# Patient Record
Sex: Female | Born: 1940 | Race: White | Hispanic: No | Marital: Married | Smoking: Never smoker
Health system: Southern US, Community
[De-identification: ages and names within clinical notes are randomized; demographics above are authoritative.]

## PROBLEM LIST (undated history)

## (undated) DIAGNOSIS — C801 Malignant (primary) neoplasm, unspecified: Secondary | ICD-10-CM

## (undated) DIAGNOSIS — H409 Unspecified glaucoma: Secondary | ICD-10-CM

## (undated) DIAGNOSIS — I639 Cerebral infarction, unspecified: Secondary | ICD-10-CM

## (undated) DIAGNOSIS — I10 Essential (primary) hypertension: Secondary | ICD-10-CM

## (undated) DIAGNOSIS — M8580 Other specified disorders of bone density and structure, unspecified site: Secondary | ICD-10-CM

## (undated) HISTORY — PX: TONSILLECTOMY: SUR1361

## (undated) HISTORY — DX: Other specified disorders of bone density and structure, unspecified site: M85.80

## (undated) HISTORY — PX: JOINT REPLACEMENT: SHX530

## (undated) HISTORY — PX: BREAST BIOPSY: SHX20

## (undated) HISTORY — DX: Unspecified glaucoma: H40.9

---

## 2004-01-24 ENCOUNTER — Ambulatory Visit: Payer: Self-pay | Admitting: Surgery

## 2006-08-05 ENCOUNTER — Other Ambulatory Visit: Payer: Self-pay

## 2006-08-06 ENCOUNTER — Inpatient Hospital Stay: Payer: Self-pay | Admitting: Internal Medicine

## 2007-07-14 ENCOUNTER — Ambulatory Visit: Payer: Self-pay | Admitting: Internal Medicine

## 2008-01-31 ENCOUNTER — Ambulatory Visit: Payer: Self-pay | Admitting: Gastroenterology

## 2009-01-11 ENCOUNTER — Ambulatory Visit: Payer: Self-pay | Admitting: Internal Medicine

## 2010-01-23 ENCOUNTER — Ambulatory Visit: Payer: Self-pay | Admitting: Internal Medicine

## 2011-02-27 ENCOUNTER — Ambulatory Visit: Payer: Self-pay | Admitting: Internal Medicine

## 2012-03-01 ENCOUNTER — Ambulatory Visit: Payer: Self-pay | Admitting: Internal Medicine

## 2013-03-02 ENCOUNTER — Ambulatory Visit: Payer: Self-pay | Admitting: Internal Medicine

## 2013-04-04 ENCOUNTER — Ambulatory Visit: Payer: Self-pay | Admitting: Gastroenterology

## 2014-03-03 ENCOUNTER — Ambulatory Visit: Payer: Self-pay | Admitting: Internal Medicine

## 2015-01-26 ENCOUNTER — Other Ambulatory Visit: Payer: Self-pay | Admitting: Internal Medicine

## 2015-01-26 DIAGNOSIS — Z1231 Encounter for screening mammogram for malignant neoplasm of breast: Secondary | ICD-10-CM

## 2015-03-05 ENCOUNTER — Ambulatory Visit
Admission: RE | Admit: 2015-03-05 | Discharge: 2015-03-05 | Disposition: A | Discharge status: Home / Self Care | Payer: Medicare Other | Source: Ambulatory Visit | Attending: Internal Medicine | Admitting: Internal Medicine

## 2015-03-05 ENCOUNTER — Other Ambulatory Visit: Payer: Self-pay | Admitting: Internal Medicine

## 2015-03-05 DIAGNOSIS — Z1231 Encounter for screening mammogram for malignant neoplasm of breast: Secondary | ICD-10-CM | POA: Diagnosis present

## 2015-07-26 DIAGNOSIS — Z8673 Personal history of transient ischemic attack (TIA), and cerebral infarction without residual deficits: Secondary | ICD-10-CM | POA: Insufficient documentation

## 2016-01-01 ENCOUNTER — Emergency Department: Payer: Medicare Other

## 2016-01-01 ENCOUNTER — Inpatient Hospital Stay
Admission: EM | Admit: 2016-01-01 | Discharge: 2016-01-05 | DRG: 470 | Disposition: A | Discharge status: Skilled Nursing Facility | Payer: Medicare Other | Attending: Internal Medicine | Admitting: Internal Medicine

## 2016-01-01 DIAGNOSIS — E876 Hypokalemia: Secondary | ICD-10-CM | POA: Diagnosis present

## 2016-01-01 DIAGNOSIS — S72002A Fracture of unspecified part of neck of left femur, initial encounter for closed fracture: Secondary | ICD-10-CM | POA: Diagnosis present

## 2016-01-01 DIAGNOSIS — Z96649 Presence of unspecified artificial hip joint: Secondary | ICD-10-CM

## 2016-01-01 DIAGNOSIS — W19XXXA Unspecified fall, initial encounter: Secondary | ICD-10-CM

## 2016-01-01 DIAGNOSIS — Z7982 Long term (current) use of aspirin: Secondary | ICD-10-CM

## 2016-01-01 DIAGNOSIS — Y9301 Activity, walking, marching and hiking: Secondary | ICD-10-CM | POA: Diagnosis present

## 2016-01-01 DIAGNOSIS — R262 Difficulty in walking, not elsewhere classified: Secondary | ICD-10-CM

## 2016-01-01 DIAGNOSIS — Y9289 Other specified places as the place of occurrence of the external cause: Secondary | ICD-10-CM | POA: Diagnosis not present

## 2016-01-01 DIAGNOSIS — M6281 Muscle weakness (generalized): Secondary | ICD-10-CM

## 2016-01-01 DIAGNOSIS — E785 Hyperlipidemia, unspecified: Secondary | ICD-10-CM | POA: Diagnosis present

## 2016-01-01 DIAGNOSIS — Z8673 Personal history of transient ischemic attack (TIA), and cerebral infarction without residual deficits: Secondary | ICD-10-CM | POA: Diagnosis not present

## 2016-01-01 DIAGNOSIS — W1830XA Fall on same level, unspecified, initial encounter: Secondary | ICD-10-CM | POA: Diagnosis present

## 2016-01-01 DIAGNOSIS — Z79899 Other long term (current) drug therapy: Secondary | ICD-10-CM | POA: Diagnosis not present

## 2016-01-01 DIAGNOSIS — M25552 Pain in left hip: Secondary | ICD-10-CM

## 2016-01-01 DIAGNOSIS — I10 Essential (primary) hypertension: Secondary | ICD-10-CM | POA: Diagnosis present

## 2016-01-01 HISTORY — DX: Cerebral infarction, unspecified: I63.9

## 2016-01-01 LAB — BASIC METABOLIC PANEL
Anion gap: 12 (ref 5–15)
BUN: 16 mg/dL (ref 6–20)
CO2: 26 mmol/L (ref 22–32)
Calcium: 9.4 mg/dL (ref 8.9–10.3)
Chloride: 100 mmol/L — ABNORMAL LOW (ref 101–111)
Creatinine, Ser: 0.67 mg/dL (ref 0.44–1.00)
Glucose, Bld: 127 mg/dL — ABNORMAL HIGH (ref 65–99)
POTASSIUM: 3 mmol/L — AB (ref 3.5–5.1)
SODIUM: 138 mmol/L (ref 135–145)

## 2016-01-01 LAB — CBC
HCT: 42.2 % (ref 35.0–47.0)
Hemoglobin: 14.4 g/dL (ref 12.0–16.0)
MCH: 32.2 pg (ref 26.0–34.0)
MCHC: 34.2 g/dL (ref 32.0–36.0)
MCV: 94.1 fL (ref 80.0–100.0)
PLATELETS: 207 10*3/uL (ref 150–440)
RBC: 4.49 MIL/uL (ref 3.80–5.20)
RDW: 12.8 % (ref 11.5–14.5)
WBC: 16.7 10*3/uL — AB (ref 3.6–11.0)

## 2016-01-01 LAB — CK: CK TOTAL: 84 U/L (ref 38–234)

## 2016-01-01 MED ORDER — ONDANSETRON HCL 4 MG/2ML IJ SOLN: 4.0000 mg | Freq: Four times a day (QID) | INTRAMUSCULAR | Status: DC | PRN

## 2016-01-01 MED ORDER — MORPHINE SULFATE (PF) 2 MG/ML IV SOLN: 2.0000 mg | INTRAVENOUS | Status: DC | PRN

## 2016-01-01 MED ORDER — AMLODIPINE BESYLATE 10 MG PO TABS
10.0000 mg | ORAL_TABLET | Freq: Every day | ORAL | Status: DC
  Administered 2016-01-02 – 2016-01-05 (×3): 10 mg via ORAL
  Filled 2016-01-01 (×4): qty 1

## 2016-01-01 MED ORDER — VITAMIN E 180 MG (400 UNIT) PO CAPS
400.0000 [IU] | ORAL_CAPSULE | Freq: Every day | ORAL | Status: DC
  Administered 2016-01-03 – 2016-01-05 (×3): 400 [IU] via ORAL
  Filled 2016-01-01 (×4): qty 1

## 2016-01-01 MED ORDER — ACETAMINOPHEN 650 MG RE SUPP: 650.0000 mg | Freq: Four times a day (QID) | RECTAL | Status: DC | PRN

## 2016-01-01 MED ORDER — PRAVASTATIN SODIUM 20 MG PO TABS
20.0000 mg | ORAL_TABLET | Freq: Every day | ORAL | Status: DC
  Administered 2016-01-02 – 2016-01-05 (×4): 20 mg via ORAL
  Filled 2016-01-01 (×4): qty 1

## 2016-01-01 MED ORDER — ZOLPIDEM TARTRATE 5 MG PO TABS: 5.0000 mg | ORAL_TABLET | Freq: Every evening | ORAL | Status: DC | PRN

## 2016-01-01 MED ORDER — HYDROCHLOROTHIAZIDE 25 MG PO TABS
25.0000 mg | ORAL_TABLET | Freq: Every day | ORAL | Status: DC
  Administered 2016-01-02 – 2016-01-05 (×4): 25 mg via ORAL
  Filled 2016-01-01 (×4): qty 1

## 2016-01-01 MED ORDER — SODIUM CHLORIDE 0.9 % IV SOLN
Freq: Once | INTRAVENOUS | Status: AC
  Administered 2016-01-01: 22:00:00 via INTRAVENOUS

## 2016-01-01 MED ORDER — TIMOLOL HEMIHYDRATE 0.25 % OP SOLN
1.0000 [drp] | Freq: Every day | OPHTHALMIC | Status: DC
  Administered 2016-01-02: 1 [drp] via OPHTHALMIC
  Filled 2016-01-01 (×2): qty 5

## 2016-01-01 MED ORDER — OXYCODONE HCL 5 MG PO TABS: 5.0000 mg | ORAL_TABLET | ORAL | Status: DC | PRN

## 2016-01-01 MED ORDER — FENTANYL CITRATE (PF) 100 MCG/2ML IJ SOLN
50.0000 ug | Freq: Once | INTRAMUSCULAR | Status: AC
  Administered 2016-01-01: 50 ug via INTRAVENOUS
  Filled 2016-01-01: qty 2

## 2016-01-01 MED ORDER — MORPHINE SULFATE (PF) 2 MG/ML IV SOLN
2.0000 mg | INTRAVENOUS | Status: DC | PRN
  Administered 2016-01-01 – 2016-01-02 (×4): 2 mg via INTRAVENOUS
  Filled 2016-01-01 (×4): qty 1

## 2016-01-01 MED ORDER — POTASSIUM CHLORIDE IN NACL 40-0.9 MEQ/L-% IV SOLN
INTRAVENOUS | Status: DC
  Administered 2016-01-01 – 2016-01-02 (×2): 75 mL/h via INTRAVENOUS
  Filled 2016-01-01 (×4): qty 1000

## 2016-01-01 MED ORDER — COQ10 100 MG PO CAPS: 100.0000 mg | ORAL_CAPSULE | Freq: Every day | ORAL | Status: DC

## 2016-01-01 MED ORDER — SENNOSIDES-DOCUSATE SODIUM 8.6-50 MG PO TABS: 1.0000 | ORAL_TABLET | Freq: Every evening | ORAL | Status: DC | PRN

## 2016-01-01 MED ORDER — ADULT MULTIVITAMIN W/MINERALS CH
1.0000 | ORAL_TABLET | Freq: Every day | ORAL | Status: DC
  Administered 2016-01-03 – 2016-01-05 (×3): 1 via ORAL
  Filled 2016-01-01 (×4): qty 1

## 2016-01-01 MED ORDER — MORPHINE SULFATE (PF) 2 MG/ML IV SOLN
INTRAVENOUS | Status: AC
  Administered 2016-01-02: 2 mg via INTRAVENOUS
  Filled 2016-01-01: qty 1

## 2016-01-01 MED ORDER — POTASSIUM CHLORIDE CRYS ER 20 MEQ PO TBCR
40.0000 meq | EXTENDED_RELEASE_TABLET | Freq: Once | ORAL | Status: AC
  Administered 2016-01-01: 40 meq via ORAL
  Filled 2016-01-01: qty 2

## 2016-01-01 MED ORDER — BISACODYL 5 MG PO TBEC: 5.0000 mg | DELAYED_RELEASE_TABLET | Freq: Every day | ORAL | Status: DC | PRN

## 2016-01-01 MED ORDER — LATANOPROST 0.005 % OP SOLN
1.0000 [drp] | Freq: Every day | OPHTHALMIC | Status: DC
  Administered 2016-01-01 – 2016-01-04 (×3): 1 [drp] via OPHTHALMIC
  Filled 2016-01-01 (×2): qty 2.5

## 2016-01-01 MED ORDER — HYDROCODONE-ACETAMINOPHEN 5-325 MG PO TABS: 1.0000 | ORAL_TABLET | ORAL | Status: DC | PRN

## 2016-01-01 MED ORDER — ACETAMINOPHEN 325 MG PO TABS: 650.0000 mg | ORAL_TABLET | Freq: Four times a day (QID) | ORAL | Status: DC | PRN

## 2016-01-01 MED ORDER — HEPARIN SODIUM (PORCINE) 5000 UNIT/ML IJ SOLN: 5000.0000 [IU] | Freq: Three times a day (TID) | INTRAMUSCULAR | Status: DC

## 2016-01-01 MED ORDER — ONDANSETRON HCL 4 MG PO TABS: 4.0000 mg | ORAL_TABLET | Freq: Four times a day (QID) | ORAL | Status: DC | PRN

## 2016-01-01 MED ORDER — CEFAZOLIN SODIUM-DEXTROSE 2-4 GM/100ML-% IV SOLN
2.0000 g | INTRAVENOUS | Status: AC
  Administered 2016-01-02: 2 g via INTRAVENOUS
  Filled 2016-01-01: qty 100

## 2016-01-01 MED ORDER — MAGNESIUM CITRATE PO SOLN
1.0000 | Freq: Once | ORAL | Status: DC | PRN
  Filled 2016-01-01: qty 296

## 2016-01-01 NOTE — Consult Note (Signed)
ORTHOPAEDIC CONSULTATION  PATIENT NAME: Melissa [name] DOB: 05-19-1940  MRN: [id number]  REQUESTING PHYSICIAN: No att. providers found  Chief Complaint: Left hip pain  HPI: Melissa [name] is a 75 y.o. female who complains of  left hip pain. The patient is moving flowers into her garage when she slipped and fell, landing on her left hip and side. She was unable stand or bear weight due to the left hip pain. She denied any loss of consciousness. She denied any other injuries. She was apparently on the floor of the broad she for several hours before being found by her husband. Prior to the fall she was an independent ambulator.  Past Medical History:  Diagnosis Date  . Stroke [facility])    Past Surgical History:  Procedure Laterality Date  . BREAST BIOPSY Right    core- stereo- neg   Social History   Social History  . Marital status: Married    Spouse name: N/A  . Number of children: N/A  . Years of education: N/A   Social History Main Topics  . Smoking status: Never Smoker  . Smokeless tobacco: Never Used  . Alcohol use No  . Drug use: Unknown  . Sexual activity: Not Asked   Other Topics Concern  . None   Social History Narrative  . None   Family History  Problem Relation Age of Onset  . Breast cancer Maternal Aunt    No Known Allergies Prior to Admission medications   Medication Sig Start Date End Date Taking? Authorizing Provider  amLODipine (NORVASC) 10 MG tablet Take 10 mg by mouth daily.   Yes Historical Provider, MD  aspirin EC 325 MG tablet Take 325 mg by mouth daily.   Yes Historical Provider, MD  Coenzyme Q10 (COQ10) 100 MG CAPS Take 100 mg by mouth daily.   Yes Historical Provider, MD  hydrochlorothiazide (HYDRODIURIL) 25 MG tablet Take 25 mg by mouth daily.   Yes Historical Provider, MD  latanoprost (XALATAN) 0.005 % ophthalmic solution Place 1 drop into both eyes at bedtime.   Yes Historical Provider, MD  Multiple Vitamin (MULTIVITAMIN) tablet Take 1  tablet by mouth daily.   Yes Historical Provider, MD  pravastatin (PRAVACHOL) 20 MG tablet Take 20 mg by mouth daily.   Yes Historical Provider, MD  timolol (BETIMOL) 0.25 % ophthalmic solution Place 1 drop into both eyes daily.   Yes Historical Provider, MD  triamcinolone cream (KENALOG) 0.5 % Apply 1 application topically 2 (two) times daily.   Yes Historical Provider, MD  vitamin E 400 UNIT capsule Take 400 Units by mouth daily.   Yes Historical Provider, MD   Ct Head Wo Contrast  Result Date: 01/01/2016 CLINICAL DATA:  Status post fall. EXAM: CT HEAD WITHOUT CONTRAST CT CERVICAL SPINE WITHOUT CONTRAST TECHNIQUE: Multidetector CT imaging of the head and cervical spine was performed following the standard protocol without intravenous contrast. Multiplanar CT image reconstructions of the cervical spine were also generated. COMPARISON:  Brain MRI 08/06/2006 FINDINGS: CT HEAD FINDINGS Brain: No mass lesion, intraparenchymal hemorrhage or extra-axial collection. No evidence of acute cortical infarct. 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 Vascular: No hyperdense vessel or unexpected calcification. Skull: Normal visualized skull base, calvarium and extracranial soft tissues. Sinuses/Orbits: No sinus fluid levels or advanced mucosal thickening. No mastoid effusion. Normal orbits. CT CERVICAL SPINE FINDINGS Alignment: No static subluxation. Facets are aligned. Occipital condyles are normally positioned. Skull base and vertebrae: No acute fracture. Soft tissues and spinal canal: No prevertebral fluid  or swelling. No visible canal hematoma. Disc levels: No advanced spinal canal stenosis. There is multilevel severe facet hypertrophy without obvious severe foraminal stenosis. Upper chest: No pneumothorax, pulmonary nodule or pleural effusion. Other: Normal visualized paraspinal cervical soft tissues. IMPRESSION: 1. No acute intracranial abnormality. Old left thalamic lacunar infarct. 2. No acute fracture or static  subluxation of the cervical spine. Electronically Signed   By: Ulyses Jarred M.D.   On: 01/01/2016 20:59   Ct Cervical Spine Wo Contrast  Result Date: 01/01/2016 CLINICAL DATA:  Status post fall. EXAM: CT HEAD WITHOUT CONTRAST CT CERVICAL SPINE WITHOUT CONTRAST TECHNIQUE: Multidetector CT imaging of the head and cervical spine was performed following the standard protocol without intravenous contrast. Multiplanar CT image reconstructions of the cervical spine were also generated. COMPARISON:  Brain MRI 08/06/2006 FINDINGS: CT HEAD FINDINGS Brain: No mass lesion, intraparenchymal hemorrhage or extra-axial collection. No evidence of acute cortical infarct. 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 Vascular: No hyperdense vessel or unexpected calcification. Skull: Normal visualized skull base, calvarium and extracranial soft tissues. Sinuses/Orbits: No sinus fluid levels or advanced mucosal thickening. No mastoid effusion. Normal orbits. CT CERVICAL SPINE FINDINGS Alignment: No static subluxation. Facets are aligned. Occipital condyles are normally positioned. Skull base and vertebrae: No acute fracture. Soft tissues and spinal canal: No prevertebral fluid or swelling. No visible canal hematoma. Disc levels: No advanced spinal canal stenosis. There is multilevel severe facet hypertrophy without obvious severe foraminal stenosis. Upper chest: No pneumothorax, pulmonary nodule or pleural effusion. Other: Normal visualized paraspinal cervical soft tissues. IMPRESSION: 1. No acute intracranial abnormality. Old left thalamic lacunar infarct. 2. No acute fracture or static subluxation of the cervical spine. Electronically Signed   By: Ulyses Jarred M.D.   On: 01/01/2016 20:59   Dg Chest Portable 1 View  Result Date: 01/01/2016 CLINICAL DATA:  [place] with left proximal femur pain. Left hip fracture. EXAM: PORTABLE CHEST 1 VIEW COMPARISON:  08/05/2006 FINDINGS: Both lungs are clear. Heart and mediastinum are within normal  limits. Trachea is midline. Negative for a pneumothorax. No acute bone abnormality in the chest. IMPRESSION: No acute chest abnormality. Electronically Signed   By: Markus Daft M.D.   On: 01/01/2016 20:53   Dg Hip Unilat W Or Wo Pelvis 2-3 Views Left  Result Date: 01/01/2016 CLINICAL DATA:  Status post fall EXAM: DG HIP (WITH OR WITHOUT PELVIS) 2-3V LEFT COMPARISON:  None. FINDINGS: There is a fracture of the left femoral neck with superior displacement of the femur relative to the femoral head. The femoral head remains within the acetabular cup. The remainder of the visualized pelvis is unremarkable. IMPRESSION: Acute fracture of the left femoral neck with approximately 2 cm of overriding. Electronically Signed   By: Ulyses Jarred M.D.   On: 01/01/2016 21:05    Positive ROS: All other systems have been reviewed and were otherwise negative with the exception of those mentioned in the HPI and as above.  Physical Exam: General: Alert and alert in no acute distress. HEENT: Atraumatic and normocephalic. Sclera are clear. Extraocular motion is intact. Oropharynx is clear with moist mucosa. Neck: Supple, nontender, good range of motion. No JVD or carotid bruits. Lungs: Clear to auscultation bilaterally. Cardiovascular: Regular rate and rhythm with normal S1 and S2. No murmurs. No gallops or rubs. Pedal pulses are palpable bilaterally. Homans test is negative bilaterally. No significant pretibial or ankle edema. Abdomen: Soft, nontender, and nondistended. Bowel sounds are present. Skin: No lesions in the area of chief complaint Neurologic:  Awake, alert, and oriented. Sensory function is grossly intact. Motor strength is felt to be 5 over 5 bilaterally. No clonus or tremor. Good motor coordination. Lymphatic: No axillary or cervical lymphadenopathy  MUSCULOSKELETAL: Semination the upper extremities demonstrates no focal tenderness to palpation, good range of motion shoulders, elbows, and wrist. Good  upper body strength is noted. Pertinent examination of the lower extremities so for shortening and rotation of the left lower extremity. Hip pain is reproduced by any attempt at range of motion of the left hip. No point tenderness or effusion of the left knee.  Assessment: Displaced left femoral neck fracture Hypokalemia  Plan: Recommendations made for left hip hemiarthroplasty. The findings were discussed in detail with the patient. The usual perioperative course was discussed. The risks and benefits of surgical intervention were reviewed. The patient expressed understanding of the risks and benefits and agreed with plans for surgical intervention.   The surgical site was signed as per the "right site surgery" protocol.   Optimization for surgery as per Medicine.  James P. Holley Bouche M.D.

## 2016-01-01 NOTE — ED Notes (Signed)
Pt arrived via ems with c/o fall - pt was in garage bringing flowers in - pt states she lost her balance and fell and has been laying on the garage floor since 4pm - c/o left leg pain - left leg is noted to be slightly shortened and rotated outwards

## 2016-01-01 NOTE — Progress Notes (Signed)
PHARMACIST - PHYSICIAN ORDER COMMUNICATION  CONCERNING: P&T Medication Policy on Herbal Medications  DESCRIPTION:  This patient's order for:  Co-Q-10  has been noted.  This product(s) is classified as an "herbal" or natural product. Due to a lack of definitive safety studies or FDA approval, nonstandard manufacturing practices, plus the potential risk of unknown drug-drug interactions while on inpatient medications, the Pharmacy and Therapeutics Committee does not permit the use of "herbal" or natural products of this type within [facility].   ACTION TAKEN: The pharmacy department is unable to verify this order at this time. Please reevaluate patient's clinical condition at discharge and address if the herbal or natural product(s) should be resumed at that time.   

## 2016-01-01 NOTE — ED Notes (Signed)
Transporting patient to 144-1A

## 2016-01-01 NOTE — ED Triage Notes (Signed)
Pt arrived via ems with c/o fall - pt was in garage bringing flowers in - pt states she lost her balance and fell and has been laying on the garage floor since 4pm - c/o left leg pain - left leg is noted to be slightly shortened and rotated outwards

## 2016-01-01 NOTE — ED Notes (Signed)
37F foley inserted without difficulty - foley adhesive device in place to hold foley in place

## 2016-01-01 NOTE — ED Provider Notes (Signed)
[facility] Emergency Department Provider Note  ____________________________________________  Time seen: Approximately 7:59 PM  I have reviewed the triage vital signs and the nursing notes.   HISTORY  Chief Complaint Fall   HPI Melissa [name] is a 75 y.o. female with a history of stroke on aspirin only, hypertension, hyperlipidemia who presents for evaluation of fall. Patient was bringing flowers from her driveway into her garage and she lost her balance and fell. She fell to her left side. Patient developed severe pain in her left proximal femur and was unable to stand up. Patient reports that she was on the ground for 3 hours and to her husband found her. She denies head trauma but is unsure if she passed out after the fall. Patient is complaining of severe left-sided hip pain worse with movement. She denies headache, neck pain, back pain, chest pain, shortness of breath, abdominal pain.  Past Medical History:  Diagnosis Date  . Stroke [facility])     Patient Active Problem List   Diagnosis Date Noted  . Closed left hip fracture ([place]) 01/01/2016    Past Surgical History:  Procedure Laterality Date  . BREAST BIOPSY Right    core- stereo- neg    Prior to Admission medications   Medication Sig Start Date End Date Taking? Authorizing Provider  amLODipine (NORVASC) 10 MG tablet Take 10 mg by mouth daily.   Yes Historical Provider, MD  aspirin EC 325 MG tablet Take 325 mg by mouth daily.   Yes Historical Provider, MD  Coenzyme Q10 (COQ10) 100 MG CAPS Take 100 mg by mouth daily.   Yes Historical Provider, MD  hydrochlorothiazide (HYDRODIURIL) 25 MG tablet Take 25 mg by mouth daily.   Yes Historical Provider, MD  latanoprost (XALATAN) 0.005 % ophthalmic solution Place 1 drop into both eyes at bedtime.   Yes Historical Provider, MD  Multiple Vitamin (MULTIVITAMIN) tablet Take 1 tablet by mouth daily.   Yes Historical Provider, MD  pravastatin (PRAVACHOL) 20  MG tablet Take 20 mg by mouth daily.   Yes Historical Provider, MD  timolol (BETIMOL) 0.25 % ophthalmic solution Place 1 drop into both eyes daily.   Yes Historical Provider, MD  triamcinolone cream (KENALOG) 0.5 % Apply 1 application topically 2 (two) times daily.   Yes Historical Provider, MD  vitamin E 400 UNIT capsule Take 400 Units by mouth daily.   Yes Historical Provider, MD    Allergies Review of patient's allergies indicates no known allergies.  Family History  Problem Relation Age of Onset  . Breast cancer Maternal Aunt     Social History Social History  Substance Use Topics  . Smoking status: Never Smoker  . Smokeless tobacco: Never Used  . Alcohol use No    Review of Systems Constitutional: Negative for fever. Eyes: Negative for visual changes. ENT: Negative for facial injury or neck injury Cardiovascular: Negative for chest injury. Respiratory: Negative for shortness of breath. Negative for chest wall injury. Gastrointestinal: Negative for abdominal pain or injury. Genitourinary: Negative for dysuria. Musculoskeletal: Negative for back injury, + Left leg pain Skin: Negative for laceration/abrasions. Neurological: Negative for head injury.   ____________________________________________   PHYSICAL EXAM:  VITAL SIGNS: ED Triage Vitals [01/01/16 1945]  Enc Vitals Group     BP (!) 153/75     Pulse Rate 74     Resp 16     Temp 98 F (36.7 C)     Temp src      SpO2  100 %     Weight 156 lb (70.8 kg)     Height 5\' 2"  (1.575 m)     Head Circumference      Peak Flow      Pain Score 9     Pain Loc      Pain Edu?      Excl. in [place]?    Full spinal precautions maintained throughout the trauma exam. Constitutional: Alert and oriented. No acute distress. Does not appear intoxicated. HEENT Head: Normocephalic and atraumatic. Face: No facial bony tenderness. Stable midface Ears: No hemotympanum bilaterally. No Battle sign Eyes: No eye injury. PERRL. No  raccoon eyes Nose: Nontender. No epistaxis. No rhinorrhea Mouth/Throat: Mucous membranes are moist. No oropharyngeal blood. No dental injury. Airway patent without stridor. Normal voice. Neck: C-collar in place. No midline c-spine tenderness.  Cardiovascular: Normal rate, regular rhythm. Normal and symmetric distal pulses are present in all extremities. Pulmonary/Chest: Chest wall is stable and nontender to palpation/compression. Normal respiratory effort. Breath sounds are normal. No crepitus.  Abdominal: Soft, nontender, non distended. Musculoskeletal: Left leg is shortened and externally rotated. No thoracic or lumbar midline spinal tenderness. Pelvis is stable. Skin: Skin is warm, dry and intact. No abrasions or contutions. Psychiatric: Speech and behavior are appropriate. Neurological: Normal speech and language. Moves all extremities to command. No gross focal neurologic deficits are appreciated.  Glascow Coma Score: 4 - Opens eyes on own 6 - Follows simple motor commands 5 - Alert and oriented GCS: 15  ____________________________________________   LABS (all labs ordered are listed, but only abnormal results are displayed)  Labs Reviewed  BASIC METABOLIC PANEL - Abnormal; Notable for the following:       Result Value   Potassium 3.0 (*)    Chloride 100 (*)    Glucose, Bld 127 (*)    All other components within normal limits  SURGICAL PCR SCREEN  CK  BASIC METABOLIC PANEL  CBC  CBC  TYPE AND SCREEN   ____________________________________________  EKG  none ____________________________________________  RADIOLOGY  CT head c-spine: 1. No acute intracranial abnormality. Old left thalamic lacunar Infarct. 2. No acute fracture or static subluxation of the cervical spine.  XR L hip: Acute fracture of the left femoral neck with approximately 2 cm of Overriding.  CXR: No acute chest  abnormality ____________________________________________   PROCEDURES  Procedure(s) performed: None Procedures Critical Care performed:  None ____________________________________________   INITIAL IMPRESSION / ASSESSMENT AND PLAN / ED COURSE  75 y.o. female with a history of stroke on aspirin only, hypertension, hyperlipidemia who presents for evaluation of Left hip/leg pain after mechanical fall. Questionable LOC. Patient with a shortened and externally rotated leg concerning for possible fracture. We'll also check a BMP and total CK is patient was on the floor for 3 hours. We'll give her set no for the pain. Will pursue head CT and CT C-spine.  Clinical Course  Comment By Time  Patient found to have hip fracture. Patient will be admitted to the hospitalist. Discussed case with Dr. Marry Guan.  Rudene Re, MD 10/17 2150    Pertinent labs & imaging results that were available during my care of the patient were reviewed by me and considered in my medical decision making (see chart for details).    ____________________________________________   FINAL CLINICAL IMPRESSION(S) / ED DIAGNOSES  Final diagnoses:  Closed fracture of left hip, initial encounter ([place])  Fall, initial encounter      NEW MEDICATIONS STARTED DURING THIS VISIT:  Current Discharge Medication List       Note:  This document was prepared using Dragon voice recognition software and may include unintentional dictation errors.    Rudene Re, MD 01/01/16 2322

## 2016-01-02 ENCOUNTER — Inpatient Hospital Stay: Payer: Medicare Other | Admitting: Anesthesiology

## 2016-01-02 ENCOUNTER — Encounter
Admission: EM | Disposition: A | Discharge status: Skilled Nursing Facility | Payer: Self-pay | Source: Home / Self Care | Attending: Internal Medicine

## 2016-01-02 ENCOUNTER — Encounter: Payer: Self-pay | Admitting: Anesthesiology

## 2016-01-02 HISTORY — PX: HIP ARTHROPLASTY: SHX981

## 2016-01-02 LAB — LIPID PANEL
CHOLESTEROL: 178 mg/dL (ref 0–200)
HDL: 48 mg/dL (ref >40)
LDL CALC: 97 mg/dL (ref 0–99)
TRIGLYCERIDES: 166 mg/dL — AB (ref <150)
Total CHOL/HDL Ratio: 3.7 RATIO
VLDL: 33 mg/dL (ref 0–40)

## 2016-01-02 LAB — BASIC METABOLIC PANEL
Anion gap: 8 (ref 5–15)
BUN: 14 mg/dL (ref 6–20)
CALCIUM: 9.1 mg/dL (ref 8.9–10.3)
CO2: 28 mmol/L (ref 22–32)
CREATININE: 0.53 mg/dL (ref 0.44–1.00)
Chloride: 104 mmol/L (ref 101–111)
Glucose, Bld: 126 mg/dL — ABNORMAL HIGH (ref 65–99)
Potassium: 3.6 mmol/L (ref 3.5–5.1)
SODIUM: 140 mmol/L (ref 135–145)

## 2016-01-02 LAB — CBC
HCT: 41.7 % (ref 35.0–47.0)
Hemoglobin: 14.4 g/dL (ref 12.0–16.0)
MCH: 32.7 pg (ref 26.0–34.0)
MCHC: 34.4 g/dL (ref 32.0–36.0)
MCV: 94.8 fL (ref 80.0–100.0)
PLATELETS: 204 10*3/uL (ref 150–440)
RBC: 4.4 MIL/uL (ref 3.80–5.20)
RDW: 12.7 % (ref 11.5–14.5)
WBC: 12.7 10*3/uL — AB (ref 3.6–11.0)

## 2016-01-02 LAB — SURGICAL PCR SCREEN
MRSA, PCR: NEGATIVE
Staphylococcus aureus: NEGATIVE

## 2016-01-02 SURGERY — HEMIARTHROPLASTY, HIP, DIRECT ANTERIOR APPROACH, FOR FRACTURE
Anesthesia: Spinal | Site: Hip | Laterality: Left | Wound class: Clean

## 2016-01-02 MED ORDER — ACETAMINOPHEN 10 MG/ML IV SOLN
INTRAVENOUS | Status: AC
  Filled 2016-01-02: qty 100

## 2016-01-02 MED ORDER — MIDAZOLAM HCL 5 MG/5ML IJ SOLN
INTRAMUSCULAR | Status: DC | PRN
  Administered 2016-01-02: 2 mg via INTRAVENOUS

## 2016-01-02 MED ORDER — PHENYLEPHRINE HCL 10 MG/ML IJ SOLN
INTRAMUSCULAR | Status: AC
  Filled 2016-01-02: qty 1

## 2016-01-02 MED ORDER — TETRACAINE HCL 1 % IJ SOLN
INTRAMUSCULAR | Status: DC | PRN
  Administered 2016-01-02: 10 mg via INTRASPINAL

## 2016-01-02 MED ORDER — NEOMYCIN-POLYMYXIN B GU 40-200000 IR SOLN
Status: AC
  Filled 2016-01-02: qty 20

## 2016-01-02 MED ORDER — SODIUM CHLORIDE 0.9 % IV SOLN
INTRAVENOUS | Status: DC | PRN
  Administered 2016-01-02: 0.333 ug/min via INTRAVENOUS

## 2016-01-02 MED ORDER — EPHEDRINE SULFATE 50 MG/ML IJ SOLN
INTRAMUSCULAR | Status: DC | PRN
  Administered 2016-01-02: 10 mg via INTRAVENOUS

## 2016-01-02 MED ORDER — NEOMYCIN-POLYMYXIN B GU 40-200000 IR SOLN
Status: DC | PRN
  Administered 2016-01-02: 16 mL

## 2016-01-02 MED ORDER — PROPOFOL 500 MG/50ML IV EMUL
INTRAVENOUS | Status: DC | PRN
  Administered 2016-01-02: 25 ug/kg/min via INTRAVENOUS

## 2016-01-02 MED ORDER — KETAMINE HCL 50 MG/ML IJ SOLN
INTRAMUSCULAR | Status: DC | PRN
  Administered 2016-01-02 (×2): 25 mg via INTRAMUSCULAR

## 2016-01-02 MED ORDER — PROPOFOL 10 MG/ML IV BOLUS
INTRAVENOUS | Status: DC | PRN
  Administered 2016-01-02 – 2016-01-03 (×2): 30 mg via INTRAVENOUS

## 2016-01-02 MED ORDER — BUPIVACAINE HCL (PF) 0.5 % IJ SOLN
INTRAMUSCULAR | Status: DC | PRN
  Administered 2016-01-02: 2 mL

## 2016-01-02 MED ORDER — TETRACAINE HCL 1 % IJ SOLN
INTRAMUSCULAR | Status: AC
  Filled 2016-01-02: qty 2

## 2016-01-02 MED ORDER — LACTATED RINGERS IV SOLN
INTRAVENOUS | Status: DC | PRN
  Administered 2016-01-02: 21:00:00 via INTRAVENOUS

## 2016-01-02 MED ORDER — POTASSIUM CHLORIDE CRYS ER 20 MEQ PO TBCR
40.0000 meq | EXTENDED_RELEASE_TABLET | Freq: Two times a day (BID) | ORAL | Status: AC
  Administered 2016-01-02 (×2): 40 meq via ORAL
  Filled 2016-01-02 (×2): qty 2

## 2016-01-02 SURGICAL SUPPLY — 57 items
BAG DECANTER FOR FLEXI CONT (MISCELLANEOUS) ×3 IMPLANT
BLADE SAW 1 (BLADE) ×3 IMPLANT
CANISTER SUCT 1200ML W/VALVE (MISCELLANEOUS) ×3 IMPLANT
CANISTER SUCT 3000ML (MISCELLANEOUS) ×6 IMPLANT
CAPT HIP HEMI 2 ×3 IMPLANT
CATH FOL LEG HOLDER (MISCELLANEOUS) IMPLANT
CEMENT HV SMART SET (Cement) ×6 IMPLANT
DRAPE INCISE IOBAN 66X60 STRL (DRAPES) ×3 IMPLANT
DRAPE SHEET LG 3/4 BI-LAMINATE (DRAPES) ×3 IMPLANT
DRAPE TABLE BACK 80X90 (DRAPES) ×3 IMPLANT
DRSG DERMACEA 8X12 NADH (GAUZE/BANDAGES/DRESSINGS) ×3 IMPLANT
DRSG OPSITE POSTOP 4X12 (GAUZE/BANDAGES/DRESSINGS) ×3 IMPLANT
DRSG OPSITE POSTOP 4X14 (GAUZE/BANDAGES/DRESSINGS) IMPLANT
DRSG TEGADERM 4X4.75 (GAUZE/BANDAGES/DRESSINGS) ×3 IMPLANT
DURAPREP 26ML APPLICATOR (WOUND CARE) ×6 IMPLANT
ELECT BLADE 6.5 EXT (BLADE) ×3 IMPLANT
ELECT CAUTERY BLADE 6.4 (BLADE) ×3 IMPLANT
ELECT REM PT RETURN 9FT ADLT (ELECTROSURGICAL) ×3
ELECTRODE REM PT RTRN 9FT ADLT (ELECTROSURGICAL) ×1 IMPLANT
GAUZE PACK 2X3YD (MISCELLANEOUS) ×3 IMPLANT
GLOVE BIO SURGEON STRL SZ8 (GLOVE) ×3 IMPLANT
GLOVE BIOGEL M STRL SZ7.5 (GLOVE) ×3 IMPLANT
GLOVE BIOGEL PI IND STRL 9 (GLOVE) IMPLANT
GLOVE BIOGEL PI INDICATOR 9 (GLOVE)
GLOVE INDICATOR 8.0 STRL GRN (GLOVE) ×6 IMPLANT
GOWN STRL REUS W/ TWL LRG LVL3 (GOWN DISPOSABLE) ×1 IMPLANT
GOWN STRL REUS W/TWL 2XL LVL3 (GOWN DISPOSABLE) ×3 IMPLANT
GOWN STRL REUS W/TWL LRG LVL3 (GOWN DISPOSABLE) ×2
HANDPIECE INTERPULSE COAX TIP (DISPOSABLE) ×2
HEMOVAC 400CC 10FR (MISCELLANEOUS) ×3 IMPLANT
HOOD PEEL AWAY FLYTE STAYCOOL (MISCELLANEOUS) ×3 IMPLANT
IV NS 100ML SINGLE PACK (IV SOLUTION) ×3 IMPLANT
KIT RM TURNOVER STRD PROC AR (KITS) ×3 IMPLANT
NDL SAFETY 18GX1.5 (NEEDLE) ×3 IMPLANT
NEEDLE FILTER BLUNT 18X 1/2SAF (NEEDLE) ×2
NEEDLE FILTER BLUNT 18X1 1/2 (NEEDLE) ×1 IMPLANT
NS IRRIG 1000ML POUR BTL (IV SOLUTION) ×3 IMPLANT
PACK HIP PROSTHESIS (MISCELLANEOUS) ×3 IMPLANT
PRESSURIZER CEMENT PROX FEM SM (MISCELLANEOUS) ×3 IMPLANT
PRESSURIZER FEM CANAL M (MISCELLANEOUS) ×3 IMPLANT
SET HNDPC FAN SPRY TIP SCT (DISPOSABLE) ×1 IMPLANT
SOL .9 NS 3000ML IRR  AL (IV SOLUTION) ×2
SOL .9 NS 3000ML IRR UROMATIC (IV SOLUTION) ×1 IMPLANT
SOL PREP PVP 2OZ (MISCELLANEOUS) ×3
SOLUTION PREP PVP 2OZ (MISCELLANEOUS) ×1 IMPLANT
SPONGE DRAIN TRACH 4X4 STRL 2S (GAUZE/BANDAGES/DRESSINGS) ×3 IMPLANT
STAPLER SKIN PROX 35W (STAPLE) ×3 IMPLANT
SUT ETHIBOND #5 BRAIDED 30INL (SUTURE) ×3 IMPLANT
SUT VIC AB 0 CT1 36 (SUTURE) ×3 IMPLANT
SUT VIC AB 1 CT1 36 (SUTURE) ×6 IMPLANT
SUT VIC AB 2-0 CT1 27 (SUTURE) ×2
SUT VIC AB 2-0 CT1 TAPERPNT 27 (SUTURE) ×1 IMPLANT
SYR 20CC LL (SYRINGE) ×3 IMPLANT
SYR TB 1ML LUER SLIP (SYRINGE) ×3 IMPLANT
TAPE TRANSPORE STRL 2 31045 (GAUZE/BANDAGES/DRESSINGS) ×3 IMPLANT
TIP COAXIAL FEMORAL CANAL (MISCELLANEOUS) ×3 IMPLANT
TOWER CARTRIDGE SMART MIX (DISPOSABLE) ×3 IMPLANT

## 2016-01-02 NOTE — Progress Notes (Signed)
CSW received consult for possible SNF placement. CSW is awaiting PT evaluation to be completed to determine the appropriate level of care needed for patient. CSW will continue to follow and assist.  Ernest Pine, MSW, LCSW, [place] Social Worker [phone]

## 2016-01-02 NOTE — Anesthesia Preprocedure Evaluation (Signed)
Anesthesia Evaluation  Patient identified by MRN, date of birth, ID band Patient awake    Reviewed: Allergy & Precautions, H&P , NPO status , Patient's Chart, lab work & pertinent test results, reviewed documented beta blocker date and time   History of Anesthesia Complications Negative for: history of anesthetic complications  Airway Mallampati: III  TM Distance: >3 FB Neck ROM: full    Dental no notable dental hx. (+) Teeth Intact   Pulmonary neg pulmonary ROS,           Cardiovascular Exercise Tolerance: Good hypertension, (-) angina(-) CAD, (-) Past MI, (-) Cardiac Stents and (-) CABG (-) dysrhythmias (-) Valvular Problems/Murmurs     Neuro/Psych neg Seizures CVA negative psych ROS   GI/Hepatic negative GI ROS, Neg liver ROS,   Endo/Other  negative endocrine ROS  Renal/GU negative Renal ROS  negative genitourinary   Musculoskeletal   Abdominal   Peds  Hematology negative hematology ROS (+)   Anesthesia Other Findings Past Medical History: No date: Stroke ([place])   Reproductive/Obstetrics negative OB ROS                             Anesthesia Physical Anesthesia Plan  ASA: II  Anesthesia Plan: Spinal   Post-op Pain Management:    Induction:   Airway Management Planned:   Additional Equipment:   Intra-op Plan:   Post-operative Plan:   Informed Consent: I have reviewed the patients History and Physical, chart, labs and discussed the procedure including the risks, benefits and alternatives for the proposed anesthesia with the patient or authorized representative who has indicated his/her understanding and acceptance.   Dental Advisory Given  Plan Discussed with: Anesthesiologist, CRNA and Surgeon  Anesthesia Plan Comments:         Anesthesia Quick Evaluation

## 2016-01-02 NOTE — Care Management Note (Signed)
Case Management Note  Patient Details  Name: ARNITRA SOKOLOSKI MRN: [id number] Date of Birth: Nov 09, 1940  Subjective/Objective:                  Met with patient prior to surgery today with Dr. Marry Guan. She would like to return home with her husband at discharge. She is from home where she was independent caring for her 73 y/o mother and her 4 y/o husband. She is not concerned about returning home. She states her daughter is very supportive too. Her PCP is Dr. Ramonita Lab. She will need a rolling walker. She uses [facility] on [address] for medications. Her husband has used [facility] in the past for home health.  Action/Plan:   List of home health agencies left with patient. RNCM will continue to follow.   Expected Discharge Date:                  Expected Discharge Plan:     In-House Referral:     Discharge planning Services  CM Consult  Post Acute Care Choice:  Durable Medical Equipment, Home Health Choice offered to:  Patient  DME Arranged:    DME Agency:     HH Arranged:    [place] Agency:     Status of Service:  In process, will continue to follow  If discussed at Long Length of Stay Meetings, dates discussed:    Additional Comments:  Marshell Garfinkel, RN 01/02/2016, 9:58 AM

## 2016-01-02 NOTE — Anesthesia Procedure Notes (Signed)
Spinal  Patient location during procedure: OR Start time: 01/02/2016 9:25 PM End time: 01/02/2016 9:42 PM Staffing Anesthesiologist: Martha Clan Performed: anesthesiologist  Preanesthetic Checklist Completed: patient identified, site marked, surgical consent, pre-op evaluation, timeout performed, IV checked, risks and benefits discussed and monitors and equipment checked Spinal Block Patient position: sitting Prep: ChloraPrep Patient monitoring: heart rate, continuous pulse ox and blood pressure Approach: midline Location: L3-4 Injection technique: single-shot Needle Needle type: [place] and Introducer  Needle gauge: 24 G Needle length: 9 cm Additional Notes Negative paresthesia. Negative blood return. Positive free-flowing CSF. Expiration date of kit checked and confirmed. Patient tolerated procedure well, without complications.

## 2016-01-02 NOTE — H&P (Addendum)
[place] @ [facility] Admission History and Physical Melissa [name], D.O.  ---------------------------------------------------------------------------------------------------------------------   PATIENT NAME: Melissa [name] MR#: [id number] DATE OF BIRTH: 11/05/1940 DATE OF ADMISSION: 01/01/2016 PRIMARY CARE PHYSICIAN: Melissa Hector, MD  REQUESTING/REFERRING PHYSICIAN: ED Dr. Alfred [name]  CHIEF COMPLAINT: Chief Complaint  Patient presents with  . Fall    HISTORY OF PRESENT ILLNESS: Melissa [name] is a 75 y.o. female with a known history of Hypertension, hyperlipidemia, CVA presents to the emergency department for evaluation of fall.  Patient was in a usual state of health until this afternoon when she reports a mechanical fall while walking down her driveway. She landed on her left hip reports inability to stand up secondary to pain on the left side. Her husband found her down on the ground after about 2-3 hours. She denies loss of consciousness no preceding symptoms such as dizziness, lightheadedness. Her only complaint at this time is left-sided hip pain.  Otherwise there has been no change in status. Patient has been taking medication as prescribed and there has been no recent change in medication or diet.  There has been no recent illness, travel or sick contacts.    Patient denies fevers/chills, weakness, dizziness, chest pain, shortness of breath, N/V/C/D, abdominal pain, dysuria/frequency, changes in mental status.    PAST MEDICAL HISTORY: Past Medical History:  Diagnosis Date  . Stroke ([place])   Hypertension, hyperlipidemia    PAST SURGICAL HISTORY: Past Surgical History:  Procedure Laterality Date  . BREAST BIOPSY Right    core- stereo- neg      SOCIAL HISTORY: Social History  Substance Use Topics  . Smoking status: Never Smoker  . Smokeless tobacco: Never Used  . Alcohol use No      FAMILY HISTORY: Family History  Problem Relation Age  of Onset  . Breast cancer Maternal Aunt      MEDICATIONS AT HOME: Prior to Admission medications   Medication Sig Start Date End Date Taking? Authorizing Provider  amLODipine (NORVASC) 10 MG tablet Take 10 mg by mouth daily.   Yes Historical Provider, MD  aspirin EC 325 MG tablet Take 325 mg by mouth daily.   Yes Historical Provider, MD  Coenzyme Q10 (COQ10) 100 MG CAPS Take 100 mg by mouth daily.   Yes Historical Provider, MD  hydrochlorothiazide (HYDRODIURIL) 25 MG tablet Take 25 mg by mouth daily.   Yes Historical Provider, MD  latanoprost (XALATAN) 0.005 % ophthalmic solution Place 1 drop into both eyes at bedtime.   Yes Historical Provider, MD  Multiple Vitamin (MULTIVITAMIN) tablet Take 1 tablet by mouth daily.   Yes Historical Provider, MD  pravastatin (PRAVACHOL) 20 MG tablet Take 20 mg by mouth daily.   Yes Historical Provider, MD  timolol (BETIMOL) 0.25 % ophthalmic solution Place 1 drop into both eyes daily.   Yes Historical Provider, MD  triamcinolone cream (KENALOG) 0.5 % Apply 1 application topically 2 (two) times daily.   Yes Historical Provider, MD  vitamin E 400 UNIT capsule Take 400 Units by mouth daily.   Yes Historical Provider, MD      DRUG ALLERGIES: No Known Allergies   REVIEW OF SYSTEMS: CONSTITUTIONAL: No fatigue, weakness, fever, chills, weight gain/loss, headache EYES: No blurry or double vision. ENT: No tinnitus, postnasal drip, redness or soreness of the oropharynx. RESPIRATORY: No dyspnea, cough, wheeze, hemoptysis. CARDIOVASCULAR: No chest pain, orthopnea, palpitations, syncope. GASTROINTESTINAL: No nausea, vomiting, constipation, diarrhea, abdominal pain. No hematemesis, melena or hematochezia. GENITOURINARY: No dysuria, frequency,  hematuria. ENDOCRINE: No polyuria or nocturia. No heat or cold intolerance. HEMATOLOGY: No anemia, bruising, bleeding. INTEGUMENTARY: No rashes, ulcers, lesions. MUSCULOSKELETAL: Positive left hip pain, negative  arthritis, swelling, gout. NEUROLOGIC: No numbness, tingling, weakness or ataxia. No seizure-type activity. PSYCHIATRIC: No anxiety, depression, insomnia.  PHYSICAL EXAMINATION: VITAL SIGNS: Blood pressure (!) 150/65, pulse 81, temperature 97.8 F (36.6 C), temperature source Oral, resp. rate 18, height 5\' 2"  (1.575 m), weight 70.8 kg (156 lb), SpO2 98 %.  GENERAL: 75 y.o.-year-old female patient, well-developed, well-nourished lying in the bed in no acute distress.  Pleasant and cooperative.   HEENT: Head atraumatic, normocephalic. Pupils equal, round, reactive to light and accommodation. No scleral icterus. Extraocular muscles intact. Oropharynx is clear. Mucus membranes moist. NECK: Supple, full range of motion. No JVD, no bruit heard. No cervical lymphadenopathy. CHEST: Normal breath sounds bilaterally. No wheezing, rales, rhonchi or crackles. No use of accessory muscles of respiration.  No reproducible chest wall tenderness.  CARDIOVASCULAR: S1, S2 normal. No murmurs, rubs, or gallops appreciated. Cap refill <2 seconds. ABDOMEN: Soft, nontender, nondistended. No rebound, guarding, rigidity. Normoactive bowel sounds present in all four quadrants. No organomegaly or mass. EXTREMITIES: Left leg is shortened and externally rotated with tenderness over the left hip. No pedal edema, cyanosis, or clubbing. NEUROLOGIC: Cranial nerves II through XII are grossly intact with no focal sensorimotor deficit. Muscle strength 5/5 in all extremities except left lower extremity which is limited secondary to pain.. Sensation intact. Gait not checked. PSYCHIATRIC: The patient is alert and oriented x 3. Normal affect, mood, thought content. SKIN: Warm, dry, and intact without obvious rash, lesion, or ulcer.  LABORATORY PANEL:  CBC  Recent Labs Lab 01/01/16 2323  WBC 16.7*  HGB 14.4  HCT 42.2  PLT 207    ----------------------------------------------------------------------------------------------------------------- Chemistries  Recent Labs Lab 01/01/16 1930  NA 138  K 3.0*  CL 100*  CO2 26  GLUCOSE 127*  BUN 16  CREATININE 0.67  CALCIUM 9.4   ------------------------------------------------------------------------------------------------------------------ Cardiac Enzymes No results for input(s): TROPONINI in the last 168 hours. ------------------------------------------------------------------------------------------------------------------  RADIOLOGY: Ct Head Wo Contrast  Result Date: 01/01/2016 CLINICAL DATA:  Status post fall. EXAM: CT HEAD WITHOUT CONTRAST CT CERVICAL SPINE WITHOUT CONTRAST TECHNIQUE: Multidetector CT imaging of the head and cervical spine was performed following the standard protocol without intravenous contrast. Multiplanar CT image reconstructions of the cervical spine were also generated. COMPARISON:  Brain MRI 08/06/2006 FINDINGS: CT HEAD FINDINGS Brain: No mass lesion, intraparenchymal hemorrhage or extra-axial collection. No evidence of acute cortical infarct. 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 Vascular: No hyperdense vessel or unexpected calcification. Skull: Normal visualized skull base, calvarium and extracranial soft tissues. Sinuses/Orbits: No sinus fluid levels or advanced mucosal thickening. No mastoid effusion. Normal orbits. CT CERVICAL SPINE FINDINGS Alignment: No static subluxation. Facets are aligned. Occipital condyles are normally positioned. Skull base and vertebrae: No acute fracture. Soft tissues and spinal canal: No prevertebral fluid or swelling. No visible canal hematoma. Disc levels: No advanced spinal canal stenosis. There is multilevel severe facet hypertrophy without obvious severe foraminal stenosis. Upper chest: No pneumothorax, pulmonary nodule or pleural effusion. Other: Normal visualized paraspinal cervical soft tissues.  IMPRESSION: 1. No acute intracranial abnormality. Old left thalamic lacunar infarct. 2. No acute fracture or static subluxation of the cervical spine. Electronically Signed   By: Ulyses Jarred M.D.   On: 01/01/2016 20:59   Ct Cervical Spine Wo Contrast  Result Date: 01/01/2016 CLINICAL DATA:  Status post fall. EXAM: CT HEAD WITHOUT CONTRAST  CT CERVICAL SPINE WITHOUT CONTRAST TECHNIQUE: Multidetector CT imaging of the head and cervical spine was performed following the standard protocol without intravenous contrast. Multiplanar CT image reconstructions of the cervical spine were also generated. COMPARISON:  Brain MRI 08/06/2006 FINDINGS: CT HEAD FINDINGS Brain: No mass lesion, intraparenchymal hemorrhage or extra-axial collection. No evidence of acute cortical infarct. 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 Vascular: No hyperdense vessel or unexpected calcification. Skull: Normal visualized skull base, calvarium and extracranial soft tissues. Sinuses/Orbits: No sinus fluid levels or advanced mucosal thickening. No mastoid effusion. Normal orbits. CT CERVICAL SPINE FINDINGS Alignment: No static subluxation. Facets are aligned. Occipital condyles are normally positioned. Skull base and vertebrae: No acute fracture. Soft tissues and spinal canal: No prevertebral fluid or swelling. No visible canal hematoma. Disc levels: No advanced spinal canal stenosis. There is multilevel severe facet hypertrophy without obvious severe foraminal stenosis. Upper chest: No pneumothorax, pulmonary nodule or pleural effusion. Other: Normal visualized paraspinal cervical soft tissues. IMPRESSION: 1. No acute intracranial abnormality. Old left thalamic lacunar infarct. 2. No acute fracture or static subluxation of the cervical spine. Electronically Signed   By: Ulyses Jarred M.D.   On: 01/01/2016 20:59   Dg Chest Portable 1 View  Result Date: 01/01/2016 CLINICAL DATA:  [place] with left proximal femur pain. Left hip fracture. EXAM:  PORTABLE CHEST 1 VIEW COMPARISON:  08/05/2006 FINDINGS: Both lungs are clear. Heart and mediastinum are within normal limits. Trachea is midline. Negative for a pneumothorax. No acute bone abnormality in the chest. IMPRESSION: No acute chest abnormality. Electronically Signed   By: Markus Daft M.D.   On: 01/01/2016 20:53   Dg Hip Unilat W Or Wo Pelvis 2-3 Views Left  Result Date: 01/01/2016 CLINICAL DATA:  Status post fall EXAM: DG HIP (WITH OR WITHOUT PELVIS) 2-3V LEFT COMPARISON:  None. FINDINGS: There is a fracture of the left femoral neck with superior displacement of the femur relative to the femoral head. The femoral head remains within the acetabular cup. The remainder of the visualized pelvis is unremarkable. IMPRESSION: Acute fracture of the left femoral neck with approximately 2 cm of overriding. Electronically Signed   By: Ulyses Jarred M.D.   On: 01/01/2016 21:05    EKG: Pending at time of admission   IMPRESSION AND PLAN:  This is a 75 y.o. female with a history of hypertension, hyperlipidemia, CVA now being admitted with: 1. Left-sided femoral neck fracture-admit to inpatient for pain control, orthopedics consultation, nothing by mouth, bedrest. Patient has not had cardiac workup will therefore require preoperative evaluation. I have requested a cardiology consultation, EKG and echocardiogram, lipid panel. 2. History of hypertension-continue Norvasc, hydrochlorothiazide. 3. History of hyperlipidemia-continue pravastatin.  4. History of CVA-hold aspirin for OR 5. Hypokalemia, mild-we'll replace by mouth Continue eyedrops   Diet/Nutrition: nothing by mouth Fluids: IV normal saline DVT Px: SCDs and early ambulation Code Status: Full  All the records are reviewed and case discussed with ED provider. Management plans discussed with the patient and/or family who express understanding and agree with plan of care.   TOTAL TIME TAKING CARE OF THIS PATIENT: 60 minutes.   Wasil Wolke D.O. on 01/02/2016 at 2:01 AM Between 7am to 6pm - Pager - [phone] After 6pm go to [email or web address] - [employer] [facility] Office [phone] CC: Primary care physician; Melissa Hector, MD     Note: This dictation was prepared with Dragon dictation along with smaller phrase technology. Any transcriptional errors that result from  this process are unintentional.

## 2016-01-02 NOTE — Progress Notes (Signed)
Nutrition Brief Note  Patient identified via Hip Fracture Protocol.  Wt Readings from Last 15 Encounters:  01/01/16 156 lb (70.8 kg)    Body mass index is 28.53 kg/m. Patient meets criteria for overweight based on current BMI.   Current diet order is NPO, patient is consuming approximately No meals at this time. Labs and medications reviewed.   Endorses good appetite PTA Denies weight loss Denies nausea/vomiting Denies chewing/swallowing/choking concerns Monitor PO following surgery.  No nutrition interventions warranted at this time. If nutrition issues arise, please consult RD.   Satira Anis. Tahirih Lair, MS, RD LDN Inpatient Clinical Dietitian Pager [phone]

## 2016-01-02 NOTE — Progress Notes (Signed)
[place] NAME: Melissa [name]    MR#:  [id number]  DATE OF BIRTH:  March 15, 1941  SUBJECTIVE:  Came in after she had mechanical fall at home while trying to bring her flower pots in the garage. Found to have right hip fracture  REVIEW OF SYSTEMS:   Review of Systems  Constitutional: Negative for chills, fever and weight loss.  HENT: Negative for ear discharge, ear pain and nosebleeds.   Eyes: Negative for blurred vision, pain and discharge.  Respiratory: Negative for sputum production, shortness of breath, wheezing and stridor.   Cardiovascular: Negative for chest pain, palpitations, orthopnea and PND.  Gastrointestinal: Negative for abdominal pain, diarrhea, nausea and vomiting.  Genitourinary: Negative for frequency and urgency.  Musculoskeletal: Positive for joint pain. Negative for back pain.  Neurological: Negative for sensory change, speech change, focal weakness and weakness.  Psychiatric/Behavioral: Negative for depression and hallucinations. The patient is not nervous/anxious.    Tolerating Diet:npo Tolerating PT: pending  DRUG ALLERGIES:  No Known Allergies  VITALS:  Blood pressure 137/62, pulse 82, temperature 98.6 F (37 C), temperature source Oral, resp. rate 18, height 5\' 2"  (1.575 m), weight 70.8 kg (156 lb), SpO2 95 %.  PHYSICAL EXAMINATION:   Physical Exam  GENERAL:  75 y.o.-year-old patient lying in the bed with no acute distress.  EYES: Pupils equal, round, reactive to light and accommodation. No scleral icterus. Extraocular muscles intact.  HEENT: Head atraumatic, normocephalic. Oropharynx and nasopharynx clear.  NECK:  Supple, no jugular venous distention. No thyroid enlargement, no tenderness.  LUNGS: Normal breath sounds bilaterally, no wheezing, rales, rhonchi. No use of accessory muscles of respiration.  CARDIOVASCULAR: S1, S2 normal. No murmurs, rubs, or gallops.  ABDOMEN: Soft,  nontender, nondistended. Bowel sounds present. No organomegaly or mass.  EXTREMITIES: No cyanosis, clubbing or edema b/l.   Right LE in buck's traction. Good pedal pulses NEUROLOGIC: Cranial nerves II through XII are intact. No focal Motor or sensory deficits b/l.   PSYCHIATRIC:  patient is alert and oriented x 3.  SKIN: No obvious rash, lesion, or ulcer.   LABORATORY PANEL:  CBC  Recent Labs Lab 01/02/16 0357  WBC 12.7*  HGB 14.4  HCT 41.7  PLT 204    Chemistries   Recent Labs Lab 01/02/16 0357  NA 140  K 3.6  CL 104  CO2 28  GLUCOSE 126*  BUN 14  CREATININE 0.53  CALCIUM 9.1   Cardiac Enzymes No results for input(s): TROPONINI in the last 168 hours. RADIOLOGY:  Ct Head Wo Contrast  Result Date: 01/01/2016 CLINICAL DATA:  Status post fall. EXAM: CT HEAD WITHOUT CONTRAST CT CERVICAL SPINE WITHOUT CONTRAST TECHNIQUE: Multidetector CT imaging of the head and cervical spine was performed following the standard protocol without intravenous contrast. Multiplanar CT image reconstructions of the cervical spine were also generated. COMPARISON:  Brain MRI 08/06/2006 FINDINGS: CT HEAD FINDINGS Brain: No mass lesion, intraparenchymal hemorrhage or extra-axial collection. No evidence of acute cortical infarct. 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 Vascular: No hyperdense vessel or unexpected calcification. Skull: Normal visualized skull base, calvarium and extracranial soft tissues. Sinuses/Orbits: No sinus fluid levels or advanced mucosal thickening. No mastoid effusion. Normal orbits. CT CERVICAL SPINE FINDINGS Alignment: No static subluxation. Facets are aligned. Occipital condyles are normally positioned. Skull base and vertebrae: No acute fracture. Soft tissues and spinal canal: No prevertebral fluid or swelling. No visible canal hematoma. Disc levels: No advanced spinal canal stenosis. There is  multilevel severe facet hypertrophy without obvious severe foraminal stenosis. Upper chest: No  pneumothorax, pulmonary nodule or pleural effusion. Other: Normal visualized paraspinal cervical soft tissues. IMPRESSION: 1. No acute intracranial abnormality. Old left thalamic lacunar infarct. 2. No acute fracture or static subluxation of the cervical spine. Electronically Signed   By: Ulyses Jarred M.D.   On: 01/01/2016 20:59   Ct Cervical Spine Wo Contrast  Result Date: 01/01/2016 CLINICAL DATA:  Status post fall. EXAM: CT HEAD WITHOUT CONTRAST CT CERVICAL SPINE WITHOUT CONTRAST TECHNIQUE: Multidetector CT imaging of the head and cervical spine was performed following the standard protocol without intravenous contrast. Multiplanar CT image reconstructions of the cervical spine were also generated. COMPARISON:  Brain MRI 08/06/2006 FINDINGS: CT HEAD FINDINGS Brain: No mass lesion, intraparenchymal hemorrhage or extra-axial collection. No evidence of acute cortical infarct. 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 Vascular: No hyperdense vessel or unexpected calcification. Skull: Normal visualized skull base, calvarium and extracranial soft tissues. Sinuses/Orbits: No sinus fluid levels or advanced mucosal thickening. No mastoid effusion. Normal orbits. CT CERVICAL SPINE FINDINGS Alignment: No static subluxation. Facets are aligned. Occipital condyles are normally positioned. Skull base and vertebrae: No acute fracture. Soft tissues and spinal canal: No prevertebral fluid or swelling. No visible canal hematoma. Disc levels: No advanced spinal canal stenosis. There is multilevel severe facet hypertrophy without obvious severe foraminal stenosis. Upper chest: No pneumothorax, pulmonary nodule or pleural effusion. Other: Normal visualized paraspinal cervical soft tissues. IMPRESSION: 1. No acute intracranial abnormality. Old left thalamic lacunar infarct. 2. No acute fracture or static subluxation of the cervical spine. Electronically Signed   By: Ulyses Jarred M.D.   On: 01/01/2016 20:59   Dg Chest Portable 1  View  Result Date: 01/01/2016 CLINICAL DATA:  [place] with left proximal femur pain. Left hip fracture. EXAM: PORTABLE CHEST 1 VIEW COMPARISON:  08/05/2006 FINDINGS: Both lungs are clear. Heart and mediastinum are within normal limits. Trachea is midline. Negative for a pneumothorax. No acute bone abnormality in the chest. IMPRESSION: No acute chest abnormality. Electronically Signed   By: Markus Daft M.D.   On: 01/01/2016 20:53   Dg Hip Unilat W Or Wo Pelvis 2-3 Views Left  Result Date: 01/01/2016 CLINICAL DATA:  Status post fall EXAM: DG HIP (WITH OR WITHOUT PELVIS) 2-3V LEFT COMPARISON:  None. FINDINGS: There is a fracture of the left femoral neck with superior displacement of the femur relative to the femoral head. The femoral head remains within the acetabular cup. The remainder of the visualized pelvis is unremarkable. IMPRESSION: Acute fracture of the left femoral neck with approximately 2 cm of overriding. Electronically Signed   By: Ulyses Jarred M.D.   On: 01/01/2016 21:05   ASSESSMENT AND PLAN:  75 y.o. female with a history of hypertension, hyperlipidemia, CVA now being admitted with:  1. Left-sided femoral neck fracture -s/p mechanical fall at home -pt has no cardiac history. No cp or sob. Very active and functional at home -EKG NSR -pt is at a low risk for surgery. Dr Marry Guan informed to proceed for surgery. -pt voiced understanding of risks and benefits for surgery  2. History of hypertension-continue Norvasc, hydrochlorothiazide.  3. History of hyperlipidemia-continue pravastatin.   4. History of CVA-hold aspirin for OR  5. Hypokalemia, mild -repleted  Case discussed with Care Management/Social Worker. Management plans discussed with the patient, family and they are in agreement.  CODE STATUS: full  DVT Prophylaxis: SCD/TEDs  TOTAL TIME TAKING CARE OF THIS PATIENT: 30 minutes.  >50%  time spent on counselling and coordination of care pt and Dr Marry Guan  POSSIBLE D/C IN  2-3 DAYS, DEPENDING ON CLINICAL CONDITION.  Note: This dictation was prepared with Dragon dictation along with smaller phrase technology. Any transcriptional errors that result from this process are unintentional.  Tayvia Faughnan M.D on 01/02/2016 at 8:17 AM  Between 7am to 6pm - Pager - [phone]  After 6pm go to [email or web address] - password [email or web address] [facility]  [place] [facility]  Office  [phone]  CC: Primary care physician; Adin Hector, MD

## 2016-01-03 ENCOUNTER — Inpatient Hospital Stay: Payer: Medicare Other

## 2016-01-03 LAB — CBC
HEMATOCRIT: 36.8 % (ref 35.0–47.0)
HEMOGLOBIN: 12.9 g/dL (ref 12.0–16.0)
MCH: 33.2 pg (ref 26.0–34.0)
MCHC: 35 g/dL (ref 32.0–36.0)
MCV: 94.9 fL (ref 80.0–100.0)
Platelets: 168 10*3/uL (ref 150–440)
RBC: 3.88 MIL/uL (ref 3.80–5.20)
RDW: 12.7 % (ref 11.5–14.5)
WBC: 14.7 10*3/uL — AB (ref 3.6–11.0)

## 2016-01-03 LAB — BASIC METABOLIC PANEL
ANION GAP: 7 (ref 5–15)
BUN: 12 mg/dL (ref 6–20)
CALCIUM: 8.4 mg/dL — AB (ref 8.9–10.3)
CHLORIDE: 107 mmol/L (ref 101–111)
CO2: 24 mmol/L (ref 22–32)
Creatinine, Ser: 0.6 mg/dL (ref 0.44–1.00)
GFR calc non Af Amer: >60 mL/min (ref >60)
Glucose, Bld: 136 mg/dL — ABNORMAL HIGH (ref 65–99)
POTASSIUM: 3.5 mmol/L (ref 3.5–5.1)
Sodium: 138 mmol/L (ref 135–145)

## 2016-01-03 MED ORDER — ONDANSETRON HCL 4 MG/2ML IJ SOLN: 4.0000 mg | Freq: Four times a day (QID) | INTRAMUSCULAR | Status: DC | PRN

## 2016-01-03 MED ORDER — SODIUM CHLORIDE 0.9 % IV SOLN
INTRAVENOUS | Status: DC
  Administered 2016-01-03: 03:00:00 via INTRAVENOUS

## 2016-01-03 MED ORDER — MENTHOL 3 MG MT LOZG
1.0000 | LOZENGE | OROMUCOSAL | Status: DC | PRN
Start: 2016-01-03 — End: 2016-01-05
  Filled 2016-01-03: qty 9

## 2016-01-03 MED ORDER — METOCLOPRAMIDE HCL 5 MG/ML IJ SOLN: 5.0000 mg | Freq: Three times a day (TID) | INTRAMUSCULAR | Status: DC | PRN

## 2016-01-03 MED ORDER — PANTOPRAZOLE SODIUM 40 MG PO TBEC
40.0000 mg | DELAYED_RELEASE_TABLET | Freq: Two times a day (BID) | ORAL | Status: DC
  Administered 2016-01-03 – 2016-01-05 (×5): 40 mg via ORAL
  Filled 2016-01-03 (×5): qty 1

## 2016-01-03 MED ORDER — ONDANSETRON HCL 4 MG PO TABS: 4.0000 mg | ORAL_TABLET | Freq: Four times a day (QID) | ORAL | Status: DC | PRN

## 2016-01-03 MED ORDER — ACETAMINOPHEN 10 MG/ML IV SOLN
INTRAVENOUS | Status: DC | PRN
  Administered 2016-01-03: 1000 mg via INTRAVENOUS

## 2016-01-03 MED ORDER — ENOXAPARIN SODIUM 40 MG/0.4ML ~~LOC~~ SOLN
40.0000 mg | SUBCUTANEOUS | Status: DC
  Administered 2016-01-04 – 2016-01-05 (×2): 40 mg via SUBCUTANEOUS
  Filled 2016-01-03 (×2): qty 0.4

## 2016-01-03 MED ORDER — SENNOSIDES-DOCUSATE SODIUM 8.6-50 MG PO TABS
1.0000 | ORAL_TABLET | Freq: Two times a day (BID) | ORAL | Status: DC
  Administered 2016-01-03 – 2016-01-05 (×5): 1 via ORAL
  Filled 2016-01-03 (×5): qty 1

## 2016-01-03 MED ORDER — ONDANSETRON HCL 4 MG/2ML IJ SOLN: 4.0000 mg | Freq: Once | INTRAMUSCULAR | Status: DC | PRN

## 2016-01-03 MED ORDER — BISACODYL 10 MG RE SUPP: 10.0000 mg | Freq: Every day | RECTAL | Status: DC | PRN

## 2016-01-03 MED ORDER — FLEET ENEMA 7-19 GM/118ML RE ENEM: 1.0000 | ENEMA | Freq: Once | RECTAL | Status: DC | PRN

## 2016-01-03 MED ORDER — PHENOL 1.4 % MT LIQD
1.0000 | OROMUCOSAL | Status: DC | PRN
Start: 2016-01-03 — End: 2016-01-05
  Filled 2016-01-03: qty 177

## 2016-01-03 MED ORDER — FENTANYL CITRATE (PF) 100 MCG/2ML IJ SOLN: 25.0000 ug | INTRAMUSCULAR | Status: DC | PRN

## 2016-01-03 MED ORDER — MAGNESIUM HYDROXIDE 400 MG/5ML PO SUSP: 30.0000 mL | Freq: Every day | ORAL | Status: DC | PRN

## 2016-01-03 MED ORDER — OXYCODONE HCL 5 MG PO TABS
5.0000 mg | ORAL_TABLET | ORAL | Status: DC | PRN
  Administered 2016-01-03 (×2): 5 mg via ORAL
  Administered 2016-01-03: 10 mg via ORAL
  Administered 2016-01-04 (×2): 5 mg via ORAL
  Filled 2016-01-03 (×4): qty 1
  Filled 2016-01-03: qty 2

## 2016-01-03 MED ORDER — MORPHINE SULFATE (PF) 2 MG/ML IV SOLN: 2.0000 mg | INTRAVENOUS | Status: DC | PRN

## 2016-01-03 MED ORDER — FERROUS SULFATE 325 (65 FE) MG PO TABS
325.0000 mg | ORAL_TABLET | Freq: Two times a day (BID) | ORAL | Status: DC
  Administered 2016-01-03 – 2016-01-05 (×5): 325 mg via ORAL
  Filled 2016-01-03 (×5): qty 1

## 2016-01-03 MED ORDER — METOCLOPRAMIDE HCL 10 MG PO TABS
10.0000 mg | ORAL_TABLET | Freq: Three times a day (TID) | ORAL | Status: AC
  Administered 2016-01-03 – 2016-01-04 (×8): 10 mg via ORAL
  Filled 2016-01-03 (×7): qty 1

## 2016-01-03 MED ORDER — ACETAMINOPHEN 10 MG/ML IV SOLN
1000.0000 mg | Freq: Four times a day (QID) | INTRAVENOUS | Status: AC
  Administered 2016-01-03 (×4): 1000 mg via INTRAVENOUS
  Filled 2016-01-03 (×4): qty 100

## 2016-01-03 MED ORDER — METOCLOPRAMIDE HCL 10 MG PO TABS
5.0000 mg | ORAL_TABLET | Freq: Three times a day (TID) | ORAL | Status: DC | PRN
  Filled 2016-01-03: qty 1

## 2016-01-03 MED ORDER — TRAMADOL HCL 50 MG PO TABS
50.0000 mg | ORAL_TABLET | ORAL | Status: DC | PRN
  Administered 2016-01-04: 50 mg via ORAL
  Administered 2016-01-04: 100 mg via ORAL
  Administered 2016-01-05: 50 mg via ORAL
  Filled 2016-01-03 (×2): qty 1
  Filled 2016-01-03: qty 2

## 2016-01-03 MED ORDER — CEFAZOLIN SODIUM-DEXTROSE 2-4 GM/100ML-% IV SOLN
2.0000 g | Freq: Four times a day (QID) | INTRAVENOUS | Status: AC
  Administered 2016-01-03 (×4): 2 g via INTRAVENOUS
  Filled 2016-01-03 (×4): qty 100

## 2016-01-03 NOTE — Evaluation (Addendum)
Physical Therapy Evaluation Patient Details Name: Melissa [name] MRN: [id number] DOB: [birth year] Today's Date: 01/03/2016   History of Present Illness  presented to ER status post fall in home environment (down for 2-3 hours) with L hip fracture; admitted status post L hip hemiarthroplasty, 01/02/16, WBAT.  Clinical Impression  Upon evaluation, patient alert and oriented; follows all commands and demonstrates good effort with all activities.  L LE ROM grossly WFL, strength at least 3-/5 (limited by pain); full sensation returned/intact.  Able to complete bed mobility with min/mod assist; sit/stand, basic transfers and gait (4') with RW, cga/min assist.  Slow and guarded, but no overt buckling or LOB.  Mild nausea reported once positioned in chair; BP 132/52, HR 75.  Resolved with accommodation to position. Would benefit from skilled PT to address above deficits and promote optimal return to PLOF; Recommend transition to [place] upon discharge from acute hospitalization.  Per patient, husband and daughter available to assist as needed.     Follow Up Recommendations Home health PT    Equipment Recommendations  Rolling walker with 5" wheels    Recommendations for Other Services       Precautions / Restrictions Precautions Precautions: Fall;Posterior Hip Restrictions Weight Bearing Restrictions: Yes LLE Weight Bearing: Weight bearing as tolerated      Mobility  Bed Mobility Overal bed mobility: Needs Assistance Bed Mobility: Supine to Sit     Supine to sit: Min assist;Mod assist     General bed mobility comments: assist for LE management and truncal elevation  Transfers Overall transfer level: Needs assistance Equipment used: Rolling walker (2 wheeled) Transfers: Sit to/from Stand Sit to Stand: Min guard;Min assist            Ambulation/Gait Ambulation/Gait assistance: Min guard;Min assist Ambulation Distance (Feet): 5 Feet Assistive device: Rolling walker (2  wheeled)       General Gait Details: step to gait pattern, slow and guarded; fair weight acceptance and stance time L LE without buckling or LOB  Stairs            Wheelchair Mobility    Modified Rankin (Stroke Patients Only)       Balance Overall balance assessment: Needs assistance Sitting-balance support: No upper extremity supported;Feet supported Sitting balance-Leahy Scale: Good     Standing balance support: Bilateral upper extremity supported Standing balance-Leahy Scale: Fair                               Pertinent Vitals/Pain Pain Assessment: 0-10 Pain Score: 4  Pain Location: L hip Pain Descriptors / Indicators: Aching Pain Intervention(s): Limited activity within patient's tolerance;Monitored during session;Repositioned    Home Living Family/patient expects to be discharged to:: Private residence Living Arrangements: Spouse/significant other Available Help at Discharge: Family;Available 24 hours/day Type of Home: House Home Access: Ramped entrance     Home Layout: Two level;Able to live on main level with bedroom/bathroom        Prior Function Level of Independence: Independent         Comments: Indep with ADLs, household and community mobility; denies additional recent fall history.  Caregiver for 53 year old mother and 37 year old husband (runs errands, etc; no physical assist required)     Hand Dominance        Extremity/Trunk Assessment   Upper Extremity Assessment: Overall WFL for tasks assessed           Lower Extremity Assessment: Generalized  weakness (L LE grossly 3-/5, slow and guarded due to pain; sensation fully returned/intact)         Communication   Communication: No difficulties  Cognition Arousal/Alertness: Awake/alert Behavior During Therapy: WFL for tasks assessed/performed Overall Cognitive Status: Within Functional Limits for tasks assessed                      General Comments       Exercises Other Exercises Other Exercises: Supine LE therex, 1x10, AROM for muscular strength/endurance with functional activities: ankle pumps, quad sets, SAQs, heel slides, hip abduct/adduct.   Assessment/Plan    PT Assessment Patient needs continued PT services  PT Problem List Decreased strength;Decreased range of motion;Decreased activity tolerance;Decreased balance;Decreased mobility;Decreased knowledge of use of DME;Decreased safety awareness;Decreased knowledge of precautions          PT Treatment Interventions DME instruction;Gait training;Stair training;Functional mobility training;Therapeutic activities;Therapeutic exercise;Patient/family education;Balance training    PT Goals (Current goals can be found in the Care Plan section)  Acute Rehab PT Goals Patient Stated Goal: to return home PT Goal Formulation: With patient Time For Goal Achievement: 01/17/16 Potential to Achieve Goals: Good    Frequency BID   Barriers to discharge        Co-evaluation               End of Session Equipment Utilized During Treatment: Gait belt Activity Tolerance: Patient tolerated treatment well Patient left: in chair;with call bell/phone within reach;with chair alarm set           Time: 0951-1020 PT Time Calculation (min) (ACUTE ONLY): 29 min   Charges:   PT Evaluation $PT Eval Low Complexity: 1 Procedure PT Treatments $Therapeutic Exercise: 8-22 mins   PT G Codes:        Rhett Mutschler H. Owens Shark, PT, DPT, NCS 01/03/16, 11:09 AM [phone]  Addendum: discharge recs initially entered (STR) in error; updated to reflect current recommendations.  Ludwin Flahive H. Owens Shark, PT, DPT, NCS 01/03/16, 1:37 PM [phone]

## 2016-01-03 NOTE — Care Management (Addendum)
Met again with patient and she plans to go home. Lovenox 44m #14 per Dr. HMarry Guancalled in to  [facility] [phone] She has picked [facility] for home health services. I have notified [facility]. Rolling walker requested from [facility]. RNCM will continue to follow.

## 2016-01-03 NOTE — Transfer of Care (Signed)
Immediate Anesthesia Transfer of Care Note  Patient: Melissa [name]  Procedure(s) Performed: Procedure(s): ARTHROPLASTY BIPOLAR HIP (HEMIARTHROPLASTY) (Left)  Patient Location: PACU  Anesthesia Type:Spinal  Level of Consciousness: patient cooperative and lethargic  Airway & Oxygen Therapy: Patient Spontanous Breathing and Patient connected to nasal cannula oxygen  Post-op Assessment: Report given to RN and Post -op Vital signs reviewed and stable  Post vital signs: Reviewed and stable  Last Vitals:  Vitals:   01/02/16 0818 01/03/16 0059  BP: (!) 141/57 108/60  Pulse: 81 67  Resp: 16 15  Temp: 36.8 C     Last Pain:  Vitals:   01/02/16 1800  TempSrc:   PainSc: 0-No pain      Patients Stated Pain Goal: 2 ([id number])  Complications: No apparent anesthesia complications

## 2016-01-03 NOTE — Progress Notes (Signed)
Melissa [name] at [place] NAME: Melissa [name]    MR#:  [id number]  DATE OF BIRTH:  1940/12/16  SUBJECTIVE:s/p repair of left hip fracture  Yesterday.overall doing ok.  Came in after she had mechanical fall at home while trying to bring her flower pots in the garage. Found to have left  hip fracture  REVIEW OF SYSTEMS:   Review of Systems  Constitutional: Negative for chills, fever and weight loss.  HENT: Negative for ear discharge, ear pain and nosebleeds.   Eyes: Negative for blurred vision, pain and discharge.  Respiratory: Negative for sputum production, shortness of breath, wheezing and stridor.   Cardiovascular: Negative for chest pain, palpitations, orthopnea and PND.  Gastrointestinal: Negative for abdominal pain, diarrhea, nausea and vomiting.  Genitourinary: Negative for frequency and urgency.  Musculoskeletal: Positive for joint pain. Negative for back pain.  Neurological: Negative for sensory change, speech change, focal weakness and weakness.  Psychiatric/Behavioral: Negative for depression and hallucinations. The patient is not nervous/anxious.    Tolerating Diet:npo Tolerating PT: pending  DRUG ALLERGIES:  No Known Allergies  VITALS:  Blood pressure (!) 119/55, pulse 76, temperature 97.7 F (36.5 C), temperature source Oral, resp. rate 16, height 5\' 2"  (1.575 m), weight 70.8 kg (156 lb), SpO2 93 %.  PHYSICAL EXAMINATION:   Physical Exam  GENERAL:  75 y.o.-year-old patient lying in the bed with no acute distress.  EYES: Pupils equal, round, reactive to light and accommodation. No scleral icterus. Extraocular muscles intact.  HEENT: Head atraumatic, normocephalic. Oropharynx and nasopharynx clear.  NECK:  Supple, no jugular venous distention. No thyroid enlargement, no tenderness.  LUNGS: Normal breath sounds bilaterally, no wheezing, rales, rhonchi. No use of accessory muscles of respiration.  CARDIOVASCULAR: S1,  S2 normal. No murmurs, rubs, or gallops.  ABDOMEN: Soft, nontender, nondistended. Bowel sounds present. No organomegaly or mass.  EXTREMITIES: No cyanosis, clubbing or edema b/l.   Left hip JP drain present  NEUROLOGIC: Cranial nerves II through XII are intact. No focal Motor or sensory deficits b/l.   PSYCHIATRIC:  patient is alert and oriented x 3.  SKIN: No obvious rash, lesion, or ulcer.   LABORATORY PANEL:  CBC  Recent Labs Lab 01/03/16 0442  WBC 14.7*  HGB 12.9  HCT 36.8  PLT 168    Chemistries   Recent Labs Lab 01/03/16 0442  NA 138  K 3.5  CL 107  CO2 24  GLUCOSE 136*  BUN 12  CREATININE 0.60  CALCIUM 8.4*   Cardiac Enzymes No results for input(s): TROPONINI in the last 168 hours. RADIOLOGY:  Ct Head Wo Contrast  Result Date: 01/01/2016 CLINICAL DATA:  Status post fall. EXAM: CT HEAD WITHOUT CONTRAST CT CERVICAL SPINE WITHOUT CONTRAST TECHNIQUE: Multidetector CT imaging of the head and cervical spine was performed following the standard protocol without intravenous contrast. Multiplanar CT image reconstructions of the cervical spine were also generated. COMPARISON:  Brain MRI 08/06/2006 FINDINGS: CT HEAD FINDINGS Brain: No mass lesion, intraparenchymal hemorrhage or extra-axial collection. No evidence of acute cortical infarct. 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 Vascular: No hyperdense vessel or unexpected calcification. Skull: Normal visualized skull base, calvarium and extracranial soft tissues. Sinuses/Orbits: No sinus fluid levels or advanced mucosal thickening. No mastoid effusion. Normal orbits. CT CERVICAL SPINE FINDINGS Alignment: No static subluxation. Facets are aligned. Occipital condyles are normally positioned. Skull base and vertebrae: No acute fracture. Soft tissues and spinal canal: No prevertebral fluid or swelling. No visible canal hematoma.  Disc levels: No advanced spinal canal stenosis. There is multilevel severe facet hypertrophy without obvious  severe foraminal stenosis. Upper chest: No pneumothorax, pulmonary nodule or pleural effusion. Other: Normal visualized paraspinal cervical soft tissues. IMPRESSION: 1. No acute intracranial abnormality. Old left thalamic lacunar infarct. 2. No acute fracture or static subluxation of the cervical spine. Electronically Signed   By: Ulyses Jarred M.D.   On: 01/01/2016 20:59   Ct Cervical Spine Wo Contrast  Result Date: 01/01/2016 CLINICAL DATA:  Status post fall. EXAM: CT HEAD WITHOUT CONTRAST CT CERVICAL SPINE WITHOUT CONTRAST TECHNIQUE: Multidetector CT imaging of the head and cervical spine was performed following the standard protocol without intravenous contrast. Multiplanar CT image reconstructions of the cervical spine were also generated. COMPARISON:  Brain MRI 08/06/2006 FINDINGS: CT HEAD FINDINGS Brain: No mass lesion, intraparenchymal hemorrhage or extra-axial collection. No evidence of acute cortical infarct. 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 Vascular: No hyperdense vessel or unexpected calcification. Skull: Normal visualized skull base, calvarium and extracranial soft tissues. Sinuses/Orbits: No sinus fluid levels or advanced mucosal thickening. No mastoid effusion. Normal orbits. CT CERVICAL SPINE FINDINGS Alignment: No static subluxation. Facets are aligned. Occipital condyles are normally positioned. Skull base and vertebrae: No acute fracture. Soft tissues and spinal canal: No prevertebral fluid or swelling. No visible canal hematoma. Disc levels: No advanced spinal canal stenosis. There is multilevel severe facet hypertrophy without obvious severe foraminal stenosis. Upper chest: No pneumothorax, pulmonary nodule or pleural effusion. Other: Normal visualized paraspinal cervical soft tissues. IMPRESSION: 1. No acute intracranial abnormality. Old left thalamic lacunar infarct. 2. No acute fracture or static subluxation of the cervical spine. Electronically Signed   By: Ulyses Jarred M.D.   On:  01/01/2016 20:59   Dg Chest Portable 1 View  Result Date: 01/01/2016 CLINICAL DATA:  [place] with left proximal femur pain. Left hip fracture. EXAM: PORTABLE CHEST 1 VIEW COMPARISON:  08/05/2006 FINDINGS: Both lungs are clear. Heart and mediastinum are within normal limits. Trachea is midline. Negative for a pneumothorax. No acute bone abnormality in the chest. IMPRESSION: No acute chest abnormality. Electronically Signed   By: Markus Daft M.D.   On: 01/01/2016 20:53   Dg Hip Port Unilat With Pelvis 1v Left  Result Date: 01/03/2016 CLINICAL DATA:  75 year old female with left hip hemiarthroplasty. EXAM: DG HIP (WITH OR WITHOUT PELVIS) 1V PORT LEFT COMPARISON:  Radiograph dated 01/01/2016 FINDINGS: There has been interval placement of a left femoral prostheses. The orthopedic hardware appears intact. There is no evidence of loosening. No acute fracture or dislocation identified. The head of the orthopedic hardware is in anatomic alignment with the acetabulum. The bones are osteopenic. The soft tissues appear unremarkable. A drainage catheter noted over the left hip. Cutaneous surgical clips noted. IMPRESSION: Postsurgical changes of left hip hemiarthroplasty. No acute fracture or dislocation. Electronically Signed   By: Anner Crete M.D.   On: 01/03/2016 02:18   Dg Hip Unilat W Or Wo Pelvis 2-3 Views Left  Result Date: 01/01/2016 CLINICAL DATA:  Status post fall EXAM: DG HIP (WITH OR WITHOUT PELVIS) 2-3V LEFT COMPARISON:  None. FINDINGS: There is a fracture of the left femoral neck with superior displacement of the femur relative to the femoral head. The femoral head remains within the acetabular cup. The remainder of the visualized pelvis is unremarkable. IMPRESSION: Acute fracture of the left femoral neck with approximately 2 cm of overriding. Electronically Signed   By: Ulyses Jarred M.D.   On: 01/01/2016 21:05   ASSESSMENT  AND PLAN:  75 y.o. female with a history of hypertension, hyperlipidemia,  CVA now being admitted with:  1.Left Femoral neck fracture status post repair with hemiarthroplasty last night.-Postop day 1. Continue physical therapy, DVT prophylaxis, possible rehabilitation placement-\ 2. History of hypertension bp soft today. Can hold the Norvasc, HCTZ. Discussed with the nurse. 3. History of hyperlipidemia-continue pravastatin.   4. History of CVA-restart aspirin If ok with ortho 5. Hypokalemia, mild -repleted  Case discussed with Care Management/Social Worker. Management plans discussed with the patient, family and they are in agreement.  CODE STATUS: full  DVT Prophylaxis: lovenox  TOTAL TIME TAKING CARE OF THIS PATIENT: 30 minutes.  >50% time spent on counselling and coordination of care pt and Dr Marry Guan  POSSIBLE D/C IN 2-3 DAYS, DEPENDING ON CLINICAL CONDITION.  Note: This dictation was prepared with Dragon dictation along with smaller phrase technology. Any transcriptional errors that result from this process are unintentional.  Dameshia Seybold M.D on 01/03/2016 at 9:43 AM  Between 7am to 6pm - Pager - [phone]  After 6pm go to [email or web address] - password [email or web address] [facility]  [facility]  Office  [phone]  CC: Primary care physician; Adin Hector, MD

## 2016-01-03 NOTE — Progress Notes (Signed)
Patient BP 119/55. MD notified. Ordered to hold Amlodipine.   Deri Fuelling, RN

## 2016-01-03 NOTE — Progress Notes (Signed)
LCSW met with patient at the bedside along with a friend. Permission given to speak in front of friend.  Discussed disposition with patient and recommendations from treatment team and SNF rehab.  Discussed in detail about SNF and benefit and that insurance would be covered.  Patient very polite and reports she wants to go home. She reports her husband is at home and can help her as needed. Reports she lives on the first floor of home.  Has transport if needed and agreeable to [facility] or outpatient physical therapy.  CM was made aware of patient's needs and wishes.    Will sign off for now. If needs arise, please re-consult.  Lane Hacker, MSW Clinical Social Work: [employer]

## 2016-01-03 NOTE — Op Note (Signed)
OPERATIVE NOTE  DATE OF SURGERY:  01/02/2016  PATIENT NAME:  Melissa [name]   DOB: 1940-12-17  MRN: [id number]  PRE-OPERATIVE DIAGNOSIS: Left femoral neck fracture  POST-OPERATIVE DIAGNOSIS:  Same  PROCEDURE:  Left hip hemiarthroplasty  SURGEON:  Marciano Sequin. M.D.  ANESTHESIA: spinal  ESTIMATED BLOOD LOSS: 500 mL  FLUIDS REPLACED: 1000 mL of crystalloid  DRAINS: 2 medium drains to a Hemovac reservoir  IMPLANTS UTILIZED: DePuy size 2 Summit femoral stem (cemented), 11 mm Cementralizer, 43 mm OD Cathcart hip ball, -3 mm tapered spacer, and a size 2 femoral cement restrictor  INDICATIONS FOR SURGERY: Melissa [name] is a 75 y.o. year old female who fell and sustained a displaced left femoral neck fracture on 01/01/2016. After discussion of the risks and benefits of surgical intervention, the patient expressed understanding of the risks benefits and agree with plans for hip hemiarthroplasty.   The risks, benefits, and alternatives were discussed at length including but not limited to the risks of infection, bleeding, nerve injury, stiffness, blood clots, the need for revision surgery, limb length inequality, dislocation, cardiopulmonary complications, among others, and they were willing to proceed.  PROCEDURE IN DETAIL: The patient was brought into the operating room and, after adequate spinal anesthesia was achieved, patient was placed in a right lateral decubitus position. Axillary roll was placed and all bony prominences were well-padded. The patient's left hip was cleaned and prepped with alcohol and DuraPrep and draped in the usual sterile fashion. A "timeout" was performed as per usual protocol. A lateral curvilinear incision was made gently curving towards the posterior superior iliac spine. The IT band was incised in line with the skin incision and the fibers of the gluteus maximus were split in line. The piriformis tendon was identified, skeletonized, and incised at its  insertion to the proximal femur and reflected posteriorly. A T type posterior capsulotomy was performed. The femoral head was then removed using a corkscrew device. The femoral head was measured using calipers and ring gauges and determined to be 43 mm in diameter.The femoral neck cut was performed using an oscillating saw. The acetabulum was inspected for any bony fragments. The articular surface was in good condition.  Attention was then directed to the proximal femur. A pilot hole for preparation of the proximal femoral canal was created using a high-speed bur. The femoral canal finder was inserted followed by insertion of the conical reamer. Serial broaches were inserted up to a size 2 broach. Calcar region was planed and a trial reduction was performed using a 43 mm OD Cathcart ball with a -3 mm neck length. Good equalization of limb lengths was appreciated and excellent stability was noted both anteriorly and posteriorly. Trial components were removed. The femoral canal was sized and was felt that a size 2 cement restrictor was appropriate. The cement restrictor was inserted to the appropriate depth in the femoral canal was irrigated with copious amounts of fluid using the pulse lavage and suctioned dry. The femoral canal was then packed with vaginal packing soaked in dilute Neo-Synephrine. Polymethylmethacrylate cement was prepared in the usual fashion using a vacuum mixer. Vaginal packing was removed and the canal again irrigated and suctioned dry. The polymethylmethacrylate cement was inserted in retrograde fashion and pressurized. The size 2 Summit femoral component with an 11 mm Cementralizer was positioned and impacted into place. Excess cement was removed using [employer] fast streamer. After adequate curing of the cement, the Morse taper was cleaned and dried. A 43 mm outer  diameter Cathcart hip ball with a -3 mm tapered spacer was placed on the trunnion and impacted into place. The acetabulum was again  irrigated and suctioned dry, making sure to inspect for any residal bony debris. The femoral head was then reduced and placed through a range of motion. Excellent stability was noted both anteriorly and posteriorly. Good equalization of limb lengths was appreciated.   The wound was irrigated with copious amounts of normal saline with antibiotic solution and suctioned dry. Good hemostasis was appreciated. The posterior capsulotomy was repaired using #5 Ethibond. Piriformis tendon was reapproximated to the undersurface of the gluteus medius tendon using #5 Ethibond. Two medium drains were placed in the wound bed and brought out through separate stab incisions to be attached to a Hemovac reservoir. The IT band was reapproximated using interrupted sutures of #1 Vicryl. Subcutaneous tissue was proximal phalanx using first #0 Vicryl followed by #2-0 Vicryl. The skin was closed with skin staples.  The patient tolerated the procedure well and was transported to the recovery room in stable condition.   Marciano Sequin., M.D.

## 2016-01-03 NOTE — Evaluation (Signed)
Occupational Therapy Evaluation Patient Details Name: Melissa [name] MRN: [id number] DOB: [birth year] Today's Date: 01/03/2016    History of Present Illness Pt. was admitted for a Left Hip Hemiarthroplasty S/P Left Hip Fracture.   Clinical Impression   Pt. Was admitted to [facility] for a Left Hip Hemiarthroplasty s/p Left Hip Fracture. Pt. Presents with pain, weakness, posterior hip precautions, and decreased functional mobility which hinder her ability to complete ADL and IADLs. Pt. Could benefit from skilled OT services for ADL training, A/E training, UE there. Ex, and pt. Education in home modification, and DME. Follow up OT services are warranted.    Follow Up Recommendations  Home health OT    Equipment Recommendations       Recommendations for Other Services       Precautions / Restrictions Precautions Precautions: Fall;Posterior Hip Restrictions Weight Bearing Restrictions: Yes LLE Weight Bearing: Weight bearing as tolerated              ADL Overall ADL's : Needs assistance/impaired Eating/Feeding: Set up   Grooming: Set up               Lower Body Dressing: Maximal assistance                 General ADL Comments: Pt. education was provided about Posterior Hip precautions, and A/E use for LE ADLs.     Vision     Perception     Praxis      Pertinent Vitals/Pain Pain Assessment: 0-10 Pain Score: 4  Pain Location: Left Hip Pain Descriptors / Indicators: Aching Pain Intervention(s): Limited activity within patient's tolerance;Monitored during session;Repositioned     Hand Dominance Right   Extremity/Trunk Assessment Upper Extremity Assessment Upper Extremity Assessment: Overall WFL for tasks assessed   Lower Extremity Assessment Lower Extremity Assessment: Generalized weakness (L LE grossly 3-/5, slow and guarded due to pain; sensation fully returned/intact)       Communication Communication Communication: No difficulties    Cognition Arousal/Alertness: Awake/alert Behavior During Therapy: WFL for tasks assessed/performed Overall Cognitive Status: Within Functional Limits for tasks assessed                     General Comments       Exercises   Shoulder Instructions      Home Living Family/patient expects to be discharged to:: Private residence Living Arrangements: Spouse/significant other Available Help at Discharge: Family;Available 24 hours/day Type of Home: House Home Access: Ramped entrance     Home Layout: Two level;Able to live on main level with bedroom/bathroom     Bathroom Shower/Tub: Walk-in shower                    Prior Functioning/Environment Level of Independence: Independent        Comments: Indep with ADLs, household and community mobility; denies additional recent fall history.  Caregiver for 78 year old mother and 33 year old husband (runs errands, etc; no physical assist required)        OT Problem List: Decreased strength;Decreased knowledge of use of DME or AE;Decreased range of motion;Decreased activity tolerance;Impaired balance (sitting and/or standing)   OT Treatment/Interventions: Self-care/ADL training;Therapeutic exercise;Patient/family education;DME and/or AE instruction;Therapeutic activities;Energy conservation    OT Goals(Current goals can be found in the care plan section) Acute Rehab OT Goals Patient Stated Goal: To return to PLOF OT Goal Formulation: With patient Potential to Achieve Goals: Good  OT Frequency: Min 1X/week   Barriers to D/C:  Co-evaluation              End of Session    Activity Tolerance: Patient tolerated treatment well Patient left: with call bell/phone within reach;in chair;with chair alarm set   Time: [id number] OT Time Calculation (min): 25 min Charges:  OT Evaluation $OT Eval Moderate Complexity: 1 Procedure OT Treatments $Self Care/Home Management : 8-22 mins G-Codes:    Harrel Carina, MS, OTR/L 01/03/2016, 2:04 PM

## 2016-01-03 NOTE — Progress Notes (Signed)
Physical Therapy Treatment Patient Details Name: Melissa [name] MRN: [id number] DOB: 01/17/1941 Today's Date: 01/03/2016    History of Present Illness presented to ER status post fall in home environment (down for 2-3 hours) with L hip fracture; admitted status post L hip hemiarthroplasty, 01/02/16, WBAT.    PT Comments    Limited progression this PM due to pain/fatigue; unable to tolerate activity beyond bed/chair.  Very slow and guarded with all mobility. Question ability to progress towards mobility required to facilitate safe discharge home at this time; discharge recommendations changed to reflect consideration for STR at discharge.  CSW informed/aware.   Follow Up Recommendations  SNF     Equipment Recommendations  Rolling walker with 5" wheels    Recommendations for Other Services       Precautions / Restrictions Precautions Precautions: Fall;Posterior Hip Restrictions Weight Bearing Restrictions: Yes LLE Weight Bearing: Weight bearing as tolerated    Mobility  Bed Mobility Overal bed mobility: Needs Assistance Bed Mobility: Sit to Supine       Sit to supine: Mod assist;Max assist   General bed mobility comments: total assist for LE elevation over edge of bed  Transfers Overall transfer level: Needs assistance Equipment used: Rolling walker (2 wheeled) Transfers: Sit to/from Stand Sit to Stand: Min assist         General transfer comment: very slow and guarded; heavy use of UEs with min cuing for placement  Ambulation/Gait Ambulation/Gait assistance: Min assist Ambulation Distance (Feet): 8 Feet Assistive device: Rolling walker (2 wheeled)       General Gait Details: very slow and guarded, limited step height/length and foot clearance bilat. Unable to tolerate additional distance due to pain/fatigue   Stairs            Wheelchair Mobility    Modified Rankin (Stroke Patients Only)       Balance Overall balance assessment: Needs  assistance Sitting-balance support: No upper extremity supported;Feet supported Sitting balance-Leahy Scale: Good     Standing balance support: Bilateral upper extremity supported Standing balance-Leahy Scale: Fair                      Cognition Arousal/Alertness: Lethargic Behavior During Therapy: WFL for tasks assessed/performed Overall Cognitive Status: Within Functional Limits for tasks assessed                      Exercises Other Exercises Other Exercises: Unable to tolerate additional therex/theract this PM due to fatigue    General Comments        Pertinent Vitals/Pain Pain Assessment: 0-10 Pain Score: 4  Pain Location: L hip Pain Descriptors / Indicators: Aching Pain Intervention(s): Limited activity within patient's tolerance;Monitored during session;Repositioned    Home Living Family/patient expects to be discharged to:: Private residence Living Arrangements: Spouse/significant other Available Help at Discharge: Family;Available 24 hours/day Type of Home: House Home Access: Ramped entrance   Home Layout: Two level;Able to live on main level with bedroom/bathroom        Prior Function Level of Independence: Independent      Comments: Indep with ADLs, household and community mobility; denies additional recent fall history.  Caregiver for 68 year old mother and 76 year old husband (runs errands, etc; no physical assist required)   PT Goals (current goals can now be found in the care plan section) Acute Rehab PT Goals Patient Stated Goal: To return to PLOF PT Goal Formulation: With patient Time For Goal Achievement: 01/17/16  Potential to Achieve Goals: Good Progress towards PT goals: Progressing toward goals    Frequency    BID      PT Plan Discharge plan needs to be updated    Co-evaluation             End of Session Equipment Utilized During Treatment: Gait belt Activity Tolerance: Patient limited by fatigue Patient  left: in bed;with call bell/phone within reach;with bed alarm set;with family/visitor present     Time: 1431-1451 PT Time Calculation (min) (ACUTE ONLY): 20 min  Charges:  $Therapeutic Activity: 8-22 mins                    G Codes:       Zlaty Alexa H. Owens Shark, PT, DPT, NCS 01/03/16, 4:27 PM [phone]

## 2016-01-03 NOTE — Brief Op Note (Signed)
01/03/2016  1:00 AM  PATIENT:  Melissa [name]  75 y.o. female  PRE-OPERATIVE DIAGNOSIS:  left femoral neck fracture  POST-OPERATIVE DIAGNOSIS:  left femoral neck fracture  PROCEDURE: Left hip hemiarthroplasty    SURGEON:  Surgeon(s) and Role:    * Dereck Leep, MD - Primary  ASSISTANTS: none   ANESTHESIA:   spinal  EBL:  Total I/O In: 1000 [I.V.:1000] Out: 900 [Urine:400; Blood:500]  BLOOD ADMINISTERED:none  DRAINS: 2 medium Hemovac   LOCAL MEDICATIONS USED:  NONE  SPECIMEN:  Source of Specimen:  Left femoral head  DISPOSITION OF SPECIMEN:  PATHOLOGY  COUNTS:  YES  TOURNIQUET:  * No tourniquets in log *  DICTATION: .Dragon Dictation  PLAN OF CARE: Admit to inpatient   PATIENT DISPOSITION:  PACU - hemodynamically stable.   Delay start of Pharmacological VTE agent (>24hrs) due to surgical blood loss or risk of bleeding: yes

## 2016-01-03 NOTE — Anesthesia Postprocedure Evaluation (Signed)
Anesthesia Post Note  Patient: Melissa [name]  Procedure(s) Performed: Procedure(s) (LRB): ARTHROPLASTY BIPOLAR HIP (HEMIARTHROPLASTY) (Left)  Patient location during evaluation: Nursing Unit Anesthesia Type: Spinal Level of consciousness: awake Pain management: pain level controlled Vital Signs Assessment: post-procedure vital signs reviewed and stable Respiratory status: spontaneous breathing Cardiovascular status: blood pressure returned to baseline Postop Assessment: no headache Anesthetic complications: no    Last Vitals:  Vitals:   01/03/16 0522 01/03/16 0624  BP: (!) 121/52 (!) 118/52  Pulse: 74 75  Resp: 18 18  Temp: 36.4 C 36.5 C    Last Pain:  Vitals:   01/03/16 0624  TempSrc: Oral  PainSc:                  Buckner Malta

## 2016-01-03 NOTE — Progress Notes (Signed)
  Subjective: 1 Day Post-Op Procedure(s) (LRB): ARTHROPLASTY BIPOLAR HIP (HEMIARTHROPLASTY) (Left) Patient reports pain as moderate.   Patient seen in rounds with Dr. Marry Guan. Patient is well, and has had no acute complaints or problems Plan is to go Rehab after hospital stay. Negative for chest pain and shortness of breath Fever: no Gastrointestinal: Negative for nausea and vomiting  Objective: Vital signs in last 24 hours: Temp:  [97.6 F (36.4 C)-98.6 F (37 C)] 97.7 F (36.5 C) (10/19 0624) Pulse Rate:  [66-81] 75 (10/19 0624) Resp:  [13-18] 18 (10/19 0624) BP: (108-141)/(52-66) 118/52 (10/19 0624) SpO2:  [93 %-100 %] 93 % (10/19 0624)  Intake/Output from previous day:  Intake/Output Summary (Last 24 hours) at 01/03/16 0639 Last data filed at 01/03/16 0527  Gross per 24 hour  Intake          2329.58 ml  Output             3520 ml  Net         -1190.42 ml    Intake/Output this shift: Total I/O In: 1303.3 [I.V.:1303.3] Out: 970 [Urine:470; Blood:500]  Labs:  Recent Labs  01/01/16 2323 01/02/16 0357 01/03/16 0442  HGB 14.4 14.4 12.9    Recent Labs  01/02/16 0357 01/03/16 0442  WBC 12.7* 14.7*  RBC 4.40 3.88  HCT 41.7 36.8  PLT 204 168    Recent Labs  01/02/16 0357 01/03/16 0442  NA 140 138  K 3.6 3.5  CL 104 107  CO2 28 24  BUN 14 12  CREATININE 0.53 0.60  GLUCOSE 126* 136*  CALCIUM 9.1 8.4*   No results for input(s): LABPT, INR in the last 72 hours.   EXAM General - Patient is Alert and Oriented Extremity - Neurovascular intact Sensation intact distally Dorsiflexion/Plantar flexion intact No cellulitis present Compartment soft Dressing/Incision - clean, dry, no drainage Motor Function - intact, moving foot and toes well on exam.   Past Medical History:  Diagnosis Date  . Stroke [facility])     Assessment/Plan: 1 Day Post-Op Procedure(s) (LRB): ARTHROPLASTY BIPOLAR HIP (HEMIARTHROPLASTY) (Left) Active Problems:   Closed left hip  fracture (HCC)  Estimated body mass index is 28.53 kg/m as calculated from the following:   Height as of this encounter: 5\' 2"  (1.575 m).   Weight as of this encounter: 70.8 kg (156 lb). Advance diet Up with therapy D/C IV fluids Discharge to SNF the patient is safe and cleared by medicine.  DVT Prophylaxis - Lovenox, Foot Pumps and TED hose Weight-Bearing as tolerated to left leg  Reche Dixon, PA-C Orthopaedic Surgery 01/03/2016, 6:39 AM \

## 2016-01-04 ENCOUNTER — Encounter: Payer: Self-pay | Admitting: Orthopedic Surgery

## 2016-01-04 LAB — BASIC METABOLIC PANEL
ANION GAP: 8 (ref 5–15)
BUN: 8 mg/dL (ref 6–20)
CALCIUM: 8.5 mg/dL — AB (ref 8.9–10.3)
CO2: 27 mmol/L (ref 22–32)
Chloride: 102 mmol/L (ref 101–111)
Creatinine, Ser: 0.59 mg/dL (ref 0.44–1.00)
GFR calc Af Amer: >60 mL/min (ref >60)
GLUCOSE: 132 mg/dL — AB (ref 65–99)
POTASSIUM: 3.2 mmol/L — AB (ref 3.5–5.1)
SODIUM: 137 mmol/L (ref 135–145)

## 2016-01-04 LAB — CBC
HCT: 36.8 % (ref 35.0–47.0)
Hemoglobin: 12.7 g/dL (ref 12.0–16.0)
MCH: 32.7 pg (ref 26.0–34.0)
MCHC: 34.5 g/dL (ref 32.0–36.0)
MCV: 94.8 fL (ref 80.0–100.0)
PLATELETS: 173 10*3/uL (ref 150–440)
RBC: 3.88 MIL/uL (ref 3.80–5.20)
RDW: 12.8 % (ref 11.5–14.5)
WBC: 13.2 10*3/uL — AB (ref 3.6–11.0)

## 2016-01-04 LAB — SURGICAL PATHOLOGY

## 2016-01-04 MED ORDER — TRAMADOL HCL 50 MG PO TABS: 50.0000 mg | ORAL_TABLET | ORAL | 1 refills | Status: DC | PRN

## 2016-01-04 MED ORDER — OXYCODONE HCL 5 MG PO TABS: 5.0000 mg | ORAL_TABLET | ORAL | 0 refills | Status: DC | PRN

## 2016-01-04 MED ORDER — ENOXAPARIN SODIUM 40 MG/0.4ML ~~LOC~~ SOLN: 40.0000 mg | SUBCUTANEOUS | 0 refills | Status: DC

## 2016-01-04 MED ORDER — ASPIRIN EC 325 MG PO TBEC
325.0000 mg | DELAYED_RELEASE_TABLET | Freq: Every day | ORAL | Status: DC
  Administered 2016-01-04 – 2016-01-05 (×2): 325 mg via ORAL
  Filled 2016-01-04 (×2): qty 1

## 2016-01-04 MED ORDER — POTASSIUM CHLORIDE CRYS ER 20 MEQ PO TBCR
40.0000 meq | EXTENDED_RELEASE_TABLET | Freq: Once | ORAL | Status: AC
  Administered 2016-01-04: 40 meq via ORAL
  Filled 2016-01-04: qty 2

## 2016-01-04 NOTE — Progress Notes (Signed)
Physical Therapy Treatment Patient Details Name: Melissa [name] MRN: [id number] DOB: [birth year] Today's Date: 01/04/2016    History of Present Illness presented to ER status post fall in home environment (down for 2-3 hours) with L hip fracture; admitted status post L hip hemiarthroplasty, 01/02/16, WBAT.    PT Comments    Progressive increase in gait distance; constant cuing for increased cadence, R LE step length to optimize gait mechanics and L LE stance time/weight acceptance.  Heavy WBing bilat UEs; however, appears to be developing increased comfort/confidence with mobility performance.   Follow Up Recommendations  SNF     Equipment Recommendations  Rolling walker with 5" wheels    Recommendations for Other Services       Precautions / Restrictions Precautions Precautions: Fall;Posterior Hip Restrictions Weight Bearing Restrictions: Yes LLE Weight Bearing: Weight bearing as tolerated    Mobility  Bed Mobility   Bed Mobility: Sit to Supine       Sit to supine: Min assist;Mod assist   General bed mobility comments: for management of L LE over edge of bed; assist for adherence to THPs  Transfers Overall transfer level: Needs assistance Equipment used: Rolling walker (2 wheeled) Transfers: Sit to/from Stand Sit to Stand: Min assist         General transfer comment: very slow and guarded; heavy use of UEs with min cuing for placement  Ambulation/Gait Ambulation/Gait assistance: Min assist Ambulation Distance (Feet): 40 Feet Assistive device: Rolling walker (2 wheeled)       General Gait Details: intermittent step to vs. partially reciprocal gait pattern.  Min cuing for increased cadence and increased R LE step length.  Remains slow and guarded, but improving with each session.   Stairs            Wheelchair Mobility    Modified Rankin (Stroke Patients Only)       Balance Overall balance assessment: Needs assistance Sitting-balance  support: No upper extremity supported;Feet supported Sitting balance-Leahy Scale: Good     Standing balance support: Bilateral upper extremity supported Standing balance-Leahy Scale: Fair                      Cognition Arousal/Alertness: Awake/alert Behavior During Therapy: WFL for tasks assessed/performed Overall Cognitive Status: Within Functional Limits for tasks assessed                      Exercises Other Exercises Other Exercises: Toilet transfer, ambulatory with RW, cga/close sup (min verbal cuing for L LE position with turn negotiation); sit/stand from [facility] with RW, cga/close sup.    General Comments        Pertinent Vitals/Pain Pain Assessment: 0-10 Pain Score: 3  Pain Location: L hip Pain Descriptors / Indicators: Aching Pain Intervention(s): Limited activity within patient's tolerance;Monitored during session;Repositioned    Home Living                      Prior Function            PT Goals (current goals can now be found in the care plan section) Acute Rehab PT Goals Patient Stated Goal: To return to PLOF PT Goal Formulation: With patient Time For Goal Achievement: 01/17/16 Potential to Achieve Goals: Good Progress towards PT goals: Progressing toward goals    Frequency    BID      PT Plan Current plan remains appropriate    Co-evaluation  End of Session Equipment Utilized During Treatment: Gait belt Activity Tolerance: Patient tolerated treatment well Patient left: in bed;with bed alarm set;with call bell/phone within reach     Time: 1340-1403 PT Time Calculation (min) (ACUTE ONLY): 23 min  Charges:  $Gait Training: 8-22 mins                    G Codes:       Svara Twyman H. Owens Shark, PT, DPT, NCS 01/04/16, 2:44 PM [phone]

## 2016-01-04 NOTE — Progress Notes (Signed)
Foley removed per protocol; encouraged increase in po fluids. Voiced understanding; IV converted to SL. Barbaraann Faster, RN 10/20/20176:41 AM

## 2016-01-04 NOTE — Progress Notes (Signed)
Physical Therapy Treatment Patient Details Name: Melissa [name] MRN: [id number] DOB: [birth year] Today's Date: 01/04/2016    History of Present Illness presented to ER status post fall in home environment (down for 2-3 hours) with L hip fracture; admitted status post L hip hemiarthroplasty, 01/02/16, WBAT.    PT Comments    Patient with good efforts throughout session, but continues to mobilize at very slow, guarded pace, requiring min assist from therapist at all times.   Unable to tolerate distances outside of room environment; unable to safely mobilize home environment at this time.  Continue to recommend transition to STR for more frequent, intensive therapy post acute stay.  Follow Up Recommendations  SNF     Equipment Recommendations  Rolling walker with 5" wheels    Recommendations for Other Services       Precautions / Restrictions Precautions Precautions: Fall;Posterior Hip Restrictions Weight Bearing Restrictions: Yes LLE Weight Bearing: Weight bearing as tolerated    Mobility  Bed Mobility               General bed mobility comments: seated in recliner beginning/end of session  Transfers Overall transfer level: Needs assistance Equipment used: Rolling walker (2 wheeled) Transfers: Sit to/from Stand Sit to Stand: Min guard;Min assist         General transfer comment: very slow and guarded; heavy use of UEs with min cuing for placement  Ambulation/Gait Ambulation/Gait assistance: Min assist Ambulation Distance (Feet): 25 Feet Assistive device: Rolling walker (2 wheeled)       General Gait Details: step to gait pattern with very decreased stance time/weight acceptance to L LE; intermittently slides L LE vs. steps L LE during limb advancement.  Heavy WBing bilat UEs throughout; requiring 3-4 standing rest breaks to complete distance.   Stairs            Wheelchair Mobility    Modified Rankin (Stroke Patients Only)       Balance  Overall balance assessment: Needs assistance Sitting-balance support: No upper extremity supported;Feet supported Sitting balance-Leahy Scale: Good     Standing balance support: Bilateral upper extremity supported Standing balance-Leahy Scale: Fair                      Cognition Arousal/Alertness: Awake/alert Behavior During Therapy: WFL for tasks assessed/performed Overall Cognitive Status: Within Functional Limits for tasks assessed                      Exercises Other Exercises Other Exercises: Seated LE therex, 1x15, AROM for muscular strength/endurance: very small range L hip movements due to pain. Other Exercises: Reviewed THPs for joint protection-patient able to indep recall 1/3; 3/3 with cuing.  Min instruction from therapist throughout session for adherence with functional activities.    General Comments        Pertinent Vitals/Pain Pain Assessment: 0-10 Pain Score: 4  Pain Location: L hip Pain Intervention(s): Limited activity within patient's tolerance;Monitored during session;Repositioned;RN gave pain meds during session    Home Living                      Prior Function            PT Goals (current goals can now be found in the care plan section) Acute Rehab PT Goals Patient Stated Goal: To return to PLOF PT Goal Formulation: With patient Time For Goal Achievement: 01/17/16 Potential to Achieve Goals: Good Progress towards PT goals: Progressing  toward goals    Frequency    BID      PT Plan Current plan remains appropriate    Co-evaluation             End of Session Equipment Utilized During Treatment: Gait belt Activity Tolerance: Patient tolerated treatment well Patient left: in chair;with call bell/phone within reach;with chair alarm set     Time: [phone] PT Time Calculation (min) (ACUTE ONLY): 27 min  Charges:  $Gait Training: 8-22 mins $Therapeutic Exercise: 8-22 mins                    G Codes:       Melissa [name], PT, DPT, NCS 01/04/16, 10:13 AM [phone]

## 2016-01-04 NOTE — Clinical Social Work Note (Signed)
Clinical Social Work Assessment  Patient Details  Name: Melissa [name] MRN: [id number] Date of Birth: 24-May-1940  Date of referral:  01/04/16               Reason for consult:  Facility Placement                Permission sought to share information with:  Case Manager, Customer service manager, Family Supports Permission granted to share information::  Yes, Verbal Permission Granted  Name::        Agency::     Relationship::  Husband  Contact Information:     Housing/Transportation Living arrangements for the past 2 months:  Single Family Home Source of Information:  Patient Patient Interpreter Needed:  None Criminal Activity/Legal Involvement Pertinent to Current Situation/Hospitalization:  No - Comment as needed Significant Relationships:  Adult Children, Other Family Members, Friend Lives with:  Spouse Do you feel safe going back to the place where you live?  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  Patient at this time is needing more assistance regarding walking and help.  Patient was not agreeable to SNF on 10/19, however after speaking with MD and LCSW, she feels this would be the best option for patient.  She is in agreement with SNF.   Social Worker assessment / plan:  LCSW completed SNF work up for patient. Will follow up with bed offers. Plan will be discharge most likely Saturday. First choice:  [facility].   Employment status:  Retired Forensic scientist:  [employer] PT Recommendations:  [place] / Referral to community resources:  [place]  Patient/Family's Response to care:   Agreeable to plan  Patient/Family's Understanding of and Emotional Response to Diagnosis, Current Treatment, and Prognosis:  Patient tearful and wanting to go home, but understands realistically and  safely she needs 24 hour care for a few weeks.  Emotional Assessment Appearance:  Appears stated age Attitude/Demeanor/Rapport:  Apprehensive Affect (typically observed):  Accepting, Adaptable Orientation:  Oriented to Self, Oriented to Place, Oriented to  Time, Oriented to Situation Alcohol / Substance use:  Not Applicable Psych involvement (Current and /or in the community):  No (Comment)  Discharge Needs  Concerns to be addressed:  No discharge needs identified Readmission within the last 30 days:  No Current discharge risk:  None Barriers to Discharge:  No Barriers Identified   [place], LCSW 01/04/2016, 10:23 AM

## 2016-01-04 NOTE — Discharge Instructions (Signed)
POSTERIOR TOTAL HIP REPLACEMENT POSTOPERATIVE DIRECTIONS ° °Hip Rehabilitation, Guidelines Following Surgery  °The results of a hip operation are greatly improved after range of motion and muscle strengthening exercises. Follow all safety measures which are given to protect your hip. If any of these exercises cause increased pain or swelling in your joint, decrease the amount until you are comfortable again. Then slowly increase the exercises. Call your caregiver if you have problems or questions.  ° °HOME CARE INSTRUCTIONS  °Remove items at home which could result in a fall. This includes throw rugs or furniture in walking pathways.  °· ICE to the affected hip every three hours for 30 minutes at a time and then as needed for pain and swelling.  Continue to use ice on the hip for pain and swelling from surgery. You may notice swelling that will progress down to the foot and ankle.  This is normal after surgery.  Elevate the leg when you are not up walking on it.   °· Continue to use the breathing machine which will help keep your temperature down.  It is common for your temperature to cycle up and down following surgery, especially at night when you are not up moving around and exerting yourself.  The breathing machine keeps your lungs expanded and your temperature down. ° °DIET °You may resume your previous home diet once your are discharged from the hospital. ° °DRESSING / WOUND CARE / SHOWERING °Keep the surgical dressing until follow up.  The dressing is water proof, so you can shower without any extra covering.  IF THE DRESSING FALLS OFF or the wound gets wet inside, change the dressing with sterile gauze.  Please use good hand washing techniques before changing the dressing.  Do not use any lotions or creams on the incision until instructed by your surgeon.   °You need to keep your dressing dry after discharge.   °Change the surgical dressing if needed with Physical Therapy and reapply a dry dressing each  time. ° ° ° °ACTIVITY °Walk with your walker as instructed. °Use walker as long as suggested by your caregivers. °Avoid periods of inactivity such as sitting longer than an hour when not asleep. This helps prevent blood clots.  °You may resume a sexual relationship in one month or when given the OK by your doctor.  °You may return to work once you are cleared by your doctor.  °Do not drive a car for 6 weeks or until released by you surgeon.  °Do not drive while taking narcotics. ° °WEIGHT BEARING °Weight bearing as tolerated with assist device (walker, cane, etc) as directed, use it as long as suggested by your surgeon or therapist, typically at least 4-6 weeks. ° °POSTOPERATIVE CONSTIPATION PROTOCOL °Constipation - defined medically as fewer than three stools per week and severe constipation as less than one stool per week. ° °One of the most common issues patients have following surgery is constipation.  Even if you have a regular bowel pattern at home, your normal regimen is likely to be disrupted due to multiple reasons following surgery.  Combination of anesthesia, postoperative narcotics, change in appetite and fluid intake all can affect your bowels.  In order to avoid complications following surgery, here are some recommendations in order to help you during your recovery period. ° °Colace (docusate) - Pick up an over-the-counter form of Colace or another stool softener and take twice a day as long as you are requiring postoperative pain medications.  Take with a   full glass of water daily.  If you experience loose stools or diarrhea, hold the colace until you stool forms back up.  If your symptoms do not get better within 1 week or if they get worse, check with your doctor. ° °Dulcolax (bisacodyl) - Pick up over-the-counter and take as directed by the product packaging as needed to assist with the movement of your bowels.  Take with a full glass of water.  Use this product as needed if not relieved by Colace  only.  ° °MiraLax (polyethylene glycol) - Pick up over-the-counter to have on hand.  MiraLax is a solution that will increase the amount of water in your bowels to assist with bowel movements.  Take as directed and can mix with a glass of water, juice, soda, coffee, or tea.  Take if you go more than two days without a movement. °Do not use MiraLax more than once per day. Call your doctor if you are still constipated or irregular after using this medication for 7 days in a row. ° °If you continue to have problems with postoperative constipation, please contact the office for further assistance and recommendations.  If you experience "the worst abdominal pain ever" or develop nausea or vomiting, please contact the office immediatly for further recommendations for treatment. ° °ITCHING ° If you experience itching with your medications, try taking only a single pain pill, or even half a pain pill at a time.  You can also use Benadryl over the counter for itching or also to help with sleep.  ° °TED HOSE STOCKINGS °Wear the elastic stockings on both legs for three weeks following surgery during the day but you may remove then at night for sleeping. ° °MEDICATIONS °See your medication summary on the “After Visit Summary” that the nursing staff will review with you prior to discharge.  You may have some home medications which will be placed on hold until you complete the course of blood thinner medication.  It is important for you to complete the blood thinner medication as prescribed by your surgeon.  Continue your approved medications as instructed at time of discharge. ° °PRECAUTIONS °If you experience chest pain or shortness of breath - call 911 immediately for transfer to the hospital emergency department.  °If you develop a fever greater that 101 F, purulent drainage from wound, increased redness or drainage from wound, foul odor from the wound/dressing, or calf pain - CONTACT YOUR SURGEON.   °                                                 °FOLLOW-UP APPOINTMENTS °Make sure you keep all of your appointments after your operation with your surgeon and caregivers. You should call the office at the above phone number and make an appointment for approximately two weeks after the date of your surgery or on the date instructed by your surgeon outlined in the "After Visit Summary". ° °RANGE OF MOTION AND STRENGTHENING EXERCISES  °These exercises are designed to help you keep full movement of your hip joint. Follow your caregiver's or physical therapist's instructions. Perform all exercises about fifteen times, three times per day or as directed. Exercise both hips, even if you have had only one joint replacement. These exercises can be done on a training (exercise) mat, on the floor, on a table or on a   bed. Use whatever works the best and is most comfortable for you. Use music or television while you are exercising so that the exercises are a pleasant break in your day. This will make your life better with the exercises acting as a break in routine you can look forward to.  °Lying on your back, slowly slide your foot toward your buttocks, raising your knee up off the floor. Then slowly slide your foot back down until your leg is straight again.  °Lying on your back spread your legs as far apart as you can without causing discomfort.  °Lying on your side, raise your upper leg and foot straight up from the floor as far as is comfortable. Slowly lower the leg and repeat.  °Lying on your back, tighten up the muscle in the front of your thigh (quadriceps muscles). You can do this by keeping your leg straight and trying to raise your heel off the floor. This helps strengthen the largest muscle supporting your knee.  °Lying on your back, tighten up the muscles of your buttocks both with the legs straight and with the knee bent at a comfortable angle while keeping your heel on the floor.  ° ° ° ° °IF YOU ARE TRANSFERRED TO A SKILLED REHAB  FACILITY °If the patient is transferred to a skilled rehab facility following release from the hospital, a list of the current medications will be sent to the facility for the patient to continue.  When discharged from the skilled rehab facility, please have the facility set up the patient's Home Health Physical Therapy prior to being released. Also, the skilled facility will be responsible for providing the patient with their medications at time of release from the facility to include their pain medication, the muscle relaxants, and their blood thinner medication. If the patient is still at the rehab facility at time of the two week follow up appointment, the skilled rehab facility will also need to assist the patient in arranging follow up appointment in our office and any transportation needs. ° °MAKE SURE YOU:  °Understand these instructions.  °Get help right away if you are not doing well or get worse.  ° ° °Pick up stool softner and laxative for home use following surgery while on pain medications. °Do not submerge incision under water. °Please use good hand washing techniques while changing dressing each day. °May shower starting three days after surgery. °Please use a clean towel to pat the incision dry following showers. °Continue to use ice for pain and swelling after surgery. °Do not use any lotions or creams on the incision until instructed by your surgeon. °

## 2016-01-04 NOTE — Progress Notes (Signed)
Patient has a bed offer at [facility]. Confirmed with facility. DC on Saturday. Facility to follow up with bed and report number this afternoon.  Lane Hacker, MSW Clinical Social Work: [employer]maker

## 2016-01-04 NOTE — Care Management (Signed)
Patient now agrees to go to SNF. CSW working on that. RNCM to cancel Lovenox with Outpatient pharmacy and DME with [facility] if patient goes to SNF.

## 2016-01-04 NOTE — NC FL2 (Signed)
[place] LEVEL OF CARE SCREENING TOOL     IDENTIFICATION  Patient Name: Melissa [name] Birthdate: 1940-11-02 Sex: female Admission Date (Current Location): 01/01/2016  [place] and [place] Number:  Engineering geologist and Address:  [facility], [address] [place], [place] [address]      Provider Number: [id number]  Attending Physician Name and Address:  Epifanio Lesches, MD  Relative Name and Phone Number:       Current Level of Care: Hospital Recommended Level of Care: [place] Prior Approval Number:    Date Approved/Denied:   PASRR Number:   [id number]  Discharge Plan: SNF    Current Diagnoses: Patient Active Problem List   Diagnosis Date Noted  . Closed left hip fracture ([place]) 01/01/2016    Orientation RESPIRATION BLADDER Height & Weight     Self, Time, Situation, Place  Normal Continent Weight: 156 lb (70.8 kg) Height:  5\' 2"  (157.5 cm)  BEHAVIORAL SYMPTOMS/MOOD NEUROLOGICAL BOWEL NUTRITION STATUS      Continent Diet (regular)  AMBULATORY STATUS COMMUNICATION OF NEEDS Skin   Extensive Assist Verbally Surgical wounds, Skin abrasions                       Personal Care Assistance Level of Assistance  Bathing, Feeding, Dressing Bathing Assistance: Limited assistance Feeding assistance: Independent Dressing Assistance: Limited assistance     Functional Limitations Info  Sight, Hearing, Speech Sight Info: Adequate Hearing Info: Adequate Speech Info: Adequate    SPECIAL CARE FACTORS FREQUENCY  PT (By licensed PT), OT (By licensed OT)     PT Frequency: 5 OT Frequency: 5            Contractures Contractures Info: Not present    Additional Factors Info  Code Status, Allergies Code Status Info: Full Code Allergies Info: NKA           Current Medications (01/04/2016):  This is the current hospital active medication list Current Facility-Administered Medications   Medication Dose Route Frequency Provider Last Rate Last Dose  . acetaminophen (TYLENOL) tablet 650 mg  650 mg Oral Q6H PRN Alexis Hugelmeyer, DO       Or  . acetaminophen (TYLENOL) suppository 650 mg  650 mg Rectal Q6H PRN Alexis Hugelmeyer, DO      . amLODipine (NORVASC) tablet 10 mg  10 mg Oral Daily Alexis Hugelmeyer, DO   10 mg at 01/04/16 0929  . bisacodyl (DULCOLAX) suppository 10 mg  10 mg Rectal Daily PRN Dereck Leep, MD      . enoxaparin (LOVENOX) injection 40 mg  40 mg Subcutaneous Q24H Dereck Leep, MD   40 mg at 01/04/16 0801  . ferrous sulfate tablet 325 mg  325 mg Oral BID WC Dereck Leep, MD   325 mg at 01/04/16 0801  . hydrochlorothiazide (HYDRODIURIL) tablet 25 mg  25 mg Oral Daily Alexis Hugelmeyer, DO   25 mg at 01/04/16 0929  . latanoprost (XALATAN) 0.005 % ophthalmic solution 1 drop  1 drop Both Eyes QHS Alexis Hugelmeyer, DO   1 drop at 01/03/16 2317  . magnesium hydroxide (MILK OF MAGNESIA) suspension 30 mL  30 mL Oral Daily PRN Dereck Leep, MD      . menthol-cetylpyridinium (CEPACOL) lozenge 3 mg  1 lozenge Oral PRN Dereck Leep, MD       Or  . phenol (CHLORASEPTIC) mouth spray 1 spray  1 spray Mouth/Throat PRN Laurice Record  Hooten, MD      . metoCLOPramide (REGLAN) tablet 5-10 mg  5-10 mg Oral Q8H PRN Dereck Leep, MD       Or  . metoCLOPramide (REGLAN) injection 5-10 mg  5-10 mg Intravenous Q8H PRN Dereck Leep, MD      . metoCLOPramide (REGLAN) tablet 10 mg  10 mg Oral TID AC & HS Dereck Leep, MD   10 mg at 01/04/16 0801  . morphine 2 MG/ML injection 2-4 mg  2-4 mg Intravenous Q4H PRN Dereck Leep, MD      . multivitamin with minerals tablet 1 tablet  1 tablet Oral Daily Alexis Hugelmeyer, DO   1 tablet at 01/04/16 0929  . ondansetron (ZOFRAN) tablet 4 mg  4 mg Oral Q6H PRN Dereck Leep, MD       Or  . ondansetron (ZOFRAN) injection 4 mg  4 mg Intravenous Q6H PRN Dereck Leep, MD      . oxyCODONE (Oxy IR/ROXICODONE) immediate release tablet 5-10  mg  5-10 mg Oral Q4H PRN Dereck Leep, MD   5 mg at 01/04/16 0415  . pantoprazole (PROTONIX) EC tablet 40 mg  40 mg Oral BID Dereck Leep, MD   40 mg at 01/04/16 0929  . pravastatin (PRAVACHOL) tablet 20 mg  20 mg Oral Daily Alexis Hugelmeyer, DO   20 mg at 01/04/16 0929  . senna-docusate (Senokot-S) tablet 1 tablet  1 tablet Oral BID Dereck Leep, MD   1 tablet at 01/04/16 0930  . sodium phosphate (FLEET) 7-19 GM/118ML enema 1 enema  1 enema Rectal Once PRN Dereck Leep, MD      . timolol (BETIMOL) 0.25 % ophthalmic solution 1 drop  1 drop Both Eyes Daily Alexis Hugelmeyer, DO   1 drop at 01/02/16 1000  . traMADol (ULTRAM) tablet 50-100 mg  50-100 mg Oral Q4H PRN Dereck Leep, MD   100 mg at 01/04/16 0929  . vitamin E capsule 400 Units  400 Units Oral Daily Alexis Hugelmeyer, DO   400 Units at 01/04/16 0929  . zolpidem (AMBIEN) tablet 5 mg  5 mg Oral QHS PRN Alexis Hugelmeyer, DO         Discharge Medications: Please see discharge summary for a list of discharge medications.  Relevant Imaging Results:  Relevant Lab Results:   Additional Information SSN: [id number]  Lilly Cove, [place]

## 2016-01-04 NOTE — Clinical Social Work Placement (Addendum)
   CLINICAL SOCIAL WORK PLACEMENT  NOTE  Date:  01/04/2016  Patient Details  Name: Melissa [name] MRN: [id number] Date of Birth: 05-04-1940  Clinical Social Work is seeking post-discharge placement for this patient at the [place] level of care (*CSW will initial, date and re-position this form in  chart as items are completed):  Yes   Patient/family provided with [place] Work Department's list of facilities offering this level of care within the geographic area requested by the patient (or if unable, by the patient's family).  Yes   Patient/family informed of their freedom to choose among providers that offer the needed level of care, that participate in Medicare, Medicaid or managed care program needed by the patient, have an available bed and are willing to accept the patient.  Yes   Patient/family informed of [place]'s ownership interest in [facility] and [facility], as well as of the fact that they are under no obligation to receive care at these facilities.  PASRR submitted to EDS on 01/04/16     PASRR number received on 01/04/16     Existing PASRR number confirmed on       FL2 transmitted to all facilities in geographic area requested by pt/family on 01/04/16     FL2 transmitted to all facilities within larger geographic area on       Patient informed that his/her managed care company has contracts with or will negotiate with certain facilities, including the following:            Patient/family informed of bed offers received.  01/04/2016   Patient chooses bed at      [facility]  Physician recommends and patient chooses bed at      Patient to be transferred to   on  .  Patient to be transferred to facility by       Patient family notified on   of transfer.  Name of family member notified:        PHYSICIAN Please sign FL2     Additional Comment:     _______________________________________________ Lilly Cove, LCSW 01/04/2016, 10:29 AM

## 2016-01-04 NOTE — Clinical Social Work Note (Signed)
MSW was informed by [facility] that they can no longer except patient over the weekend.  MSW spoke to patient and her family to present other bed offers.  Patient and family chose [place], [place], MSW contacted [facility] who can accept patient on Saturday.  MSW to continue to follow patient's progress throughout discharge planning.  Jones Broom. Aalina Brege, MSW [phone]  Mon-Fri 8a-4:30p 01/04/2016 4:40 PM

## 2016-01-04 NOTE — Progress Notes (Signed)
  Subjective: 2 Days Post-Op Procedure(s) (LRB): ARTHROPLASTY BIPOLAR HIP (HEMIARTHROPLASTY) (Left) Patient reports pain as mild.   Patient seen in rounds with Dr. Marry Guan. Patient is well, and has had no acute complaints or problems Plan is to go home after hospital stay. Negative for chest pain and shortness of breath Fever: no Gastrointestinal: Negative for nausea and vomiting  Objective: Vital signs in last 24 hours: Temp:  [98.2 F (36.8 C)-98.9 F (37.2 C)] 98.2 F (36.8 C) (10/20 0402) Pulse Rate:  [76-92] 92 (10/20 0402) Resp:  [16] 16 (10/20 0402) BP: (118-132)/(41-65) 132/65 (10/20 0402) SpO2:  [93 %-95 %] 93 % (10/20 0402)  Intake/Output from previous day:  Intake/Output Summary (Last 24 hours) at 01/04/16 0727 Last data filed at 01/04/16 0600  Gross per 24 hour  Intake          5293.67 ml  Output             3150 ml  Net          2143.67 ml    Intake/Output this shift: No intake/output data recorded.  Labs:  Recent Labs  01/01/16 2323 01/02/16 0357 01/03/16 0442 01/04/16 0347  HGB 14.4 14.4 12.9 12.7    Recent Labs  01/03/16 0442 01/04/16 0347  WBC 14.7* 13.2*  RBC 3.88 3.88  HCT 36.8 36.8  PLT 168 173    Recent Labs  01/03/16 0442 01/04/16 0347  NA 138 137  K 3.5 3.2*  CL 107 102  CO2 24 27  BUN 12 8  CREATININE 0.60 0.59  GLUCOSE 136* 132*  CALCIUM 8.4* 8.5*   No results for input(s): LABPT, INR in the last 72 hours.   EXAM General - Patient is Alert and Oriented Extremity - Neurovascular intact Sensation intact distally Dorsiflexion/Plantar flexion intact No cellulitis present Compartment soft Dressing/Incision - clean, dry, no drainage. The Hemovac was removed with no complication. Motor Function - intact, moving foot and toes well on exam. The patient ambulated 8 feet with physical therapy.  Past Medical History:  Diagnosis Date  . Stroke [facility])     Assessment/Plan: 2 Days Post-Op Procedure(s) (LRB): ARTHROPLASTY  BIPOLAR HIP (HEMIARTHROPLASTY) (Left) Active Problems:   Closed left hip fracture (HCC)  Estimated body mass index is 28.53 kg/m as calculated from the following:   Height as of this encounter: 5\' 2"  (1.575 m).   Weight as of this encounter: 70.8 kg (156 lb). The plan is for the patient to go home with home health physical therapy. The patient will be discharged home when the patient is safe and cleared by medicine.  DVT Prophylaxis - Lovenox, Foot Pumps and TED hose Weight-Bearing as tolerated to left leg  Reche Dixon, PA-C Orthopaedic Surgery 01/04/2016, 7:27 AM \

## 2016-01-04 NOTE — Progress Notes (Signed)
Occupational Therapy Treatment Patient Details Name: Melissa [name] MRN: [id number] DOB: 06/08/1940 Today's Date: 01/04/2016    History of present illness Pt. was admitted for a Left Hip Hemiarthroplasty repair after sustaining a Left Hip Fracture.   OT comments  Pt. continues to be limited by pain. Pt. required verbal cues to recall 2/3 hip precautions. Pt. was able to demonstrate A/E use for LE ADLs with visual demonstration, and modA. Pt. Continues to benefit from skilled OT services for ADL, and IADL training, UE there.ex, functional mobility, and pt. Education about home modification/DME in order to improve ADL and IADL functioning. Pt. Could benefit from SNF level of care, with follow-up OT services.     Follow Up Recommendations  SNF    Equipment Recommendations       Recommendations for Other Services      Precautions / Restrictions Precautions Precautions: Fall;Posterior Hip Restrictions Weight Bearing Restrictions: Yes LLE Weight Bearing: Weight bearing as tolerated              ADL Overall ADL's : Needs assistance/impaired Eating/Feeding: Set up   Grooming: Set up               Lower Body Dressing: Moderate assistance                 General ADL Comments: Pt. continues to require assist for LE ADLs, and education in posterior Hip precautions, and A/E use for LE ADLs.      Vision                     Perception     Praxis      Cognition   Behavior During Therapy: WFL for tasks assessed/performed Overall Cognitive Status: Within Functional Limits for tasks assessed                       Extremity/Trunk Assessment               Exercises   Shoulder Instructions   General Comments      Pertinent Vitals/ Pain       Pain Assessment: 0-10 Pain Score: 4  Pain Location: Left Hip Pain Descriptors / Indicators: Aching Pain Intervention(s): Limited activity within patient's tolerance  Home Living                                           Prior Functioning/Environment              Frequency  Min 1X/week        Progress Toward Goals  OT Goals(current goals can now be found in the care plan section)     Acute Rehab OT Goals Patient Stated Goal: To return to [facility]  Plan Discharge plan needs to be updated    Co-evaluation                 End of Session     Activity Tolerance Patient tolerated treatment well   Patient Left with call bell/phone within reach;with chair alarm set   Nurse Communication          Time: [id number] OT Time Calculation (min): 20 min  Charges: OT General Charges $OT Visit: 1 Procedure OT Treatments $Self Care/Home Management : 8-22 mins  Harrel Carina, MS, OTR/L 01/04/2016, 11:58 AM

## 2016-01-04 NOTE — Care Management Important Message (Signed)
Important Message  Patient Details  Name: Melissa [name] MRN: [id number] Date of Birth: 11-Jan-1941   Medicare Important Message Given:  Yes    Marshell Garfinkel, RN 01/04/2016, 9:00 AM

## 2016-01-04 NOTE — Progress Notes (Signed)
[facility] [place] NAME: Melissa [name]    MR#:  [id number]  DATE OF BIRTH:  08-24-1940  SUBJECTIVE: doing better. 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  Came in after she had mechanical fall at home while trying to bring her flower pots in the garage. Found to have left  hip fracture  REVIEW OF SYSTEMS:   Review of Systems  Constitutional: Negative for chills, fever and weight loss.  HENT: Negative for ear discharge, ear pain and nosebleeds.   Eyes: Negative for blurred vision, pain and discharge.  Respiratory: Negative for sputum production, shortness of breath, wheezing and stridor.   Cardiovascular: Negative for chest pain, palpitations, orthopnea and PND.  Gastrointestinal: Negative for abdominal pain, diarrhea, nausea and vomiting.  Genitourinary: Negative for frequency and urgency.  Musculoskeletal: Positive for joint pain. Negative for back pain.  Neurological: Negative for sensory change, speech change, focal weakness and weakness.  Psychiatric/Behavioral: Negative for depression and hallucinations. The patient is not nervous/anxious.    Tolerating Diet:npo Tolerating PT: pending  DRUG ALLERGIES:  No Known Allergies  VITALS:  Blood pressure 132/65, pulse 92, temperature 98.2 F (36.8 C), temperature source Oral, resp. rate 16, height 5\' 2"  (1.575 m), weight 70.8 kg (156 lb), SpO2 93 %.  PHYSICAL EXAMINATION:   Physical Exam  GENERAL:  75 y.o.-year-old patient lying in the bed with no acute distress.  EYES: Pupils equal, round, reactive to light and accommodation. No scleral icterus. Extraocular muscles intact.  HEENT: Head atraumatic, normocephalic. Oropharynx and nasopharynx clear.  NECK:  Supple, no jugular venous distention. No thyroid enlargement, no tenderness.  LUNGS: Normal breath sounds bilaterally, no wheezing, rales, rhonchi. No use of accessory muscles of respiration.  CARDIOVASCULAR: S1, S2  normal. No murmurs, rubs, or gallops.  ABDOMEN: Soft, nontender, nondistended. Bowel sounds present. No organomegaly or mass.  EXTREMITIES: No cyanosis, clubbing or edema b/l.   Left hip JP drain present  NEUROLOGIC: Cranial nerves II through XII are intact. No focal Motor or sensory deficits b/l.   PSYCHIATRIC:  patient is alert and oriented x 3.  SKIN: No obvious rash, lesion, or ulcer.   LABORATORY PANEL:  CBC  Recent Labs Lab 01/04/16 0347  WBC 13.2*  HGB 12.7  HCT 36.8  PLT 173    Chemistries   Recent Labs Lab 01/04/16 0347  NA 137  K 3.2*  CL 102  CO2 27  GLUCOSE 132*  BUN 8  CREATININE 0.59  CALCIUM 8.5*   Cardiac Enzymes No results for input(s): TROPONINI in the last 168 hours. RADIOLOGY:  Dg Hip Port Unilat With Pelvis 1v Left  Result Date: 01/03/2016 CLINICAL DATA:  75 year old female with left hip hemiarthroplasty. EXAM: DG HIP (WITH OR WITHOUT PELVIS) 1V PORT LEFT COMPARISON:  Radiograph dated 01/01/2016 FINDINGS: There has been interval placement of a left femoral prostheses. The orthopedic hardware appears intact. There is no evidence of loosening. No acute fracture or dislocation identified. The head of the orthopedic hardware is in anatomic alignment with the acetabulum. The bones are osteopenic. The soft tissues appear unremarkable. A drainage catheter noted over the left hip. Cutaneous surgical clips noted. IMPRESSION: Postsurgical changes of left hip hemiarthroplasty. No acute fracture or dislocation. Electronically Signed   By: Anner Crete M.D.   On: 01/03/2016 02:18   ASSESSMENT AND PLAN:  75 y.o. female with a history of hypertension, hyperlipidemia, CVA now being admitted with:  1.Left Femoral neck fracture status post  repair with hemiarthroplasty last night.-Postop day 2,Continue physical therapy, DVT prophylaxis, possible d/c to [facility] tomorrow, 2. History of hypertension; controlled, 3. History of hyperlipidemia-continue pravastatin.    4. History of CVA-restart aspirin  5. Hypokalemia, mild;replace.   Case discussed with Care Management/Social Worker. Management plans discussed with the patient, family and they are in agreement.  CODE STATUS: full  DVT Prophylaxis: lovenox  TOTAL TIME TAKING CARE OF THIS PATIENT: 30 minutes.  >50% time spent on counselling and coordination of care pt and Dr Marry Guan  POSSIBLE D/C IN 2-3 DAYS, DEPENDING ON CLINICAL CONDITION.  Note: This dictation was prepared with Dragon dictation along with smaller phrase technology. Any transcriptional errors that result from this process are unintentional.  [name] M.D on 01/04/2016 at 11:51 AM  Between 7am to 6pm - Pager - [phone]  After 6pm go to [email or web address] - password [email or web address] [facility]  [facility]  Office  [phone]  CC: Primary care physician; Adin Hector, MD

## 2016-01-05 LAB — TYPE AND SCREEN
ABO/RH(D): A NEG
ANTIBODY SCREEN: POSITIVE
UNIT DIVISION: 0
UNIT DIVISION: 0

## 2016-01-05 LAB — ABO/RH: ABO/RH(D): A NEG

## 2016-01-05 LAB — POTASSIUM: POTASSIUM: 3.1 mmol/L — AB (ref 3.5–5.1)

## 2016-01-05 MED ORDER — POTASSIUM CHLORIDE CRYS ER 20 MEQ PO TBCR
40.0000 meq | EXTENDED_RELEASE_TABLET | Freq: Once | ORAL | Status: AC
  Administered 2016-01-05: 40 meq via ORAL
  Filled 2016-01-05: qty 2

## 2016-01-05 MED ORDER — POTASSIUM CHLORIDE 20 MEQ PO PACK: 40.0000 meq | PACK | Freq: Two times a day (BID) | ORAL | Status: DC

## 2016-01-05 MED ORDER — ONDANSETRON HCL 4 MG/2ML IJ SOLN: 4.0000 mg | Freq: Four times a day (QID) | INTRAMUSCULAR | 0 refills | Status: DC | PRN

## 2016-01-05 NOTE — Progress Notes (Signed)
Potasium 3.1. MD Vianne Bulls notified. Orders received for 1X 40 mEq PO  Khlor Con. Pt ok for d/c after PO K.

## 2016-01-05 NOTE — Progress Notes (Signed)
Report called to [place] at [facility].

## 2016-01-05 NOTE — Progress Notes (Signed)
EMS called for transportation.  

## 2016-01-05 NOTE — Progress Notes (Signed)
  Subjective: 3 Days Post-Op Procedure(s) (LRB): ARTHROPLASTY BIPOLAR HIP (HEMIARTHROPLASTY) (Left) Patient reports pain as mild.   Patient seen in rounds with Dr. Marry Guan. Patient is well, and has had no acute complaints or problems Plan is to go to rehabilitation after hospital stay. Negative for chest pain and shortness of breath Fever: no Gastrointestinal: Negative for nausea and vomiting  Objective: Vital signs in last 24 hours: Temp:  [98 F (36.7 C)-98.5 F (36.9 C)] 98 F (36.7 C) (10/21 0342) Pulse Rate:  [84-99] 84 (10/21 0342) Resp:  [16] 16 (10/21 0342) BP: (131-144)/(59-60) 131/60 (10/21 0342) SpO2:  [93 %-94 %] 94 % (10/21 0342)  Intake/Output from previous day:  Intake/Output Summary (Last 24 hours) at 01/05/16 0655 Last data filed at 01/04/16 1700  Gross per 24 hour  Intake              720 ml  Output                0 ml  Net              720 ml    Intake/Output this shift: No intake/output data recorded.  Labs:  Recent Labs  01/03/16 0442 01/04/16 0347  HGB 12.9 12.7    Recent Labs  01/03/16 0442 01/04/16 0347  WBC 14.7* 13.2*  RBC 3.88 3.88  HCT 36.8 36.8  PLT 168 173    Recent Labs  01/03/16 0442 01/04/16 0347  NA 138 137  K 3.5 3.2*  CL 107 102  CO2 24 27  BUN 12 8  CREATININE 0.60 0.59  GLUCOSE 136* 132*  CALCIUM 8.4* 8.5*   No results for input(s): LABPT, INR in the last 72 hours.   EXAM General - Patient is Alert and Oriented Extremity - Neurovascular intact Sensation intact distally Dorsiflexion/Plantar flexion intact No cellulitis present Compartment soft Dressing/Incision - clean, dry, no drainage.  Motor Function - intact, moving foot and toes well on exam. The patient ambulated 40 feet with physical therapy.  Past Medical History:  Diagnosis Date  . Stroke [facility])     Assessment/Plan: 3 Days Post-Op Procedure(s) (LRB): ARTHROPLASTY BIPOLAR HIP (HEMIARTHROPLASTY) (Left) Active Problems:   Closed left hip  fracture (HCC)  Estimated body mass index is 28.53 kg/m as calculated from the following:   Height as of this encounter: 5\' 2"  (1.575 m).   Weight as of this encounter: 70.8 kg (156 lb). The plan is for the patient to go to rehabilitation for physical therapy and occupational therapy. The patient will be discharged to rehabilitation when the patient is safe and cleared by medicine.  DVT Prophylaxis - Lovenox, Foot Pumps and TED hose Weight-Bearing as tolerated to left leg  Reche Dixon, PA-C Orthopaedic Surgery 01/05/2016, 6:55 AM

## 2016-01-05 NOTE — Progress Notes (Signed)
Clinical Social Worker was informed that patient will be medically ready to discharge to [facility]. Patient in a agreement with plan. CSW called Seth Bake- Admissions Coordinator at [facility] to confirm that patient's bed is ready. Provided patient's room number 226 and number to call for report [phone] . All discharge information faxed to [facility] via [place].  RN will call report and patient will discharge to [facility] via EMS.  Ernest Pine, MSW, LCSW, [place] Clinical Social Worker [phone]

## 2016-01-05 NOTE — Progress Notes (Signed)
PT Cancellation Note  Patient Details Name: Melissa [name] MRN: [id number] DOB: [birth year]   Cancelled Treatment:    Reason Eval/Treat Not Completed: Medical issues which prohibited therapy. Upon chart review prior to afternoon session, it was noted that patient's K+ dropped to 3.1, contraindicating PT. Will check back later if time permits.   Dorice Lamas, PT, DPT 01/05/2016, 12:29 PM

## 2016-01-05 NOTE — Clinical Social Work Placement (Signed)
   CLINICAL SOCIAL WORK PLACEMENT  NOTE  Date:  01/05/2016  Patient Details  Name: Melissa [name] MRN: [id number] Date of Birth: 1940-10-02  Clinical Social Work is seeking post-discharge placement for this patient at the [place] level of care (*CSW will initial, date and re-position this form in  chart as items are completed):  Yes   Patient/family provided with [place] Work Department's list of facilities offering this level of care within the geographic area requested by the patient (or if unable, by the patient's family).  Yes   Patient/family informed of their freedom to choose among providers that offer the needed level of care, that participate in Medicare, Medicaid or managed care program needed by the patient, have an available bed and are willing to accept the patient.  Yes   Patient/family informed of [facility]'s ownership interest in [facility] and [facility], as well as of the fact that they are under no obligation to receive care at these facilities.  PASRR submitted to EDS on 01/04/16     PASRR number received on 01/04/16     Existing PASRR number confirmed on       FL2 transmitted to all facilities in geographic area requested by pt/family on 01/04/16     FL2 transmitted to all facilities within larger geographic area on       Patient informed that his/her managed care company has contracts with or will negotiate with certain facilities, including the following:        Yes   Patient/family informed of bed offers received.  Patient chooses bed at  [facility])     Physician recommends and patient chooses bed at      Patient to be transferred to  [facility]) on 01/05/16.  Patient to be transferred to facility by  (EMS)     Patient family notified on 01/05/16 of transfer.  Name of family member notified:   Sonia Side - Husband)     PHYSICIAN Please sign FL2     Additional Comment:     _______________________________________________ Baldemar Lenis, LCSW 01/05/2016, 10:20 AM

## 2016-01-05 NOTE — Progress Notes (Addendum)
EMS here to transport pt. 

## 2016-01-05 NOTE — Discharge Summary (Signed)
Melissa [name], is a 75 y.o. female  DOB 01/25/1941  MRN [id number].  Admission date:  01/01/2016  Admitting Physician  Harvie Bridge, DO  Discharge Date:  01/05/2016   Primary MD  Hammond III, MD  Recommendations for primary care physician for things to follow:   follow  Up with primary doctor in one week Follow-up with Dr.  Marry Guan in 6 weeks for repeat x-rays   Admission Diagnosis  Closed fracture of left hip, initial encounter [facility]) [S72.002A] Fall, initial encounter [W19.XXXA]   Discharge Diagnosis  Closed fracture of left hip, initial encounter ([place]) [S72.002A] Fall, initial encounter [W19.XXXA]    Active Problems:   Closed left hip fracture [facility])      Past Medical History:  Diagnosis Date  . Stroke [facility])     Past Surgical History:  Procedure Laterality Date  . BREAST BIOPSY Right    core- stereo- neg  . HIP ARTHROPLASTY Left 01/02/2016   Procedure: ARTHROPLASTY BIPOLAR HIP (HEMIARTHROPLASTY);  Surgeon: Dereck Leep, MD;  Location: [facility] ORS;  Service: Orthopedics;  Laterality: Left;       History of present illness and  Hospital Course:     Kindly see H&P for history of present illness and admission details, please review complete Labs, Consult reports and Test reports for all details in brief  HPI  from the history and physical done on the day of admission 75 year old female patient with history of essential hypertension, hyperlipidemia, CVA came in because of the fall, suffered a left hip fracture.   Hospital Course  #1. Left femoral fracture status post left hip hemiarthroplasty by orthopedic on October 19. Did well postoperatively, physical therapy recommended rehabilitation placement. Family chose to  [facility] for rehab.she will  be discharged to [facility]  today. Continue pain  medicine, continue Lovenox for 14 days. Follow up with Dr. Marry Guan in 6 weeks for repeat x-rays of the hip. #2 history of CVA: Restarted the aspirin. #3 essential hypertension: BP is better now controlled continue Norvasc, HCTZ. #4 hyperlipidemia;continue statins     Discharge Condition: stable   Follow UP  Follow-up Information    HOOTEN,JAMES P, MD Follow up in 6 week(s).   Specialty:  Orthopedic Surgery Why:  X-rays and reevaluation Contact information: [address] [facility] [place] [address] [place] III, MD Follow up in 1 week(s).   Specialty:  Internal Medicine Contact information: [address] [place] [address] [phone]             Discharge Instructions  and  Discharge Medications      Medication List    TAKE these medications   amLODipine 10 MG tablet Commonly known as:  NORVASC Take 10 mg by mouth daily.   aspirin EC 325 MG tablet Take 325 mg by mouth daily.   CoQ10 100 MG Caps Take 100 mg by mouth daily.   enoxaparin 40 MG/0.4ML injection Commonly known as:  LOVENOX Inject 0.4 mLs (40 mg total) into the skin daily.   hydrochlorothiazide 25 MG tablet Commonly known as:  HYDRODIURIL Take 25 mg by mouth daily.   latanoprost 0.005 % ophthalmic solution Commonly known as:  XALATAN Place 1 drop into both eyes at bedtime.   multivitamin tablet Take 1 tablet by mouth daily.   ondansetron 4 MG/2ML Soln injection Commonly known as:  ZOFRAN Inject 2 mLs (4 mg total) into the  vein every 6 (six) hours as needed for nausea.   oxyCODONE 5 MG immediate release tablet Commonly known as:  Oxy IR/ROXICODONE Take 1-2 tablets (5-10 mg total) by mouth every 4 (four) hours as needed for breakthrough pain ((for MODERATE breakthrough pain)).   pravastatin 20 MG tablet Commonly known as:  PRAVACHOL Take 20 mg by mouth daily.   timolol 0.25 % ophthalmic  solution Commonly known as:  BETIMOL Place 1 drop into both eyes daily.   traMADol 50 MG tablet Commonly known as:  ULTRAM Take 1-2 tablets (50-100 mg total) by mouth every 4 (four) hours as needed for moderate pain.   triamcinolone cream 0.5 % Commonly known as:  KENALOG Apply 1 application topically 2 (two) times daily.   vitamin E 400 UNIT capsule Take 400 Units by mouth daily.         Diet and Activity recommendation: See Discharge Instructions above Consults obtained -ortho   Major procedures and Radiology Reports - PLEASE review detailed and final reports for all details, in brief -      Ct Head Wo Contrast  Result Date: 01/01/2016 CLINICAL DATA:  Status post fall. EXAM: CT HEAD WITHOUT CONTRAST CT CERVICAL SPINE WITHOUT CONTRAST TECHNIQUE: Multidetector CT imaging of the head and cervical spine was performed following the standard protocol without intravenous contrast. Multiplanar CT image reconstructions of the cervical spine were also generated. COMPARISON:  Brain MRI 08/06/2006 FINDINGS: CT HEAD FINDINGS Brain: No mass lesion, intraparenchymal hemorrhage or extra-axial collection. No evidence of acute cortical infarct. 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 Vascular: No hyperdense vessel or unexpected calcification. Skull: Normal visualized skull base, calvarium and extracranial soft tissues. Sinuses/Orbits: No sinus fluid levels or advanced mucosal thickening. No mastoid effusion. Normal orbits. CT CERVICAL SPINE FINDINGS Alignment: No static subluxation. Facets are aligned. Occipital condyles are normally positioned. Skull base and vertebrae: No acute fracture. Soft tissues and spinal canal: No prevertebral fluid or swelling. No visible canal hematoma. Disc levels: No advanced spinal canal stenosis. There is multilevel severe facet hypertrophy without obvious severe foraminal stenosis. Upper chest: No pneumothorax, pulmonary nodule or pleural effusion. Other: Normal visualized  paraspinal cervical soft tissues. IMPRESSION: 1. No acute intracranial abnormality. Old left thalamic lacunar infarct. 2. No acute fracture or static subluxation of the cervical spine. Electronically Signed   By: Ulyses Jarred M.D.   On: 01/01/2016 20:59   Ct Cervical Spine Wo Contrast  Result Date: 01/01/2016 CLINICAL DATA:  Status post fall. EXAM: CT HEAD WITHOUT CONTRAST CT CERVICAL SPINE WITHOUT CONTRAST TECHNIQUE: Multidetector CT imaging of the head and cervical spine was performed following the standard protocol without intravenous contrast. Multiplanar CT image reconstructions of the cervical spine were also generated. COMPARISON:  Brain MRI 08/06/2006 FINDINGS: CT HEAD FINDINGS Brain: No mass lesion, intraparenchymal hemorrhage or extra-axial collection. No evidence of acute cortical infarct. 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 Vascular: No hyperdense vessel or unexpected calcification. Skull: Normal visualized skull base, calvarium and extracranial soft tissues. Sinuses/Orbits: No sinus fluid levels or advanced mucosal thickening. No mastoid effusion. Normal orbits. CT CERVICAL SPINE FINDINGS Alignment: No static subluxation. Facets are aligned. Occipital condyles are normally positioned. Skull base and vertebrae: No acute fracture. Soft tissues and spinal canal: No prevertebral fluid or swelling. No visible canal hematoma. Disc levels: No advanced spinal canal stenosis. There is multilevel severe facet hypertrophy without obvious severe foraminal stenosis. Upper chest: No pneumothorax, pulmonary nodule or pleural effusion. Other: Normal visualized paraspinal cervical soft tissues. IMPRESSION: 1. No acute intracranial  abnormality. Old left thalamic lacunar infarct. 2. No acute fracture or static subluxation of the cervical spine. Electronically Signed   By: Ulyses Jarred M.D.   On: 01/01/2016 20:59   Dg Chest Portable 1 View  Result Date: 01/01/2016 CLINICAL DATA:  [place] with left proximal femur  pain. Left hip fracture. EXAM: PORTABLE CHEST 1 VIEW COMPARISON:  08/05/2006 FINDINGS: Both lungs are clear. Heart and mediastinum are within normal limits. Trachea is midline. Negative for a pneumothorax. No acute bone abnormality in the chest. IMPRESSION: No acute chest abnormality. Electronically Signed   By: Markus Daft M.D.   On: 01/01/2016 20:53   Dg Hip Port Unilat With Pelvis 1v Left  Result Date: 01/03/2016 CLINICAL DATA:  75 year old female with left hip hemiarthroplasty. EXAM: DG HIP (WITH OR WITHOUT PELVIS) 1V PORT LEFT COMPARISON:  Radiograph dated 01/01/2016 FINDINGS: There has been interval placement of a left femoral prostheses. The orthopedic hardware appears intact. There is no evidence of loosening. No acute fracture or dislocation identified. The head of the orthopedic hardware is in anatomic alignment with the acetabulum. The bones are osteopenic. The soft tissues appear unremarkable. A drainage catheter noted over the left hip. Cutaneous surgical clips noted. IMPRESSION: Postsurgical changes of left hip hemiarthroplasty. No acute fracture or dislocation. Electronically Signed   By: Anner Crete M.D.   On: 01/03/2016 02:18   Dg Hip Unilat W Or Wo Pelvis 2-3 Views Left  Result Date: 01/01/2016 CLINICAL DATA:  Status post fall EXAM: DG HIP (WITH OR WITHOUT PELVIS) 2-3V LEFT COMPARISON:  None. FINDINGS: There is a fracture of the left femoral neck with superior displacement of the femur relative to the femoral head. The femoral head remains within the acetabular cup. The remainder of the visualized pelvis is unremarkable. IMPRESSION: Acute fracture of the left femoral neck with approximately 2 cm of overriding. Electronically Signed   By: Ulyses Jarred M.D.   On: 01/01/2016 21:05    Micro Results     Recent Results (from the past 240 hour(s))  Surgical pcr screen     Status: None   Collection Time: 01/01/16 10:55 PM  Result Value Ref Range Status   MRSA, PCR NEGATIVE  NEGATIVE Final   Staphylococcus aureus NEGATIVE NEGATIVE Final    Comment:        The Xpert SA Assay (FDA approved for NASAL specimens in patients over 45 years of age), is one component of a comprehensive surveillance program.  Test performance has been validated by [facility] for patients greater than or equal to 7 year old. It is not intended to diagnose infection nor to guide or monitor treatment.        Today   Subjective:   Harlow Ohms today .Stable for going to rehabilitation today  Objective:   Blood pressure (!) 141/64, pulse 90, temperature 98.7 F (37.1 C), temperature source Oral, resp. rate 18, height 5\' 2"  (1.575 m), weight 70.8 kg (156 lb), SpO2 94 %.   Intake/Output Summary (Last 24 hours) at 01/05/16 0952 Last data filed at 01/05/16 0650  Gross per 24 hour  Intake              480 ml  Output              200 ml  Net              280 ml    Exam Awake Alert, Oriented x 3, No new F.N deficits, Normal affect [place].AT,PERRAL Supple Neck,No  JVD, No cervical lymphadenopathy appriciated.  Symmetrical Chest wall movement, Good air movement bilaterally, CTAB RRR,No Gallops,Rubs or new Murmurs, No Parasternal Heave +ve B.Sounds, Abd Soft, Non tender, No organomegaly appriciated, No rebound -guarding or rigidity. No Cyanosis, Clubbing or edema, No new Rash or bruise  Data Review   CBC w Diff: Lab Results  Component Value Date   WBC 13.2 (H) 01/04/2016   HGB 12.7 01/04/2016   HCT 36.8 01/04/2016   PLT 173 01/04/2016    CMP: Lab Results  Component Value Date   NA 137 01/04/2016   K 3.2 (L) 01/04/2016   CL 102 01/04/2016   CO2 27 01/04/2016   BUN 8 01/04/2016   CREATININE 0.59 01/04/2016  .   Total Time in preparing paper work, data evaluation and todays exam - 19 minutes  Quayshaun Hubbert M.D on 01/05/2016 at 9:52 AM    Note: This dictation was prepared with Dragon dictation along with smaller phrase technology. Any transcriptional  errors that result from this process are unintentional.

## 2016-01-05 NOTE — Progress Notes (Signed)
Physical Therapy Treatment Patient Details Name: Melissa [name] MRN: [id number] DOB: [birth year] Today's Date: 01/05/2016    History of Present Illness presented to ER status post fall in home environment (down for 2-3 hours) with L hip fracture; admitted status post L hip hemiarthroplasty, 01/02/16, WBAT.    PT Comments    Patient is a pleasant 75 y.o. Female who required verbal reminders of 1/3 precautions. Demonstrates increased ambulation distance at today's session with minimal-moderate fatigue. Seemed to improve in gait as she continued to walk, with most difficulty being with R LE initially due to reported past CVA. Patient will continue to benefit from progressive gait training as tolerated.  Follow Up Recommendations  SNF     Equipment Recommendations  Rolling walker with 5" wheels    Recommendations for Other Services       Precautions / Restrictions Precautions Precautions: Posterior Hip;Fall Precaution Booklet Issued: No Restrictions Weight Bearing Restrictions: Yes LLE Weight Bearing: Weight bearing as tolerated    Mobility  Bed Mobility Overal bed mobility: Needs Assistance Bed Mobility: Sit to Supine       Sit to supine: Min assist   General bed mobility comments: Patient required minimal assistance to scoot to EOB and maintain hip precautions.  Transfers Overall transfer level: Needs assistance Equipment used: Rolling walker (2 wheeled) Transfers: Sit to/from Stand Sit to Stand: Min assist         General transfer comment: Patient required two attempts to perform sit to stand transfer. Complained of more difficulty with R LE from previous CVA. Was able to stand upright after performing static balance weightshifting activities.  Ambulation/Gait Ambulation/Gait assistance: Min assist Ambulation Distance (Feet): 65 Feet         General Gait Details: Patient ambulates at decreased cadence, demonstrating decreased step length on L and increased  use of UEs, tending to shrug shoulders. Verbal cues utilized to improve gait.   Stairs            Wheelchair Mobility    Modified Rankin (Stroke Patients Only)       Balance Overall balance assessment: Needs assistance;History of Falls Sitting-balance support: Feet supported;Single extremity supported Sitting balance-Leahy Scale: Good     Standing balance support: Bilateral upper extremity supported Standing balance-Leahy Scale: Fair                      Cognition Arousal/Alertness: Awake/alert Behavior During Therapy: WFL for tasks assessed/performed Overall Cognitive Status: Within Functional Limits for tasks assessed                      Exercises Total Joint Exercises Ankle Circles/Pumps: AROM;20 reps Quad Sets: Strengthening;20 reps Gluteal Sets: Strengthening;20 reps Hip ABduction/ADduction: AAROM;15 reps Long Arc Quad: AAROM;15 reps    General Comments        Pertinent Vitals/Pain Pain Assessment: No/denies pain Pain Score: 0-No pain    Home Living                      Prior Function            PT Goals (current goals can now be found in the care plan section) Acute Rehab PT Goals Patient Stated Goal: To return to PLOF PT Goal Formulation: With patient Time For Goal Achievement: 01/17/16 Potential to Achieve Goals: Good Progress towards PT goals: Progressing toward goals    Frequency    BID      PT Plan Current plan  remains appropriate    Co-evaluation             End of Session Equipment Utilized During Treatment: Gait belt Activity Tolerance: Patient tolerated treatment well;Patient limited by fatigue Patient left: in chair;with call bell/phone within reach;with chair alarm set     Time: [phone] PT Time Calculation (min) (ACUTE ONLY): 29 min  Charges:  $Gait Training: 8-22 mins $Therapeutic Exercise: 8-22 mins                    G Codes:      Dorice Lamas, PT, DPT 01/05/2016, 10:31  AM

## 2016-01-08 DIAGNOSIS — G8191 Hemiplegia, unspecified affecting right dominant side: Secondary | ICD-10-CM | POA: Diagnosis not present

## 2016-01-08 DIAGNOSIS — I10 Essential (primary) hypertension: Secondary | ICD-10-CM | POA: Diagnosis not present

## 2016-01-08 DIAGNOSIS — M810 Age-related osteoporosis without current pathological fracture: Secondary | ICD-10-CM

## 2016-01-08 DIAGNOSIS — S7292XA Unspecified fracture of left femur, initial encounter for closed fracture: Secondary | ICD-10-CM | POA: Diagnosis not present

## 2016-01-08 DIAGNOSIS — E785 Hyperlipidemia, unspecified: Secondary | ICD-10-CM

## 2016-01-17 DIAGNOSIS — M7662 Achilles tendinitis, left leg: Secondary | ICD-10-CM

## 2016-02-06 DIAGNOSIS — Z8781 Personal history of (healed) traumatic fracture: Secondary | ICD-10-CM | POA: Insufficient documentation

## 2016-02-17 DIAGNOSIS — Z96649 Presence of unspecified artificial hip joint: Secondary | ICD-10-CM | POA: Insufficient documentation

## 2016-03-25 ENCOUNTER — Other Ambulatory Visit: Payer: Self-pay | Admitting: Internal Medicine

## 2016-03-25 DIAGNOSIS — Z1231 Encounter for screening mammogram for malignant neoplasm of breast: Secondary | ICD-10-CM

## 2016-04-23 ENCOUNTER — Ambulatory Visit
Admission: RE | Admit: 2016-04-23 | Discharge: 2016-04-23 | Disposition: A | Discharge status: Home / Self Care | Payer: Medicare Other | Source: Ambulatory Visit | Attending: Internal Medicine | Admitting: Internal Medicine

## 2016-04-23 DIAGNOSIS — Z1231 Encounter for screening mammogram for malignant neoplasm of breast: Secondary | ICD-10-CM | POA: Diagnosis not present

## 2017-04-07 ENCOUNTER — Other Ambulatory Visit: Payer: Self-pay | Admitting: Internal Medicine

## 2017-04-07 DIAGNOSIS — Z1231 Encounter for screening mammogram for malignant neoplasm of breast: Secondary | ICD-10-CM

## 2017-04-24 ENCOUNTER — Ambulatory Visit
Admission: RE | Admit: 2017-04-24 | Discharge: 2017-04-24 | Disposition: A | Discharge status: Home / Self Care | Payer: Medicare Other | Source: Ambulatory Visit | Attending: Internal Medicine | Admitting: Internal Medicine

## 2017-04-24 DIAGNOSIS — Z1231 Encounter for screening mammogram for malignant neoplasm of breast: Secondary | ICD-10-CM | POA: Diagnosis present

## 2018-05-19 ENCOUNTER — Other Ambulatory Visit: Payer: Self-pay | Admitting: Internal Medicine

## 2018-05-19 DIAGNOSIS — Z1231 Encounter for screening mammogram for malignant neoplasm of breast: Secondary | ICD-10-CM

## 2018-05-27 ENCOUNTER — Ambulatory Visit
Admission: RE | Admit: 2018-05-27 | Discharge: 2018-05-27 | Disposition: A | Discharge status: Home / Self Care | Payer: Medicare Other | Source: Ambulatory Visit | Attending: Internal Medicine | Admitting: Internal Medicine

## 2018-05-27 ENCOUNTER — Other Ambulatory Visit: Payer: Self-pay

## 2018-05-27 DIAGNOSIS — Z1231 Encounter for screening mammogram for malignant neoplasm of breast: Secondary | ICD-10-CM | POA: Diagnosis not present

## 2018-10-20 ENCOUNTER — Other Ambulatory Visit: Payer: Self-pay | Admitting: Physician Assistant

## 2018-10-20 ENCOUNTER — Ambulatory Visit
Admission: RE | Admit: 2018-10-20 | Discharge: 2018-10-20 | Disposition: A | Discharge status: Home / Self Care | Payer: Medicare Other | Source: Ambulatory Visit | Attending: Physician Assistant | Admitting: Physician Assistant

## 2018-10-20 ENCOUNTER — Other Ambulatory Visit: Payer: Self-pay

## 2018-10-20 DIAGNOSIS — R140 Abdominal distension (gaseous): Secondary | ICD-10-CM | POA: Diagnosis present

## 2018-10-20 DIAGNOSIS — K769 Liver disease, unspecified: Secondary | ICD-10-CM

## 2018-10-20 DIAGNOSIS — R17 Unspecified jaundice: Secondary | ICD-10-CM | POA: Insufficient documentation

## 2018-10-20 HISTORY — DX: Essential (primary) hypertension: I10

## 2018-10-20 MED ORDER — IOHEXOL 300 MG/ML  SOLN
100.0000 mL | Freq: Once | INTRAMUSCULAR | Status: AC | PRN
  Administered 2018-10-20: 18:00:00 100 mL via INTRAVENOUS

## 2018-10-21 ENCOUNTER — Other Ambulatory Visit: Payer: Self-pay | Admitting: Physician Assistant

## 2018-10-21 DIAGNOSIS — R17 Unspecified jaundice: Secondary | ICD-10-CM

## 2018-10-21 DIAGNOSIS — K831 Obstruction of bile duct: Secondary | ICD-10-CM

## 2018-10-21 DIAGNOSIS — K769 Liver disease, unspecified: Secondary | ICD-10-CM

## 2018-10-21 DIAGNOSIS — R140 Abdominal distension (gaseous): Secondary | ICD-10-CM

## 2018-10-29 ENCOUNTER — Emergency Department: Payer: Medicare Other

## 2018-10-29 ENCOUNTER — Inpatient Hospital Stay: Payer: Medicare Other | Admitting: Anesthesiology

## 2018-10-29 ENCOUNTER — Inpatient Hospital Stay: Payer: Medicare Other | Attending: Oncology | Admitting: Oncology

## 2018-10-29 ENCOUNTER — Encounter
Admission: EM | Disposition: A | Discharge status: Home / Self Care | Payer: Self-pay | Source: Home / Self Care | Attending: Internal Medicine

## 2018-10-29 ENCOUNTER — Inpatient Hospital Stay: Payer: Medicare Other

## 2018-10-29 ENCOUNTER — Other Ambulatory Visit: Payer: Self-pay

## 2018-10-29 ENCOUNTER — Encounter: Payer: Self-pay | Admitting: Oncology

## 2018-10-29 ENCOUNTER — Inpatient Hospital Stay
Admission: EM | Admit: 2018-10-29 | Discharge: 2018-10-31 | DRG: 436 | Disposition: A | Discharge status: Home / Self Care | Payer: Medicare Other | Attending: Internal Medicine | Admitting: Internal Medicine

## 2018-10-29 VITALS — BP 112/61 | HR 84 | Temp 98.2°F | Ht 61.25 in | Wt 150.2 lb

## 2018-10-29 DIAGNOSIS — D490 Neoplasm of unspecified behavior of digestive system: Secondary | ICD-10-CM | POA: Diagnosis not present

## 2018-10-29 DIAGNOSIS — C801 Malignant (primary) neoplasm, unspecified: Secondary | ICD-10-CM

## 2018-10-29 DIAGNOSIS — R630 Anorexia: Secondary | ICD-10-CM | POA: Insufficient documentation

## 2018-10-29 DIAGNOSIS — I10 Essential (primary) hypertension: Secondary | ICD-10-CM | POA: Diagnosis present

## 2018-10-29 DIAGNOSIS — M8580 Other specified disorders of bone density and structure, unspecified site: Secondary | ICD-10-CM | POA: Diagnosis present

## 2018-10-29 DIAGNOSIS — Z8673 Personal history of transient ischemic attack (TIA), and cerebral infarction without residual deficits: Secondary | ICD-10-CM

## 2018-10-29 DIAGNOSIS — K8011 Calculus of gallbladder with chronic cholecystitis with obstruction: Secondary | ICD-10-CM | POA: Diagnosis present

## 2018-10-29 DIAGNOSIS — Z20828 Contact with and (suspected) exposure to other viral communicable diseases: Secondary | ICD-10-CM | POA: Diagnosis present

## 2018-10-29 DIAGNOSIS — Z96642 Presence of left artificial hip joint: Secondary | ICD-10-CM | POA: Diagnosis present

## 2018-10-29 DIAGNOSIS — R160 Hepatomegaly, not elsewhere classified: Secondary | ICD-10-CM | POA: Diagnosis not present

## 2018-10-29 DIAGNOSIS — Z66 Do not resuscitate: Secondary | ICD-10-CM | POA: Insufficient documentation

## 2018-10-29 DIAGNOSIS — C259 Malignant neoplasm of pancreas, unspecified: Secondary | ICD-10-CM | POA: Diagnosis present

## 2018-10-29 DIAGNOSIS — E876 Hypokalemia: Secondary | ICD-10-CM | POA: Diagnosis present

## 2018-10-29 DIAGNOSIS — K831 Obstruction of bile duct: Secondary | ICD-10-CM | POA: Insufficient documentation

## 2018-10-29 DIAGNOSIS — K8689 Other specified diseases of pancreas: Secondary | ICD-10-CM | POA: Diagnosis not present

## 2018-10-29 DIAGNOSIS — R634 Abnormal weight loss: Secondary | ICD-10-CM

## 2018-10-29 DIAGNOSIS — R197 Diarrhea, unspecified: Secondary | ICD-10-CM | POA: Insufficient documentation

## 2018-10-29 DIAGNOSIS — R109 Unspecified abdominal pain: Secondary | ICD-10-CM | POA: Diagnosis present

## 2018-10-29 HISTORY — PX: ERCP: SHX5425

## 2018-10-29 LAB — CBC WITH DIFFERENTIAL/PLATELET
Abs Immature Granulocytes: 0.05 10*3/uL (ref 0.00–0.07)
Basophils Absolute: 0.1 10*3/uL (ref 0.0–0.1)
Basophils Relative: 1 %
Eosinophils Absolute: 0.2 10*3/uL (ref 0.0–0.5)
Eosinophils Relative: 2 %
HCT: 35.7 % — ABNORMAL LOW (ref 36.0–46.0)
Hemoglobin: 12 g/dL (ref 12.0–15.0)
Immature Granulocytes: 1 %
Lymphocytes Relative: 8 %
Lymphs Abs: 0.7 10*3/uL (ref 0.7–4.0)
MCH: 32.2 pg (ref 26.0–34.0)
MCHC: 33.6 g/dL (ref 30.0–36.0)
MCV: 95.7 fL (ref 80.0–100.0)
Monocytes Absolute: 0.8 10*3/uL (ref 0.1–1.0)
Monocytes Relative: 9 %
Neutro Abs: 6.9 10*3/uL (ref 1.7–7.7)
Neutrophils Relative %: 79 %
Platelets: 322 10*3/uL (ref 150–400)
RBC: 3.73 MIL/uL — ABNORMAL LOW (ref 3.87–5.11)
RDW: 17.9 % — ABNORMAL HIGH (ref 11.5–15.5)
WBC: 8.7 10*3/uL (ref 4.0–10.5)
nRBC: 0 % (ref 0.0–0.2)

## 2018-10-29 LAB — COMPREHENSIVE METABOLIC PANEL
ALT: 79 U/L — ABNORMAL HIGH (ref 0–44)
AST: 119 U/L — ABNORMAL HIGH (ref 15–41)
Albumin: 2.6 g/dL — ABNORMAL LOW (ref 3.5–5.0)
Alkaline Phosphatase: 399 U/L — ABNORMAL HIGH (ref 38–126)
Anion gap: 10 (ref 5–15)
BUN: 13 mg/dL (ref 8–23)
CO2: 21 mmol/L — ABNORMAL LOW (ref 22–32)
Calcium: 9 mg/dL (ref 8.9–10.3)
Chloride: 104 mmol/L (ref 98–111)
Creatinine, Ser: 0.32 mg/dL — ABNORMAL LOW (ref 0.44–1.00)
GFR calc Af Amer: >60 mL/min (ref >60)
GFR calc non Af Amer: >60 mL/min (ref >60)
Glucose, Bld: 110 mg/dL — ABNORMAL HIGH (ref 70–99)
Potassium: 3 mmol/L — ABNORMAL LOW (ref 3.5–5.1)
Sodium: 135 mmol/L (ref 135–145)
Total Bilirubin: 18.2 mg/dL (ref 0.3–1.2)
Total Protein: 7 g/dL (ref 6.5–8.1)

## 2018-10-29 LAB — PROTIME-INR
INR: 1.1 (ref 0.8–1.2)
Prothrombin Time: 14.4 seconds (ref 11.4–15.2)

## 2018-10-29 LAB — LIPASE, BLOOD: Lipase: 74 U/L — ABNORMAL HIGH (ref 11–51)

## 2018-10-29 LAB — BILIRUBIN, DIRECT: Bilirubin, Direct: 10.2 mg/dL — ABNORMAL HIGH (ref 0.0–0.2)

## 2018-10-29 LAB — SARS CORONAVIRUS 2 BY RT PCR (HOSPITAL ORDER, PERFORMED IN ~~LOC~~ HOSPITAL LAB): SARS Coronavirus 2: NEGATIVE

## 2018-10-29 SURGERY — ERCP, WITH INTERVENTION IF INDICATED
Anesthesia: General

## 2018-10-29 MED ORDER — LIDOCAINE HCL (CARDIAC) PF 100 MG/5ML IV SOSY
PREFILLED_SYRINGE | INTRAVENOUS | Status: DC | PRN
  Administered 2018-10-29: 60 mg via INTRAVENOUS

## 2018-10-29 MED ORDER — PROPOFOL 10 MG/ML IV BOLUS
INTRAVENOUS | Status: DC | PRN
  Administered 2018-10-29: 30 mg via INTRAVENOUS
  Administered 2018-10-29: 40 mg via INTRAVENOUS

## 2018-10-29 MED ORDER — PROPOFOL 10 MG/ML IV BOLUS
INTRAVENOUS | Status: AC
  Filled 2018-10-29: qty 20

## 2018-10-29 MED ORDER — POTASSIUM CHLORIDE CRYS ER 20 MEQ PO TBCR
40.0000 meq | EXTENDED_RELEASE_TABLET | Freq: Once | ORAL | Status: AC
  Administered 2018-10-29: 40 meq via ORAL
  Filled 2018-10-29: qty 2

## 2018-10-29 MED ORDER — LIDOCAINE HCL (PF) 2 % IJ SOLN
INTRAMUSCULAR | Status: AC
  Filled 2018-10-29: qty 10

## 2018-10-29 MED ORDER — INDOMETHACIN 50 MG RE SUPP
100.0000 mg | Freq: Once | RECTAL | Status: AC
  Administered 2018-10-29: 18:00:00 100 mg via RECTAL

## 2018-10-29 MED ORDER — SODIUM CHLORIDE 0.9 % IV SOLN
INTRAVENOUS | Status: DC
  Administered 2018-10-29: 20:00:00 via INTRAVENOUS
  Administered 2018-10-29: 1000 mL via INTRAVENOUS
  Administered 2018-10-30 – 2018-10-31 (×2): via INTRAVENOUS

## 2018-10-29 MED ORDER — PHENYLEPHRINE HCL (PRESSORS) 10 MG/ML IV SOLN
INTRAVENOUS | Status: DC | PRN
  Administered 2018-10-29: 100 ug via INTRAVENOUS

## 2018-10-29 MED ORDER — AMLODIPINE BESYLATE 10 MG PO TABS
10.0000 mg | ORAL_TABLET | Freq: Every day | ORAL | Status: DC
  Administered 2018-10-30 – 2018-10-31 (×2): 10 mg via ORAL
  Filled 2018-10-29: qty 2
  Filled 2018-10-29 (×2): qty 1

## 2018-10-29 MED ORDER — SODIUM CHLORIDE 0.9 % IV SOLN: INTRAVENOUS | Status: DC

## 2018-10-29 MED ORDER — INDOMETHACIN 50 MG RE SUPP
RECTAL | Status: AC
  Filled 2018-10-29: qty 2

## 2018-10-29 MED ORDER — PROPOFOL 500 MG/50ML IV EMUL
INTRAVENOUS | Status: DC | PRN
  Administered 2018-10-29: 140 ug/kg/min via INTRAVENOUS

## 2018-10-29 NOTE — H&P (Signed)
[place] [facility] 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  Patient reports onset of symptoms early last month, she states she was busy with her 32 year old mother and sick husband and had not noticed any skin discoloration until her neighbor pointed out to her.  She presented to her PCP on 10/20/2018 for further evaluation and was found to have elevated bilirubin of 15.  Patient states she has been having mild fatigue and increased itching otherwise denies nausea or vomiting, fevers or chills, diarrhea, or abdominal pain.  She had a CT abdomen which revealed moderate to marked intra-and extrahepatic biliary dilation, enlargement of the pancreatic head, calcified gallstone with slight gallbladder wall edema, and multiple enlarged hepatic, peripancreatic, and retroperitoneal nodes.  Given this finding patient was referred to oncologist for further evaluation.  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  Due to significant obstructive jaundice, patient was referred to the ED for admission with possible urgent relief of obstruction.  On arrival to the ED, she was afebrile with blood pressure 112/61 mm Hg and pulse rate 84 beats/min. There were no  focal neurological deficits; she was alert and oriented x4.  Initial labs revealed unremarkable CBC, potassium 3.0, AST 119, ALT 79, alkaline phosphate 399, total bilirubin 18.2, direct bilirubin 10.2, lipase 74.  Given CT finding as above patient will be admitted under hospitalist service for further management.  PAST MEDICAL HISTORY:   Past Medical History:  Diagnosis Date   Glaucoma    Hypertension    Osteopenia    Stroke [facility])     PAST SURGICAL HISTORY:   Past Surgical History:  Procedure Laterality Date   BREAST BIOPSY Right    core- stereo- neg   HIP ARTHROPLASTY Left 01/02/2016   Procedure: ARTHROPLASTY BIPOLAR HIP (HEMIARTHROPLASTY);  Surgeon: Dereck Leep, MD;  Location: [facility] ORS;  Service: Orthopedics;  Laterality: Left;   TONSILLECTOMY      SOCIAL HISTORY:   Social History   Tobacco Use   Smoking status: Never Smoker   Smokeless tobacco: Never Used  Substance Use Topics   Alcohol use: No    FAMILY HISTORY:   Family History  Problem Relation Age of Onset   Kidney disease Father    Breast cancer Neg Hx     DRUG ALLERGIES:  No Known Allergies  REVIEW OF SYSTEMS:   Review of Systems  Constitutional: Negative for chills, fever, malaise/fatigue and weight loss.       Weight changes  HENT: Negative for congestion, hearing loss and sore throat.   Eyes: Negative for blurred vision and double vision.  Respiratory: Negative for cough, shortness of breath and wheezing.   Cardiovascular: Negative for chest pain, palpitations, orthopnea and leg swelling.  Gastrointestinal: Positive for diarrhea. Negative for abdominal pain, nausea and vomiting.  Yellow skin  Genitourinary: Negative for dysuria and urgency.  Musculoskeletal: Negative for myalgias.  Skin: Positive for itching. Negative for rash.  Neurological: Negative for dizziness, sensory change, speech change, focal weakness and headaches.  Endo/Heme/Allergies: Bruises/bleeds easily.    Psychiatric/Behavioral: Negative for depression.   MEDICATIONS AT HOME:   Prior to Admission medications   Medication Sig Start Date End Date Taking? Authorizing Provider  acetaminophen (TYLENOL) 500 MG tablet Take 500 mg by mouth every 6 (six) hours as needed.   Yes [provider]  amLODipine (NORVASC) 10 MG tablet Take 10 mg by mouth daily.   Yes [provider]  potassium chloride (K-DUR) 10 MEQ tablet Take 10 mEq by mouth 2 (two) times daily. 10/20/18 10/20/19 Yes [provider]      VITAL SIGNS:  Blood pressure (!) 111/57, pulse 86, temperature 98.3 F (36.8 C), temperature source Oral, resp. rate 18, height 5\' 1"  (1.549 m), weight 68 kg, SpO2 98 %.  PHYSICAL EXAMINATION:   Physical Exam  GENERAL:  78 y.o.-year-old patient lying in the bed with no acute distress.  EYES: Pupils equal, round, reactive to light and accommodation. scleral icterus. Extraocular muscles intact.  HEENT: Head atraumatic, normocephalic. Oropharynx and nasopharynx clear.  NECK:  Supple, no jugular venous distention. No thyroid enlargement, no tenderness.  LUNGS: Normal breath sounds bilaterally, no wheezing, rales,rhonchi or crepitation. No use of accessory muscles of respiration.  CARDIOVASCULAR: S1, S2 normal. No murmurs, rubs, or gallops.  ABDOMEN: Soft, tender to palpation, nondistended. Bowel sounds present. No organomegaly or mass.  EXTREMITIES: No pedal edema, cyanosis, or clubbing. No rash or lesions. + pedal pulses MUSCULOSKELETAL: Normal bulk, and power was 5+ grip and elbow, knee, and ankle flexion and extension bilaterally.  NEUROLOGIC: Alert and oriented x 3. CN 2-12 intact. Sensation to light touch and cold stimuli intact bilaterally. Finger to nose nl. Babinski is downgoing. DTR's (biceps, patellar, and achilles) 2+ and symmetric throughout. Gait not tested due to safety concern. PSYCHIATRIC: The patient is alert and oriented x 3.  SKIN: Easy bruising,  Jaundiced  DATA REVIEWED:  LABORATORY PANEL:   CBC Recent Labs  Lab 10/29/18 1318  WBC 8.7  HGB 12.0  HCT 35.7*  PLT 322   ------------------------------------------------------------------------------------------------------------------  Chemistries  Recent Labs  Lab 10/29/18 1318  NA 135  K 3.0*  CL 104  CO2 21*  GLUCOSE 110*  BUN 13  CREATININE 0.32*  CALCIUM 9.0  AST 119*  ALT 79*  ALKPHOS 399*  BILITOT 18.2*   ------------------------------------------------------------------------------------------------------------------  Cardiac Enzymes No results for input(s): TROPONINI in the last 168 hours. ------------------------------------------------------------------------------------------------------------------  RADIOLOGY:  US Abdomen Limited Ruq  Result Date: 10/29/2018 CLINICAL DATA:  Worsening jaundice. EXAM: ULTRASOUND ABDOMEN LIMITED RIGHT UPPER QUADRANT COMPARISON:  CT scan of October 20, 2018. FINDINGS: Gallbladder: Mild gallbladder distention is noted. Cholelithiasis is noted with mild gallbladder wall thickening and pericholecystic fluid. Some degree of sludge is present as well. No sonographic Murphy's sign is noted. Common bile duct: Diameter: Measures 17 mm proximally and 13 mm distally, consistent with distal common bile duct obstruction. Liver: No focal lesion identified. Within normal limits in parenchymal echogenicity. Intrahepatic biliary dilatation is noted. Portal vein is patent on color Doppler imaging with normal direction of blood flow towards the liver. Other: None. IMPRESSION: Cholelithiasis is noted with mild gallbladder wall thickening and pericholecystic fluid, and some degree of sludge within gallbladder lumen. Mild gallbladder distention is noted. Cholecystitis cannot be excluded and clinical correlation is recommended. Significant  intrahepatic and extrahepatic biliary dilatation is noted concerning for distal common bile duct obstruction,  potentially due to pancreatic neoplasm as described on prior CT scan. Electronically Signed   By: Marijo Conception M.D.   On: 10/29/2018 12:55    EKG:  EKG: there are no previous tracings available for comparison.  IMPRESSION AND PLAN:   78 y.o. 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   1. Hyperbilirubinemia  -presenting with obstructive jaundice,itching likely due to malignant neoplasm of the pancrease - Elevated bilirubin 18.2, direct bili 10.2 - Prior CT abdomen pelvis with suspected biliary obstruction of the distal common bile duct secondary to malignancy of the pancreas with possible mets. - Ultrasound of abdomen shows cholelithiasis and possible cholecystitis - Admit to MedSurg unit - IVFs - GI consult, discussed with Dr. Allen Norris will be going for ERCP today  2. 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 Tasia Catchings - Oncology consult placed to Dr. Tasia Catchings  3. Hypokalemia-replete now and recheck in a.m.+ mag  4. Hx of CVA - Aspirin previously stopped  5. HTN  + Goal BP <130/80 -Continue amlodipine  6. DVT prophylaxis - Hold anti-coagulation for  pending procedure    All the records are reviewed and case discussed with ED provider. Management plans discussed with the patient, family and they are in agreement.  CODE STATUS: FULL  TOTAL TIME TAKING CARE OF THIS PATIENT: 50 minutes.    on 10/29/2018 at 4:42 PM  Rufina Falco, DNP, FNP-BC [facility] Nurse Practitioner Between 7am to 6pm - Pager [phone]  After 6pm go to [email or web address] - password [email or web address] [facility]  Office  [phone]  CC: Primary care physician; Adin Hector, MD

## 2018-10-29 NOTE — Transfer of Care (Signed)
Immediate Anesthesia Transfer of Care Note  Patient: Melissa [name]  Procedure(s) Performed: ENDOSCOPIC RETROGRADE CHOLANGIOPANCREATOGRAPHY (ERCP) (N/A )  Patient Location: PACU  Anesthesia Type:General  Level of Consciousness: awake, alert  and oriented  Airway & Oxygen Therapy: Patient Spontanous Breathing and Patient connected to nasal cannula oxygen  Post-op Assessment: Report given to RN and Post -op Vital signs reviewed and stable  Post vital signs: Reviewed and stable  Last Vitals:  Vitals Value Taken Time  BP 142/66 10/29/18 1827  Temp 36.8 C 10/29/18 1825  Pulse 72 10/29/18 1831  Resp 15 10/29/18 1831  SpO2 100 % 10/29/18 1831  Vitals shown include unvalidated device data.  Last Pain:  Vitals:   10/29/18 1825  TempSrc: Tympanic  PainSc:          Complications: No apparent anesthesia complications

## 2018-10-29 NOTE — Op Note (Signed)
[facility] - Inpatient Gastroenterology Patient Name: Melissa [name] Procedure Date: 10/29/2018 5:47 PM MRN: [id number] Account #: [id number] Date of Birth: June 22, 1940 Admit Type: Outpatient Age: 78 Room: [facility] ENDO ROOM 4 Gender: Female Note Status: Finalized Procedure:            ERCP Indications:          Tumor of the head of pancreas Providers:            [name] MD, MD Referring MD:         Ramonita Lab, MD (Referring MD) Medicines:            Propofol per Anesthesia Complications:        No immediate complications. Procedure:            Pre-Anesthesia Assessment:                       - Prior to the procedure, a History and Physical was                        performed, and patient medications and allergies were                        reviewed. The patient's tolerance of previous                        anesthesia was also reviewed. The risks and benefits of                        the procedure and the sedation options and risks were                        discussed with the patient. All questions were                        answered, and informed consent was obtained. Prior                        Anticoagulants: The patient has taken no previous                        anticoagulant or antiplatelet agents. ASA Grade                        Assessment: II - A patient with mild systemic disease.                        After reviewing the risks and benefits, the patient was                        deemed in satisfactory condition to undergo the                        procedure.                       After obtaining informed consent, the scope was passed                        under direct vision. Throughout the procedure, the  patient's blood pressure, pulse, and oxygen saturations                        were monitored continuously. The Duodenoscope was                        introduced through the mouth, and used to inject   contrast into and used to inject contrast into the bile                        duct. The ERCP was accomplished without difficulty. The                        patient tolerated the procedure well. Findings:      The scout film was normal. The major papilla was bulging. The bile duct       was deeply cannulated with the short-nosed traction sphincterotome.       Contrast was injected. I personally interpreted the bile duct images.       There was brisk flow of contrast through the ducts. Image quality was       excellent. Contrast extended to the entire biliary tree. The lower third       of the main bile duct contained a single segmental stenosis. The upper       third of the main bile duct was diffusely dilated. A wire was passed       into the biliary tree. A 4 mm biliary sphincterotomy was made with a       traction (standard) sphincterotome using ERBE electrocautery. There was       no post-sphincterotomy bleeding. One 10 Fr by 5 cm plastic stent with a       single external flap and a single internal flap was placed 4 cm into the       common bile duct. Bile flowed through the stent. The stent was in good       position. The Ampulla was biopsied with a [Device] [Purpose]. Impression:           - The major papilla appeared to be bulging.                       - A single segmental biliary stricture was found in the                        lower third of the main bile duct.                       - The upper third of the main bile duct was dilated.                       - A biliary sphincterotomy was performed.                       - One plastic stent was placed into the common bile                        duct. Recommendation:       - Return patient to hospital ward for ongoing care.                       -  Clear liquid diet.                       - Await pathology results.                       - Repeat ERCP in 3 months to exchange stent.                       - Watch for pancreatitis,  bleeding, perforation, and                        cholangitis. Procedure Code(s):    --- Professional ---                       [phone], Endoscopic retrograde cholangiopancreatography                        (ERCP); with placement of endoscopic stent into biliary                        or pancreatic duct, including pre- and post-dilation                        and guide wire passage, when performed, including                        sphincterotomy, when performed, each stent                       [id number], Endoscopic catheterization of the biliary ductal                        system, radiological supervision and interpretation Diagnosis Code(s):    --- Professional ---                       D49.0, Neoplasm of unspecified behavior of digestive                        system                       K83.1, Obstruction of bile duct CPT copyright 2019 [employer]. All rights reserved. The codes documented in this report are preliminary and upon coder review may  be revised to meet current compliance requirements. [name] MD, MD 10/29/2018 6:25:24 PM This report has been signed electronically. Number of Addenda: 0 Note Initiated On: 10/29/2018 5:47 PM Estimated Blood Loss: Estimated blood loss: none.      [facility]

## 2018-10-29 NOTE — Consult Note (Signed)
Melissa Lame, MD [facility]  [address] Melissa [name], [place] [address] Phone: [phone] Fax : [phone]  Consultation  Referring Provider:     Dr. Jimmye Norman Primary Care Physician:  Adin Hector, MD Primary Gastroenterologist:  Dr. Gustavo Lah         Reason for Consultation:     Obstructive jaundice  Date of Admission:  10/29/2018 Date of Consultation:  10/29/2018         HPI:   Melissa [name] is a 78 y.o. female who was seen at her primary care's office for obstructive jaundice back on August 8.  The jaundice was painless and she was noted to have a abnormal bilirubin of 15.  The patient was sent for a CT scan that showed:  IMPRESSION: 1. Moderate to marked intra and extrahepatic biliary dilatation with mild diffuse dilatation of the pancreatic duct. Diffuse enlargement and indistinct appearance of the pancreatic head, constellation of findings is suspect for biliary obstruction secondary to malignant stricture/possible pancreatic head mass. Additional finding of small cystic lesions within the distal body of the pancreas which could bemore thoroughly evaluated with MRI 2. Multiple calcified gallstones. Gallbladder slightly enlarged and there may be slight gallbladder wall edema or thickening 3. Multiple enlarged porta hepatis, peripancreatic, and retroperitoneal nodes, raising concern for metastatic disease.  The patient was then set up to see oncology when the CA 19-9 was found to be 1500.  There was talk about a GI referral for possible ERCP but this was never done. 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  The patient went to the emergency room and a consult was called.  Past Medical History:  Diagnosis Date   Glaucoma    Hypertension    Osteopenia    Stroke [facility])     Past Surgical  History:  Procedure Laterality Date   BREAST BIOPSY Right    core- stereo- neg   HIP ARTHROPLASTY Left 01/02/2016   Procedure: ARTHROPLASTY BIPOLAR HIP (HEMIARTHROPLASTY);  Surgeon: Dereck Leep, MD;  Location: [facility] ORS;  Service: Orthopedics;  Laterality: Left;   TONSILLECTOMY      Prior to Admission medications   Medication Sig Start Date End Date Taking? Authorizing Provider  acetaminophen (TYLENOL) 500 MG tablet Take 500 mg by mouth every 6 (six) hours as needed.   Yes [provider]  amLODipine (NORVASC) 10 MG tablet Take 10 mg by mouth daily.   Yes [provider]  potassium chloride (K-DUR) 10 MEQ tablet Take 10 mEq by mouth 2 (two) times daily. 10/20/18 10/20/19 Yes [provider]    Family History  Problem Relation Age of Onset   Kidney disease Father    Breast cancer Neg Hx      Social History   Tobacco Use   Smoking status: Never Smoker   Smokeless tobacco: Never Used  Substance Use Topics   Alcohol use: No   Drug use: Never    Allergies as of 10/29/2018   (No Known Allergies)    Review of Systems:    All systems reviewed and negative except where noted in HPI.   Physical Exam:  Vital signs in last 24 hours: Temp:  [97 F (36.1 C)-98.3 F (36.8 C)] 97.6 F (36.4 C) (08/14 1930) Pulse Rate:  [66-93] 72 (08/14 1930)  Resp:  [16-30] 19 (08/14 1930) BP: (107-156)/(56-108) 156/80 (08/14 1930) SpO2:  [97 %-100 %] 100 % (08/14 1930) Weight:  [68 kg-68.2 kg] 68 kg (08/14 1733)   General:   Pleasant, cooperative in NAD Head:  Normocephalic and atraumatic. Eyes:   Positive icterus.   Conjunctiva yellow. PERRLA. Ears:  Normal auditory acuity. Neck:  Supple; no masses or thyroidomegaly Lungs: Respirations even and unlabored. Lungs clear to auscultation bilaterally.   No wheezes, crackles, or rhonchi.  Heart:  Regular rate and rhythm;  Without murmur, clicks, rubs or gallops Abdomen:  Soft, nondistended, nontender. Normal  bowel sounds. No appreciable masses or hepatomegaly.  No rebound or guarding.  Rectal:  Not performed. Msk:  Symmetrical without gross deformities.    Extremities:  Without edema, cyanosis or clubbing. Neurologic:  Alert and oriented x3;  grossly normal neurologically. Skin:  Intact without significant lesions or rashes. Cervical Nodes:  No significant cervical adenopathy. Psych:  Alert and cooperative. Normal affect.  LAB RESULTS: Recent Labs    10/29/18 1318  WBC 8.7  HGB 12.0  HCT 35.7*  PLT 322   BMET Recent Labs    10/29/18 1318  NA 135  K 3.0*  CL 104  CO2 21*  GLUCOSE 110*  BUN 13  CREATININE 0.32*  CALCIUM 9.0   LFT Recent Labs    10/29/18 1318  PROT 7.0  ALBUMIN 2.6*  AST 119*  ALT 79*  ALKPHOS 399*  BILITOT 18.2*  BILIDIR 10.2*   PT/INR Recent Labs    10/29/18 1318  LABPROT 14.4  INR 1.1    STUDIES: Dg C-arm 1-60 Min-no Report  Result Date: 10/29/2018 Fluoroscopy was utilized by the requesting physician.  No radiographic interpretation.   US Abdomen Limited Ruq  Result Date: 10/29/2018 CLINICAL DATA:  Worsening jaundice. EXAM: ULTRASOUND ABDOMEN LIMITED RIGHT UPPER QUADRANT COMPARISON:  CT scan of October 20, 2018. FINDINGS: Gallbladder: Mild gallbladder distention is noted. Cholelithiasis is noted with mild gallbladder wall thickening and pericholecystic fluid. Some degree of sludge is present as well. No sonographic Murphy's sign is noted. Common bile duct: Diameter: Measures 17 mm proximally and 13 mm distally, consistent with distal common bile duct obstruction. Liver: No focal lesion identified. Within normal limits in parenchymal echogenicity. Intrahepatic biliary dilatation is noted. Portal vein is patent on color Doppler imaging with normal direction of blood flow towards the liver. Other: None. IMPRESSION: Cholelithiasis is noted with mild gallbladder wall thickening and pericholecystic fluid, and some degree of sludge within gallbladder  lumen. Mild gallbladder distention is noted. Cholecystitis cannot be excluded and clinical correlation is recommended. Significant intrahepatic and extrahepatic biliary dilatation is noted concerning for distal common bile duct obstruction, potentially due to pancreatic neoplasm as described on prior CT scan. Electronically Signed   By: Marijo Conception M.D.   On: 10/29/2018 12:55      Impression / Plan:   Assessment: Active Problems:   Obstructive jaundice due to malignant neoplasm [facility])   Pancreatic tumor   Obstructive jaundice   Melissa [name] is a 78 y.o. y/o female with obstructive jaundice and likely malignant pancreatic cancer.  Was found to have to have jaundice back in the beginning of the month but only got to see oncology today and was sent to the ER.  The ER doc had asked me for my opinion on this patient whereupon I suggested the patient undergo a urgent ERCP with drainage and possible admission to the hospital.  Plan:  The patient will  be set up for urgent ERCP today.  The patient has been n.p.o. since yesterday.  The patient has been told the risks and benefits of an ERCP including pancreatitis infection bleeding and death.  The patient states she understands the risks and benefits and agrees to going through with the procedure.  Thank you for involving me in the care of this patient.      LOS: 0 days   Melissa Lame, MD  10/29/2018, 7:32 PM    Note: This dictation was prepared with Dragon dictation along with smaller phrase technology. Any transcriptional errors that result from this process are unintentional.

## 2018-10-29 NOTE — ED Notes (Signed)
Pt being transported to Endo.

## 2018-10-29 NOTE — Anesthesia Postprocedure Evaluation (Signed)
Anesthesia Post Note  Patient: Melissa [name]  Procedure(s) Performed: ENDOSCOPIC RETROGRADE CHOLANGIOPANCREATOGRAPHY (ERCP) (N/A )  Patient location during evaluation: Endoscopy Anesthesia Type: General Level of consciousness: awake and alert Pain management: pain level controlled Vital Signs Assessment: post-procedure vital signs reviewed and stable Respiratory status: spontaneous breathing, nonlabored ventilation, respiratory function stable and patient connected to nasal cannula oxygen Cardiovascular status: blood pressure returned to baseline and stable Postop Assessment: no apparent nausea or vomiting Anesthetic complications: no     Last Vitals:  Vitals:   10/29/18 1845 10/29/18 1930  BP: (!) 134/108 (!) 156/80  Pulse: 66 72  Resp: (!) 23 19  Temp:  36.4 C  SpO2: 98% 100%    Last Pain:  Vitals:   10/29/18 1950  TempSrc:   PainSc: 3                  [name] S

## 2018-10-29 NOTE — Anesthesia Post-op Follow-up Note (Signed)
Anesthesia QCDR form completed.        

## 2018-10-29 NOTE — ED Provider Notes (Signed)
Plan from Dr. Allen Norris is to take the patient for biliary stent and likely discharge afterwards.    Earleen Newport, MD 10/29/18 1524

## 2018-10-29 NOTE — Brief Op Note (Addendum)
10x5 biliary stent inserted into bile duct during ERCP. Drains well.

## 2018-10-29 NOTE — ED Provider Notes (Addendum)
[facility] Emergency Department Provider Note       Time seen: ----------------------------------------- 10:59 AM on 10/29/2018 -----------------------------------------   I have reviewed the triage vital signs and the nursing notes.  HISTORY   Chief Complaint Abnormal Lab    HPI Melissa [name] is a 78 y.o. female with a history of glaucoma, hypertension, osteopenia, stroke who presents to the ED for worsening jaundice.  Patient arrives to the ER from the cancer center.  She reportedly has had elevated bilirubin at her primary care doctor for the last week.  She followed up with a cancer center today.  She reports some mild generalized abdominal soreness.  Bilirubin was 15.  Past Medical History:  Diagnosis Date  . Glaucoma   . Hypertension   . Osteopenia   . Stroke [facility])     Patient Active Problem List   Diagnosis Date Noted  . Closed left hip fracture ([place]) 01/01/2016    Past Surgical History:  Procedure Laterality Date  . BREAST BIOPSY Right    core- stereo- neg  . HIP ARTHROPLASTY Left 01/02/2016   Procedure: ARTHROPLASTY BIPOLAR HIP (HEMIARTHROPLASTY);  Surgeon: Dereck Leep, MD;  Location: [facility] ORS;  Service: Orthopedics;  Laterality: Left;  . TONSILLECTOMY      Allergies Patient has no known allergies.  Social History Social History   Tobacco Use  . Smoking status: Never Smoker  . Smokeless tobacco: Never Used  Substance Use Topics  . Alcohol use: No  . Drug use: Never   Review of Systems Constitutional: Negative for fever. Cardiovascular: Negative for chest pain. Respiratory: Negative for shortness of breath. Gastrointestinal: Positive for abdominal soreness Musculoskeletal: Negative for back pain. Skin: Positive for jaundice Neurological: Negative for headaches, focal weakness or numbness.  All systems negative/normal/unremarkable except as stated in the  HPI  ____________________________________________   PHYSICAL EXAM:  VITAL SIGNS: ED Triage Vitals [10/29/18 1051]  Enc Vitals Group     BP 134/67     Pulse Rate 82     Resp 18     Temp 98.3 F (36.8 C)     Temp Source Oral     SpO2 100 %     Weight 149 lb 14.6 oz (68 kg)     Height 5\' 1"  (1.549 m)     Head Circumference      Peak Flow      Pain Score 2     Pain Loc      Pain Edu?      Excl. in [place]?    Constitutional: Alert and oriented. Well appearing and in no distress. Eyes: Conjunctivae are icteric. Normal extraocular movements. Cardiovascular: Normal rate, regular rhythm. No murmurs, rubs, or gallops. Respiratory: Normal respiratory effort without tachypnea nor retractions. Breath sounds are clear and equal bilaterally. No wheezes/rales/rhonchi. Gastrointestinal: Soft and nontender. Normal bowel sounds Musculoskeletal: Nontender with normal range of motion in extremities. No lower extremity tenderness nor edema. Neurologic:  Normal speech and language. No gross focal neurologic deficits are appreciated.  Skin: Jaundice is noted Psychiatric: Mood and affect are normal. Speech and behavior are normal.  ____________________________________________  ED COURSE:  As part of my medical decision making, I reviewed the following data within the [place] History obtained from family if available, nursing notes, old chart and ekg, as well as notes from prior ED visits. Patient presented for worsening jaundice, we will assess with labs and imaging as indicated at this time.   Procedures  Benjamine [name]  A Armato was evaluated in Emergency Department on 10/29/2018 for the symptoms described in the history of present illness. She was evaluated in the context of the global COVID-19 pandemic, which necessitated consideration that the patient might be at risk for infection with the SARS-CoV-2 virus that causes COVID-19. Institutional protocols and algorithms that pertain to  the evaluation of patients at risk for COVID-19 are in a state of rapid change based on information released by regulatory bodies including the CDC and federal and state organizations. These policies and algorithms were followed during the patient's care in the ED.  ____________________________________________   LABS (pertinent positives/negatives)  Labs Reviewed  CBC WITH DIFFERENTIAL/PLATELET - Abnormal; Notable for the following components:      Result Value   RBC 3.73 (*)    HCT 35.7 (*)    RDW 17.9 (*)    All other components within normal limits  COMPREHENSIVE METABOLIC PANEL - Abnormal; Notable for the following components:   Potassium 3.0 (*)    CO2 21 (*)    Glucose, Bld 110 (*)    Creatinine, Ser 0.32 (*)    Albumin 2.6 (*)    AST 119 (*)    ALT 79 (*)    Alkaline Phosphatase 399 (*)    Total Bilirubin 18.2 (*)    All other components within normal limits  BILIRUBIN, DIRECT - Abnormal; Notable for the following components:   Bilirubin, Direct 10.2 (*)    All other components within normal limits  LIPASE, BLOOD - Abnormal; Notable for the following components:   Lipase 74 (*)    All other components within normal limits  PROTIME-INR    RADIOLOGY Images were viewed by me  Right upper quadrant ultrasound IMPRESSION:  Cholelithiasis is noted with mild gallbladder wall thickening and  pericholecystic fluid, and some degree of sludge within gallbladder  lumen. Mild gallbladder distention is noted. Cholecystitis cannot be  excluded and clinical correlation is recommended.   Significant intrahepatic and extrahepatic biliary dilatation is  noted concerning for distal common bile duct obstruction,  potentially due to pancreatic neoplasm as described on prior CT  scan.  ____________________________________________   DIFFERENTIAL DIAGNOSIS   Obstructive jaundice, cholecystitis, biliary colic, liver failure  FINAL ASSESSMENT AND PLAN  Obstructive jaundice,  hyperbilirubinemia   Plan: The patient had presented for worsening jaundice. Patient's labs do indicate worsening jaundice. Patient's imaging revealed cholelithiasis as well as intra-and extrahepatic biliary dilatation likely due to pancreatic neoplasm.  I have discussed with Dr. Allen Norris who may possibly be able to place a stent.   Laurence Aly, MD    Note: This note was generated in part or whole with voice recognition software. Voice recognition is usually quite accurate but there are transcription errors that can and very often do occur. I apologize for any typographical errors that were not detected and corrected.     Earleen Newport, MD 10/29/18 1424    Earleen Newport, MD 10/29/18 1440

## 2018-10-29 NOTE — ED Notes (Signed)
ED TO INPATIENT HANDOFF REPORT  ED Nurse Name and Phone #: Annie Main [phone]  S Name/Age/Gender Melissa [name] 78 y.o. female Room/Bed: ED07A/ED07A  Code Status   Code Status: Full Code  Home/SNF/Other Home Patient oriented to: self, place, time and situation Is this baseline? Yes   Triage Complete: Triage complete  Chief Complaint brought by kc/jaundice  Triage Note Pt sent to ER from [place] center. Pt has reported elevated bilirubin at PCP X 1 week ago-followed up with [facility] today. Reports mild generalized abdominal soreness. Bilirubin of 15.6 on 10/19/18   Allergies No Known Allergies  Level of Care/Admitting Diagnosis ED Disposition    ED Disposition Condition [facility] Area: [facility] [[id number]]  Level of Care: Med-Surg [16]  Covid Evaluation: Asymptomatic Screening Protocol (No Symptoms)  Diagnosis: Obstructive jaundice due to malignant neoplasm [facility]) [[id number]]  Admitting Physician: Eula Flax  Attending Physician: Rufina Falco ACHIENG [[id number]]  Estimated length of stay: past midnight tomorrow  Certification:: I certify this patient will need inpatient services for at least 2 midnights  PT Class (Do Not Modify): Inpatient [101]  PT Acc Code (Do Not Modify): Private [1]       B Medical/Surgery History Past Medical History:  Diagnosis Date  . Glaucoma   . Hypertension   . Osteopenia   . Stroke [facility])    Past Surgical History:  Procedure Laterality Date  . BREAST BIOPSY Right    core- stereo- neg  . HIP ARTHROPLASTY Left 01/02/2016   Procedure: ARTHROPLASTY BIPOLAR HIP (HEMIARTHROPLASTY);  Surgeon: Dereck Leep, MD;  Location: [facility] ORS;  Service: Orthopedics;  Laterality: Left;  . TONSILLECTOMY       A IV Location/Drains/Wounds Patient Lines/Drains/Airways Status   Active Line/Drains/Airways    Name:   Placement date:   Placement time:   Site:   Days:   Peripheral IV 10/29/18 Left  Antecubital   10/29/18    1316    Antecubital   less than 1   Incision (Closed) 01/02/16 Hip Left   01/02/16    2348     1031          Intake/Output Last 24 hours No intake or output data in the 24 hours ending 10/29/18 1700  Labs/Imaging Results for orders placed or performed during the hospital encounter of 10/29/18 (from the past 48 hour(s))  CBC with Differential/Platelet     Status: Abnormal   Collection Time: 10/29/18  1:18 PM  Result Value Ref Range   WBC 8.7 4.0 - 10.5 K/uL   RBC 3.73 (L) 3.87 - 5.11 MIL/uL   Hemoglobin 12.0 12.0 - 15.0 g/dL   HCT 35.7 (L) 36.0 - 46.0 %   MCV 95.7 80.0 - 100.0 fL   MCH 32.2 26.0 - 34.0 pg   MCHC 33.6 30.0 - 36.0 g/dL   RDW 17.9 (H) 11.5 - 15.5 %   Platelets 322 150 - 400 K/uL   nRBC 0.0 0.0 - 0.2 %   Neutrophils Relative % 79 %   Neutro Abs 6.9 1.7 - 7.7 K/uL   Lymphocytes Relative 8 %   Lymphs Abs 0.7 0.7 - 4.0 K/uL   Monocytes Relative 9 %   Monocytes Absolute 0.8 0.1 - 1.0 K/uL   Eosinophils Relative 2 %   Eosinophils Absolute 0.2 0.0 - 0.5 K/uL   Basophils Relative 1 %   Basophils Absolute 0.1 0.0 - 0.1 K/uL   Immature Granulocytes 1 %  Abs Immature Granulocytes 0.05 0.00 - 0.07 K/uL    Comment: Performed at [facility], [address] [place], [place] [address]  Comprehensive metabolic panel     Status: Abnormal   Collection Time: 10/29/18  1:18 PM  Result Value Ref Range   Sodium 135 135 - 145 mmol/L   Potassium 3.0 (L) 3.5 - 5.1 mmol/L   Chloride 104 98 - 111 mmol/L   CO2 21 (L) 22 - 32 mmol/L   Glucose, Bld 110 (H) 70 - 99 mg/dL   BUN 13 8 - 23 mg/dL   Creatinine, Ser 0.32 (L) 0.44 - 1.00 mg/dL   Calcium 9.0 8.9 - 10.3 mg/dL   Total Protein 7.0 6.5 - 8.1 g/dL   Albumin 2.6 (L) 3.5 - 5.0 g/dL   AST 119 (H) 15 - 41 U/L   ALT 79 (H) 0 - 44 U/L   Alkaline Phosphatase 399 (H) 38 - 126 U/L   Total Bilirubin 18.2 (HH) 0.3 - 1.2 mg/dL    Comment: CRITICAL RESULT CALLED TO, READ BACK BY AND VERIFIED WITH DR.  Lenise Arena ON 10/29/2018  AT 1423. TIK/MLK    GFR calc non Af Amer >60 >60 mL/min   GFR calc Af Amer >60 >60 mL/min   Anion gap 10 5 - 15    Comment: Performed at [facility], [address] [place], [place] [address]  Bilirubin, direct     Status: Abnormal   Collection Time: 10/29/18  1:18 PM  Result Value Ref Range   Bilirubin, Direct 10.2 (H) 0.0 - 0.2 mg/dL    Comment: RESULT CONFIRMED BY MANUAL DILUTION Performed at [facility], [address] [place], [place] [address]   Protime-INR     Status: None   Collection Time: 10/29/18  1:18 PM  Result Value Ref Range   Prothrombin Time 14.4 11.4 - 15.2 seconds   INR 1.1 0.8 - 1.2    Comment: (NOTE) INR goal varies based on device and disease states. Performed at [facility], [address] [place], [place] [address]   Lipase, blood     Status: Abnormal   Collection Time: 10/29/18  1:18 PM  Result Value Ref Range   Lipase 74 (H) 11 - 51 U/L    Comment: Performed at [facility], [address] [place], [place] [address]   US Abdomen Limited Ruq  Result Date: 10/29/2018 CLINICAL DATA:  Worsening jaundice. EXAM: ULTRASOUND ABDOMEN LIMITED RIGHT UPPER QUADRANT COMPARISON:  CT scan of October 20, 2018. FINDINGS: Gallbladder: Mild gallbladder distention is noted. Cholelithiasis is noted with mild gallbladder wall thickening and pericholecystic fluid. Some degree of sludge is present as well. No sonographic Murphy's sign is noted. Common bile duct: Diameter: Measures 17 mm proximally and 13 mm distally, consistent with distal common bile duct obstruction. Liver: No focal lesion identified. Within normal limits in parenchymal echogenicity. Intrahepatic biliary dilatation is noted. Portal vein is patent on color Doppler imaging with normal direction of blood flow towards the liver. Other: None. IMPRESSION: Cholelithiasis is noted with mild gallbladder wall thickening and pericholecystic fluid,  and some degree of sludge within gallbladder lumen. Mild gallbladder distention is noted. Cholecystitis cannot be excluded and clinical correlation is recommended. Significant intrahepatic and extrahepatic biliary dilatation is noted concerning for distal common bile duct obstruction, potentially due to pancreatic neoplasm as described on prior CT scan. Electronically Signed   By: Marijo Conception M.D.   On: 10/29/2018 12:55  Pending Labs Unresulted Labs (From admission, onward)    Start     Ordered   10/30/18 0500  Comprehensive metabolic panel  Tomorrow morning,   STAT     10/29/18 1641   10/30/18 0500  CBC  Tomorrow morning,   STAT     10/29/18 1641   10/30/18 0500  Magnesium  Tomorrow morning,   STAT     10/29/18 1641   10/29/18 1603  SARS Coronavirus 2 [facility] order, Performed in [place] hospital lab) Nasopharyngeal Nasopharyngeal Swab  (Symptomatic/High Risk of Exposure/Tier 1 Patients Labs with Precautions)  Once,   STAT    Question Answer Comment  Is this test for diagnosis or screening Screening   Symptomatic for COVID-19 as defined by CDC Unknown   Hospitalized for COVID-19 Unknown   Admitted to ICU for COVID-19 Unknown   Previously tested for COVID-19 Unknown   Resident in a congregate (group) care setting Unknown   Employed in healthcare setting Unknown   Pregnant Unknown      10/29/18 1602          Vitals/Pain Today's Vitals   10/29/18 1500 10/29/18 1530 10/29/18 1600 10/29/18 1630  BP: 138/70 133/63 107/71 (!) 111/57  Pulse: 80 77 85 86  Resp:    18  Temp:      TempSrc:      SpO2: 99% 99% 98% 98%  Weight:      Height:      PainSc:        Isolation Precautions No active isolations  Medications Medications  amLODipine (NORVASC) tablet 10 mg (has no administration in time range)  0.9 %  sodium chloride infusion (has no administration in time range)  potassium chloride SA (K-DUR) CR tablet 40 mEq (40 mEq Oral Given 10/29/18 1652)  propofol (DIPRIVAN)  10 mg/mL bolus/IV push (has no administration in time range)  lidocaine (XYLOCAINE) 2 % injection (has no administration in time range)    Mobility walks with person assist Low fall risk     R Recommendations: See Admitting Provider Note  Report given to:   Additional Notes:

## 2018-10-29 NOTE — Progress Notes (Signed)
Met with Mrs. Tomerlin before and during consult with Dr. Tasia Catchings. She reports she did not notice any changes in her health, other than being tired, until her EMS neighbor came over and noticed her jaundice. She instructed her to go get some labs performed. Bilirubin was elevated to 15.6 on 10/19/18 and CA 19-9 was 1500 on 10/21/18. CT scan performed 10/20/18: IMPRESSION: 1. Moderate to marked intra and extrahepatic biliary dilatation with mild diffuse dilatation of the pancreatic duct. Diffuse enlargement and indistinct appearance of the pancreatic head, constellation of findings is suspect for biliary obstruction secondary to malignant stricture/possible pancreatic head mass. Additional finding of small cystic lesions within the distal body of the pancreas which could be more thoroughly evaluated with MRI 2. Multiple calcified gallstones. Gallbladder slightly enlarged and there may be slight gallbladder wall edema or thickening 3. Multiple enlarged porta hepatis, peripancreatic, and retroperitoneal nodes, raising concern for metastatic disease  She cared for her 76 year old mother up until yesterday, when she had her placed at [facility]. She also cares for her husband who is in his 61's. She reports he falls at times which is why her EMS neighbor came over. She needed assist getting him up. She fell 4 years ago and fractured her hip which required replacement. She uses a cane to get around and is slow moving. She does not have any transportation issues or food insecurity at this time. Her husband is in the parking lot during the visit while her daughter is present on face time.  Dr. Tasia Catchings plans for hospital admit. Requires GI intervention for likely stenting. She will follow during hospitalization.

## 2018-10-29 NOTE — Progress Notes (Signed)
Hematology/Oncology Consult note [facility] Telephone:([phone] [phone]   Patient Care Team: Melissa [name] as PCP - General (Internal Medicine)  REFERRING PROVIDER: Adin Hector, [name]  CHIEF COMPLAINTS/REASON FOR VISIT:  Evaluation of abnormal pancreatic disease  HISTORY OF PRESENTING ILLNESS:   Melissa [name] is a  78 y.o.  female with PMH listed below was seen in consultation at the request of  Melissa [name]  for evaluation of abnormal pancreatic disease. Patient was recently seen by primary care provider Melissa [name] for evaluation of nausea, diarrhea and jaundice.  Blood work on 10/19/2018 showed potassium 2.9, bilirubin 15, alkaline phosphatase 478, AST 114, ALT 95. Abdomen pelvis CT scan on 10/20/2018 showed moderate to market intra-and extra hepatic biliary dilation with mild diffuse dilatation of the pancreatic duct. Patient has had poor appetite and has lost a 5 to 10 pounds recently. She also had diarrhea.  She noticed sand like stool for 1- 2 weeks.  She takes care of her 52 year old mother and recently feels she is not able to take care of her anymore due to progressively worsening weakness and fatigue.  She placed her mother to assisted living yesterday. She has had discussion with Melissa [name] about her blood work and CT scans.  She understands that there is a strong suspicion of cancer.  Nonessential medication has been stopped. CA 19-9 was check on 10/21/2018, level was elevated at 1500.  Today she denies any pain.  Itchy all over her body.. Per patient's request, patient's daughter Melissa [name] was Melissa [name] and Melissa [name] was able to hear entire clinical encounter conversation and participate in the reported history and discussion.    Review of Systems  Constitutional: Positive for appetite change, fatigue and unexpected weight change. Negative for chills and fever.  HENT:   Negative for hearing loss and voice change.   Eyes: Negative  for eye problems.  Respiratory: Negative for chest tightness and cough.   Cardiovascular: Negative for chest pain.  Gastrointestinal: Negative for abdominal distention, abdominal pain and blood in stool.  Endocrine: Negative for hot flashes.  Genitourinary: Negative for difficulty urinating and frequency.   Musculoskeletal: Negative for arthralgias.  Skin: Positive for itching. Negative for rash.       Yellow skin  Neurological: Negative for extremity weakness.  Hematological: Negative for adenopathy.  Psychiatric/Behavioral: Negative for confusion.    MEDICAL HISTORY:  Past Medical History:  Diagnosis Date   Hypertension    Stroke [facility])     SURGICAL HISTORY: Past Surgical History:  Procedure Laterality Date   BREAST BIOPSY Right    core- stereo- neg   HIP ARTHROPLASTY Left 01/02/2016   Procedure: ARTHROPLASTY BIPOLAR HIP (HEMIARTHROPLASTY);  Surgeon: Melissa Leep, [name];  Location: [facility] ORS;  Service: Orthopedics;  Laterality: Left;    SOCIAL HISTORY: Social History   Socioeconomic History   Marital status: Married    Spouse name: Not on file   Number of children: Not on file   Years of education: Not on file   Highest education level: Not on file  Occupational History   Not on file  Social Needs   Financial resource strain: Not on file   Food insecurity    Worry: Not on file    Inability: Not on file   Transportation needs    Medical: Not on file    Non-medical: Not on file  Tobacco Use   Smoking status: Never Smoker   Smokeless tobacco: Never  Used  Substance and Sexual Activity   Alcohol use: No   Drug use: Not on file   Sexual activity: Not on file  Lifestyle   Physical activity    Days per week: Not on file    Minutes per session: Not on file   Stress: Not on file  Relationships   Social connections    Talks on phone: Not on file    Gets together: Not on file    Attends religious service: Not on file    Active member of club or  organization: Not on file    Attends meetings of clubs or organizations: Not on file    Relationship status: Not on file   Intimate partner violence    Fear of current or ex partner: Not on file    Emotionally abused: Not on file    Physically abused: Not on file    Forced sexual activity: Not on file  Other Topics Concern   Not on file  Social History Narrative   Not on file    FAMILY HISTORY: Family History  Problem Relation Age of Onset   Breast cancer Neg Hx     ALLERGIES:  has No Known Allergies.  MEDICATIONS:  Current Outpatient Medications  Medication Sig Dispense Refill   amLODipine (NORVASC) 10 MG tablet Take 10 mg by mouth daily.     potassium chloride (K-DUR) 10 MEQ tablet Take 10 mEq by mouth 2 (two) times daily.     enoxaparin (LOVENOX) 40 MG/0.4ML injection Inject 0.4 mLs (40 mg total) into the skin daily. (Patient not taking: Reported on 10/29/2018) 14 Syringe 0   No current facility-administered medications for this visit.      PHYSICAL EXAMINATION: ECOG PERFORMANCE STATUS: 1 - Symptomatic but completely ambulatory Vitals:   10/29/18 1004  BP: 112/61  Pulse: 84  Temp: 98.2 F (36.8 C)  SpO2: 100%   Filed Weights   10/29/18 1004  Weight: 150 lb 4 oz (68.2 kg)    Physical Exam Constitutional:      General: She is not in acute distress. HENT:     Head: Normocephalic and atraumatic.  Eyes:     General: Scleral icterus present.     Pupils: Pupils are equal, round, and reactive to light.  Neck:     Musculoskeletal: Normal range of motion and neck supple.  Cardiovascular:     Rate and Rhythm: Normal rate and regular rhythm.     Heart sounds: Normal heart sounds.  Pulmonary:     Effort: Pulmonary effort is normal. No respiratory distress.     Breath sounds: Normal breath sounds.  Abdominal:     General: Bowel sounds are normal.     Palpations: Abdomen is soft.     Tenderness: There is no abdominal tenderness.  Musculoskeletal: Normal  range of motion.        General: No deformity.  Skin:    General: Skin is warm and dry.     Coloration: Skin is jaundiced.     Findings: No erythema or rash.  Neurological:     Mental Status: She is alert and oriented to person, place, and time.     Cranial Nerves: No cranial nerve deficit.     Coordination: Coordination normal.  Psychiatric:        Behavior: Behavior normal.        Thought Content: Thought content normal.     LABORATORY DATA:  I have reviewed the data as listed Lab Results  Component Value Date   WBC 13.2 (H) 01/04/2016   HGB 12.7 01/04/2016   HCT 36.8 01/04/2016   MCV 94.8 01/04/2016   PLT 173 01/04/2016   No results for input(s): NA, K, CL, CO2, GLUCOSE, BUN, CREATININE, CALCIUM, GFRNONAA, GFRAA, PROT, ALBUMIN, AST, ALT, ALKPHOS, BILITOT, BILIDIR, IBILI in the last 8760 hours. Iron/TIBC/Ferritin/ %Sat No results found for: IRON, TIBC, FERRITIN, IRONPCTSAT    RADIOGRAPHIC STUDIES: I have personally reviewed the radiological images as listed and agreed with the findings in the report.  Ct Abdomen Pelvis W Contrast  Result Date: 10/20/2018 CLINICAL DATA:  Jaundice EXAM: CT ABDOMEN AND PELVIS WITH CONTRAST TECHNIQUE: Multidetector CT imaging of the abdomen and pelvis was performed using the standard protocol following bolus administration of intravenous contrast. CONTRAST:  137mL OMNIPAQUE IOHEXOL 300 MG/ML  SOLN COMPARISON:  None. FINDINGS: Lower chest: Lung bases demonstrate no acute consolidation or effusion. The heart size is within normal limits. Small hiatal hernia. Hepatobiliary: Subcentimeter hypodensity left hepatic lobe, too small to further characterize but probably a small cyst. Moderate intra hepatic biliary dilatation. Enlarged extrahepatic common bile duct, measuring up to 16 mm in diameter on coronal views. Distended gallbladder with numerous calcified stones. There may be slight gallbladder wall thickening or edema. Pancreas: Mild diffuse  enlargement of the pancreatic head which appears somewhat indistinct. Mild hazy edema around the pancreatic head. Suspect indistinct mass at the head of pancreas. Mild pancreatic ductal dilatation. 11 mm indeterminate cystic lesion distal body/tail of pancreas, series 2, image number 27. Spleen: Normal in size without focal abnormality. Adrenals/Urinary Tract: Adrenal glands are unremarkable. Kidneys are normal, without renal calculi, focal lesion, or hydronephrosis. Bladder is unremarkable. Stomach/Bowel: Stomach is within normal limits. Appendix appears normal. No evidence of bowel wall thickening, distention, or inflammatory changes. Vascular/Lymphatic: Nonaneurysmal aorta. Mild aortic atherosclerosis. Multiple enlarged peripancreatic and porta hepatis lymph nodes measuring up to 2.2 cm, coronal series 5, image number 34. Enlarged retroperitoneal/Peri aortic lymph nodes, measuring up to 2.2 cm. Central low density within the periaortic nodes, possibly due to necrosis. Reproductive: Uterus and bilateral adnexa are unremarkable. Other: Negative for free air or free fluid. Small fat in the umbilical region. Musculoskeletal: No acute or significant osseous findings. IMPRESSION: 1. Moderate to marked intra and extrahepatic biliary dilatation with mild diffuse dilatation of the pancreatic duct. Diffuse enlargement and indistinct appearance of the pancreatic head, constellation of findings is suspect for biliary obstruction secondary to malignant stricture/possible pancreatic head mass. Additional finding of small cystic lesions within the distal body of the pancreas which could be more thoroughly evaluated with MRI 2. Multiple calcified gallstones. Gallbladder slightly enlarged and there may be slight gallbladder wall edema or thickening 3. Multiple enlarged porta hepatis, peripancreatic, and retroperitoneal nodes, raising concern for metastatic disease. Electronically Signed   By: Donavan Foil M.D.   On: 10/20/2018  18:41      ASSESSMENT & PLAN:  1. Obstructive jaundice due to malignant neoplasm ([place])   2. Dilation of pancreatic duct   3. Liver mass    #Labs were reviewed.  CT images were independently reviewed. Results were discussed with the patient in the clinic, also discussed with her daughter very FaceTime-requested by patient. 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 Discussed with patient about options.  Urgent relief of obstruction is recommended. We discussed about option of patient going to emergency room, repeat blood work, evaluate by gastroenterology evaluate patient,  hopefully speed up her work-up.  Patient and family members agree with the plan. Once tissue diagnosis is established, we will discuss more details on options of treatments CT chest to complete staging CODE STATUS was discussed.  Patient says that she has paperwork at home and she is DNR/DNI. ER was called and I spoke to triage nurse and gave update.   All questions were answered. The patient knows to call the clinic with any problems questions or concerns.  cc Melissa [name]   Return of visit: To be determined. Thank you for this kind referral and the opportunity to participate in the care of this patient. A copy of today's note is routed to referring provider  Total face to face encounter time for this patient visit was 60 min. >50% of the time was  spent in counseling and coordination of care.    Earlie Server, MD, PhD Hematology Oncology [facility] at [facility] Pager- [phone] 10/29/2018

## 2018-10-29 NOTE — ED Triage Notes (Addendum)
Pt sent to ER from [place] center. Pt has reported elevated bilirubin at PCP X 1 week ago-followed up with [facility] today. Reports mild generalized abdominal soreness. Bilirubin of 15.6 on 10/19/18

## 2018-10-29 NOTE — ED Notes (Signed)
Patient transported to Ultrasound 

## 2018-10-29 NOTE — Progress Notes (Signed)
Advance care planning  Purpose of Encounter Obstructive jaundice and pancreatic cancer  Parties in Attendance Patient  Patients Decisional capacity Alert and oriented.  Able to make medical decisions  No documented healthcare power of attorney or ACP documents in place  Discussed in detail regarding Obstructive jaundice and pancreatic cancer.  Treatment plan , prognosis discussed.  All questions answered.  CODE STATUS discussed and patient wishes her CODE STATUS to be changed to DNR/DNI.  Orders entered and CODE STATUS changed  DNR/DNI  Time spent - 17 minutes

## 2018-10-29 NOTE — Plan of Care (Signed)
  Problem: Education: Goal: Knowledge of [place] [facility] Education information/materials will improve Outcome: Progressing Goal: Emotional status will improve Outcome: Progressing Goal: Mental status will improve Outcome: Progressing Goal: Verbalization of understanding the information provided will improve Outcome: Progressing   Problem: Activity: Goal: Interest or engagement in activities will improve Outcome: Progressing Goal: Sleeping patterns will improve Outcome: Progressing   Problem: Coping: Goal: Ability to verbalize frustrations and anger appropriately will improve Outcome: Progressing Goal: Ability to demonstrate self-control will improve Outcome: Progressing   Problem: Health Behavior/Discharge Planning: Goal: Identification of resources available to assist in meeting health care needs will improve Outcome: Progressing Goal: Compliance with treatment plan for underlying cause of condition will improve Outcome: Progressing   Problem: Physical Regulation: Goal: Ability to maintain clinical measurements within normal limits will improve Outcome: Progressing   Problem: Safety: Goal: Periods of time without injury will increase Outcome: Progressing   

## 2018-10-29 NOTE — Anesthesia Preprocedure Evaluation (Signed)
Anesthesia Evaluation  Patient identified by MRN, date of birth, ID band Patient awake    Reviewed: Allergy & Precautions, NPO status , Patient's Chart, lab work & pertinent test results, reviewed documented beta blocker date and time   Airway Mallampati: II  TM Distance: >3 FB     Dental  (+) Chipped   Pulmonary           Cardiovascular hypertension, Pt. on medications      Neuro/Psych CVA, No Residual Symptoms    GI/Hepatic   Endo/Other    Renal/GU      Musculoskeletal   Abdominal   Peds  Hematology   Anesthesia Other Findings   Reproductive/Obstetrics                             Anesthesia Physical Anesthesia Plan  ASA: III  Anesthesia Plan: General   Post-op Pain Management:    Induction: Intravenous  PONV Risk Score and Plan:   Airway Management Planned:   Additional Equipment:   Intra-op Plan:   Post-operative Plan:   Informed Consent: I have reviewed the patients History and Physical, chart, labs and discussed the procedure including the risks, benefits and alternatives for the proposed anesthesia with the patient or authorized representative who has indicated his/her understanding and acceptance.       Plan Discussed with: CRNA  Anesthesia Plan Comments:         Anesthesia Quick Evaluation

## 2018-10-30 DIAGNOSIS — C801 Malignant (primary) neoplasm, unspecified: Secondary | ICD-10-CM

## 2018-10-30 DIAGNOSIS — K831 Obstruction of bile duct: Secondary | ICD-10-CM

## 2018-10-30 LAB — CBC
HCT: 30.5 % — ABNORMAL LOW (ref 36.0–46.0)
Hemoglobin: 10.4 g/dL — ABNORMAL LOW (ref 12.0–15.0)
MCH: 32 pg (ref 26.0–34.0)
MCHC: 34.1 g/dL (ref 30.0–36.0)
MCV: 93.8 fL (ref 80.0–100.0)
Platelets: 318 10*3/uL (ref 150–400)
RBC: 3.25 MIL/uL — ABNORMAL LOW (ref 3.87–5.11)
RDW: 17.5 % — ABNORMAL HIGH (ref 11.5–15.5)
WBC: 8.4 10*3/uL (ref 4.0–10.5)
nRBC: 0 % (ref 0.0–0.2)

## 2018-10-30 LAB — COMPREHENSIVE METABOLIC PANEL
ALT: UNDETERMINED U/L (ref 0–44)
AST: 91 U/L — ABNORMAL HIGH (ref 15–41)
Albumin: 2 g/dL — ABNORMAL LOW (ref 3.5–5.0)
Alkaline Phosphatase: 344 U/L — ABNORMAL HIGH (ref 38–126)
Anion gap: 8 (ref 5–15)
BUN: 14 mg/dL (ref 8–23)
CO2: 20 mmol/L — ABNORMAL LOW (ref 22–32)
Calcium: 8 mg/dL — ABNORMAL LOW (ref 8.9–10.3)
Chloride: 110 mmol/L (ref 98–111)
Creatinine, Ser: UNDETERMINED mg/dL (ref 0.44–1.00)
Glucose, Bld: 114 mg/dL — ABNORMAL HIGH (ref 70–99)
Potassium: 3.2 mmol/L — ABNORMAL LOW (ref 3.5–5.1)
Sodium: 138 mmol/L (ref 135–145)
Total Bilirubin: 14.7 mg/dL — ABNORMAL HIGH (ref 0.3–1.2)
Total Protein: 5.9 g/dL — ABNORMAL LOW (ref 6.5–8.1)

## 2018-10-30 LAB — MAGNESIUM: Magnesium: 1.7 mg/dL (ref 1.7–2.4)

## 2018-10-30 MED ORDER — HYDROXYZINE HCL 25 MG PO TABS
25.0000 mg | ORAL_TABLET | Freq: Four times a day (QID) | ORAL | Status: DC | PRN
  Administered 2018-10-30 – 2018-10-31 (×3): 25 mg via ORAL
  Filled 2018-10-30 (×3): qty 1

## 2018-10-30 MED ORDER — ENOXAPARIN SODIUM 40 MG/0.4ML ~~LOC~~ SOLN
40.0000 mg | SUBCUTANEOUS | Status: DC
  Administered 2018-10-30: 40 mg via SUBCUTANEOUS
  Filled 2018-10-30: qty 0.4

## 2018-10-30 MED ORDER — OXYCODONE HCL 5 MG PO TABS
5.0000 mg | ORAL_TABLET | Freq: Once | ORAL | Status: AC
  Administered 2018-10-30: 5 mg via ORAL
  Filled 2018-10-30: qty 1

## 2018-10-30 MED ORDER — POTASSIUM CHLORIDE CRYS ER 20 MEQ PO TBCR
40.0000 meq | EXTENDED_RELEASE_TABLET | Freq: Once | ORAL | Status: AC
  Administered 2018-10-30: 40 meq via ORAL
  Filled 2018-10-30: qty 2

## 2018-10-30 NOTE — Progress Notes (Signed)
[place] at [place] NAME: Melissa [name]    MR#:  [id number]  DATE OF BIRTH:  06/12/1940  SUBJECTIVE:  CHIEF COMPLAINT:   Chief Complaint  Patient presents with  . Abnormal Lab   Still weak.  Abdominal pain is improved.  Tolerating liquid diet.  REVIEW OF SYSTEMS:    Review of Systems  Constitutional: Positive for malaise/fatigue. Negative for chills and fever.  HENT: Negative for sore throat.   Eyes: Negative for blurred vision, double vision and pain.  Respiratory: Negative for cough, hemoptysis, shortness of breath and wheezing.   Cardiovascular: Negative for chest pain, palpitations, orthopnea and leg swelling.  Gastrointestinal: Positive for abdominal pain. Negative for constipation, diarrhea, heartburn, nausea and vomiting.  Genitourinary: Negative for dysuria and hematuria.  Musculoskeletal: Negative for back pain and joint pain.  Skin: Negative for rash.  Neurological: Negative for sensory change, speech change, focal weakness and headaches.  Endo/Heme/Allergies: Does not bruise/bleed easily.  Psychiatric/Behavioral: Negative for depression. The patient is not nervous/anxious.     DRUG ALLERGIES:  No Known Allergies  VITALS:  Blood pressure 128/67, pulse 76, temperature 98.3 F (36.8 C), temperature source Oral, resp. rate 16, height 5\' 1"  (1.549 m), weight 67.2 kg, SpO2 96 %.  PHYSICAL EXAMINATION:   Physical Exam  GENERAL:  78 y.o.-year-old patient lying in the bed with no acute distress.  Jaundiced EYES: Pupils equal, round, reactive to light and accommodation. + scleral icterus. Extraocular muscles intact.  HEENT: Head atraumatic, normocephalic. Oropharynx and nasopharynx clear.  NECK:  Supple, no jugular venous distention. No thyroid enlargement, no tenderness.  LUNGS: Normal breath sounds bilaterally, no wheezing, rales, rhonchi. No use of accessory muscles of respiration.  CARDIOVASCULAR: S1, S2 normal. No  murmurs, rubs, or gallops.  ABDOMEN: Soft, epigastric tender, nondistended. Bowel sounds present. No organomegaly or mass.  EXTREMITIES: No cyanosis, clubbing or edema b/l.    NEUROLOGIC: Cranial nerves II through XII are intact. No focal Motor or sensory deficits b/l.   PSYCHIATRIC: The patient is alert and oriented x 3.  SKIN: No obvious rash, lesion, or ulcer.   LABORATORY PANEL:   CBC Recent Labs  Lab 10/30/18 0531  WBC 8.4  HGB 10.4*  HCT 30.5*  PLT 318   ------------------------------------------------------------------------------------------------------------------ Chemistries  Recent Labs  Lab 10/30/18 0531  NA 138  K 3.2*  CL 110  CO2 20*  GLUCOSE 114*  BUN 14  CREATININE UNABLE TO REPORT DUE TO ICTERUS  CALCIUM 8.0*  MG 1.7  AST 91*  ALT UNABLE TO REPORT DUE TO ICTERUS  ALKPHOS 344*  BILITOT 14.7*   ------------------------------------------------------------------------------------------------------------------  Cardiac Enzymes No results for input(s): TROPONINI in the last 168 hours. ------------------------------------------------------------------------------------------------------------------  RADIOLOGY:  Dg C-arm 1-60 Min-no Report  Result Date: 10/29/2018 Fluoroscopy was utilized by the requesting physician.  No radiographic interpretation.   US Abdomen Limited Ruq  Result Date: 10/29/2018 CLINICAL DATA:  Worsening jaundice. EXAM: ULTRASOUND ABDOMEN LIMITED RIGHT UPPER QUADRANT COMPARISON:  CT scan of October 20, 2018. FINDINGS: Gallbladder: Mild gallbladder distention is noted. Cholelithiasis is noted with mild gallbladder wall thickening and pericholecystic fluid. Some degree of sludge is present as well. No sonographic Murphy's sign is noted. Common bile duct: Diameter: Measures 17 mm proximally and 13 mm distally, consistent with distal common bile duct obstruction. Liver: No focal lesion identified. Within normal limits in parenchymal  echogenicity. Intrahepatic biliary dilatation is noted. Portal vein is patent on color Doppler imaging with normal direction of  blood flow towards the liver. Other: None. IMPRESSION: Cholelithiasis is noted with mild gallbladder wall thickening and pericholecystic fluid, and some degree of sludge within gallbladder lumen. Mild gallbladder distention is noted. Cholecystitis cannot be excluded and clinical correlation is recommended. Significant intrahepatic and extrahepatic biliary dilatation is noted concerning for distal common bile duct obstruction, potentially due to pancreatic neoplasm as described on prior CT scan. Electronically Signed   By: Marijo Conception M.D.   On: 10/29/2018 12:55     ASSESSMENT AND PLAN:   78 y.o. 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   *Obstructive jaundice secondary to pancreatic mass and biliary stricture Status post ERCP and biliary stent. Bilirubin still high but improving. Repeat labs in the morning. Biliary stent exchange in 3 months.  *Pancreatic cancer, stage IV.  Outpatient follow-up with oncology  *Hypokalemia.  Replace orally.  * Hx of CVA - Aspirin previously stopped  * HTN  -Continue amlodipine  * DVT prophylaxis -Start Lovenox  All the records are reviewed and case discussed with Care Management/Social Worker Management plans discussed with the patient, family and they are in agreement.  CODE STATUS: FULL CODE  DVT Prophylaxis: SCDs  TOTAL TIME TAKING CARE OF THIS PATIENT: 35 minutes.   POSSIBLE D/C IN 1-2 DAYS, DEPENDING ON CLINICAL CONDITION.  Leia Alf Garth Diffley M.D on 10/30/2018 at 2:56 PM  Between 7am to 6pm - Pager - [phone]  After 6pm go  to [email or web address] - password [email or web address] [facility]  [facility] [place] [facility]  Office  [phone]  CC: Primary care physician; Adin Hector, MD  Note: This dictation was prepared with Dragon dictation along with smaller phrase technology. Any transcriptional errors that result from this process are unintentional.

## 2018-10-30 NOTE — Progress Notes (Signed)
Melissa Darby, MD [address]  [place]  [place], [place] [address]  Main: [phone]  Fax: [phone] Pager: [phone]   Subjective: She reports doing well, denies abdominal pain, nausea or vomiting.  She had good sleep last night.  She is tolerating diet well   Objective: Vital signs in last 24 hours: Vitals:   10/29/18 1930 10/29/18 1952 10/30/18 0433 10/30/18 1144  BP: (!) 156/80  135/69 128/67  Pulse: 72  70 76  Resp: 19  16   Temp: 97.6 F (36.4 C)  97.6 F (36.4 C) 98.3 F (36.8 C)  TempSrc: Oral  Oral Oral  SpO2: 100%  99% 96%  Weight:  67.2 kg    Height:  5\' 1"  (1.549 m)     Weight change:   Intake/Output Summary (Last 24 hours) at 10/30/2018 1242 Last data filed at 10/30/2018 0900 Gross per 24 hour  Intake 2327.42 ml  Output 0 ml  Net 2327.42 ml     Exam: Heart:: Regular rate and rhythm, S1S2 present or without murmur or extra heart sounds Lungs: normal and clear to auscultation Abdomen: soft, nontender, normal bowel sounds   Lab Results: CBC Latest Ref Rng & Units 10/30/2018 10/29/2018 01/04/2016  WBC 4.0 - 10.5 K/uL 8.4 8.7 13.2(H)  Hemoglobin 12.0 - 15.0 g/dL 10.4(L) 12.0 12.7  Hematocrit 36.0 - 46.0 % 30.5(L) 35.7(L) 36.8  Platelets 150 - 400 K/uL 318 322 173   CMP Latest Ref Rng & Units 10/30/2018 10/29/2018 01/05/2016  Glucose 70 - 99 mg/dL 114(H) 110(H) -  BUN 8 - 23 mg/dL 14 13 -  Creatinine 0.44 - 1.00 mg/dL UNABLE TO REPORT DUE TO ICTERUS 0.32(L) -  Sodium 135 - 145 mmol/L 138 135 -  Potassium 3.5 - 5.1 mmol/L 3.2(L) 3.0(L) 3.1(L)  Chloride 98 - 111 mmol/L 110 104 -  CO2 22 - 32 mmol/L 20(L) 21(L) -  Calcium 8.9 - 10.3 mg/dL 8.0(L) 9.0 -  Total Protein 6.5 - 8.1 g/dL 5.9(L) 7.0 -  Total Bilirubin 0.3 - 1.2 mg/dL 14.7(H) 18.2(HH) -  Alkaline Phos 38 - 126 U/L 344(H) 399(H) -  AST 15 - 41 U/L 91(H) 119(H) -  ALT 0 - 44 U/L UNABLE TO REPORT DUE TO ICTERUS 79(H) -    Micro Results: Recent Results (from the past 240  hour(s))  SARS Coronavirus 2 [facility] order, Performed in [facility] hospital lab) Nasopharyngeal Nasopharyngeal Swab     Status: None   Collection Time: 10/29/18  4:05 PM   Specimen: Nasopharyngeal Swab  Result Value Ref Range Status   SARS Coronavirus 2 NEGATIVE NEGATIVE Final    Comment: (NOTE) If result is NEGATIVE SARS-CoV-2 target nucleic acids are NOT DETECTED. The SARS-CoV-2 RNA is generally detectable in upper and lower  respiratory specimens during the acute phase of infection. The lowest  concentration of SARS-CoV-2 viral copies this assay can detect is 250  copies / mL. A negative result does not preclude SARS-CoV-2 infection  and should not be used as the sole basis for treatment or other  patient management decisions.  A negative result may occur with  improper specimen collection / handling, submission of specimen other  than nasopharyngeal swab, presence of viral mutation(s) within the  areas targeted by this assay, and inadequate number of viral copies  (<250 copies / mL). A negative result must be combined with clinical  observations, patient history, and epidemiological information. If result is POSITIVE SARS-CoV-2 target nucleic acids are DETECTED. The  SARS-CoV-2 RNA is generally detectable in upper and lower  respiratory specimens dur ing the acute phase of infection.  Positive  results are indicative of active infection with SARS-CoV-2.  Clinical  correlation with patient history and other diagnostic information is  necessary to determine patient infection status.  Positive results do  not rule out bacterial infection or co-infection with other viruses. If result is PRESUMPTIVE POSTIVE SARS-CoV-2 nucleic acids MAY BE PRESENT.   A presumptive positive result was obtained on the submitted specimen  and confirmed on repeat testing.  While 2019 novel coronavirus  (SARS-CoV-2) nucleic acids may be present in the submitted sample  additional confirmatory testing  may be necessary for epidemiological  and / or clinical management purposes  to differentiate between  SARS-CoV-2 and other Sarbecovirus currently known to infect humans.  If clinically indicated additional testing with an alternate test  methodology [phone]) is advised. The SARS-CoV-2 RNA is generally  detectable in upper and lower respiratory sp ecimens during the acute  phase of infection. The expected result is Negative. Fact Sheet for Patients:  StrictlyIdeas.no Fact Sheet for Healthcare Providers: BankingDealers.co.za This test is not yet approved or cleared by the [place] FDA and has been authorized for detection and/or diagnosis of SARS-CoV-2 by FDA under an Emergency Use Authorization (EUA).  This EUA will remain in effect (meaning this test can be used) for the duration of the COVID-19 declaration under Section 564(b)(1) of the Act, 21 U.S.C. section 360bbb-3(b)(1), unless the authorization is terminated or revoked sooner. Performed at [facility], [address] [place], [place] [address]    Studies/Results: Dg C-arm 1-60 Min-no Report  Result Date: 10/29/2018 Fluoroscopy was utilized by the requesting physician.  No radiographic interpretation.   US Abdomen Limited Ruq  Result Date: 10/29/2018 CLINICAL DATA:  Worsening jaundice. EXAM: ULTRASOUND ABDOMEN LIMITED RIGHT UPPER QUADRANT COMPARISON:  CT scan of October 20, 2018. FINDINGS: Gallbladder: Mild gallbladder distention is noted. Cholelithiasis is noted with mild gallbladder wall thickening and pericholecystic fluid. Some degree of sludge is present as well. No sonographic Murphy's sign is noted. Common bile duct: Diameter: Measures 17 mm proximally and 13 mm distally, consistent with distal common bile duct obstruction. Liver: No focal lesion identified. Within normal limits in parenchymal echogenicity. Intrahepatic biliary dilatation is noted. Portal vein is  patent on color Doppler imaging with normal direction of blood flow towards the liver. Other: None. IMPRESSION: Cholelithiasis is noted with mild gallbladder wall thickening and pericholecystic fluid, and some degree of sludge within gallbladder lumen. Mild gallbladder distention is noted. Cholecystitis cannot be excluded and clinical correlation is recommended. Significant intrahepatic and extrahepatic biliary dilatation is noted concerning for distal common bile duct obstruction, potentially due to pancreatic neoplasm as described on prior CT scan. Electronically Signed   By: Marijo Conception M.D.   On: 10/29/2018 12:55   Medications:  I have reviewed the patient's current medications. Prior to Admission:  Medications Prior to Admission  Medication Sig Dispense Refill Last Dose  . acetaminophen (TYLENOL) 500 MG tablet Take 500 mg by mouth every 6 (six) hours as needed.   prn at prn  . amLODipine (NORVASC) 10 MG tablet Take 10 mg by mouth daily.   10/29/2018 at 0800  . potassium chloride (K-DUR) 10 MEQ tablet Take 10 mEq by mouth 2 (two) times daily.   10/29/2018 at 0800   Scheduled: . amLODipine  10 mg Oral Daily   Continuous: . sodium chloride 50 mL/hr at 10/30/18 [phone]  PRN: Anti-infectives (From admission, onward)   None     Scheduled Meds: . amLODipine  10 mg Oral Daily   Continuous Infusions: . sodium chloride 50 mL/hr at 10/30/18 0955   PRN Meds:.   Assessment: Active Problems:   Obstructive jaundice due to malignant neoplasm [facility])   Pancreatic tumor   Obstructive jaundice Status post ERCP with biliary sphincterotomy and stent placement on 10/30/2018, no evidence of post ERCP pancreatitis T bili is trending down  Plan: Monitor LFTs daily, Expect bilirubin to continue to improve Diet as tolerated If bilirubin continues to improve, can be discharged home tomorrow and follow-up with oncology as well as Dr. Allen Norris She will need repeat ERCP in 3 months for stent exchange     LOS: 1 day   Melissa [name] 10/30/2018, 12:42 PM

## 2018-10-31 LAB — COMPREHENSIVE METABOLIC PANEL
ALT: 55 U/L — ABNORMAL HIGH (ref 0–44)
AST: 88 U/L — ABNORMAL HIGH (ref 15–41)
Albumin: 2 g/dL — ABNORMAL LOW (ref 3.5–5.0)
Alkaline Phosphatase: 328 U/L — ABNORMAL HIGH (ref 38–126)
Anion gap: 6 (ref 5–15)
BUN: 11 mg/dL (ref 8–23)
CO2: 22 mmol/L (ref 22–32)
Calcium: 8.1 mg/dL — ABNORMAL LOW (ref 8.9–10.3)
Chloride: 109 mmol/L (ref 98–111)
Creatinine, Ser: 0.33 mg/dL — ABNORMAL LOW (ref 0.44–1.00)
GFR calc Af Amer: >60 mL/min (ref >60)
GFR calc non Af Amer: >60 mL/min (ref >60)
Glucose, Bld: 114 mg/dL — ABNORMAL HIGH (ref 70–99)
Potassium: 3.3 mmol/L — ABNORMAL LOW (ref 3.5–5.1)
Sodium: 137 mmol/L (ref 135–145)
Total Bilirubin: 11 mg/dL — ABNORMAL HIGH (ref 0.3–1.2)
Total Protein: 5.8 g/dL — ABNORMAL LOW (ref 6.5–8.1)

## 2018-10-31 MED ORDER — HYDROXYZINE HCL 25 MG PO TABS: 25.0000 mg | ORAL_TABLET | Freq: Four times a day (QID) | ORAL | 0 refills | Status: DC | PRN

## 2018-10-31 MED ORDER — POTASSIUM CHLORIDE CRYS ER 20 MEQ PO TBCR
40.0000 meq | EXTENDED_RELEASE_TABLET | Freq: Once | ORAL | Status: AC
  Administered 2018-10-31: 09:00:00 40 meq via ORAL
  Filled 2018-10-31: qty 2

## 2018-10-31 NOTE — Discharge Instructions (Signed)
Resume diet and activity as before ° ° °

## 2018-10-31 NOTE — Progress Notes (Signed)
Initial Nutrition Assessment  DOCUMENTATION CODES:   Not applicable  INTERVENTION:  Recommend Ensure Enlive po BID, each supplement provides 350 kcal and 20 grams of protein. Patient now with discharge orders so unable to order at this time.  Encouraged intake of small, frequent meals throughout the day. Discussed choosing calorie- and protein-dense meals.  Consider outpatient work-up to determine if patient has exocrine pancreatic insufficiency and would benefit from Creon.  NUTRITION DIAGNOSIS:   Increased nutrient needs related to catabolic illness(stage IV pancreatic cancer) as evidenced by estimated needs.  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   Met with patient at bedside. She reports she has had a decreased appetite for the past 3 months and has also been experiencing early satiety. 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 She reports caring for them has taken a lot of effort and she did not realize for a while she was not eating well and had lost weight. Her friend recently told her how jaundiced her skin had become and then she went for work-up. She reports she does not eat full meals anymore. She snacks off of what she makes for her husband. Here she has been eating fairly well (70-85%). She is familiar with Ensure. Discussed drinking regularly to help meet calorie/protein needs. She reports frequent watery diarrhea that occurs right after eating.  Patient reports her UBW was 165 lbs (75 kg). Do not see a weight that high in  chart so unsure when weight loss started occurring. She is currently 67.2 kg (148.1 lbs).  Medications reviewed.  Labs reviewed: Potassium 3.3, Creatinine 0.33  Patient does not meet criteria for malnutrition at this time but is at risk for malnutrition.  NUTRITION - FOCUSED PHYSICAL EXAM:    Most Recent Value  Orbital Region  No depletion  Upper Arm Region  Mild depletion  Thoracic and Lumbar Region  Unable to assess  Buccal Region  No depletion  Temple Region  No depletion  Clavicle Bone Region  Mild depletion  Clavicle and Acromion Bone Region  Mild depletion  Scapular Bone Region  No depletion  Dorsal Hand  Mild depletion  Patellar Region  Mild depletion  Anterior Thigh Region  Mild depletion  Posterior Calf Region  Mild depletion  Edema (RD Assessment)  None  Hair  Reviewed  Eyes  Reviewed  Mouth  Reviewed  Skin  Reviewed [jaundice]  Nails  Reviewed     Diet Order:   Diet Order            Diet regular Room service appropriate? Yes; Fluid consistency: Thin  Diet effective now             EDUCATION NEEDS:   Education needs have been addressed  Skin:  Skin Assessment: Reviewed RN Assessment  Last BM:  10/31/2018 per chart  Height:   Ht Readings from Last 1 Encounters:  10/29/18 5' 1" (1.549 m)   Weight:   Wt Readings from Last 1 Encounters:  10/29/18 67.2 kg     Ideal Body Weight:  47.7 kg  BMI:  Body mass index is 27.98 kg/m.  Estimated Nutritional Needs:   Kcal:  1700-1900  Protein:  80-90 grams  Fluid:  1.7-1.9 L/day  Leanne Stephens, MS, RD, LDN Office: [phone] Pager: [phone] After Hours/Weekend Pager: [phone]  

## 2018-10-31 NOTE — Progress Notes (Signed)
Discharge order received. Patient mental status is at baseline. Vital signs stable . No signs of acute distress. Discharge instructions given. Patient verbalized understanding. No other issues noted at this time.   

## 2018-11-01 ENCOUNTER — Encounter: Payer: Self-pay | Admitting: Gastroenterology

## 2018-11-02 ENCOUNTER — Telehealth: Payer: Self-pay

## 2018-11-02 ENCOUNTER — Other Ambulatory Visit: Payer: Self-pay

## 2018-11-02 DIAGNOSIS — C250 Malignant neoplasm of head of pancreas: Secondary | ICD-10-CM | POA: Insufficient documentation

## 2018-11-02 DIAGNOSIS — D490 Neoplasm of unspecified behavior of digestive system: Secondary | ICD-10-CM

## 2018-11-02 NOTE — Telephone Encounter (Signed)
Called and spoke with daughter, [name]. Educated on need for PET scan and follow up with Dr. Tasia Catchings for results of PET and biopsy done during ERCP. PET scan arranged for 8/25 at 0930. [facility] 0900 at the medical mall entrance. Do not eat or drink anything for 6 hours except water. Scan will take 90 minutes. She is not a diabetic. Follow up rescheduled with Dr. Tasia Catchings to 8/26 at 1430. Read back performed. All questions answered.

## 2018-11-04 LAB — SURGICAL PATHOLOGY

## 2018-11-05 ENCOUNTER — Other Ambulatory Visit: Payer: Self-pay | Admitting: Oncology

## 2018-11-05 ENCOUNTER — Telehealth: Payer: Self-pay | Admitting: *Deleted

## 2018-11-05 DIAGNOSIS — K8689 Other specified diseases of pancreas: Secondary | ICD-10-CM

## 2018-11-05 MED ORDER — TRAZODONE HCL 50 MG PO TABS: 50.0000 mg | ORAL_TABLET | Freq: Every evening | ORAL | 0 refills | Status: DC | PRN

## 2018-11-05 NOTE — Telephone Encounter (Signed)
Spoke with daughter, pt is not sleeping. Request medication to help with sleep to be sent to [facility]. Trazodone sent by Dr Tasia Catchings.

## 2018-11-08 NOTE — Discharge Summary (Signed)
[place] [facility] [place] NAME: Melissa [name]    MR#:  [id number]  DATE OF BIRTH:  05/19/1940  DATE OF ADMISSION:  10/29/2018 ADMITTING PHYSICIAN: Melissa Snow, NP  DATE OF DISCHARGE: 10/31/2018  2:29 PM  PRIMARY CARE PHYSICIAN: Melissa Hector, MD   ADMISSION DIAGNOSIS:  Hyperbilirubinemia [E80.6] Obstructive jaundice [K83.1]  DISCHARGE DIAGNOSIS:  Active Problems:   Obstructive jaundice due to malignant neoplasm [facility])   Pancreatic tumor   Obstructive jaundice   SECONDARY DIAGNOSIS:   Past Medical History:  Diagnosis Date  . Glaucoma   . Hypertension   . Osteopenia   . Stroke [facility])      ADMITTING HISTORY  HISTORY OF PRESENT ILLNESS:   78 year old female with past medical history of left hip fracture status post hemiarthroplasty, CVA with minimal right arm weakness as residual deficit, hypertension, hyperlipidemia, glaucoma, subclinical hypothyroidism and osteopenia presenting from oncology clinic with obstructive jaundice due to malignant neoplasm of Melissa pancreas.  Melissa [name] reports onset of symptoms early last month, she states she was busy with her 74 year old mother and sick husband and had not noticed any skin discoloration until her neighbor pointed out to her.  She presented to her PCP on 10/20/2018 for further evaluation and was found to have elevated bilirubin of 15.  Melissa [name] states she has been having mild fatigue and increased itching otherwise denies nausea or vomiting, fevers or chills, diarrhea, or abdominal pain.  She had a CT abdomen which revealed moderate to marked intra-and extrahepatic biliary dilation, enlargement of Melissa pancreatic head, calcified gallstone with slight gallbladder wall edema, and multiple enlarged hepatic, peripancreatic, and retroperitoneal nodes.  Given Melissa finding Melissa [name] was referred to oncologist for further evaluation.  Patientwas seen by oncologist today who reviewed CT images with  Melissa [name] and family full likelihood of malignancy likely stage IV pancreatitis cancer.  Due to significant obstructive jaundice, Melissa [name] was referred to Melissa ED for admission with possible urgent relief of obstruction.  On arrival to Melissa ED, she was afebrile with blood pressure 112/61 mm Hg and pulse rate 84 beats/min. There were no focal neurological deficits; she was alert and oriented x4.  Initial labs revealed unremarkable CBC, potassium 3.0, AST 119, ALT 79, alkaline phosphate 399, total bilirubin 18.2, direct bilirubin 10.2, lipase 74.  Given CT finding as above Melissa [name] will be admitted under hospitalist service for further management.   HOSPITAL COURSE:   78 y.o.femalewith past medical history of left hip fracture status post hemiarthroplasty, CVA with minimal right arm weakness as residual deficit, hypertension, hyperlipidemia, glaucoma, subclinical hypothyroidism and osteopenia presenting from oncology clinic with obstructive jaundice due to malignant neoplasm of Melissa pancreas.   *Obstructive jaundice secondary to pancreatic mass and biliary stricture Status post ERCP and biliary stent. Bilirubin   Still elevated but improved significantly and trending down each day Biliary stent exchange in 3 months.  case discussed with GI and advised discharged home and follow-up as outpatient.  *Pancreatic cancer, stage IV.  Outpatient follow-up with oncology.   Has appointment with Dr. Tasia Catchings  *Hypokalemia.  Replaced orally.  * Hx of CVA - Aspirin previously stopped  *HTN  -Continue amlodipine  *DVT prophylaxis -Started Lovenox  In Melissa hospital   Melissa [name] discharged home in stable condition.  CONSULTS OBTAINED:    Gastroenterology, Oncology  DRUG ALLERGIES:  No Known Allergies  DISCHARGE MEDICATIONS:   Allergies as of 10/31/2018   No Known Allergies     Medication List  TAKE these medications   acetaminophen 500 MG tablet Commonly known as: TYLENOL Take 500 mg by  mouth every 6 (six) hours as needed.   amLODipine 10 MG tablet Commonly known as: NORVASC Take 10 mg by mouth daily.   hydrOXYzine 25 MG tablet Commonly known as: ATARAX/VISTARIL Take 1 tablet (25 mg total) by mouth every 6 (six) hours as needed for itching.   potassium chloride 10 MEQ tablet Commonly known as: K-DUR Take 10 mEq by mouth 2 (two) times daily.       Today   VITAL SIGNS:  Blood pressure (!) 113/52, pulse 69, temperature 98.5 F (36.9 C), temperature source Oral, resp. rate 16, height 5\' 1"  (1.549 m), weight 67.2 kg, SpO2 100 %.  I/O:  No intake or output data in Melissa 24 hours ending 11/08/18 1730  PHYSICAL EXAMINATION:  Physical Exam  GENERAL:  78 y.o.-year-old Melissa [name] lying in Melissa bed with no acute distress.  LUNGS: Normal breath sounds bilaterally, no wheezing, rales,rhonchi or crepitation. No use of accessory muscles of respiration.  CARDIOVASCULAR: S1, S2 normal. No murmurs, rubs, or gallops.  ABDOMEN: Soft, non-tender, non-distended. Bowel sounds present. No organomegaly or mass.  NEUROLOGIC: Moves all 4 extremities. PSYCHIATRIC: Melissa [name] is alert and oriented x 3.  SKIN: No obvious rash, lesion, or ulcer.   DATA REVIEW:   CBC No results for input(s): WBC, HGB, HCT, PLT in Melissa last 168 hours.  Chemistries  No results for input(s): NA, K, CL, CO2, GLUCOSE, BUN, CREATININE, CALCIUM, MG, AST, ALT, ALKPHOS, BILITOT in Melissa last 168 hours.  Invalid input(s): GFRCGP  Cardiac Enzymes No results for input(s): TROPONINI in Melissa last 168 hours.  Microbiology Results  Results for orders placed or performed during Melissa hospital encounter of 10/29/18  SARS Coronavirus 2 [facility] order, Performed in [facility] hospital lab) Nasopharyngeal Nasopharyngeal Swab     Status: None   Collection Time: 10/29/18  4:05 PM   Specimen: Nasopharyngeal Swab  Result Value Ref Range Status   SARS Coronavirus 2 NEGATIVE NEGATIVE Final    Comment: (NOTE) If result is  NEGATIVE SARS-CoV-2 target nucleic acids are NOT DETECTED. Melissa SARS-CoV-2 RNA is generally detectable in upper and lower  respiratory specimens during Melissa acute phase of infection. Melissa lowest  concentration of SARS-CoV-2 viral copies Melissa assay can detect is 250  copies / mL. A negative result does not preclude SARS-CoV-2 infection  and should not be used as Melissa sole basis for treatment or other  Melissa [name] management decisions.  A negative result may occur with  improper specimen collection / handling, submission of specimen other  than nasopharyngeal swab, presence of viral mutation(s) within Melissa  areas targeted by Melissa assay, and inadequate number of viral copies  (<250 copies / mL). A negative result must be combined with clinical  observations, Melissa [name] history, and epidemiological information. If result is POSITIVE SARS-CoV-2 target nucleic acids are DETECTED. Melissa SARS-CoV-2 RNA is generally detectable in upper and lower  respiratory specimens dur ing Melissa acute phase of infection.  Positive  results are indicative of active infection with SARS-CoV-2.  Clinical  correlation with Melissa [name] history and other diagnostic information is  necessary to determine Melissa [name] infection status.  Positive results do  not rule out bacterial infection or co-infection with other viruses. If result is PRESUMPTIVE POSTIVE SARS-CoV-2 nucleic acids MAY BE PRESENT.   A presumptive positive result was obtained on Melissa submitted specimen  and confirmed on repeat testing.  While 2019 novel coronavirus  (  SARS-CoV-2) nucleic acids may be present in Melissa submitted sample  additional confirmatory testing may be necessary for epidemiological  and / or clinical management purposes  to differentiate between  SARS-CoV-2 and other Sarbecovirus currently known to infect humans.  If clinically indicated additional testing with an alternate test  methodology [phone]) is advised. Melissa SARS-CoV-2 RNA is generally  detectable  in upper and lower respiratory sp ecimens during Melissa acute  phase of infection. Melissa expected result is Negative. Fact Sheet for Patients:  StrictlyIdeas.no Fact Sheet for Healthcare Providers: BankingDealers.co.za Melissa test is not yet approved or cleared by Melissa [place] FDA and has been authorized for detection and/or diagnosis of SARS-CoV-2 by FDA under an Emergency Use Authorization (EUA).  Melissa EUA will remain in effect (meaning Melissa test can be used) for Melissa duration of Melissa COVID-19 declaration under Section 564(b)(1) of Melissa Act, 21 U.S.C. section 360bbb-3(b)(1), unless Melissa authorization is terminated or revoked sooner. Performed at [facility], [address] [place], [place] [address]     RADIOLOGY:  No results found.  Follow up with PCP in 1 week.  Management plans discussed with Melissa [name], family and they are in agreement.  CODE STATUS:  Code Status History    Date Active Date Inactive Code Status Order ID Comments User Context   10/29/2018 2105 10/31/2018 1747 DNR [id number]  Hillary Bow, MD Inpatient   10/29/2018 1641 10/29/2018 2105 Full Code [id number]  Melissa Snow, NP ED   01/01/2016 2247 01/05/2016 2040 Full Code [id number]  Harvie Bridge, DO Inpatient   01/01/2016 2222 01/01/2016 2247 Full Code [id number]  Hooten, Laurice Record, MD ED   Advance Care Planning Activity    Questions for Most Recent Historical Code Status (Order [id number])    Question Answer Comment   In Melissa event of cardiac or respiratory ARREST Do not call a "code blue"    In Melissa event of cardiac or respiratory ARREST Do not perform Intubation, CPR, defibrillation or ACLS    In Melissa event of cardiac or respiratory ARREST Use medication by any route, position, wound care, and other measures to relive pain and suffering. May use oxygen, suction and manual treatment of airway obstruction as needed for comfort.         Advance  Directive Documentation     Most Recent Value  Type of Advance Directive  Living will  Pre-existing out of facility DNR order (yellow form or pink MOST form)  -  "MOST" Form in Place?  -      TOTAL TIME TAKING CARE OF Melissa [name] ON DAY OF DISCHARGE: more than 30 minutes.   Leia Alf Tsuyako Jolley M.D on 11/08/2018 at 5:30 PM  Between 7am to 6pm - Pager - [phone]  After 6pm go to [email or web address] - password [email or web address] [facility]  [facility]  Office  [phone]  CC: Primary care physician; Melissa Hector, MD  Note: Melissa dictation was prepared with Dragon dictation along with smaller phrase technology. Any transcriptional errors that result from Melissa process are unintentional.

## 2018-11-09 ENCOUNTER — Ambulatory Visit: Payer: Medicare Other | Admitting: Oncology

## 2018-11-09 ENCOUNTER — Other Ambulatory Visit: Payer: Self-pay

## 2018-11-09 ENCOUNTER — Ambulatory Visit
Admission: RE | Admit: 2018-11-09 | Discharge: 2018-11-09 | Disposition: A | Discharge status: Home / Self Care | Payer: Medicare Other | Source: Ambulatory Visit | Attending: Oncology | Admitting: Oncology

## 2018-11-09 DIAGNOSIS — J320 Chronic maxillary sinusitis: Secondary | ICD-10-CM | POA: Insufficient documentation

## 2018-11-09 DIAGNOSIS — K8020 Calculus of gallbladder without cholecystitis without obstruction: Secondary | ICD-10-CM | POA: Insufficient documentation

## 2018-11-09 DIAGNOSIS — I700 Atherosclerosis of aorta: Secondary | ICD-10-CM | POA: Insufficient documentation

## 2018-11-09 DIAGNOSIS — M899 Disorder of bone, unspecified: Secondary | ICD-10-CM | POA: Insufficient documentation

## 2018-11-09 DIAGNOSIS — D490 Neoplasm of unspecified behavior of digestive system: Secondary | ICD-10-CM | POA: Diagnosis not present

## 2018-11-09 LAB — GLUCOSE, CAPILLARY: Glucose-Capillary: 131 mg/dL — ABNORMAL HIGH (ref 70–99)

## 2018-11-09 MED ORDER — FLUDEOXYGLUCOSE F - 18 (FDG) INJECTION
7.7000 | Freq: Once | INTRAVENOUS | Status: AC | PRN
  Administered 2018-11-09: 10:00:00 8.39 via INTRAVENOUS

## 2018-11-10 ENCOUNTER — Encounter: Payer: Self-pay | Admitting: Oncology

## 2018-11-10 ENCOUNTER — Other Ambulatory Visit: Payer: Self-pay

## 2018-11-10 ENCOUNTER — Inpatient Hospital Stay (HOSPITAL_BASED_OUTPATIENT_CLINIC_OR_DEPARTMENT_OTHER): Payer: Medicare Other | Admitting: Oncology

## 2018-11-10 VITALS — BP 129/70 | HR 83 | Temp 96.7°F | Wt 148.0 lb

## 2018-11-10 DIAGNOSIS — Z7189 Other specified counseling: Secondary | ICD-10-CM | POA: Diagnosis not present

## 2018-11-10 DIAGNOSIS — R197 Diarrhea, unspecified: Secondary | ICD-10-CM | POA: Diagnosis not present

## 2018-11-10 DIAGNOSIS — C801 Malignant (primary) neoplasm, unspecified: Secondary | ICD-10-CM

## 2018-11-10 DIAGNOSIS — R160 Hepatomegaly, not elsewhere classified: Secondary | ICD-10-CM | POA: Diagnosis not present

## 2018-11-10 DIAGNOSIS — K831 Obstruction of bile duct: Secondary | ICD-10-CM

## 2018-11-10 DIAGNOSIS — R630 Anorexia: Secondary | ICD-10-CM | POA: Diagnosis not present

## 2018-11-10 DIAGNOSIS — K8689 Other specified diseases of pancreas: Secondary | ICD-10-CM

## 2018-11-10 DIAGNOSIS — Z66 Do not resuscitate: Secondary | ICD-10-CM | POA: Diagnosis not present

## 2018-11-10 DIAGNOSIS — R634 Abnormal weight loss: Secondary | ICD-10-CM | POA: Diagnosis not present

## 2018-11-10 NOTE — H&P (View-Only) (Signed)
Hematology/Oncology Consult note [facility] Telephone:([phone] [phone]   Patient Care Team: Adin Hector, MD as PCP - General (Internal Medicine) Clent Jacks, RN as Oncology Nurse Navigator  REFERRING PROVIDER: Adin Hector, MD  CHIEF COMPLAINTS/REASON FOR VISIT:  Follow-up for pancreatic mass.  HISTORY OF PRESENTING ILLNESS:   Melissa [name] is a  78 y.o.  female with PMH listed below was seen in consultation at the request of  Adin Hector, MD  for evaluation of abnormal pancreatic disease. Patient was recently seen by primary care provider Dr. Caryl Comes for evaluation of nausea, diarrhea and jaundice.  Blood work on 10/19/2018 showed potassium 2.9, bilirubin 15, alkaline phosphatase 478, AST 114, ALT 95. Abdomen pelvis CT scan on 10/20/2018 showed moderate to market intra-and extra hepatic biliary dilation with mild diffuse dilatation of the pancreatic duct. Patient has had poor appetite and has lost a 5 to 10 pounds recently. She also had diarrhea.  She noticed sand like stool for 1- 2 weeks.  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  She placed her mother to assisted living yesterday. She has had discussion with Dr. Caryl Comes about her blood work and CT scans.  She understands that there is a strong suspicion of cancer.  Nonessential medication has been stopped. CA 19-9 was check on 10/21/2018, level was elevated at 1500.  Today she denies any pain.  Itchy all over her body.. Per patient's request, patient's daughter [name] was [name] and [name] was able to hear entire clinical encounter conversation and participate in the reported history and discussion.   INTERVAL HISTORY Melissa [name] is a 78 y.o. female who has above history reviewed by me today presents for follow up visit for management of pancreatic mass,  posthospitalization follow-up. Problems and complaints are listed below: Patient was sent by me to be admitted secondary to obstructive jaundice, pancreatic mass consistent with pancreatic cancer. Patient was admitted from 10/29/2018-10/31/2018.  Status post ERCP and biliary stenting.  Patient had duodenum ampulla biopsy showed nondiagnostic.  Negative for invasive carcinoma. Bilirubin trended down to 11 at the time of discharge on 10/31/2018. Patient then had outpatient PET scan done for further staging. She present to discuss pathology report, image results and further management plan. Her mother recently passed away. She feels still fatigued and tired.  Skin itchiness have significantly improved.  Jaundice have improved. Denies any pain today.  She reports that within 1 mile of her home, she has no to other patients who were recently diagnosed with pancreatic cancer.  Review of Systems  Constitutional: Positive for appetite change, fatigue and unexpected weight change. Negative for chills and fever.  HENT:   Negative for hearing loss and voice change.   Eyes: Negative for eye problems.  Respiratory: Negative for chest tightness and cough.   Cardiovascular: Negative for chest pain.  Gastrointestinal: Negative for abdominal distention, abdominal pain and blood in stool.  Endocrine: Negative for hot flashes.  Genitourinary: Negative for difficulty urinating and frequency.   Musculoskeletal: Negative for arthralgias.  Skin: Negative for itching and rash.       Jaundice has improved.  Neurological: Negative for extremity weakness.  Hematological: Negative for adenopathy.  Psychiatric/Behavioral: Negative for confusion.    MEDICAL HISTORY:  Past Medical History:  Diagnosis Date   Glaucoma    Hypertension  Osteopenia    Stroke [facility])     SURGICAL HISTORY: Past Surgical History:  Procedure Laterality Date   BREAST BIOPSY Right    core- stereo- neg   ERCP N/A 10/29/2018    Procedure: ENDOSCOPIC RETROGRADE CHOLANGIOPANCREATOGRAPHY (ERCP);  Surgeon: [name], MD;  Location: [facility] ENDOSCOPY;  Service: Endoscopy;  Laterality: N/A;   HIP ARTHROPLASTY Left 01/02/2016   Procedure: ARTHROPLASTY BIPOLAR HIP (HEMIARTHROPLASTY);  Surgeon: Dereck Leep, MD;  Location: [facility] ORS;  Service: Orthopedics;  Laterality: Left;   TONSILLECTOMY      SOCIAL HISTORY: Social History   Socioeconomic History   Marital status: Married    Spouse name: Not on file   Number of children: Not on file   Years of education: Not on file   Highest education level: Not on file  Occupational History   Not on file  Social Needs   Financial resource strain: Not on file   Food insecurity    Worry: Not on file    Inability: Not on file   Transportation needs    Medical: Not on file    Non-medical: Not on file  Tobacco Use   Smoking status: Never Smoker   Smokeless tobacco: Never Used  Substance and Sexual Activity   Alcohol use: No   Drug use: Never   Sexual activity: Not on file  Lifestyle   Physical activity    Days per week: Not on file    Minutes per session: Not on file   Stress: Not on file  Relationships   Social connections    Talks on phone: Not on file    Gets together: Not on file    Attends religious service: Not on file    Active member of club or organization: Not on file    Attends meetings of clubs or organizations: Not on file    Relationship status: Not on file   Intimate partner violence    Fear of current or ex partner: Not on file    Emotionally abused: Not on file    Physically abused: Not on file    Forced sexual activity: Not on file  Other Topics Concern   Not on file  Social History Narrative   Not on file    FAMILY HISTORY: Family History  Problem Relation Age of Onset   Kidney disease Father    Breast cancer Neg Hx     ALLERGIES:  has No Known Allergies.  MEDICATIONS:  Current Outpatient Medications    Medication Sig Dispense Refill   acetaminophen (TYLENOL) 500 MG tablet Take 500 mg by mouth every 6 (six) hours as needed.     amLODipine (NORVASC) 10 MG tablet Take 10 mg by mouth daily.     hydrOXYzine (ATARAX/VISTARIL) 25 MG tablet Take 1 tablet (25 mg total) by mouth every 6 (six) hours as needed for itching. 15 tablet 0   potassium chloride (K-DUR) 10 MEQ tablet Take 10 mEq by mouth 2 (two) times daily.     traZODone (DESYREL) 50 MG tablet Take 1 tablet (50 mg total) by mouth at bedtime as needed for sleep. 30 tablet 0   No current facility-administered medications for this visit.      PHYSICAL EXAMINATION: ECOG PERFORMANCE STATUS: 1 - Symptomatic but completely ambulatory Vitals:   11/10/18 1435  BP: 129/70  Pulse: 83  Temp: (!) 96.7 F (35.9 C)   Filed Weights   11/10/18 1435  Weight: 148 lb (67.1 kg)    Physical Exam  Constitutional:      General: She is not in acute distress. HENT:     Head: Normocephalic and atraumatic.  Eyes:     General: No scleral icterus.    Pupils: Pupils are equal, round, and reactive to light.  Neck:     Musculoskeletal: Normal range of motion and neck supple.  Cardiovascular:     Rate and Rhythm: Normal rate and regular rhythm.     Heart sounds: Normal heart sounds.  Pulmonary:     Effort: Pulmonary effort is normal. No respiratory distress.     Breath sounds: Normal breath sounds. No wheezing.  Abdominal:     General: Bowel sounds are normal. There is no distension.     Palpations: Abdomen is soft. There is no mass.     Tenderness: There is no abdominal tenderness.  Musculoskeletal: Normal range of motion.        General: No deformity.  Skin:    General: Skin is warm and dry.     Coloration: Skin is jaundiced.     Findings: No erythema or rash.  Neurological:     Mental Status: She is alert and oriented to person, place, and time.     Cranial Nerves: No cranial nerve deficit.     Coordination: Coordination normal.   Psychiatric:        Behavior: Behavior normal.        Thought Content: Thought content normal.     LABORATORY DATA:  I have reviewed the data as listed Lab Results  Component Value Date   WBC 8.4 10/30/2018   HGB 10.4 (L) 10/30/2018   HCT 30.5 (L) 10/30/2018   MCV 93.8 10/30/2018   PLT 318 10/30/2018   Recent Labs    10/29/18 1318 10/30/18 0531 10/31/18 0543  NA 135 138 137  K 3.0* 3.2* 3.3*  CL 104 110 109  CO2 21* 20* 22  GLUCOSE 110* 114* 114*  BUN 13 14 11   CREATININE 0.32* UNABLE TO REPORT DUE TO ICTERUS 0.33*  CALCIUM 9.0 8.0* 8.1*  GFRNONAA >60 NOT CALCULATED >60  GFRAA >60 NOT CALCULATED >60  PROT 7.0 5.9* 5.8*  ALBUMIN 2.6* 2.0* 2.0*  AST 119* 91* 88*  ALT 79* UNABLE TO REPORT DUE TO ICTERUS 55*  ALKPHOS 399* 344* 328*  BILITOT 18.2* 14.7* 11.0*  BILIDIR 10.2*  --   --    Iron/TIBC/Ferritin/ %Sat No results found for: IRON, TIBC, FERRITIN, IRONPCTSAT    RADIOGRAPHIC STUDIES: I have personally reviewed the radiological images as listed and agreed with the findings in the report.  Ct Abdomen Pelvis W Contrast  Result Date: 10/20/2018 CLINICAL DATA:  Jaundice EXAM: CT ABDOMEN AND PELVIS WITH CONTRAST TECHNIQUE: Multidetector CT imaging of the abdomen and pelvis was performed using the standard protocol following bolus administration of intravenous contrast. CONTRAST:  177mL OMNIPAQUE IOHEXOL 300 MG/ML  SOLN COMPARISON:  None. FINDINGS: Lower chest: Lung bases demonstrate no acute consolidation or effusion. The heart size is within normal limits. Small hiatal hernia. Hepatobiliary: Subcentimeter hypodensity left hepatic lobe, too small to further characterize but probably a small cyst. Moderate intra hepatic biliary dilatation. Enlarged extrahepatic common bile duct, measuring up to 16 mm in diameter on coronal views. Distended gallbladder with numerous calcified stones. There may be slight gallbladder wall thickening or edema. Pancreas: Mild diffuse enlargement  of the pancreatic head which appears somewhat indistinct. Mild hazy edema around the pancreatic head. Suspect indistinct mass at the head of pancreas. Mild pancreatic ductal dilatation.  11 mm indeterminate cystic lesion distal body/tail of pancreas, series 2, image number 27. Spleen: Normal in size without focal abnormality. Adrenals/Urinary Tract: Adrenal glands are unremarkable. Kidneys are normal, without renal calculi, focal lesion, or hydronephrosis. Bladder is unremarkable. Stomach/Bowel: Stomach is within normal limits. Appendix appears normal. No evidence of bowel wall thickening, distention, or inflammatory changes. Vascular/Lymphatic: Nonaneurysmal aorta. Mild aortic atherosclerosis. Multiple enlarged peripancreatic and porta hepatis lymph nodes measuring up to 2.2 cm, coronal series 5, image number 34. Enlarged retroperitoneal/Peri aortic lymph nodes, measuring up to 2.2 cm. Central low density within the periaortic nodes, possibly due to necrosis. Reproductive: Uterus and bilateral adnexa are unremarkable. Other: Negative for free air or free fluid. Small fat in the umbilical region. Musculoskeletal: No acute or significant osseous findings. IMPRESSION: 1. Moderate to marked intra and extrahepatic biliary dilatation with mild diffuse dilatation of the pancreatic duct. Diffuse enlargement and indistinct appearance of the pancreatic head, constellation of findings is suspect for biliary obstruction secondary to malignant stricture/possible pancreatic head mass. Additional finding of small cystic lesions within the distal body of the pancreas which could be more thoroughly evaluated with MRI 2. Multiple calcified gallstones. Gallbladder slightly enlarged and there may be slight gallbladder wall edema or thickening 3. Multiple enlarged porta hepatis, peripancreatic, and retroperitoneal nodes, raising concern for metastatic disease. Electronically Signed   By: Donavan Foil M.D.   On: 10/20/2018 18:41   Nm  Pet Image Initial (pi) Skull Base To Thigh  Result Date: 11/09/2018 CLINICAL DATA:  Initial treatment strategy for pancreatic mass. EXAM: NUCLEAR MEDICINE PET SKULL BASE TO THIGH TECHNIQUE: 8.4 mCi F-18 FDG was injected intravenously. Full-ring PET imaging was performed from the skull base to thigh after the radiotracer. CT data was obtained and used for attenuation correction and anatomic localization. Fasting blood glucose: 131 mg/dl COMPARISON:  CT abdomen 10/20/2018 FINDINGS: Mediastinal blood pool activity: SUV max 2.3 Liver activity: SUV max 3.5 NECK: No significant abnormal hypermetabolic activity in this region. Incidental CT findings: Chronic right maxillary sinusitis. CHEST: No significant abnormal hypermetabolic activity in this region. Incidental CT findings: Atherosclerotic calcification of the aortic arch and branch vessels. ABDOMEN/PELVIS: Abnormal accentuated activity in the pancreatic head, maximum SUV 8.7, high suspicion for pancreatic head malignancy. The biliary stent passes through this region of abnormal accentuated metabolic activity. Hypermetabolic adjacent peripancreatic retroperitoneal lymph nodes. For example, a 1.3 cm peripancreatic lymph node a left periaortic lymph node measuring 1.4 cm in short axis on image 148/3 has a maximum SUV of 5.2. Other hypermetabolic porta hepatis and retroperitoneal adenopathy is also present. No hypermetabolic hepatic mass is observed. Incidental CT findings: Moderately distended gallbladder containing numerous small gallstones. Trace amount of perihepatic fluid. Mild stranding around the pancreatic head and porta hepatis region and extending into the root of the mesentery. Mild hypodensity along the pancreatic tail on image 142/3 corresponding to a small fluid density lesion shown on prior CT scan in this vicinity. SKELETON: Mild accentuated activity along the left upper sacral ala and adjacent iliac bone. The sacral ala has a maximum SUV of 4.8 with the  iliac bone having a maximum SUV of 4.7. Very faint sclerosis in the medullary space of the right proximal femur associated with accentuated metabolic activity, maximum SUV 4.0, this lesion measured 1.1 cm in diameter on image 246/3. Incidental CT findings: Left hip hemiarthroplasty. Lower lumbar degenerative facet arthropathy. Thoracic spondylosis. IMPRESSION: 1. Abnormal prominence of the pancreatic head with indistinctness of associated tissue planes and abnormal hypermetabolic activity with  maximum SUV up to 8.7, compatible with malignancy. Pancreatic adenocarcinoma with certainly be a top differential diagnostic consideration. 2. Surrounding peripancreatic, porta hepatis, and retroperitoneal lymph nodes are hypermetabolic indicating malignant involvement. 3. Is a faintly sclerotic 1.1 cm lesion in the right proximal femur with mild hypermetabolic activity, maximum SUV 4.0. Uncertain significance, but the possibility of osseous metastatic disease is raised. There are also indistinctly marginated areas of accentuated metabolic activity in the left sacral ala and adjacent iliac bone which could be inflammatory from mild sacroiliitis or due to malignancy, but these lesions do not have CT correlate. 4. Distended and mildly thick-walled gallbladder with numerous gallstones. A biliary stent is in place. Strictly speaking I cannot exclude acute cholecystitis. 5. Other imaging findings of potential clinical significance: Chronic right maxillary sinusitis. Aortic Atherosclerosis (ICD10-I70.0). Electronically Signed   By: Van Clines M.D.   On: 11/09/2018 13:22   Dg C-arm 1-60 Min-no Report  Result Date: 10/29/2018 Fluoroscopy was utilized by the requesting physician.  No radiographic interpretation.   US Abdomen Limited Ruq  Result Date: 10/29/2018 CLINICAL DATA:  Worsening jaundice. EXAM: ULTRASOUND ABDOMEN LIMITED RIGHT UPPER QUADRANT COMPARISON:  CT scan of October 20, 2018. FINDINGS: Gallbladder: Mild  gallbladder distention is noted. Cholelithiasis is noted with mild gallbladder wall thickening and pericholecystic fluid. Some degree of sludge is present as well. No sonographic Murphy's sign is noted. Common bile duct: Diameter: Measures 17 mm proximally and 13 mm distally, consistent with distal common bile duct obstruction. Liver: No focal lesion identified. Within normal limits in parenchymal echogenicity. Intrahepatic biliary dilatation is noted. Portal vein is patent on color Doppler imaging with normal direction of blood flow towards the liver. Other: None. IMPRESSION: Cholelithiasis is noted with mild gallbladder wall thickening and pericholecystic fluid, and some degree of sludge within gallbladder lumen. Mild gallbladder distention is noted. Cholecystitis cannot be excluded and clinical correlation is recommended. Significant intrahepatic and extrahepatic biliary dilatation is noted concerning for distal common bile duct obstruction, potentially due to pancreatic neoplasm as described on prior CT scan. Electronically Signed   By: Marijo Conception M.D.   On: 10/29/2018 12:55      ASSESSMENT & PLAN:  1. Pancreatic mass   2. Obstructive jaundice due to malignant neoplasm (HCC)   3. Dilation of pancreatic duct   4. Goals of care, counseling/discussion   . Obstructive jaundice status post ERCP and biliary stenting.  Clinically doing better.  Bilirubin has trended down at time of discharge.  Continue monitor.  Will check LFT at the next visit.  Pancreatic mass, ERCP duodenal ampulla biopsy was nondiagnostic. Will refer patient to EUS biopsy to establish tissue diagnosis. PET scan images were independent reviewed by me and discussed with patient. 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 Goal of care was discussed. She understands that Her condition is most likely not curable.  Prognosis is poor.  Awaiting endoscopy ultrasound biopsy to  establish tissue diagnosis. She is interested in chemotherapy.  We briefly discussed about potential chemotherapy treatments for her condition today.  We will discuss more at next visit. We will schedule a virtual visit after [place] for further discussion.  All questions were answered. The patient knows to call the clinic with any problems questions or concerns.  cc Adin Hector, MD   Return of visit: After [place] to discuss the results We spent sufficient time to discuss many aspect of care, questions were answered to patient's satisfaction. Total  face to face encounter time for this patient visit was 25 min. >50% of the time was  spent in counseling and coordination of care.    Earlie Server, MD, PhD Hematology Oncology [facility] at [facility] Pager- [phone] 11/10/2018

## 2018-11-10 NOTE — Progress Notes (Signed)
Pt here for follow up, reports no acute concerns today.

## 2018-11-10 NOTE — Progress Notes (Signed)
Hematology/Oncology Consult note [facility] Telephone:([phone] [phone]   Patient Care Team: Adin Hector, MD as PCP - General (Internal Medicine) Clent Jacks, RN as Oncology Nurse Navigator  REFERRING PROVIDER: Adin Hector, MD  CHIEF COMPLAINTS/REASON FOR VISIT:  Follow-up for pancreatic mass.  HISTORY OF PRESENTING ILLNESS:   Melissa [name] is a  78 y.o.  female with PMH listed below was seen in consultation at the request of  Adin Hector, MD  for evaluation of abnormal pancreatic disease. Patient was recently seen by primary care provider Dr. Caryl Comes for evaluation of nausea, diarrhea and jaundice.  Blood work on 10/19/2018 showed potassium 2.9, bilirubin 15, alkaline phosphatase 478, AST 114, ALT 95. Abdomen pelvis CT scan on 10/20/2018 showed moderate to market intra-and extra hepatic biliary dilation with mild diffuse dilatation of the pancreatic duct. Patient has had poor appetite and has lost a 5 to 10 pounds recently. She also had diarrhea.  She noticed sand like stool for 1- 2 weeks.  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  She placed her mother to assisted living yesterday. She has had discussion with Dr. Caryl Comes about her blood work and CT scans.  She understands that there is a strong suspicion of cancer.  Nonessential medication has been stopped. CA 19-9 was check on 10/21/2018, level was elevated at 1500.  Today she denies any pain.  Itchy all over her body.. Per patient's request, patient's daughter [name] was [name] and [name] was able to hear entire clinical encounter conversation and participate in the reported history and discussion.   INTERVAL HISTORY Melissa [name] is a 78 y.o. female who has above history reviewed by me today presents for follow up visit for management of pancreatic mass,  posthospitalization follow-up. Problems and complaints are listed below: Patient was sent by me to be admitted secondary to obstructive jaundice, pancreatic mass consistent with pancreatic cancer. Patient was admitted from 10/29/2018-10/31/2018.  Status post ERCP and biliary stenting.  Patient had duodenum ampulla biopsy showed nondiagnostic.  Negative for invasive carcinoma. Bilirubin trended down to 11 at the time of discharge on 10/31/2018. Patient then had outpatient PET scan done for further staging. She present to discuss pathology report, image results and further management plan. Her mother recently passed away. She feels still fatigued and tired.  Skin itchiness have significantly improved.  Jaundice have improved. Denies any pain today.  She reports that within 1 mile of her home, she has no to other patients who were recently diagnosed with pancreatic cancer.  Review of Systems  Constitutional: Positive for appetite change, fatigue and unexpected weight change. Negative for chills and fever.  HENT:   Negative for hearing loss and voice change.   Eyes: Negative for eye problems.  Respiratory: Negative for chest tightness and cough.   Cardiovascular: Negative for chest pain.  Gastrointestinal: Negative for abdominal distention, abdominal pain and blood in stool.  Endocrine: Negative for hot flashes.  Genitourinary: Negative for difficulty urinating and frequency.   Musculoskeletal: Negative for arthralgias.  Skin: Negative for itching and rash.       Jaundice has improved.  Neurological: Negative for extremity weakness.  Hematological: Negative for adenopathy.  Psychiatric/Behavioral: Negative for confusion.    MEDICAL HISTORY:  Past Medical History:  Diagnosis Date   Glaucoma    Hypertension  Osteopenia    Stroke [facility])     SURGICAL HISTORY: Past Surgical History:  Procedure Laterality Date   BREAST BIOPSY Right    core- stereo- neg   ERCP N/A 10/29/2018    Procedure: ENDOSCOPIC RETROGRADE CHOLANGIOPANCREATOGRAPHY (ERCP);  Surgeon: [name], MD;  Location: [facility] ENDOSCOPY;  Service: Endoscopy;  Laterality: N/A;   HIP ARTHROPLASTY Left 01/02/2016   Procedure: ARTHROPLASTY BIPOLAR HIP (HEMIARTHROPLASTY);  Surgeon: Dereck Leep, MD;  Location: [facility] ORS;  Service: Orthopedics;  Laterality: Left;   TONSILLECTOMY      SOCIAL HISTORY: Social History   Socioeconomic History   Marital status: Married    Spouse name: Not on file   Number of children: Not on file   Years of education: Not on file   Highest education level: Not on file  Occupational History   Not on file  Social Needs   Financial resource strain: Not on file   Food insecurity    Worry: Not on file    Inability: Not on file   Transportation needs    Medical: Not on file    Non-medical: Not on file  Tobacco Use   Smoking status: Never Smoker   Smokeless tobacco: Never Used  Substance and Sexual Activity   Alcohol use: No   Drug use: Never   Sexual activity: Not on file  Lifestyle   Physical activity    Days per week: Not on file    Minutes per session: Not on file   Stress: Not on file  Relationships   Social connections    Talks on phone: Not on file    Gets together: Not on file    Attends religious service: Not on file    Active member of club or organization: Not on file    Attends meetings of clubs or organizations: Not on file    Relationship status: Not on file   Intimate partner violence    Fear of current or ex partner: Not on file    Emotionally abused: Not on file    Physically abused: Not on file    Forced sexual activity: Not on file  Other Topics Concern   Not on file  Social History Narrative   Not on file    FAMILY HISTORY: Family History  Problem Relation Age of Onset   Kidney disease Father    Breast cancer Neg Hx     ALLERGIES:  has No Known Allergies.  MEDICATIONS:  Current Outpatient Medications    Medication Sig Dispense Refill   acetaminophen (TYLENOL) 500 MG tablet Take 500 mg by mouth every 6 (six) hours as needed.     amLODipine (NORVASC) 10 MG tablet Take 10 mg by mouth daily.     hydrOXYzine (ATARAX/VISTARIL) 25 MG tablet Take 1 tablet (25 mg total) by mouth every 6 (six) hours as needed for itching. 15 tablet 0   potassium chloride (K-DUR) 10 MEQ tablet Take 10 mEq by mouth 2 (two) times daily.     traZODone (DESYREL) 50 MG tablet Take 1 tablet (50 mg total) by mouth at bedtime as needed for sleep. 30 tablet 0   No current facility-administered medications for this visit.      PHYSICAL EXAMINATION: ECOG PERFORMANCE STATUS: 1 - Symptomatic but completely ambulatory Vitals:   11/10/18 1435  BP: 129/70  Pulse: 83  Temp: (!) 96.7 F (35.9 C)   Filed Weights   11/10/18 1435  Weight: 148 lb (67.1 kg)    Physical Exam  Constitutional:      General: She is not in acute distress. HENT:     Head: Normocephalic and atraumatic.  Eyes:     General: No scleral icterus.    Pupils: Pupils are equal, round, and reactive to light.  Neck:     Musculoskeletal: Normal range of motion and neck supple.  Cardiovascular:     Rate and Rhythm: Normal rate and regular rhythm.     Heart sounds: Normal heart sounds.  Pulmonary:     Effort: Pulmonary effort is normal. No respiratory distress.     Breath sounds: Normal breath sounds. No wheezing.  Abdominal:     General: Bowel sounds are normal. There is no distension.     Palpations: Abdomen is soft. There is no mass.     Tenderness: There is no abdominal tenderness.  Musculoskeletal: Normal range of motion.        General: No deformity.  Skin:    General: Skin is warm and dry.     Coloration: Skin is jaundiced.     Findings: No erythema or rash.  Neurological:     Mental Status: She is alert and oriented to person, place, and time.     Cranial Nerves: No cranial nerve deficit.     Coordination: Coordination normal.   Psychiatric:        Behavior: Behavior normal.        Thought Content: Thought content normal.     LABORATORY DATA:  I have reviewed the data as listed Lab Results  Component Value Date   WBC 8.4 10/30/2018   HGB 10.4 (L) 10/30/2018   HCT 30.5 (L) 10/30/2018   MCV 93.8 10/30/2018   PLT 318 10/30/2018   Recent Labs    10/29/18 1318 10/30/18 0531 10/31/18 0543  NA 135 138 137  K 3.0* 3.2* 3.3*  CL 104 110 109  CO2 21* 20* 22  GLUCOSE 110* 114* 114*  BUN 13 14 11   CREATININE 0.32* UNABLE TO REPORT DUE TO ICTERUS 0.33*  CALCIUM 9.0 8.0* 8.1*  GFRNONAA >60 NOT CALCULATED >60  GFRAA >60 NOT CALCULATED >60  PROT 7.0 5.9* 5.8*  ALBUMIN 2.6* 2.0* 2.0*  AST 119* 91* 88*  ALT 79* UNABLE TO REPORT DUE TO ICTERUS 55*  ALKPHOS 399* 344* 328*  BILITOT 18.2* 14.7* 11.0*  BILIDIR 10.2*  --   --    Iron/TIBC/Ferritin/ %Sat No results found for: IRON, TIBC, FERRITIN, IRONPCTSAT    RADIOGRAPHIC STUDIES: I have personally reviewed the radiological images as listed and agreed with the findings in the report.  Ct Abdomen Pelvis W Contrast  Result Date: 10/20/2018 CLINICAL DATA:  Jaundice EXAM: CT ABDOMEN AND PELVIS WITH CONTRAST TECHNIQUE: Multidetector CT imaging of the abdomen and pelvis was performed using the standard protocol following bolus administration of intravenous contrast. CONTRAST:  163mL OMNIPAQUE IOHEXOL 300 MG/ML  SOLN COMPARISON:  None. FINDINGS: Lower chest: Lung bases demonstrate no acute consolidation or effusion. The heart size is within normal limits. Small hiatal hernia. Hepatobiliary: Subcentimeter hypodensity left hepatic lobe, too small to further characterize but probably a small cyst. Moderate intra hepatic biliary dilatation. Enlarged extrahepatic common bile duct, measuring up to 16 mm in diameter on coronal views. Distended gallbladder with numerous calcified stones. There may be slight gallbladder wall thickening or edema. Pancreas: Mild diffuse enlargement  of the pancreatic head which appears somewhat indistinct. Mild hazy edema around the pancreatic head. Suspect indistinct mass at the head of pancreas. Mild pancreatic ductal dilatation.  11 mm indeterminate cystic lesion distal body/tail of pancreas, series 2, image number 27. Spleen: Normal in size without focal abnormality. Adrenals/Urinary Tract: Adrenal glands are unremarkable. Kidneys are normal, without renal calculi, focal lesion, or hydronephrosis. Bladder is unremarkable. Stomach/Bowel: Stomach is within normal limits. Appendix appears normal. No evidence of bowel wall thickening, distention, or inflammatory changes. Vascular/Lymphatic: Nonaneurysmal aorta. Mild aortic atherosclerosis. Multiple enlarged peripancreatic and porta hepatis lymph nodes measuring up to 2.2 cm, coronal series 5, image number 34. Enlarged retroperitoneal/Peri aortic lymph nodes, measuring up to 2.2 cm. Central low density within the periaortic nodes, possibly due to necrosis. Reproductive: Uterus and bilateral adnexa are unremarkable. Other: Negative for free air or free fluid. Small fat in the umbilical region. Musculoskeletal: No acute or significant osseous findings. IMPRESSION: 1. Moderate to marked intra and extrahepatic biliary dilatation with mild diffuse dilatation of the pancreatic duct. Diffuse enlargement and indistinct appearance of the pancreatic head, constellation of findings is suspect for biliary obstruction secondary to malignant stricture/possible pancreatic head mass. Additional finding of small cystic lesions within the distal body of the pancreas which could be more thoroughly evaluated with MRI 2. Multiple calcified gallstones. Gallbladder slightly enlarged and there may be slight gallbladder wall edema or thickening 3. Multiple enlarged porta hepatis, peripancreatic, and retroperitoneal nodes, raising concern for metastatic disease. Electronically Signed   By: Donavan Foil M.D.   On: 10/20/2018 18:41   Nm  Pet Image Initial (pi) Skull Base To Thigh  Result Date: 11/09/2018 CLINICAL DATA:  Initial treatment strategy for pancreatic mass. EXAM: NUCLEAR MEDICINE PET SKULL BASE TO THIGH TECHNIQUE: 8.4 mCi F-18 FDG was injected intravenously. Full-ring PET imaging was performed from the skull base to thigh after the radiotracer. CT data was obtained and used for attenuation correction and anatomic localization. Fasting blood glucose: 131 mg/dl COMPARISON:  CT abdomen 10/20/2018 FINDINGS: Mediastinal blood pool activity: SUV max 2.3 Liver activity: SUV max 3.5 NECK: No significant abnormal hypermetabolic activity in this region. Incidental CT findings: Chronic right maxillary sinusitis. CHEST: No significant abnormal hypermetabolic activity in this region. Incidental CT findings: Atherosclerotic calcification of the aortic arch and branch vessels. ABDOMEN/PELVIS: Abnormal accentuated activity in the pancreatic head, maximum SUV 8.7, high suspicion for pancreatic head malignancy. The biliary stent passes through this region of abnormal accentuated metabolic activity. Hypermetabolic adjacent peripancreatic retroperitoneal lymph nodes. For example, a 1.3 cm peripancreatic lymph node a left periaortic lymph node measuring 1.4 cm in short axis on image 148/3 has a maximum SUV of 5.2. Other hypermetabolic porta hepatis and retroperitoneal adenopathy is also present. No hypermetabolic hepatic mass is observed. Incidental CT findings: Moderately distended gallbladder containing numerous small gallstones. Trace amount of perihepatic fluid. Mild stranding around the pancreatic head and porta hepatis region and extending into the root of the mesentery. Mild hypodensity along the pancreatic tail on image 142/3 corresponding to a small fluid density lesion shown on prior CT scan in this vicinity. SKELETON: Mild accentuated activity along the left upper sacral ala and adjacent iliac bone. The sacral ala has a maximum SUV of 4.8 with the  iliac bone having a maximum SUV of 4.7. Very faint sclerosis in the medullary space of the right proximal femur associated with accentuated metabolic activity, maximum SUV 4.0, this lesion measured 1.1 cm in diameter on image 246/3. Incidental CT findings: Left hip hemiarthroplasty. Lower lumbar degenerative facet arthropathy. Thoracic spondylosis. IMPRESSION: 1. Abnormal prominence of the pancreatic head with indistinctness of associated tissue planes and abnormal hypermetabolic activity with  maximum SUV up to 8.7, compatible with malignancy. Pancreatic adenocarcinoma with certainly be a top differential diagnostic consideration. 2. Surrounding peripancreatic, porta hepatis, and retroperitoneal lymph nodes are hypermetabolic indicating malignant involvement. 3. Is a faintly sclerotic 1.1 cm lesion in the right proximal femur with mild hypermetabolic activity, maximum SUV 4.0. Uncertain significance, but the possibility of osseous metastatic disease is raised. There are also indistinctly marginated areas of accentuated metabolic activity in the left sacral ala and adjacent iliac bone which could be inflammatory from mild sacroiliitis or due to malignancy, but these lesions do not have CT correlate. 4. Distended and mildly thick-walled gallbladder with numerous gallstones. A biliary stent is in place. Strictly speaking I cannot exclude acute cholecystitis. 5. Other imaging findings of potential clinical significance: Chronic right maxillary sinusitis. Aortic Atherosclerosis (ICD10-I70.0). Electronically Signed   By: Van Clines M.D.   On: 11/09/2018 13:22   Dg C-arm 1-60 Min-no Report  Result Date: 10/29/2018 Fluoroscopy was utilized by the requesting physician.  No radiographic interpretation.   US Abdomen Limited Ruq  Result Date: 10/29/2018 CLINICAL DATA:  Worsening jaundice. EXAM: ULTRASOUND ABDOMEN LIMITED RIGHT UPPER QUADRANT COMPARISON:  CT scan of October 20, 2018. FINDINGS: Gallbladder: Mild  gallbladder distention is noted. Cholelithiasis is noted with mild gallbladder wall thickening and pericholecystic fluid. Some degree of sludge is present as well. No sonographic Murphy's sign is noted. Common bile duct: Diameter: Measures 17 mm proximally and 13 mm distally, consistent with distal common bile duct obstruction. Liver: No focal lesion identified. Within normal limits in parenchymal echogenicity. Intrahepatic biliary dilatation is noted. Portal vein is patent on color Doppler imaging with normal direction of blood flow towards the liver. Other: None. IMPRESSION: Cholelithiasis is noted with mild gallbladder wall thickening and pericholecystic fluid, and some degree of sludge within gallbladder lumen. Mild gallbladder distention is noted. Cholecystitis cannot be excluded and clinical correlation is recommended. Significant intrahepatic and extrahepatic biliary dilatation is noted concerning for distal common bile duct obstruction, potentially due to pancreatic neoplasm as described on prior CT scan. Electronically Signed   By: Marijo Conception M.D.   On: 10/29/2018 12:55      ASSESSMENT & PLAN:  1. Pancreatic mass   2. Obstructive jaundice due to malignant neoplasm (HCC)   3. Dilation of pancreatic duct   4. Goals of care, counseling/discussion   . Obstructive jaundice status post ERCP and biliary stenting.  Clinically doing better.  Bilirubin has trended down at time of discharge.  Continue monitor.  Will check LFT at the next visit.  Pancreatic mass, ERCP duodenal ampulla biopsy was nondiagnostic. Will refer patient to EUS biopsy to establish tissue diagnosis. PET scan images were independent reviewed by me and discussed with patient. 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 Goal of care was discussed. She understands that Her condition is most likely not curable.  Prognosis is poor.  Awaiting endoscopy ultrasound biopsy to  establish tissue diagnosis. She is interested in chemotherapy.  We briefly discussed about potential chemotherapy treatments for her condition today.  We will discuss more at next visit. We will schedule a virtual visit after [place] for further discussion.  All questions were answered. The patient knows to call the clinic with any problems questions or concerns.  cc Adin Hector, MD   Return of visit: After [place] to discuss the results We spent sufficient time to discuss many aspect of care, questions were answered to patient's satisfaction. Total  face to face encounter time for this patient visit was 25 min. >50% of the time was  spent in counseling and coordination of care.    Earlie Server, MD, PhD Hematology Oncology [facility] Pager- [phone] 11/10/2018

## 2018-11-11 ENCOUNTER — Other Ambulatory Visit: Payer: Self-pay

## 2018-11-11 NOTE — Progress Notes (Signed)
EUS scheduled for 11/18/18 with Dr. Mont Dutton at [facility]. Provided further education regarding EUS. Went over all instructions below and provided print copy of instructions. Denies anticoagulants or diabetes.She will obtain her COVID-19 testing 8/31. Provided verbal and printed instructions regarding.   INSTRUCTIONS FOR ENDOSCOPIC ULTRASOUND -Your procedure has been scheduled for September 3rd at [facility] [place] [facility] may contact you to pre-register over the phone.  -To get your scheduled arrival time, please call the Endoscopy unit at  [phone] between 1-3 p.m. on:  September 2nd     -ON THE DAY OF YOU PROCEDURE:   1. If you are scheduled for a morning procedure, nothing to drink after midnight  -If you are scheduled for an afternoon procedure, you may have clear liquids until 5 hours prior  to the procedure but no carbonated drinks or broth  2. NO FOOD THE DAY OF YOUR PROCEDURE  3. You may take your heart, seizure, blood pressure, Parkinson's or breathing medications at  6am with just enough water to get your pills down  4. Do not take any oral Diabetic medications the morning of your procedure.  5. If you are a diabetic and are using insulin, please notify your prescribing physician of this  procedure, as your dose may need to be altered related to not being able to eat or drink.   6. Do not take vitamins, iron, or fish oil for 5 days before your procedure     -On the day of your procedure, come to the [facility] Admitting/Registration desk (First desk on the right) at the scheduled arrival time. You MUST have someone drive you home from your procedure. You must have a responsible adult with a valid driver's license who is on site throughout your entire procedure and who can stay with you for several hours after your procedure. You may not go home alone in a taxi, shuttle [place] or bus, as the drivers will not be responsible for you.  --If you have any  questions please call me at the above contact    In Preparation of your upcoming procedure you are having testing for Covid-19 on Monday, August 31st at the [facility]. This is a drive-thru testing site. Please come between 10:30 to 12:30.  We are asking that you stay at home and avoid visitors from this point until after your procedure is completed to minimize potential for Covid-19 exposure.    Please Wash your hands frequently with soap and water or clean hands with an alcohol-based hand sanitizer often.  Do not touch your eyes, nose, and mouth, especially with unwashed hands.  Should you at any time develop new symptoms of fever, cough, sneezing or runny nose, sore throat, difficulty breathing, or unexplained body aches, we ask that you contact your provider.   It may take up to 48 hours for results of this testing to be made available.   You will not receive notification if test results are negative.  If test results are positive for Covid-19, your provider or his/her representative will notify you by phone, with additional instructions.

## 2018-11-15 ENCOUNTER — Other Ambulatory Visit
Admission: RE | Admit: 2018-11-15 | Discharge: 2018-11-15 | Disposition: A | Discharge status: Home / Self Care | Payer: Medicare Other | Source: Ambulatory Visit | Attending: Internal Medicine | Admitting: Internal Medicine

## 2018-11-15 ENCOUNTER — Other Ambulatory Visit: Payer: Self-pay

## 2018-11-15 DIAGNOSIS — Z20828 Contact with and (suspected) exposure to other viral communicable diseases: Secondary | ICD-10-CM | POA: Diagnosis not present

## 2018-11-15 DIAGNOSIS — Z01812 Encounter for preprocedural laboratory examination: Secondary | ICD-10-CM | POA: Insufficient documentation

## 2018-11-15 LAB — SARS CORONAVIRUS 2 (TAT 6-24 HRS): SARS Coronavirus 2: NEGATIVE

## 2018-11-16 ENCOUNTER — Encounter: Payer: Self-pay | Admitting: Oncology

## 2018-11-16 DIAGNOSIS — Z7189 Other specified counseling: Secondary | ICD-10-CM | POA: Insufficient documentation

## 2018-11-18 ENCOUNTER — Other Ambulatory Visit: Payer: Self-pay

## 2018-11-18 ENCOUNTER — Ambulatory Visit: Payer: Medicare Other | Admitting: Anesthesiology

## 2018-11-18 ENCOUNTER — Encounter: Payer: Self-pay | Admitting: Anesthesiology

## 2018-11-18 ENCOUNTER — Encounter
Admission: RE | Disposition: A | Discharge status: Home / Self Care | Payer: Self-pay | Source: Home / Self Care | Attending: Internal Medicine

## 2018-11-18 ENCOUNTER — Ambulatory Visit
Admission: RE | Admit: 2018-11-18 | Discharge: 2018-11-18 | Disposition: A | Discharge status: Home / Self Care | Payer: Medicare Other | Attending: Internal Medicine | Admitting: Internal Medicine

## 2018-11-18 DIAGNOSIS — K862 Cyst of pancreas: Secondary | ICD-10-CM | POA: Diagnosis not present

## 2018-11-18 DIAGNOSIS — C7951 Secondary malignant neoplasm of bone: Secondary | ICD-10-CM | POA: Diagnosis not present

## 2018-11-18 DIAGNOSIS — I10 Essential (primary) hypertension: Secondary | ICD-10-CM | POA: Insufficient documentation

## 2018-11-18 DIAGNOSIS — C250 Malignant neoplasm of head of pancreas: Secondary | ICD-10-CM | POA: Diagnosis not present

## 2018-11-18 DIAGNOSIS — Z8673 Personal history of transient ischemic attack (TIA), and cerebral infarction without residual deficits: Secondary | ICD-10-CM | POA: Diagnosis not present

## 2018-11-18 DIAGNOSIS — C772 Secondary and unspecified malignant neoplasm of intra-abdominal lymph nodes: Secondary | ICD-10-CM | POA: Diagnosis not present

## 2018-11-18 DIAGNOSIS — K449 Diaphragmatic hernia without obstruction or gangrene: Secondary | ICD-10-CM | POA: Insufficient documentation

## 2018-11-18 DIAGNOSIS — Z79899 Other long term (current) drug therapy: Secondary | ICD-10-CM | POA: Diagnosis not present

## 2018-11-18 DIAGNOSIS — K831 Obstruction of bile duct: Secondary | ICD-10-CM | POA: Diagnosis not present

## 2018-11-18 DIAGNOSIS — K869 Disease of pancreas, unspecified: Secondary | ICD-10-CM | POA: Diagnosis present

## 2018-11-18 HISTORY — PX: EUS: SHX5427

## 2018-11-18 SURGERY — ULTRASOUND, UPPER GI TRACT, ENDOSCOPIC
Anesthesia: General

## 2018-11-18 MED ORDER — SODIUM CHLORIDE 0.9 % IV SOLN
INTRAVENOUS | Status: DC
  Administered 2018-11-18: 14:00:00 via INTRAVENOUS

## 2018-11-18 MED ORDER — LIDOCAINE HCL (PF) 2 % IJ SOLN
INTRAMUSCULAR | Status: AC
  Filled 2018-11-18: qty 10

## 2018-11-18 MED ORDER — PROPOFOL 500 MG/50ML IV EMUL
INTRAVENOUS | Status: DC | PRN
  Administered 2018-11-18: 120 ug/kg/min via INTRAVENOUS

## 2018-11-18 MED ORDER — PROPOFOL 10 MG/ML IV BOLUS
INTRAVENOUS | Status: DC | PRN
  Administered 2018-11-18: 80 mg via INTRAVENOUS

## 2018-11-18 MED ORDER — PROPOFOL 500 MG/50ML IV EMUL
INTRAVENOUS | Status: AC
  Filled 2018-11-18: qty 50

## 2018-11-18 NOTE — Transfer of Care (Signed)
Immediate Anesthesia Transfer of Care Note  Patient: Melissa [name]  Procedure(s) Performed: FULL UPPER ENDOSCOPIC ULTRASOUND (EUS) RADIAL (N/A )  Patient Location: Endoscopy Unit  Anesthesia Type:General  Level of Consciousness: drowsy and patient cooperative  Airway & Oxygen Therapy: Patient Spontanous Breathing and Patient connected to nasal cannula oxygen  Post-op Assessment: Report given to RN and Post -op Vital signs reviewed and stable  Post vital signs: Reviewed and stable  Last Vitals:  Vitals Value Taken Time  BP 112/67 11/18/18 1429  Temp 36.6 C 11/18/18 1428  Pulse 82 11/18/18 1430  Resp 21 11/18/18 1430  SpO2 100 % 11/18/18 1430  Vitals shown include unvalidated device data.  Last Pain:  Vitals:   11/18/18 1428  TempSrc:   PainSc: 0-No pain         Complications: No apparent anesthesia complications

## 2018-11-18 NOTE — Brief Op Note (Signed)
Fine needle bx of pancreatic head mass obtained to send to lab

## 2018-11-18 NOTE — Discharge Instructions (Signed)
Discharge to home °

## 2018-11-18 NOTE — Anesthesia Post-op Follow-up Note (Signed)
Anesthesia QCDR form completed.        

## 2018-11-18 NOTE — Op Note (Signed)
[facility]astroenterology Patient Name: Melissa [name] Procedure Date: 11/18/2018 1:48 PM MRN: [id number] Account #: [id number] Date of Birth: [birth year] Admit Type: Outpatient Age: 78 Room: [facility] ENDO ROOM 3 Gender: Female Note Status: Finalized Procedure:            Upper EUS Indications:          Suspected mass in pancreas on CT scan, Obstruction of                        bile duct on ERCP s/p stent placement (no brushings                        taken) Patient Profile:      Refer to note in patient chart for documentation of                        history and physical. Providers:            Murray Hodgkins. Josiel Gahm Referring MD:         Ramonita Lab, MD (Referring MD), Earlie Server, MD Medicines:            Propofol per Anesthesia Complications:        No immediate complications. Procedure:            Pre-Anesthesia Assessment:                       Prior to the procedure, a History and Physical was                        performed, and patient medications and allergies were                        reviewed. The patient is competent. The risks and                        benefits of the procedure and the sedation options and                        risks were discussed with the patient. All questions                        were answered and informed consent was obtained.                        Patient identification and proposed procedure were                        verified by the physician, the nurse and the                        anesthetist in the pre-procedure area. Mental Status                        Examination: alert and oriented. Airway Examination:                        normal oropharyngeal airway and neck mobility.                        Respiratory Examination:  clear to auscultation. CV                        Examination: normal. Prophylactic Antibiotics: The                        patient does not require prophylactic antibiotics.   Prior Anticoagulants: The patient has taken no previous                        anticoagulant or antiplatelet agents. ASA Grade                        Assessment: III - A patient with severe systemic                        disease. After reviewing the risks and benefits, the                        patient was deemed in satisfactory condition to undergo                        the procedure. The anesthesia plan was to use monitored                        anesthesia care (MAC). Immediately prior to                        administration of medications, the patient was                        re-assessed for adequacy to receive sedatives. The                        heart rate, respiratory rate, oxygen saturations, blood                        pressure, adequacy of pulmonary ventilation, and                        response to care were monitored throughout the                        procedure. The physical status of the patient was                        re-assessed after the procedure.                       After obtaining informed consent, the endoscope was                        passed under direct vision. Throughout the procedure,                        the patient's blood pressure, pulse, and oxygen                        saturations were monitored continuously. The Endoscope  was introduced through the mouth, and advanced to the                        second part of duodenum. The Echoendoscope was                        introduced through the mouth, and advanced to the                        duodenum for ultrasound examination from the esophagus,                        stomach and duodenum. The upper EUS was accomplished                        without difficulty. The patient tolerated the procedure                        well. Findings:      ENDOSCOPIC FINDING: :      The examined esophagus was endoscopically normal.      A small hiatal hernia was present.      The  entire examined stomach was endoscopically normal.      A previously placed plastic bile duct stent was seen emerging from the       major ampulla.      The exam of the duodenum was otherwise normal.      ENDOSONOGRAPHIC FINDING: :      An irregular mass was identified in the pancreatic head. The mass was       hypoechoic. The mass measured 26 mm by 16 mm in maximal cross-sectional       diameter. The endosonographic borders were poorly-defined. There was       sonographic evidence suggesting invasion into the portal vein       (manifested by abutment). Fine needle biopsy was performed. Color       Doppler imaging was utilized prior to needle puncture to confirm a lack       of significant vascular structures within the needle path. Four passes       were made with the 25 gauge Medtronic SharkCore biopsy needle using a       transduodenal approach. The cellularity of the specimen was adequate.       Final cytology results are pending.      A conglomerate of anechoic lesions suggestive of multiple cysts were       identified in the pancreatic body/tail. The lesions in total measured 22       mm by 18 mm in maximal cross-sectional diameter.      The pancreatic duct had a normal endosonographic appearance in the       body/tail, where the duct measured 1.7 mm.      One stent was visualized endosonographically in the common bile duct.       Extension of the stent was noted in the common hepatic duct, which was       dilated and measured 9.6 mm. There appeared to be a stricture in the       region of the pancreas head mass.      Multiple abnormal lymph nodes were visualized in the celiac region       (level 20) and peripancreatic region. The largest measured 12  mm by 4 mm       in maximal cross-sectional diameter. The nodes were oval, isoechoic and       had well defined margins.      Endosonographic imaging in the left lobe of the liver showed no       abnormalities.      The celiac region  was visualized and showed no sign of significant       endosonographic abnormality. Impression:           EGD Impressions:                       - Normal esophagus.                       - Small hiatal hernia.                       - Normal stomach.                       - Plastic bile duct stent emerging from the major                        ampulla.                       EUS Impressions:                       - A mass was identified in the pancreatic head. The                        endosonographic appearance is highly suspicious for                        adenocarcinoma. This was staged T2 N1 Mx by                        endosonographic criteria. The staging applies if                        malignancy is confirmed. Please correlate vascular                        staging with cross-sectional imaging. Fine needle                        biopsy performed.                       - Multiple cystic lesions were seen in the pancreatic                        body/tail.                       - The pancreatic duct had a normal endosonographic                        appearance in the body/tail.                       - One stent was visualized endosonographically in the  common bile duct with mild upstream ductal dilation.                        Stricture in the region of the pancreas head mass.                       - Multiple abnormal lymph nodes were visualized in the                        celiac region (level 20) and peripancreatic region.                       - Normal visualized portions of the liver.                       - Normal celiac artery. Recommendation:       - Discharge patient to home (ambulatory).                       - Await cytology results.                       - The findings and recommendations were discussed with                        the patient.                       - Return to referring physician as previously                         scheduled. Further plan of care to be determined by the                        referring [facility] providers. Procedure Code(s):    --- Professional ---                       [phone], Esophagogastroduodenoscopy, flexible, transoral;                        with transendoscopic ultrasound-guided intramural or                        transmural fine needle aspiration/biopsy(s), (includes                        endoscopic ultrasound examination limited to the                        esophagus, stomach or duodenum, and adjacent structures) Diagnosis Code(s):    --- Professional ---                       R93.3, Abnormal findings on diagnostic imaging of other                        parts of digestive tract                       K83.1, Obstruction of bile duct  I89.9, Noninfective disorder of lymphatic vessels and                        lymph nodes, unspecified                       K86.2, Cyst of pancreas                       K44.9, Diaphragmatic hernia without obstruction or                        gangrene                       K86.89, Other specified diseases of pancreas CPT copyright 2019 [employer]. All rights reserved. The codes documented in this report are preliminary and upon coder review may  be revised to meet current compliance requirements. Attending Participation:      I personally performed the entire procedure without the assistance of a       fellow, resident or surgical assistant. [place],  11/18/2018 2:36:35 PM This report has been signed electronically. Number of Addenda: 0 Note Initiated On: 11/18/2018 1:48 PM Estimated Blood Loss: Estimated blood loss: none.      [facility]

## 2018-11-18 NOTE — Anesthesia Preprocedure Evaluation (Signed)
Anesthesia Evaluation  Patient identified by MRN, date of birth, ID band Patient awake    Reviewed: Allergy & Precautions, H&P , NPO status , Patient's Chart, lab work & pertinent test results  History of Anesthesia Complications Negative for: history of anesthetic complications  Airway Mallampati: III  TM Distance: <3 FB Neck ROM: limited    Dental  (+) Chipped   Pulmonary neg pulmonary ROS, neg shortness of breath,           Cardiovascular Exercise Tolerance: Good hypertension, (-) angina(-) Past MI and (-) DOE      Neuro/Psych CVA, Residual Symptoms negative psych ROS   GI/Hepatic negative GI ROS, Neg liver ROS, neg GERD  ,  Endo/Other  negative endocrine ROS  Renal/GU negative Renal ROS  negative genitourinary   Musculoskeletal   Abdominal   Peds  Hematology negative hematology ROS (+)   Anesthesia Other Findings Past Medical History: No date: Glaucoma No date: Hypertension No date: Osteopenia No date: Stroke [facility])  Past Surgical History: No date: BREAST BIOPSY; Right     Comment:  core- stereo- neg 10/29/2018: ERCP; N/A     Comment:  Procedure: ENDOSCOPIC RETROGRADE               CHOLANGIOPANCREATOGRAPHY (ERCP);  Surgeon: [name],               MD;  Location: [facility] ENDOSCOPY;  Service: Endoscopy;                Laterality: N/A; 01/02/2016: HIP ARTHROPLASTY; Left     Comment:  Procedure: ARTHROPLASTY BIPOLAR HIP (HEMIARTHROPLASTY);               Surgeon: Dereck Leep, MD;  Location: [facility] ORS;                Service: Orthopedics;  Laterality: Left; No date: TONSILLECTOMY  BMI    Body Mass Index: 27.96 kg/m      Reproductive/Obstetrics negative OB ROS                             Anesthesia Physical Anesthesia Plan  ASA: III  Anesthesia Plan: General   Post-op Pain Management:    Induction: Intravenous  PONV Risk Score and Plan: Propofol infusion and  TIVA  Airway Management Planned: Natural Airway and Nasal Cannula  Additional Equipment:   Intra-op Plan:   Post-operative Plan:   Informed Consent: I have reviewed the patients History and Physical, chart, labs and discussed the procedure including the risks, benefits and alternatives for the proposed anesthesia with the patient or authorized representative who has indicated his/her understanding and acceptance.     Dental Advisory Given  Plan Discussed with: Anesthesiologist, CRNA and Surgeon  Anesthesia Plan Comments: (Patient consented for risks of anesthesia including but not limited to:  - adverse reactions to medications - risk of intubation if required - damage to teeth, lips or other oral mucosa - sore throat or hoarseness - Damage to heart, brain, lungs or loss of life  Patient voiced understanding.)        Anesthesia Quick Evaluation

## 2018-11-18 NOTE — Interval H&P Note (Signed)
History and Physical Interval Note:  11/18/2018 1:52 PM  Melissa [name]  has presented today for surgery, with the diagnosis of Pancreatic mass.  The various methods of treatment have been discussed with the patient and family. After consideration of risks, benefits and other options for treatment, the patient has consented to  Procedure(s): FULL UPPER ENDOSCOPIC ULTRASOUND (EUS) RADIAL (N/A) as a surgical intervention.  The patient's history has been reviewed, patient examined, no change in status, stable for surgery.  I have reviewed the patient's chart and labs.  Questions were answered to the patient's satisfaction.     [name]

## 2018-11-19 ENCOUNTER — Telehealth: Payer: Self-pay

## 2018-11-19 ENCOUNTER — Encounter: Payer: Self-pay | Admitting: Internal Medicine

## 2018-11-19 ENCOUNTER — Other Ambulatory Visit: Payer: Self-pay | Admitting: Anatomic Pathology & Clinical Pathology

## 2018-11-19 ENCOUNTER — Other Ambulatory Visit: Payer: Self-pay | Admitting: Oncology

## 2018-11-19 NOTE — Telephone Encounter (Signed)
Called and arranged appointment for 11/23/18 for biopsy results. She can use daughter, Tina's phone for virtual visit. Phone number provided in appt notes.

## 2018-11-23 ENCOUNTER — Other Ambulatory Visit: Payer: Self-pay

## 2018-11-23 ENCOUNTER — Telehealth: Payer: Self-pay

## 2018-11-23 ENCOUNTER — Encounter: Payer: Self-pay | Admitting: Oncology

## 2018-11-23 ENCOUNTER — Inpatient Hospital Stay: Payer: Medicare Other | Attending: Oncology | Admitting: Oncology

## 2018-11-23 DIAGNOSIS — D649 Anemia, unspecified: Secondary | ICD-10-CM

## 2018-11-23 DIAGNOSIS — Z5111 Encounter for antineoplastic chemotherapy: Secondary | ICD-10-CM | POA: Insufficient documentation

## 2018-11-23 DIAGNOSIS — K831 Obstruction of bile duct: Secondary | ICD-10-CM

## 2018-11-23 DIAGNOSIS — C250 Malignant neoplasm of head of pancreas: Secondary | ICD-10-CM | POA: Diagnosis not present

## 2018-11-23 DIAGNOSIS — Z7189 Other specified counseling: Secondary | ICD-10-CM

## 2018-11-23 DIAGNOSIS — C801 Malignant (primary) neoplasm, unspecified: Secondary | ICD-10-CM

## 2018-11-23 DIAGNOSIS — E876 Hypokalemia: Secondary | ICD-10-CM | POA: Insufficient documentation

## 2018-11-23 MED ORDER — PROCHLORPERAZINE MALEATE 10 MG PO TABS: 10.0000 mg | ORAL_TABLET | Freq: Four times a day (QID) | ORAL | 1 refills | Status: DC | PRN

## 2018-11-23 MED ORDER — LIDOCAINE-PRILOCAINE 2.5-2.5 % EX CREA: TOPICAL_CREAM | CUTANEOUS | 3 refills | Status: DC

## 2018-11-23 MED ORDER — ONDANSETRON HCL 8 MG PO TABS: 8.0000 mg | ORAL_TABLET | Freq: Two times a day (BID) | ORAL | 1 refills | Status: DC | PRN

## 2018-11-23 NOTE — Progress Notes (Signed)
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

## 2018-11-23 NOTE — Telephone Encounter (Signed)
Spoke with patient about having her port placement surgery done at [facility] with Dr Dahlia Byes on 11/26/18. She is amendable to this. She will have Covid-19 testing done on 11/24/18, she is aware to arrive between 8-9 am at the [facility] testing.  Angie our surgery scheduler will contact her tomorrow to review further information.

## 2018-11-23 NOTE — Progress Notes (Signed)
Patient verified using two identifiers for virtual visit via telephone today.  Patient reports having 2 episodes in the past week with last one yesterday afternoon of feeling "hot on the inside" and shaking/jerking for 30 minutes.

## 2018-11-23 NOTE — Progress Notes (Signed)
HEMATOLOGY-ONCOLOGY TeleHEALTH VISIT PROGRESS NOTE  I connected with Melissa [name] on 11/23/18 at 10:30 AM EDT by video enabled telemedicine visit and verified that I am speaking with the correct person using two identifiers. I discussed the limitations, risks, security and privacy concerns of performing an evaluation and management service by telemedicine and the availability of in-person appointments. I also discussed with the patient that there may be a patient responsible charge related to this service. The patient expressed understanding and agreed to proceed.   Other persons participating in the visit and their role in the encounter:  Patient's husband and daughter who participated in discussion and asking questions.  Patient's location: Home  Provider's location: office Chief Complaint: Follow-up for pancreatic cancer.  Discussion of management plan.   INTERVAL HISTORY Melissa [name] is a 78 y.o. female who has above history reviewed by me today presents for follow up visit for management of pancreatic cancer Problems and complaints are listed below:  9/3/2020Patient underwent US with fine-needle biopsy of pancreatic mass Today patient reports feeling well at baseline.  Still weak and tired.  Denies any pain.   Review of Systems  Constitutional: Positive for appetite change, fatigue and unexpected weight change. Negative for chills and fever.  HENT:   Negative for hearing loss and voice change.   Eyes: Negative for eye problems.  Respiratory: Negative for chest tightness and cough.   Cardiovascular: Negative for chest pain.  Gastrointestinal: Negative for abdominal distention, abdominal pain and blood in stool.  Endocrine: Negative for hot flashes.  Genitourinary: Negative for difficulty urinating and frequency.   Musculoskeletal: Negative for arthralgias.  Skin: Negative for itching and rash.  Neurological: Negative for extremity weakness.  Hematological: Negative for  adenopathy.  Psychiatric/Behavioral: Negative for confusion.    Past Medical History:  Diagnosis Date  . Glaucoma   . Hypertension   . Osteopenia   . Stroke [facility])    Past Surgical History:  Procedure Laterality Date  . BREAST BIOPSY Right    core- stereo- neg  . ERCP N/A 10/29/2018   Procedure: ENDOSCOPIC RETROGRADE CHOLANGIOPANCREATOGRAPHY (ERCP);  Surgeon: [name], MD;  Location: [facility] ENDOSCOPY;  Service: Endoscopy;  Laterality: N/A;  . EUS N/A 11/18/2018   Procedure: FULL UPPER ENDOSCOPIC ULTRASOUND (EUS) RADIAL;  Surgeon: Holly Bodily, MD;  Location: [facility] ENDOSCOPY;  Service: Gastroenterology;  Laterality: N/A;  . HIP ARTHROPLASTY Left 01/02/2016   Procedure: ARTHROPLASTY BIPOLAR HIP (HEMIARTHROPLASTY);  Surgeon: Dereck Leep, MD;  Location: [facility] ORS;  Service: Orthopedics;  Laterality: Left;  . TONSILLECTOMY      Family History  Problem Relation Age of Onset  . Kidney disease Father   . Breast cancer Neg Hx     Social History   Socioeconomic History  . Marital status: Married    Spouse name: Not on file  . Number of children: Not on file  . Years of education: Not on file  . Highest education level: Not on file  Occupational History  . Not on file  Social Needs  . Financial resource strain: Not on file  . Food insecurity    Worry: Not on file    Inability: Not on file  . Transportation needs    Medical: Not on file    Non-medical: Not on file  Tobacco Use  . Smoking status: Never Smoker  . Smokeless tobacco: Never Used  Substance and Sexual Activity  . Alcohol use: No  . Drug use: Never  . Sexual activity: Not  on file  Lifestyle  . Physical activity    Days per week: Not on file    Minutes per session: Not on file  . Stress: Not on file  Relationships  . Social Herbalist on phone: Not on file    Gets together: Not on file    Attends religious service: Not on file    Active member of club or organization: Not on file    Attends  meetings of clubs or organizations: Not on file    Relationship status: Not on file  . Intimate partner violence    Fear of current or ex partner: Not on file    Emotionally abused: Not on file    Physically abused: Not on file    Forced sexual activity: Not on file  Other Topics Concern  . Not on file  Social History Narrative  . Not on file    Current Outpatient Medications on File Prior to Visit  Medication Sig Dispense Refill  . amLODipine (NORVASC) 10 MG tablet Take 10 mg by mouth daily.    Marland Kitchen acetaminophen (TYLENOL) 500 MG tablet Take 500 mg by mouth every 6 (six) hours as needed.    . hydrOXYzine (ATARAX/VISTARIL) 25 MG tablet Take 1 tablet (25 mg total) by mouth every 6 (six) hours as needed for itching. (Patient not taking: Reported on 11/23/2018) 15 tablet 0  . potassium chloride (K-DUR) 10 MEQ tablet Take 10 mEq by mouth 2 (two) times daily.    . traZODone (DESYREL) 50 MG tablet Take 1 tablet (50 mg total) by mouth at bedtime as needed for sleep. (Patient not taking: Reported on 11/23/2018) 30 tablet 0   No current facility-administered medications on file prior to visit.     No Known Allergies     Observations/Objective: Today's Vitals   11/23/18 1036  PainSc: 0-No pain   There is no height or weight on file to calculate BMI.  Physical Exam  Constitutional: She is oriented to person, place, and time. No distress.  Eyes: No scleral icterus.  Pulmonary/Chest: Effort normal.  Neurological: She is alert and oriented to person, place, and time.  Psychiatric: Affect normal.    CBC    Component Value Date/Time   WBC 8.4 10/30/2018 0531   RBC 3.25 (L) 10/30/2018 0531   HGB 10.4 (L) 10/30/2018 0531   HCT 30.5 (L) 10/30/2018 0531   PLT 318 10/30/2018 0531   MCV 93.8 10/30/2018 0531   MCH 32.0 10/30/2018 0531   MCHC 34.1 10/30/2018 0531   RDW 17.5 (H) 10/30/2018 0531   LYMPHSABS 0.7 10/29/2018 1318   MONOABS 0.8 10/29/2018 1318   EOSABS 0.2 10/29/2018 1318   BASOSABS  0.1 10/29/2018 1318    CMP     Component Value Date/Time   NA 137 10/31/2018 0543   K 3.3 (L) 10/31/2018 0543   CL 109 10/31/2018 0543   CO2 22 10/31/2018 0543   GLUCOSE 114 (H) 10/31/2018 0543   BUN 11 10/31/2018 0543   CREATININE 0.33 (L) 10/31/2018 0543   CALCIUM 8.1 (L) 10/31/2018 0543   PROT 5.8 (L) 10/31/2018 0543   ALBUMIN 2.0 (L) 10/31/2018 0543   AST 88 (H) 10/31/2018 0543   ALT 55 (H) 10/31/2018 0543   ALKPHOS 328 (H) 10/31/2018 0543   BILITOT 11.0 (H) 10/31/2018 0543   GFRNONAA >60 10/31/2018 0543   GFRAA >60 10/31/2018 0543     RADIOGRAPHIC STUDIES: I have personally reviewed the radiological images as listed  and agreed with the findings in the report. Ct Abdomen Pelvis W Contrast  Result Date: 10/20/2018 CLINICAL DATA:  Jaundice EXAM: CT ABDOMEN AND PELVIS WITH CONTRAST TECHNIQUE: Multidetector CT imaging of the abdomen and pelvis was performed using the standard protocol following bolus administration of intravenous contrast. CONTRAST:  126mL OMNIPAQUE IOHEXOL 300 MG/ML  SOLN COMPARISON:  None. FINDINGS: Lower chest: Lung bases demonstrate no acute consolidation or effusion. The heart size is within normal limits. Small hiatal hernia. Hepatobiliary: Subcentimeter hypodensity left hepatic lobe, too small to further characterize but probably a small cyst. Moderate intra hepatic biliary dilatation. Enlarged extrahepatic common bile duct, measuring up to 16 mm in diameter on coronal views. Distended gallbladder with numerous calcified stones. There may be slight gallbladder wall thickening or edema. Pancreas: Mild diffuse enlargement of the pancreatic head which appears somewhat indistinct. Mild hazy edema around the pancreatic head. Suspect indistinct mass at the head of pancreas. Mild pancreatic ductal dilatation. 11 mm indeterminate cystic lesion distal body/tail of pancreas, series 2, image number 27. Spleen: Normal in size without focal abnormality. Adrenals/Urinary Tract:  Adrenal glands are unremarkable. Kidneys are normal, without renal calculi, focal lesion, or hydronephrosis. Bladder is unremarkable. Stomach/Bowel: Stomach is within normal limits. Appendix appears normal. No evidence of bowel wall thickening, distention, or inflammatory changes. Vascular/Lymphatic: Nonaneurysmal aorta. Mild aortic atherosclerosis. Multiple enlarged peripancreatic and porta hepatis lymph nodes measuring up to 2.2 cm, coronal series 5, image number 34. Enlarged retroperitoneal/Peri aortic lymph nodes, measuring up to 2.2 cm. Central low density within the periaortic nodes, possibly due to necrosis. Reproductive: Uterus and bilateral adnexa are unremarkable. Other: Negative for free air or free fluid. Small fat in the umbilical region. Musculoskeletal: No acute or significant osseous findings. IMPRESSION: 1. Moderate to marked intra and extrahepatic biliary dilatation with mild diffuse dilatation of the pancreatic duct. Diffuse enlargement and indistinct appearance of the pancreatic head, constellation of findings is suspect for biliary obstruction secondary to malignant stricture/possible pancreatic head mass. Additional finding of small cystic lesions within the distal body of the pancreas which could be more thoroughly evaluated with MRI 2. Multiple calcified gallstones. Gallbladder slightly enlarged and there may be slight gallbladder wall edema or thickening 3. Multiple enlarged porta hepatis, peripancreatic, and retroperitoneal nodes, raising concern for metastatic disease. Electronically Signed   By: Donavan Foil M.D.   On: 10/20/2018 18:41   Nm Pet Image Initial (pi) Skull Base To Thigh  Result Date: 11/09/2018 CLINICAL DATA:  Initial treatment strategy for pancreatic mass. EXAM: NUCLEAR MEDICINE PET SKULL BASE TO THIGH TECHNIQUE: 8.4 mCi F-18 FDG was injected intravenously. Full-ring PET imaging was performed from the skull base to thigh after the radiotracer. CT data was obtained and  used for attenuation correction and anatomic localization. Fasting blood glucose: 131 mg/dl COMPARISON:  CT abdomen 10/20/2018 FINDINGS: Mediastinal blood pool activity: SUV max 2.3 Liver activity: SUV max 3.5 NECK: No significant abnormal hypermetabolic activity in this region. Incidental CT findings: Chronic right maxillary sinusitis. CHEST: No significant abnormal hypermetabolic activity in this region. Incidental CT findings: Atherosclerotic calcification of the aortic arch and branch vessels. ABDOMEN/PELVIS: Abnormal accentuated activity in the pancreatic head, maximum SUV 8.7, high suspicion for pancreatic head malignancy. The biliary stent passes through this region of abnormal accentuated metabolic activity. Hypermetabolic adjacent peripancreatic retroperitoneal lymph nodes. For example, a 1.3 cm peripancreatic lymph node a left periaortic lymph node measuring 1.4 cm in short axis on image 148/3 has a maximum SUV of 5.2. Other hypermetabolic porta hepatis  and retroperitoneal adenopathy is also present. No hypermetabolic hepatic mass is observed. Incidental CT findings: Moderately distended gallbladder containing numerous small gallstones. Trace amount of perihepatic fluid. Mild stranding around the pancreatic head and porta hepatis region and extending into the root of the mesentery. Mild hypodensity along the pancreatic tail on image 142/3 corresponding to a small fluid density lesion shown on prior CT scan in this vicinity. SKELETON: Mild accentuated activity along the left upper sacral ala and adjacent iliac bone. The sacral ala has a maximum SUV of 4.8 with the iliac bone having a maximum SUV of 4.7. Very faint sclerosis in the medullary space of the right proximal femur associated with accentuated metabolic activity, maximum SUV 4.0, this lesion measured 1.1 cm in diameter on image 246/3. Incidental CT findings: Left hip hemiarthroplasty. Lower lumbar degenerative facet arthropathy. Thoracic  spondylosis. IMPRESSION: 1. Abnormal prominence of the pancreatic head with indistinctness of associated tissue planes and abnormal hypermetabolic activity with maximum SUV up to 8.7, compatible with malignancy. Pancreatic adenocarcinoma with certainly be a top differential diagnostic consideration. 2. Surrounding peripancreatic, porta hepatis, and retroperitoneal lymph nodes are hypermetabolic indicating malignant involvement. 3. Is a faintly sclerotic 1.1 cm lesion in the right proximal femur with mild hypermetabolic activity, maximum SUV 4.0. Uncertain significance, but the possibility of osseous metastatic disease is raised. There are also indistinctly marginated areas of accentuated metabolic activity in the left sacral ala and adjacent iliac bone which could be inflammatory from mild sacroiliitis or due to malignancy, but these lesions do not have CT correlate. 4. Distended and mildly thick-walled gallbladder with numerous gallstones. A biliary stent is in place. Strictly speaking I cannot exclude acute cholecystitis. 5. Other imaging findings of potential clinical significance: Chronic right maxillary sinusitis. Aortic Atherosclerosis (ICD10-I70.0). Electronically Signed   By: Van Clines M.D.   On: 11/09/2018 13:22   Dg C-arm 1-60 Min-no Report  Result Date: 10/29/2018 Fluoroscopy was utilized by the requesting physician.  No radiographic interpretation.   US Abdomen Limited Ruq  Result Date: 10/29/2018 CLINICAL DATA:  Worsening jaundice. EXAM: ULTRASOUND ABDOMEN LIMITED RIGHT UPPER QUADRANT COMPARISON:  CT scan of October 20, 2018. FINDINGS: Gallbladder: Mild gallbladder distention is noted. Cholelithiasis is noted with mild gallbladder wall thickening and pericholecystic fluid. Some degree of sludge is present as well. No sonographic Murphy's sign is noted. Common bile duct: Diameter: Measures 17 mm proximally and 13 mm distally, consistent with distal common bile duct obstruction. Liver: No  focal lesion identified. Within normal limits in parenchymal echogenicity. Intrahepatic biliary dilatation is noted. Portal vein is patent on color Doppler imaging with normal direction of blood flow towards the liver. Other: None. IMPRESSION: Cholelithiasis is noted with mild gallbladder wall thickening and pericholecystic fluid, and some degree of sludge within gallbladder lumen. Mild gallbladder distention is noted. Cholecystitis cannot be excluded and clinical correlation is recommended. Significant intrahepatic and extrahepatic biliary dilatation is noted concerning for distal common bile duct obstruction, potentially due to pancreatic neoplasm as described on prior CT scan. Electronically Signed   By: Marijo Conception M.D.   On: 10/29/2018 12:55    Assessment and Plan: 1. Malignant neoplasm of head of pancreas ([place])   2. Goals of care, counseling/discussion   3. Obstructive jaundice due to malignant neoplasm (HCC)   4. Normocytic anemia     #PET scan images were independently reviewed by me and discussed with patient, daughter and husband. cTxN2 M1a Patient has at least M1 a disease due to retroperitoneal/peri-aortic lymph node  involvement,  sclerotic lesion of right proximal femur with mild hypermetabolic activity, as well as left sacrum. Biopsy pathology was discussed with patient. The diagnosis of stage IV pancreatic cancer and care plan were discussed with patient in detail.  NCCN guidelines were reviewed and shared with patient.   The goal of treatment which is to palliate disease, disease related symptoms, improve quality of life and hopefully prolong life was highlighted in our discussion.  Chemotherapy education was provided.  We had discussed the composition of chemotherapy regimen, length of chemo cycle, duration of treatment and the time to assess response to treatment.    I explained to the patient the risks and benefits of chemotherapy gemcitabine and Abraxane including all but not  limited to hair loss, mouth sore, nausea, vomiting, diarrhea, low blood counts, bleeding, neuropathy and risk of life threatening infection and even death, secondary malignancy etc.  . Patient voices understanding and willing to proceed chemotherapy.   # Chemotherapy education; refer to Dr. Dahlia Byes for [facility]- port placement. Antiemetics-Zofran and Compazine; EMLA cream sent to pharmacy  #Refer to genetic counselor # Anemia, check cbc.  Supportive care measures are necessary for patient well-being and will be provided as necessary. We spent sufficient time to discuss many aspect of care, questions were answered to patient's satisfaction.  Follow Up Instructions: 1 week for evaluation prior to cycle 1 gemcitabine and Abraxane.   I discussed the assessment and treatment plan with the patient. The patient was provided an opportunity to ask questions and all were answered. The patient agreed with the plan and demonstrated an understanding of the instructions.  The patient was advised to call back or seek an in-person evaluation if the symptoms worsen or if the condition fails to improve as anticipated.   I provided 45 minutes of face-to-face video visit time during this encounter, and > 50% was spent counseling as documented under my assessment & plan.  Earlie Server, MD 11/23/2018 7:50 PM

## 2018-11-24 ENCOUNTER — Ambulatory Visit (INDEPENDENT_AMBULATORY_CARE_PROVIDER_SITE_OTHER): Payer: Medicare Other | Admitting: Surgery

## 2018-11-24 ENCOUNTER — Encounter: Payer: Self-pay | Admitting: Surgery

## 2018-11-24 ENCOUNTER — Other Ambulatory Visit
Admission: RE | Admit: 2018-11-24 | Discharge: 2018-11-24 | Disposition: A | Discharge status: Home / Self Care | Payer: Medicare Other | Source: Ambulatory Visit | Attending: Surgery | Admitting: Surgery

## 2018-11-24 ENCOUNTER — Other Ambulatory Visit: Payer: Self-pay

## 2018-11-24 ENCOUNTER — Telehealth: Payer: Self-pay | Admitting: Surgery

## 2018-11-24 VITALS — BP 120/77 | HR 86 | Temp 99.0°F | Resp 16 | Ht 60.0 in | Wt 145.6 lb

## 2018-11-24 DIAGNOSIS — Z20828 Contact with and (suspected) exposure to other viral communicable diseases: Secondary | ICD-10-CM | POA: Diagnosis not present

## 2018-11-24 DIAGNOSIS — D490 Neoplasm of unspecified behavior of digestive system: Secondary | ICD-10-CM | POA: Diagnosis not present

## 2018-11-24 DIAGNOSIS — Z01812 Encounter for preprocedural laboratory examination: Secondary | ICD-10-CM | POA: Insufficient documentation

## 2018-11-24 DIAGNOSIS — C250 Malignant neoplasm of head of pancreas: Secondary | ICD-10-CM

## 2018-11-24 LAB — SARS CORONAVIRUS 2 (TAT 6-24 HRS): SARS Coronavirus 2: NEGATIVE

## 2018-11-24 NOTE — Progress Notes (Signed)
Referral sent to genetic counseling. I have reached out to Dr. Ronnald Ramp in pathology to determine if she has enough tissue for MMR. Noted limited tissue on cell block.

## 2018-11-24 NOTE — Progress Notes (Signed)
Patient ID: Melissa [name], female   DOB: 1940/04/04, 78 y.o.   MRN: [id number]  HPI Melissa [name] is a 78 y.o. female seen in consultation at the request of Melissa [name].  She was recently hospitalized last month for obstructive jaundice.  CT scan personally reviewed showing evidence of biliary dilation from head of the pancreas malignancy.,  There is evidence also of retroperitoneal lymph node involvement.  She did undergo ERCP as well by Dr.Wohl showing evidence of a distal common bile duct stricture.  I have personally reviewed her lab work and there has been improvement on T bili and overall liver enzymes.  She is not jaundiced anymore.  She denies any fevers any chills.  She was seen by Dr. Tasia [name] who recommended chemotherapy and port placement.  She denies any previous ports, no manipulations of the great veins.  No previous open heart surgeries.  She does have a pending MRI of the brain and PET given that she has some tonic-clonic movements that will question brain metastases.  Of note she underwent ultrasound-guided biopsy confirming the diagnosis.    HPI  Past Medical History:  Diagnosis Date  . Glaucoma   . Hypertension   . Osteopenia   . Stroke [facility])     Past Surgical History:  Procedure Laterality Date  . BREAST BIOPSY Right    core- stereo- neg  . ERCP N/A 10/29/2018   Procedure: ENDOSCOPIC RETROGRADE CHOLANGIOPANCREATOGRAPHY (ERCP);  Surgeon: Melissa Lame, MD;  Location: [facility] ENDOSCOPY;  Service: Endoscopy;  Laterality: N/A;  . EUS N/A 11/18/2018   Procedure: FULL UPPER ENDOSCOPIC ULTRASOUND (EUS) RADIAL;  Surgeon: Melissa Bodily, MD;  Location: [facility] ENDOSCOPY;  Service: Gastroenterology;  Laterality: N/A;  . HIP ARTHROPLASTY Left 01/02/2016   Procedure: ARTHROPLASTY BIPOLAR HIP (HEMIARTHROPLASTY);  Surgeon: Melissa Leep, MD;  Location: [facility] ORS;  Service: Orthopedics;  Laterality: Left;  . TONSILLECTOMY      Family History  Problem Relation Age of Onset  . Kidney disease  Father   . Breast cancer Neg Hx     Social History Social History   Tobacco Use  . Smoking status: Never Smoker  . Smokeless tobacco: Never Used  Substance Use Topics  . Alcohol use: No  . Drug use: Never    No Known Allergies  Current Outpatient Medications  Medication Sig Dispense Refill  . acetaminophen (TYLENOL) 500 MG tablet Take 500 mg by mouth every 6 (six) hours as needed.    Marland Kitchen amLODipine (NORVASC) 10 MG tablet Take 10 mg by mouth daily.    Marland Kitchen lidocaine-prilocaine (EMLA) cream Apply to affected area once 30 g 3   No current facility-administered medications for this visit.      Review of Systems Full ROS  was asked and was negative except for the information on the HPI  Physical Exam Blood pressure 120/77, pulse 86, temperature 99 F (37.2 C), temperature source Temporal, resp. rate 16, height 5' (1.524 m), weight 145 lb 9.6 oz (66 kg), SpO2 98 %. CONSTITUTIONAL: NAD EYES: Pupils are equal, round, and reactive to light, Sclera are non-icteric. EARS, NOSE, MOUTH AND THROAT: The oropharynx is clear. The oral mucosa is pink and moist. Hearing is intact to voice. LYMPH NODES:  Lymph nodes in the neck are normal. RESPIRATORY:  Lungs are clear. There is normal respiratory effort, with equal breath sounds bilaterally, and without pathologic use of accessory muscles. CARDIOVASCULAR: Heart is regular without murmurs, gallops, or rubs. GI: The abdomen is  soft, nontender, and  nondistended. Palpable Gb c/w  Courvoisier's law. There are no palpable masses. There is no hepatosplenomegaly. There are normal bowel sounds in all quadrants. GU: Rectal deferred.   MUSCULOSKELETAL: Normal muscle strength and tone. No cyanosis or edema.   SKIN: Turgor is good and there are no pathologic skin lesions or ulcers. NEUROLOGIC: Motor and sensation is grossly normal. Cranial nerves are grossly intact. PSYCH:  Oriented to person, place and time. Affect is normal.  Data Reviewed  I have  personally reviewed the patient's imaging, laboratory findings and medical records.    Assessment/Plan 78 year old female with recently diagnosed metastatic pancreatic cancer in need for chemotherapy.  Cussed with patient detail about her disease process and the need for port placement.  Procedure discussed with patient in detail and the husband.  Risk, benefits and possible complications including but not limited to: Bleeding, infection, injury to adjacent structures, vascular injuries and pneumothorax.  She understands and wishes to proceed we will plan for port placement this coming Friday    , MD FACS General Surgeon 11/24/2018, 2:57 PM

## 2018-11-24 NOTE — Patient Instructions (Addendum)
Your surgery is scheduled for 11/26/2018.     Please call our office with any questions or concerns that you have.   [facility]) A central line is a soft, flexible tube (catheter) that can be used to collect blood for testing or to give medicine or nutrition through a vein. The tip of the central line ends in a large vein just above the heart called the vena cava. A central line may be placed because:  You need to get medicines or fluids through an IV tube for a long period of time.  You need nutrition but cannot eat or absorb nutrients.  The veins in your hands or arms are hard to access.  You need to have blood taken often for blood tests.  You need a blood transfusion  You need chemotherapy or dialysis.  There are many types of central lines:  Peripherally inserted central catheter (PICC) line. This type is used for intermediate access to long-term access of one week or more. It can be used to draw blood and give fluids or medicines. A PICC looks like an IV tube, but it goes up the arm to the heart. It is usually inserted in the upper arm and taped in place on the arm.  Tunneled central line. This type is used for long-term therapy and dialysis. It is placed in a large vein in the neck, chest, or groin. A tunneled central line is inserted through a small incision made over the vein and is advanced into the heart. It is tunneled beneath the skin and brought out through a second incision.  Non-tunneled central line. This type is used for short-term access, usually of a maximum of 7 days. It is often used in the emergency department. A non-tunneled central line is inserted in the neck, chest, or groin.  Implanted port. This type is used for long-term therapy. It can stay in place longer than other types of central lines. An implanted port is normally inserted in the upper chest but can also be placed in the upper arm or in the abdomen. It is inserted and removed with  surgery, and it is accessed using a special needle.  The type of central line that you receive depends on how long you will need it, your medical condition, and the condition of your veins. What are the risks? Using any type of central line has risks that you should be aware of, including:  Infection.  A blood clot that blocks the central line or forms in the vein and travels to the heart.  Bleeding from the place where the central line was put in.  Developing a hole or crack within the central line. If this happens, the central line will need to be replaced.  Developing an abnormal heart rhythm (arrhythmia). This is rare.  Central line failure.  Follow these instructions at home: Flushing and cleaning the central line  Follow instructions from the health care provider about flushing and cleaning the central line.  Wear a mask when flushing or cleaning the central line.  Before you flush or clean the central line: ? Wash your hands with soap and water. ? Clean the central line hub with rubbing alcohol. Insertion site care  Keep the insertion site of your central line clean and dry at all times.  Check your incision or central line site every day for signs of infection. Check for: ? More redness, swelling, or pain. ? More fluid or blood. ? Warmth. ? Pus  or a bad smell. General instructions  Follow instructions from your health care provider for the type of device that you have.  If the central line accidentally gets pulled on, make sure: ? The bandage (dressing) is okay. ? There is no bleeding. ? The line has not been pulled out.  Return to your normal activities as told by your health care provider. Ask your health care provider what activities are safe for you. You may be restricted from lifting or making repetitive arm movements on the side with the catheter.  Do not swim or bathe unless your health care provider approves.  Keep your dressing dry. Your health care  provider can instruct you about how to keep your specific type of dressing from getting wet.  Keep all follow-up visits as told by your health care provider. This is important. Contact a health care provider if:  You have more redness, swelling, or pain around your incision.  You have more fluid or blood coming from your incision.  Your incision feels warm to the touch.  You have pus or a bad smell coming from your incision. Get help right away if:  You have: ? Chills. ? A fever. ? Shortness of breath. ? Trouble breathing. ? Chest pain. ? Swelling in your neck, face, chest, or arm on the side of your central line.  You are coughing.  You feel your heart beating rapidly or skipping beats.  You feel dizzy or you faint.  Your incision or central line site has red streaks spreading away from the area.  Your incision or central line site is bleeding and does not stop.  Your central line is difficult to flush or will not flush.  You do not get a blood return from the central line.  Your central line gets loose or comes out.  Your central line gets damaged.  Your catheter leaks when flushed or when fluids are infused into it. This information is not intended to replace advice given to you by your health care provider. Make sure you discuss any questions you have with your health care provider. Document Released: 04/24/2005 Document Revised: 10/31/2015 Document Reviewed: 10/10/2015 Elsevier Interactive Patient Education  2017 [employer].

## 2018-11-24 NOTE — H&P (View-Only) (Signed)
Patient ID: Melissa [name], female   DOB: [birth year], 78 y.o.   MRN: [id number]  HPI Melissa [name] is a 78 y.o. female seen in consultation at the request of Tasia Catchings.  She was recently hospitalized last month for obstructive jaundice.  CT scan personally reviewed showing evidence of biliary dilation from head of the pancreas malignancy.,  There is evidence also of retroperitoneal lymph node involvement.  She did undergo ERCP as well by Dr.Wohl showing evidence of a distal common bile duct stricture.  I have personally reviewed her lab work and there has been improvement on T bili and overall liver enzymes.  She is not jaundiced anymore.  She denies any fevers any chills.  She was seen by Dr. Tasia Catchings who recommended chemotherapy and port placement.  She denies any previous ports, no manipulations of the great veins.  No previous open heart surgeries.  She does have a pending MRI of the brain and PET given that she has some tonic-clonic movements that will question brain metastases.  Of note she underwent ultrasound-guided biopsy confirming the diagnosis.    HPI  Past Medical History:  Diagnosis Date  . Glaucoma   . Hypertension   . Osteopenia   . Stroke [facility])     Past Surgical History:  Procedure Laterality Date  . BREAST BIOPSY Right    core- stereo- neg  . ERCP N/A 10/29/2018   Procedure: ENDOSCOPIC RETROGRADE CHOLANGIOPANCREATOGRAPHY (ERCP);  Surgeon: [name], MD;  Location: [facility] ENDOSCOPY;  Service: Endoscopy;  Laterality: N/A;  . EUS N/A 11/18/2018   Procedure: FULL UPPER ENDOSCOPIC ULTRASOUND (EUS) RADIAL;  Surgeon: Holly Bodily, MD;  Location: [facility] ENDOSCOPY;  Service: Gastroenterology;  Laterality: N/A;  . HIP ARTHROPLASTY Left 01/02/2016   Procedure: ARTHROPLASTY BIPOLAR HIP (HEMIARTHROPLASTY);  Surgeon: Dereck Leep, MD;  Location: [facility] ORS;  Service: Orthopedics;  Laterality: Left;  . TONSILLECTOMY      Family History  Problem Relation Age of Onset  . Kidney disease  Father   . Breast cancer Neg Hx     Social History Social History   Tobacco Use  . Smoking status: Never Smoker  . Smokeless tobacco: Never Used  Substance Use Topics  . Alcohol use: No  . Drug use: Never    No Known Allergies  Current Outpatient Medications  Medication Sig Dispense Refill  . acetaminophen (TYLENOL) 500 MG tablet Take 500 mg by mouth every 6 (six) hours as needed.    Marland Kitchen amLODipine (NORVASC) 10 MG tablet Take 10 mg by mouth daily.    Marland Kitchen lidocaine-prilocaine (EMLA) cream Apply to affected area once 30 g 3   No current facility-administered medications for this visit.      Review of Systems Full ROS  was asked and was negative except for the information on the HPI  Physical Exam Blood pressure 120/77, pulse 86, temperature 99 F (37.2 C), temperature source Temporal, resp. rate 16, height 5' (1.524 m), weight 145 lb 9.6 oz (66 kg), SpO2 98 %. CONSTITUTIONAL: NAD EYES: Pupils are equal, round, and reactive to light, Sclera are non-icteric. EARS, NOSE, MOUTH AND THROAT: The oropharynx is clear. The oral mucosa is pink and moist. Hearing is intact to voice. LYMPH NODES:  Lymph nodes in the neck are normal. RESPIRATORY:  Lungs are clear. There is normal respiratory effort, with equal breath sounds bilaterally, and without pathologic use of accessory muscles. CARDIOVASCULAR: Heart is regular without murmurs, gallops, or rubs. GI: The abdomen is  soft, nontender, and  nondistended. Palpable Gb c/w  Courvoisier's law. There are no palpable masses. There is no hepatosplenomegaly. There are normal bowel sounds in all quadrants. GU: Rectal deferred.   MUSCULOSKELETAL: Normal muscle strength and tone. No cyanosis or edema.   SKIN: Turgor is good and there are no pathologic skin lesions or ulcers. NEUROLOGIC: Motor and sensation is grossly normal. Cranial nerves are grossly intact. PSYCH:  Oriented to person, place and time. Affect is normal.  Data Reviewed  I have  personally reviewed the patient's imaging, laboratory findings and medical records.    Assessment/Plan 78 year old female with recently diagnosed metastatic pancreatic cancer in need for chemotherapy.  Cussed with patient detail about her disease process and the need for port placement.  Procedure discussed with patient in detail and the husband.  Risk, benefits and possible complications including but not limited to: Bleeding, infection, injury to adjacent structures, vascular injuries and pneumothorax.  She understands and wishes to proceed we will plan for port placement this coming Friday   Laura Radilla, MD FACS General Surgeon 11/24/2018, 2:57 PM

## 2018-11-24 NOTE — Telephone Encounter (Signed)
Pt advised of pre op date/time and sx date. Sx: 11/26/18 with Dr Jeremy Johann Placement.  Pre op: 11/25/18 @ 7:30am-arrive at [facility], patient informed she will be escorted to the preadmission office.  Patient did have her Covid testing completed today.   Patient made aware to call [phone], between 1-3:00pm the day before surgery, to find out what time to arrive.

## 2018-11-25 ENCOUNTER — Ambulatory Visit
Admission: RE | Admit: 2018-11-25 | Discharge: 2018-11-25 | Disposition: A | Discharge status: Home / Self Care | Payer: Medicare Other | Source: Ambulatory Visit | Attending: Oncology | Admitting: Oncology

## 2018-11-25 ENCOUNTER — Encounter
Admission: RE | Admit: 2018-11-25 | Discharge: 2018-11-25 | Disposition: A | Discharge status: Home / Self Care | Payer: Medicare Other | Source: Ambulatory Visit | Attending: Surgery | Admitting: Surgery

## 2018-11-25 ENCOUNTER — Other Ambulatory Visit: Payer: Medicare Other

## 2018-11-25 DIAGNOSIS — C250 Malignant neoplasm of head of pancreas: Secondary | ICD-10-CM | POA: Diagnosis present

## 2018-11-25 MED ORDER — GADOBUTROL 1 MMOL/ML IV SOLN
6.0000 mL | Freq: Once | INTRAVENOUS | Status: AC | PRN
  Administered 2018-11-25: 16:00:00 6 mL via INTRAVENOUS

## 2018-11-25 NOTE — Anesthesia Postprocedure Evaluation (Signed)
Anesthesia Post Note  Patient: Melissa [name]  Procedure(s) Performed: FULL UPPER ENDOSCOPIC ULTRASOUND (EUS) RADIAL (N/A )  Patient location during evaluation: Endoscopy Anesthesia Type: General Level of consciousness: awake and alert Pain management: pain level controlled Vital Signs Assessment: post-procedure vital signs reviewed and stable Respiratory status: spontaneous breathing, nonlabored ventilation and respiratory function stable Cardiovascular status: blood pressure returned to baseline and stable Postop Assessment: no apparent nausea or vomiting Anesthetic complications: no     Last Vitals:  Vitals:   11/18/18 1441 11/18/18 1448  BP:  115/63  Pulse: 82 85  Resp: 20 17  Temp:    SpO2: 99% 98%    Last Pain:  Vitals:   11/18/18 1448  TempSrc:   PainSc: 0-No pain                 Alphonsus Sias

## 2018-11-25 NOTE — Anesthesia Postprocedure Evaluation (Deleted)
Anesthesia Post Note  Patient: Melissa [name]  Procedure(s) Performed: FULL UPPER ENDOSCOPIC ULTRASOUND (EUS) RADIAL (N/A )  Patient location during evaluation: PACU Anesthesia Type: General Level of consciousness: awake and alert Pain management: pain level controlled Vital Signs Assessment: post-procedure vital signs reviewed and stable Respiratory status: spontaneous breathing, nonlabored ventilation, respiratory function stable and patient connected to nasal cannula oxygen Cardiovascular status: blood pressure returned to baseline and stable Postop Assessment: no apparent nausea or vomiting Anesthetic complications: no     Last Vitals:  Vitals:   11/18/18 1441 11/18/18 1448  BP:  115/63  Pulse: 82 85  Resp: 20 17  Temp:    SpO2: 99% 98%    Last Pain:  Vitals:   11/18/18 1448  TempSrc:   PainSc: 0-No pain                 Alphonsus Sias

## 2018-11-25 NOTE — Patient Instructions (Signed)
Your procedure is scheduled on: 11/26/10 Report to [place]. To find out your arrival time please call [phone] between 1PM - 3PM on Today.  Remember: Instructions that are not followed completely may result in serious medical risk, up to and including death, or upon the discretion of your surgeon and anesthesiologist your surgery may need to be rescheduled.     _X__ 1. Do not eat food after midnight the night before your procedure.                 No gum chewing or hard candies. You may drink clear liquids up to 2 hours                 before you are scheduled to arrive for your surgery- DO not drink clear                 liquids within 2 hours of the start of your surgery.                 Clear Liquids include:  water, apple juice without pulp, clear carbohydrate                 drink such as Clearfast or Gatorade, Black Coffee or Tea (Do not add                 anything to coffee or tea). Diabetics water only  __X__2.  On the morning of surgery brush your teeth with toothpaste and water, you                 may rinse your mouth with mouthwash if you wish.  Do not swallow any              toothpaste of mouthwash.     _X__ 3.  No Alcohol for 24 hours before or after surgery.   _X__ 4.  Do Not Smoke or use e-cigarettes For 24 Hours Prior to Your Surgery.                 Do not use any chewable tobacco products for at least 6 hours prior to                 surgery.  ____  5.  Bring all medications with you on the day of surgery if instructed.   __X__  6.  Notify your doctor if there is any change in your medical condition      (cold, fever, infections).     Do not wear jewelry, make-up, hairpins, clips or nail polish. Do not wear lotions, powders, or perfumes.  Do not shave 48 hours prior to surgery. Men may shave face and neck. Do not bring valuables to the hospital.    [facility] is not responsible for any belongings or  valuables.  Contacts, dentures/partials or body piercings may not be worn into surgery. Bring a case for your contacts, glasses or hearing aids, a denture cup will be supplied. Leave your suitcase in the car. After surgery it may be brought to your room. For patients admitted to the hospital, discharge time is determined by your treatment team.   Patients discharged the day of surgery will not be allowed to drive home.   Please read over the following fact sheets that you were given:   MRSA Information  __X__ Take these medicines the morning of surgery with A SIP OF WATER:  1. none  2.   3.   4.  5.  6.  ____ Fleet Enema (as directed)   __X__ Use CHG Soap/SAGE wipes as directed  ____ Use inhalers on the day of surgery  ____ Stop metformin/Janumet/Farxiga 2 days prior to surgery    ____ Take 1/2 of usual insulin dose the night before surgery. No insulin the morning          of surgery.   ____ Stop Blood Thinners Coumadin/Plavix/Xarelto/Pleta/Pradaxa/Eliquis/Effient/Aspirin  on   Or contact your Surgeon, Cardiologist or Medical Doctor regarding  ability to stop your blood thinners  __X__ Stop Anti-inflammatories 7 days before surgery such as Advil, Ibuprofen, Motrin,  BC or Goodies Powder, Naprosyn, Naproxen, Aleve, Aspirin    __X__ Stop all herbal supplements, fish oil or vitamin E until after surgery.    ____ Bring C-Pap to the hospital.

## 2018-11-25 NOTE — Progress Notes (Addendum)
Tumor Board Documentation  SHENANDOAH ROBILLARD was presented by Dr Tasia Catchings at our Tumor Board on 11/25/2018, which included representatives from medical oncology, radiation oncology, pathology, radiology, surgical, surgical oncology, navigation, internal medicine, pharmacy, research, palliative care.  Orion currently presents as a current patient, for [place], for new positive pathology with history of the following treatments: active survellience.  Additionally, we reviewed previous medical and familial history, history of present illness, and recent lab results along with all available histopathologic and imaging studies. The tumor board considered available treatment options and made the following recommendations: Palliative Chemotherapy    The following procedures/referrals were also placed: No orders of the defined types were placed in this encounter.   Clinical Trial Status: not discussed   Staging used: AJCC Stage Group  AJCC Staging: T: x N: 2 M: 1 Group: Adenocarcinoma of Pancreas   National site-specific guidelines NCCN were discussed with respect to the case.  Tumor board is a meeting of clinicians from various specialty areas who evaluate and discuss patients for whom a multidisciplinary approach is being considered. Final determinations in the plan of care are those of the provider(s). The responsibility for follow up of recommendations given during tumor board is that of the provider.   Today's extended care, comprehensive team conference, Ashley was not present for the discussion and was not examined.   Multidisciplinary Tumor Board is a multidisciplinary case peer review process.  Decisions discussed in the Multidisciplinary Tumor Board reflect the opinions of the specialists present at the conference without having examined the patient.  Ultimately, treatment and diagnostic decisions rest with the primary provider(s) and the patient.

## 2018-11-26 ENCOUNTER — Ambulatory Visit
Admission: RE | Admit: 2018-11-26 | Discharge: 2018-11-26 | Disposition: A | Discharge status: Home / Self Care | Payer: Medicare Other | Attending: Surgery | Admitting: Surgery

## 2018-11-26 ENCOUNTER — Ambulatory Visit: Payer: Medicare Other | Admitting: Anesthesiology

## 2018-11-26 ENCOUNTER — Encounter
Admission: RE | Disposition: A | Discharge status: Home / Self Care | Payer: Self-pay | Source: Home / Self Care | Attending: Surgery

## 2018-11-26 ENCOUNTER — Encounter: Payer: Self-pay | Admitting: *Deleted

## 2018-11-26 ENCOUNTER — Ambulatory Visit
Admission: RE | Admit: 2018-11-26 | Discharge: 2018-11-26 | Disposition: A | Discharge status: Home / Self Care | Payer: Medicare Other | Source: Home / Self Care | Attending: Surgery | Admitting: Surgery

## 2018-11-26 ENCOUNTER — Ambulatory Visit: Payer: Medicare Other

## 2018-11-26 ENCOUNTER — Other Ambulatory Visit: Payer: Self-pay

## 2018-11-26 DIAGNOSIS — C772 Secondary and unspecified malignant neoplasm of intra-abdominal lymph nodes: Secondary | ICD-10-CM | POA: Insufficient documentation

## 2018-11-26 DIAGNOSIS — C250 Malignant neoplasm of head of pancreas: Secondary | ICD-10-CM

## 2018-11-26 DIAGNOSIS — I10 Essential (primary) hypertension: Secondary | ICD-10-CM | POA: Diagnosis not present

## 2018-11-26 DIAGNOSIS — Z79899 Other long term (current) drug therapy: Secondary | ICD-10-CM | POA: Diagnosis not present

## 2018-11-26 DIAGNOSIS — I6930 Unspecified sequelae of cerebral infarction: Secondary | ICD-10-CM | POA: Diagnosis not present

## 2018-11-26 DIAGNOSIS — D490 Neoplasm of unspecified behavior of digestive system: Secondary | ICD-10-CM

## 2018-11-26 DIAGNOSIS — Z96642 Presence of left artificial hip joint: Secondary | ICD-10-CM | POA: Diagnosis not present

## 2018-11-26 HISTORY — PX: PORTACATH PLACEMENT: SHX2246

## 2018-11-26 SURGERY — INSERTION, TUNNELED CENTRAL VENOUS DEVICE, WITH PORT
Anesthesia: General | Laterality: Right

## 2018-11-26 MED ORDER — ONDANSETRON HCL 4 MG/2ML IJ SOLN
INTRAMUSCULAR | Status: AC
  Filled 2018-11-26: qty 2

## 2018-11-26 MED ORDER — PROPOFOL 10 MG/ML IV BOLUS
INTRAVENOUS | Status: DC | PRN
  Administered 2018-11-26: 20 mg via INTRAVENOUS
  Administered 2018-11-26: 50 mg via INTRAVENOUS
  Administered 2018-11-26: 10 mg via INTRAVENOUS

## 2018-11-26 MED ORDER — FENTANYL CITRATE (PF) 100 MCG/2ML IJ SOLN
INTRAMUSCULAR | Status: DC | PRN
  Administered 2018-11-26 (×2): 25 ug via INTRAVENOUS

## 2018-11-26 MED ORDER — BUPIVACAINE-EPINEPHRINE (PF) 0.25% -1:200000 IJ SOLN
INTRAMUSCULAR | Status: DC | PRN
  Administered 2018-11-26: 10 mL

## 2018-11-26 MED ORDER — PROPOFOL 500 MG/50ML IV EMUL
INTRAVENOUS | Status: DC | PRN
  Administered 2018-11-26: 100 ug/kg/min via INTRAVENOUS

## 2018-11-26 MED ORDER — HYDROCODONE-ACETAMINOPHEN 5-325 MG PO TABS: 1.0000 | ORAL_TABLET | Freq: Four times a day (QID) | ORAL | 0 refills | Status: DC | PRN

## 2018-11-26 MED ORDER — HEPARIN SODIUM (PORCINE) 5000 UNIT/ML IJ SOLN
INTRAMUSCULAR | Status: AC
  Filled 2018-11-26: qty 1

## 2018-11-26 MED ORDER — HEPARIN SOD (PORK) LOCK FLUSH 100 UNIT/ML IV SOLN
INTRAVENOUS | Status: AC
  Filled 2018-11-26: qty 5

## 2018-11-26 MED ORDER — LIDOCAINE HCL (PF) 1 % IJ SOLN
INTRAMUSCULAR | Status: DC | PRN
  Administered 2018-11-26: 10 mL

## 2018-11-26 MED ORDER — SODIUM CHLORIDE 0.9 % IV SOLN
INTRAVENOUS | Status: DC | PRN
  Administered 2018-11-26: 1 mL via INTRAMUSCULAR

## 2018-11-26 MED ORDER — FAMOTIDINE 20 MG PO TABS
20.0000 mg | ORAL_TABLET | Freq: Once | ORAL | Status: AC
  Administered 2018-11-26: 20 mg via ORAL

## 2018-11-26 MED ORDER — FAMOTIDINE 20 MG PO TABS
ORAL_TABLET | ORAL | Status: AC
  Filled 2018-11-26: qty 1

## 2018-11-26 MED ORDER — PROPOFOL 500 MG/50ML IV EMUL
INTRAVENOUS | Status: AC
  Filled 2018-11-26: qty 50

## 2018-11-26 MED ORDER — BUPIVACAINE-EPINEPHRINE (PF) 0.25% -1:200000 IJ SOLN
INTRAMUSCULAR | Status: AC
  Filled 2018-11-26: qty 30

## 2018-11-26 MED ORDER — CHLORHEXIDINE GLUCONATE CLOTH 2 % EX PADS: 6.0000 | MEDICATED_PAD | Freq: Once | CUTANEOUS | Status: DC

## 2018-11-26 MED ORDER — CEFAZOLIN SODIUM-DEXTROSE 2-4 GM/100ML-% IV SOLN
INTRAVENOUS | Status: AC
  Filled 2018-11-26: qty 100

## 2018-11-26 MED ORDER — LACTATED RINGERS IV SOLN
INTRAVENOUS | Status: DC
  Administered 2018-11-26: 11:00:00 via INTRAVENOUS

## 2018-11-26 MED ORDER — ONDANSETRON HCL 4 MG/2ML IJ SOLN
INTRAMUSCULAR | Status: DC | PRN
  Administered 2018-11-26: 4 mg via INTRAVENOUS

## 2018-11-26 MED ORDER — LIDOCAINE HCL (PF) 1 % IJ SOLN
INTRAMUSCULAR | Status: AC
  Filled 2018-11-26: qty 30

## 2018-11-26 MED ORDER — FENTANYL CITRATE (PF) 100 MCG/2ML IJ SOLN: 25.0000 ug | INTRAMUSCULAR | Status: DC | PRN

## 2018-11-26 MED ORDER — LIDOCAINE HCL (PF) 2 % IJ SOLN
INTRAMUSCULAR | Status: AC
  Filled 2018-11-26: qty 10

## 2018-11-26 MED ORDER — ONDANSETRON HCL 4 MG/2ML IJ SOLN: 4.0000 mg | Freq: Once | INTRAMUSCULAR | Status: DC | PRN

## 2018-11-26 MED ORDER — FENTANYL CITRATE (PF) 100 MCG/2ML IJ SOLN
INTRAMUSCULAR | Status: AC
  Filled 2018-11-26: qty 2

## 2018-11-26 MED ORDER — CEFAZOLIN SODIUM-DEXTROSE 2-4 GM/100ML-% IV SOLN
2.0000 g | INTRAVENOUS | Status: AC
  Administered 2018-11-26: 2 g via INTRAVENOUS

## 2018-11-26 MED ORDER — LIDOCAINE HCL (CARDIAC) PF 100 MG/5ML IV SOSY
PREFILLED_SYRINGE | INTRAVENOUS | Status: DC | PRN
  Administered 2018-11-26: 80 mg via INTRAVENOUS

## 2018-11-26 SURGICAL SUPPLY — 36 items
BAG DECANTER FOR FLEXI CONT (MISCELLANEOUS) ×3 IMPLANT
BLADE SURG SZ11 CARB STEEL (BLADE) ×3 IMPLANT
BOOT SUTURE AID YELLOW STND (SUTURE) ×3 IMPLANT
CANISTER SUCT 1200ML W/VALVE (MISCELLANEOUS) ×3 IMPLANT
CHLORAPREP W/TINT 26 (MISCELLANEOUS) ×3 IMPLANT
COVER LIGHT HANDLE STERIS (MISCELLANEOUS) ×6 IMPLANT
COVER WAND RF STERILE (DRAPES) ×3 IMPLANT
DERMABOND ADVANCED (GAUZE/BANDAGES/DRESSINGS) ×2
DERMABOND ADVANCED .7 DNX12 (GAUZE/BANDAGES/DRESSINGS) ×1 IMPLANT
DRAPE 3/4 80X56 (DRAPES) ×3 IMPLANT
DRAPE C-ARM XRAY 36X54 (DRAPES) ×6 IMPLANT
DRAPE INCISE IOBAN 66X45 STRL (DRAPES) ×3 IMPLANT
ELECT CAUTERY BLADE 6.4 (BLADE) ×3 IMPLANT
ELECT REM PT RETURN 9FT ADLT (ELECTROSURGICAL) ×3
ELECTRODE REM PT RTRN 9FT ADLT (ELECTROSURGICAL) ×1 IMPLANT
GEL ULTRASOUND 20GR AQUASONIC (MISCELLANEOUS) ×3 IMPLANT
GLOVE BIO SURGEON STRL SZ7 (GLOVE) ×3 IMPLANT
GOWN STRL REUS W/ TWL LRG LVL3 (GOWN DISPOSABLE) ×2 IMPLANT
GOWN STRL REUS W/TWL LRG LVL3 (GOWN DISPOSABLE) ×4
IV NS 500ML (IV SOLUTION) ×2
IV NS 500ML BAXH (IV SOLUTION) ×1 IMPLANT
KIT PORT POWER 8FR ISP CVUE (Port) ×3 IMPLANT
NEEDLE HYPO 22GX1.5 SAFETY (NEEDLE) ×3 IMPLANT
NS IRRIG 1000ML POUR BTL (IV SOLUTION) ×3 IMPLANT
PACK PORT-A-CATH (MISCELLANEOUS) ×3 IMPLANT
SPONGE LAP 18X18 RF (DISPOSABLE) ×3 IMPLANT
SUT MNCRL AB 4-0 PS2 18 (SUTURE) ×3 IMPLANT
SUT PROLENE 2-0 (SUTURE) ×2
SUT PROLENE 2-0 RB1 36X2 ARM (SUTURE) ×1
SUT VIC AB 3-0 SH 27 (SUTURE) ×2
SUT VIC AB 3-0 SH 27X BRD (SUTURE) ×1 IMPLANT
SUTURE PROLEN 2-0 RB1 36X2 ARM (SUTURE) ×1 IMPLANT
SYR 10ML LL (SYRINGE) ×3 IMPLANT
SYR 20ML LL LF (SYRINGE) ×3 IMPLANT
SYR 5ML LL (SYRINGE) ×3 IMPLANT
TOWEL OR 17X26 4PK STRL BLUE (TOWEL DISPOSABLE) ×3 IMPLANT

## 2018-11-26 NOTE — Anesthesia Postprocedure Evaluation (Signed)
Anesthesia Post Note  Patient: Melissa [name]  Procedure(s) Performed: INSERTION PORT-A-CATH (Right )  Patient location during evaluation: PACU Anesthesia Type: General Level of consciousness: awake and alert and oriented Pain management: pain level controlled Vital Signs Assessment: post-procedure vital signs reviewed and stable Respiratory status: spontaneous breathing Cardiovascular status: blood pressure returned to baseline Anesthetic complications: no     Last Vitals:  Vitals:   11/26/18 1441 11/26/18 1502  BP: 135/69 136/70  Pulse: 83 85  Resp: 16 16  Temp: (!) 36.1 C   SpO2: 96% 98%    Last Pain:  Vitals:   11/26/18 1502  TempSrc:   PainSc: 0-No pain                 Noach Calvillo

## 2018-11-26 NOTE — Op Note (Signed)
  Pre-operative Diagnosis: pancreatic Ca  Post-operative Diagnosis: Same   Surgeon: Caroleen Hamman, MD FACS  Anesthesia: IV sedation, marcaine .25% w epi and lidocaine 1%  Procedure: right IJ  Port placement with fluoroscopy under U/S guidance  Findings: Good position of the tip of the catheter by fluoroscopy  Estimated Blood Loss: Minimal         Drains: None         Specimens: None       Complications: none            Procedure Details  The patient was seen again in the Holding Room. The benefits, complications, treatment options, and expected outcomes were discussed with the patient. The risks of bleeding, infection, recurrence of symptoms, failure to resolve symptoms,  thrombosis nonfunction breakage pneumothorax hemopneumothorax any of which could require chest tube or further surgery were reviewed with the patient.   The patient was taken to Operating Room, identified as Melissa [name] and the procedure verified.  A Time Out was held and the above information confirmed.  Prior to the induction of general anesthesia, antibiotic prophylaxis was administered. VTE prophylaxis was in place. Appropriate anesthesia was then administered and tolerated well. The chest was prepped with Chloraprep and draped in the sterile fashion. The patient was positioned in the supine position. Then the patient was placed in Trendelenburg position.  Patient was prepped and draped in sterile fashion and in a Trendelenburg position local anesthetic was infiltrated into the skin and subcutaneous tissues in the neck and anterior chest wall. The large bore needle was placed into the internal jugular vein under U/S guidance without difficulty and then the Seldinger wire was advanced. Fluoroscopy was utilized to confirm that the Seldinger wire was in the superior vena cava.  An incision was made and a port pocket developed with blunt and electrocautery dissection. The introducer dilator was placed over the  Seldinger wire the wire was removed. The previously flushed catheter was placed into the introducer dilator and the peel-away sheath was removed. The catheter length was confirmed and trimmed utilizing fluoroscopy for proper positioning. The catheter was then attached to the previously flushed port. The port was placed into the pocket. The port was held in with 2-0 Prolenes and flushed for function and heparin locked.  The wound was closed with interrupted 3-0 Vicryl followed by 4-0 subcuticular Monocryl sutures. Dermabond used to coat the skin  Patient was taken to the recovery room in stable condition where a postoperative chest film has been ordered.

## 2018-11-26 NOTE — Interval H&P Note (Signed)
History and Physical Interval Note:  11/26/2018 10:34 AM  Melissa [name]  has presented today for surgery, with the diagnosis of Pancreatic Cancer.  The various methods of treatment have been discussed with the patient and family. After consideration of risks, benefits and other options for treatment, the patient has consented to  Procedure(s): INSERTION PORT-A-CATH (N/A) as a surgical intervention.  The patient's history has been reviewed, patient examined, no change in status, stable for surgery.  I have reviewed the patient's chart and labs.  Questions were answered to the patient's satisfaction.     [place]

## 2018-11-26 NOTE — Patient Instructions (Signed)
Nanoparticle Albumin-Bound Paclitaxel injection What is this medicine? NANOPARTICLE ALBUMIN-BOUND PACLITAXEL (Na no PAHR ti kuhl al BYOO muhn-bound PAK li TAX el) is a chemotherapy drug. It targets fast dividing cells, like cancer cells, and causes these cells to die. This medicine is used to treat advanced breast cancer, lung cancer, and pancreatic cancer. This medicine may be used for other purposes; ask your health care provider or pharmacist if you have questions. COMMON BRAND NAME(S): Abraxane What should I tell my health care provider before I take this medicine? They need to know if you have any of these conditions:  kidney disease  liver disease  low blood counts, like low white cell, platelet, or red cell counts  lung or breathing disease, like asthma  tingling of the fingers or toes, or other nerve disorder  an unusual or allergic reaction to paclitaxel, albumin, other chemotherapy, other medicines, foods, dyes, or preservatives  pregnant or trying to get pregnant  breast-feeding How should I use this medicine? This drug is given as an infusion into a vein. It is administered in a hospital or clinic by a specially trained health care professional. Talk to your pediatrician regarding the use of this medicine in children. Special care may be needed. Overdosage: If you think you have taken too much of this medicine contact a poison control center or emergency room at once. NOTE: This medicine is only for you. Do not share this medicine with others. What if I miss a dose? It is important not to miss your dose. Call your doctor or health care professional if you are unable to keep an appointment. What may interact with this medicine? This medicine may interact with the following medications:  antiviral medicines for hepatitis, HIV or AIDS  certain antibiotics like erythromycin and clarithromycin  certain medicines for fungal infections like ketoconazole and  itraconazole  certain medicines for seizures like carbamazepine, phenobarbital, phenytoin  gemfibrozil  nefazodone  rifampin  St. John's wort This list may not describe all possible interactions. Give your health care provider a list of all the medicines, herbs, non-prescription drugs, or dietary supplements you use. Also tell them if you smoke, drink alcohol, or use illegal drugs. Some items may interact with your medicine. What should I watch for while using this medicine? Your condition will be monitored carefully while you are receiving this medicine. You will need important blood work done while you are taking this medicine. This medicine can cause serious allergic reactions. If you experience allergic reactions like skin rash, itching or hives, swelling of the face, lips, or tongue, tell your doctor or health care professional right away. In some cases, you may be given additional medicines to help with side effects. Follow all directions for their use. This drug may make you feel generally unwell. This is not uncommon, as chemotherapy can affect healthy cells as well as cancer cells. Report any side effects. Continue your course of treatment even though you feel ill unless your doctor tells you to stop. Call your doctor or health care professional for advice if you get a fever, chills or sore throat, or other symptoms of a cold or flu. Do not treat yourself. This drug decreases your body's ability to fight infections. Try to avoid being around people who are sick. This medicine may increase your risk to bruise or bleed. Call your doctor or health care professional if you notice any unusual bleeding. Be careful brushing and flossing your teeth or using a toothpick because you may   get an infection or bleed more easily. If you have any dental work done, tell your dentist you are receiving this medicine. Avoid taking products that contain aspirin, acetaminophen, ibuprofen, naproxen, or  ketoprofen unless instructed by your doctor. These medicines may hide a fever. Do not become pregnant while taking this medicine or for 6 months after stopping it. Women should inform their doctor if they wish to become pregnant or think they might be pregnant. Men should not father a child while taking this medicine or for 3 months after stopping it. There is a potential for serious side effects to an unborn child. Talk to your health care professional or pharmacist for more information. Do not breast-feed an infant while taking this medicine or for 2 weeks after stopping it. This medicine may interfere with the ability to get pregnant or to father a child. You should talk to your doctor or health care professional if you are concerned about your fertility. What side effects may I notice from receiving this medicine? Side effects that you should report to your doctor or health care professional as soon as possible:  allergic reactions like skin rash, itching or hives, swelling of the face, lips, or tongue  breathing problems  changes in vision  fast, irregular heartbeat  low blood pressure  mouth sores  pain, tingling, numbness in the hands or feet  signs of decreased platelets or bleeding - bruising, pinpoint red spots on the skin, black, tarry stools, blood in the urine  signs of decreased red blood cells - unusually weak or tired, feeling faint or lightheaded, falls  signs of infection - fever or chills, cough, sore throat, pain or difficulty passing urine  signs and symptoms of liver injury like dark yellow or brown urine; general ill feeling or flu-like symptoms; light-colored stools; loss of appetite; nausea; right upper belly pain; unusually weak or tired; yellowing of the eyes or skin  swelling of the ankles, feet, hands  unusually slow heartbeat Side effects that usually do not require medical attention (report to your doctor or health care professional if they continue or  are bothersome):  diarrhea  hair loss  loss of appetite  nausea, vomiting  tiredness This list may not describe all possible side effects. Call your doctor for medical advice about side effects. You may report side effects to FDA at 1-800-FDA-1088. Where should I keep my medicine? This drug is given in a hospital or clinic and will not be stored at home. NOTE: This sheet is a summary. It may not cover all possible information. If you have questions about this medicine, talk to your doctor, pharmacist, or health care provider.  2020 Elsevier/Gold Standard (2016-11-04 13:03:45)  Gemcitabine injection What is this medicine? GEMCITABINE (jem SYE ta been) is a chemotherapy drug. This medicine is used to treat many types of cancer like breast cancer, lung cancer, pancreatic cancer, and ovarian cancer. This medicine may be used for other purposes; ask your health care provider or pharmacist if you have questions. COMMON BRAND NAME(S): Gemzar, Infugem What should I tell my health care provider before I take this medicine? They need to know if you have any of these conditions:  blood disorders  infection  kidney disease  liver disease  lung or breathing disease, like asthma  recent or ongoing radiation therapy  an unusual or allergic reaction to gemcitabine, other chemotherapy, other medicines, foods, dyes, or preservatives  pregnant or trying to get pregnant  breast-feeding How should I use this   medicine? This drug is given as an infusion into a vein. It is administered in a hospital or clinic by a specially trained health care professional. Talk to your pediatrician regarding the use of this medicine in children. Special care may be needed. Overdosage: If you think you have taken too much of this medicine contact a poison control center or emergency room at once. NOTE: This medicine is only for you. Do not share this medicine with others. What if I miss a dose? It is  important not to miss your dose. Call your doctor or health care professional if you are unable to keep an appointment. What may interact with this medicine?  medicines to increase blood counts like filgrastim, pegfilgrastim, sargramostim  some other chemotherapy drugs like cisplatin  vaccines Talk to your doctor or health care professional before taking any of these medicines:  acetaminophen  aspirin  ibuprofen  ketoprofen  naproxen This list may not describe all possible interactions. Give your health care provider a list of all the medicines, herbs, non-prescription drugs, or dietary supplements you use. Also tell them if you smoke, drink alcohol, or use illegal drugs. Some items may interact with your medicine. What should I watch for while using this medicine? Visit your doctor for checks on your progress. This drug may make you feel generally unwell. This is not uncommon, as chemotherapy can affect healthy cells as well as cancer cells. Report any side effects. Continue your course of treatment even though you feel ill unless your doctor tells you to stop. In some cases, you may be given additional medicines to help with side effects. Follow all directions for their use. Call your doctor or health care professional for advice if you get a fever, chills or sore throat, or other symptoms of a cold or flu. Do not treat yourself. This drug decreases your body's ability to fight infections. Try to avoid being around people who are sick. This medicine may increase your risk to bruise or bleed. Call your doctor or health care professional if you notice any unusual bleeding. Be careful brushing and flossing your teeth or using a toothpick because you may get an infection or bleed more easily. If you have any dental work done, tell your dentist you are receiving this medicine. Avoid taking products that contain aspirin, acetaminophen, ibuprofen, naproxen, or ketoprofen unless instructed by  your doctor. These medicines may hide a fever. Do not become pregnant while taking this medicine or for 6 months after stopping it. Women should inform their doctor if they wish to become pregnant or think they might be pregnant. Men should not father a child while taking this medicine and for 3 months after stopping it. There is a potential for serious side effects to an unborn child. Talk to your health care professional or pharmacist for more information. Do not breast-feed an infant while taking this medicine or for at least 1 week after stopping it. Men should inform their doctors if they wish to father a child. This medicine may lower sperm counts. Talk with your doctor or health care professional if you are concerned about your fertility. What side effects may I notice from receiving this medicine? Side effects that you should report to your doctor or health care professional as soon as possible:  allergic reactions like skin rash, itching or hives, swelling of the face, lips, or tongue  breathing problems  pain, redness, or irritation at site where injected  signs and symptoms of a dangerous   change in heartbeat or heart rhythm like chest pain; dizziness; fast or irregular heartbeat; palpitations; feeling faint or lightheaded, falls; breathing problems  signs of decreased platelets or bleeding - bruising, pinpoint red spots on the skin, black, tarry stools, blood in the urine  signs of decreased red blood cells - unusually weak or tired, feeling faint or lightheaded, falls  signs of infection - fever or chills, cough, sore throat, pain or difficulty passing urine  signs and symptoms of kidney injury like trouble passing urine or change in the amount of urine  signs and symptoms of liver injury like dark yellow or brown urine; general ill feeling or flu-like symptoms; light-colored stools; loss of appetite; nausea; right upper belly pain; unusually weak or tired; yellowing of the eyes or  skin  swelling of ankles, feet, hands Side effects that usually do not require medical attention (report to your doctor or health care professional if they continue or are bothersome):  constipation  diarrhea  hair loss  loss of appetite  nausea  rash  vomiting This list may not describe all possible side effects. Call your doctor for medical advice about side effects. You may report side effects to FDA at 1-800-FDA-1088. Where should I keep my medicine? This drug is given in a hospital or clinic and will not be stored at home. NOTE: This sheet is a summary. It may not cover all possible information. If you have questions about this medicine, talk to your doctor, pharmacist, or health care provider.  2020 Elsevier/Gold Standard (2017-05-27 18:06:11)   

## 2018-11-26 NOTE — Transfer of Care (Signed)
Immediate Anesthesia Transfer of Care Note  Patient: Melissa [name]  Procedure(s) Performed: INSERTION PORT-A-CATH (Right )  Patient Location: PACU  Anesthesia Type:General  Level of Consciousness: awake and patient cooperative  Airway & Oxygen Therapy: Patient Spontanous Breathing and Patient connected to face mask oxygen  Post-op Assessment: Report given to RN and Post -op Vital signs reviewed and stable  Post vital signs: stable  Last Vitals:  Vitals Value Taken Time  BP 125/59 11/26/18 1345  Temp 36.9 C 11/26/18 1345  Pulse 81 11/26/18 1347  Resp 15 11/26/18 1347  SpO2 100 % 11/26/18 1347  Vitals shown include unvalidated device data.  Last Pain:  Vitals:   11/26/18 1048  PainSc: 0-No pain         Complications: No apparent anesthesia complications

## 2018-11-26 NOTE — Anesthesia Post-op Follow-up Note (Signed)
Anesthesia QCDR form completed.        

## 2018-11-26 NOTE — Anesthesia Preprocedure Evaluation (Addendum)
Anesthesia Evaluation  Patient identified by MRN, date of birth, ID band Patient awake    Reviewed: Allergy & Precautions, H&P , NPO status , Patient's Chart, lab work & pertinent test results  History of Anesthesia Complications Negative for: history of anesthetic complications  Airway Mallampati: III  TM Distance: <3 FB Neck ROM: limited    Dental  (+) Chipped   Pulmonary neg pulmonary ROS, neg shortness of breath,           Cardiovascular Exercise Tolerance: Good hypertension, (-) angina(-) Past MI and (-) DOE      Neuro/Psych CVA, Residual Symptoms negative psych ROS   GI/Hepatic negative GI ROS, Neg liver ROS, neg GERD  ,  Endo/Other  negative endocrine ROS  Renal/GU negative Renal ROS  negative genitourinary   Musculoskeletal   Abdominal   Peds  Hematology negative hematology ROS (+)   Anesthesia Other Findings Past Medical History: No date: Glaucoma No date: Hypertension No date: Osteopenia No date: Stroke [facility])  Past Surgical History: No date: BREAST BIOPSY; Right     Comment:  core- stereo- neg 10/29/2018: ERCP; N/A     Comment:  Procedure: ENDOSCOPIC RETROGRADE               CHOLANGIOPANCREATOGRAPHY (ERCP);  Surgeon: [name],               MD;  Location: [facility] ENDOSCOPY;  Service: Endoscopy;                Laterality: N/A; 01/02/2016: HIP ARTHROPLASTY; Left     Comment:  Procedure: ARTHROPLASTY BIPOLAR HIP (HEMIARTHROPLASTY);               Surgeon: Dereck Leep, MD;  Location: [facility] ORS;                Service: Orthopedics;  Laterality: Left; No date: TONSILLECTOMY  BMI    Body Mass Index: 27.96 kg/m      Reproductive/Obstetrics negative OB ROS                             Anesthesia Physical  Anesthesia Plan  ASA: III  Anesthesia Plan: General   Post-op Pain Management:    Induction: Intravenous  PONV Risk Score and Plan: TIVA and Propofol  infusion  Airway Management Planned: Nasal Cannula  Additional Equipment:   Intra-op Plan:   Post-operative Plan:   Informed Consent: I have reviewed the patients History and Physical, chart, labs and discussed the procedure including the risks, benefits and alternatives for the proposed anesthesia with the patient or authorized representative who has indicated his/her understanding and acceptance.     Dental Advisory Given  Plan Discussed with: Anesthesiologist, CRNA and Surgeon  Anesthesia Plan Comments: (Patient consented for risks of anesthesia including but not limited to:  - adverse reactions to medications  - damage to teeth, lips or other oral mucosa - sore throat or hoarseness - Damage to heart, brain, lungs or loss of life  Patient voiced understanding.)       Anesthesia Quick Evaluation

## 2018-11-26 NOTE — Discharge Instructions (Addendum)

## 2018-11-28 ENCOUNTER — Encounter: Payer: Self-pay | Admitting: Surgery

## 2018-11-29 ENCOUNTER — Telehealth: Payer: Self-pay

## 2018-11-29 ENCOUNTER — Inpatient Hospital Stay: Payer: Medicare Other

## 2018-11-29 ENCOUNTER — Other Ambulatory Visit: Payer: Self-pay

## 2018-11-29 ENCOUNTER — Encounter: Payer: Self-pay | Admitting: Oncology

## 2018-11-29 DIAGNOSIS — H409 Unspecified glaucoma: Secondary | ICD-10-CM | POA: Insufficient documentation

## 2018-11-29 DIAGNOSIS — I10 Essential (primary) hypertension: Secondary | ICD-10-CM | POA: Insufficient documentation

## 2018-11-29 DIAGNOSIS — R739 Hyperglycemia, unspecified: Secondary | ICD-10-CM | POA: Insufficient documentation

## 2018-11-29 DIAGNOSIS — E0590 Thyrotoxicosis, unspecified without thyrotoxic crisis or storm: Secondary | ICD-10-CM | POA: Insufficient documentation

## 2018-11-29 DIAGNOSIS — M8580 Other specified disorders of bone density and structure, unspecified site: Secondary | ICD-10-CM | POA: Insufficient documentation

## 2018-11-29 DIAGNOSIS — E785 Hyperlipidemia, unspecified: Secondary | ICD-10-CM | POA: Insufficient documentation

## 2018-11-29 LAB — CYTOLOGY - NON PAP

## 2018-11-29 NOTE — Telephone Encounter (Signed)
Nutrition Assessment:  Patient identified on Malnutrition Screening report for weight loss and poor appetite.   78 year old female with stage IV pancreatic cancer. Patient planning to start palliative chemotherapy tomorrow with gemcitabine and abraxane.  Past medical history reviewed.  Spoke with patient via phone to introduce self and service at cancer center.  Patient reports appetite has been decreased over the last 3 weeks.  Reports that she busy taking care of 63 year old mother (recently past away) and husband (in 73s with back trouble) and eating a bite or two at a time.  Her friend told her that she was yellow looking and started the work-up.  Noted hospital admission.  Reports after the stent was placed her diarrhea has resolved. Reports that family and friends have been bringing food recently.  Drank ensure this am for breakfast.  Lunch was few bites of baked chicken and baked beans.  Reports chicken pie will be for supper.  Had baked apples and sweet potato casserole and deviled eggs yesterday.      Medications: reviewed  Labs: K 3.3  Anthropometrics:   Height: 61 inches Weight: 144 lb 14.4 oz on 9/11 Noted 150 lb on 10/29/2018 BMI: 27  4% weight loss in the last month.     Estimated Energy Needs  Kcals: 1625-1950 Protein: 81-98 g Fluid: 1.9 L  NUTRITION DIAGNOSIS: Inadequate oral intake related to cancer as evidenced by poor appetite and 4% weight loss in the last month   INTERVENTION:  Discussed strategies to increase calories and protein.  Will mail handout. Encouraged higher calorie shake BID for additional calories and protein Contact information provided.      MONITORING, EVALUATION, GOAL: Patient will consume adequate calories to maintain weight    NEXT VISIT: Monday, Sept 28 phone f/u   Chigozie Basaldua B. Zenia Resides, [place], [place] Registered Dietitian [phone] (pager)

## 2018-11-29 NOTE — Telephone Encounter (Signed)
Received message from pathology that MMR testing could not be interpreted due to insufficient tissue. Dr. Tasia Catchings made aware.

## 2018-11-29 NOTE — Progress Notes (Signed)
Patient stated that she had lost her appetite and that food is not tasting too good.

## 2018-11-30 ENCOUNTER — Encounter: Payer: Self-pay | Admitting: Oncology

## 2018-11-30 ENCOUNTER — Inpatient Hospital Stay (HOSPITAL_BASED_OUTPATIENT_CLINIC_OR_DEPARTMENT_OTHER): Payer: Medicare Other | Admitting: Oncology

## 2018-11-30 ENCOUNTER — Other Ambulatory Visit: Payer: Self-pay

## 2018-11-30 ENCOUNTER — Inpatient Hospital Stay: Payer: Medicare Other | Admitting: *Deleted

## 2018-11-30 ENCOUNTER — Inpatient Hospital Stay: Payer: Medicare Other

## 2018-11-30 VITALS — BP 119/65 | HR 87 | Temp 97.6°F | Resp 18 | Wt 145.0 lb

## 2018-11-30 DIAGNOSIS — Z95828 Presence of other vascular implants and grafts: Secondary | ICD-10-CM

## 2018-11-30 DIAGNOSIS — E876 Hypokalemia: Secondary | ICD-10-CM | POA: Diagnosis not present

## 2018-11-30 DIAGNOSIS — C250 Malignant neoplasm of head of pancreas: Secondary | ICD-10-CM

## 2018-11-30 DIAGNOSIS — Z7189 Other specified counseling: Secondary | ICD-10-CM | POA: Diagnosis not present

## 2018-11-30 DIAGNOSIS — D72829 Elevated white blood cell count, unspecified: Secondary | ICD-10-CM

## 2018-11-30 DIAGNOSIS — D490 Neoplasm of unspecified behavior of digestive system: Secondary | ICD-10-CM

## 2018-11-30 DIAGNOSIS — G893 Neoplasm related pain (acute) (chronic): Secondary | ICD-10-CM

## 2018-11-30 DIAGNOSIS — D649 Anemia, unspecified: Secondary | ICD-10-CM

## 2018-11-30 DIAGNOSIS — Z5111 Encounter for antineoplastic chemotherapy: Secondary | ICD-10-CM | POA: Diagnosis present

## 2018-11-30 LAB — COMPREHENSIVE METABOLIC PANEL
ALT: 21 U/L (ref 0–44)
AST: 40 U/L (ref 15–41)
Albumin: 3 g/dL — ABNORMAL LOW (ref 3.5–5.0)
Alkaline Phosphatase: 293 U/L — ABNORMAL HIGH (ref 38–126)
Anion gap: 11 (ref 5–15)
BUN: 13 mg/dL (ref 8–23)
CO2: 26 mmol/L (ref 22–32)
Calcium: 8.9 mg/dL (ref 8.9–10.3)
Chloride: 102 mmol/L (ref 98–111)
Creatinine, Ser: 0.62 mg/dL (ref 0.44–1.00)
GFR calc Af Amer: >60 mL/min (ref >60)
GFR calc non Af Amer: >60 mL/min (ref >60)
Glucose, Bld: 221 mg/dL — ABNORMAL HIGH (ref 70–99)
Potassium: 3.2 mmol/L — ABNORMAL LOW (ref 3.5–5.1)
Sodium: 139 mmol/L (ref 135–145)
Total Bilirubin: 1.4 mg/dL — ABNORMAL HIGH (ref 0.3–1.2)
Total Protein: 7.4 g/dL (ref 6.5–8.1)

## 2018-11-30 LAB — CBC WITH DIFFERENTIAL/PLATELET
Abs Immature Granulocytes: 0.11 10*3/uL — ABNORMAL HIGH (ref 0.00–0.07)
Basophils Absolute: 0 10*3/uL (ref 0.0–0.1)
Basophils Relative: 0 %
Eosinophils Absolute: 0.1 10*3/uL (ref 0.0–0.5)
Eosinophils Relative: 1 %
HCT: 33 % — ABNORMAL LOW (ref 36.0–46.0)
Hemoglobin: 10.9 g/dL — ABNORMAL LOW (ref 12.0–15.0)
Immature Granulocytes: 1 %
Lymphocytes Relative: 5 %
Lymphs Abs: 0.8 10*3/uL (ref 0.7–4.0)
MCH: 33.2 pg (ref 26.0–34.0)
MCHC: 33 g/dL (ref 30.0–36.0)
MCV: 100.6 fL — ABNORMAL HIGH (ref 80.0–100.0)
Monocytes Absolute: 0.9 10*3/uL (ref 0.1–1.0)
Monocytes Relative: 6 %
Neutro Abs: 13 10*3/uL — ABNORMAL HIGH (ref 1.7–7.7)
Neutrophils Relative %: 87 %
Platelets: 377 10*3/uL (ref 150–400)
RBC: 3.28 MIL/uL — ABNORMAL LOW (ref 3.87–5.11)
RDW: 14.9 % (ref 11.5–15.5)
WBC: 15 10*3/uL — ABNORMAL HIGH (ref 4.0–10.5)
nRBC: 0 % (ref 0.0–0.2)

## 2018-11-30 MED ORDER — OXYCODONE HCL 5 MG PO TABS: 5.0000 mg | ORAL_TABLET | Freq: Four times a day (QID) | ORAL | 0 refills | Status: DC | PRN

## 2018-11-30 MED ORDER — SODIUM CHLORIDE 0.9 % IV SOLN
Freq: Once | INTRAVENOUS | Status: AC
  Administered 2018-11-30: 10:00:00 via INTRAVENOUS
  Filled 2018-11-30: qty 250

## 2018-11-30 MED ORDER — POTASSIUM CHLORIDE 20 MEQ/100ML IV SOLN
20.0000 meq | Freq: Once | INTRAVENOUS | Status: AC
  Administered 2018-11-30: 11:00:00 20 meq via INTRAVENOUS

## 2018-11-30 MED ORDER — HEPARIN SOD (PORK) LOCK FLUSH 100 UNIT/ML IV SOLN
500.0000 [IU] | Freq: Once | INTRAVENOUS | Status: AC | PRN
  Administered 2018-11-30: 13:00:00 500 [IU]
  Filled 2018-11-30: qty 5

## 2018-11-30 MED ORDER — SODIUM CHLORIDE 0.9 % IV SOLN
1600.0000 mg | Freq: Once | INTRAVENOUS | Status: AC
  Administered 2018-11-30: 13:00:00 1600 mg via INTRAVENOUS
  Filled 2018-11-30: qty 26.3

## 2018-11-30 MED ORDER — PROCHLORPERAZINE MALEATE 10 MG PO TABS
10.0000 mg | ORAL_TABLET | Freq: Once | ORAL | Status: AC
  Administered 2018-11-30: 10 mg via ORAL
  Filled 2018-11-30: qty 1

## 2018-11-30 MED ORDER — SODIUM CHLORIDE 0.9% FLUSH
10.0000 mL | Freq: Once | INTRAVENOUS | Status: AC
  Administered 2018-11-30: 10 mL via INTRAVENOUS
  Filled 2018-11-30: qty 10

## 2018-11-30 MED ORDER — PACLITAXEL PROTEIN-BOUND CHEMO INJECTION 100 MG
115.0000 mg/m2 | Freq: Once | INTRAVENOUS | Status: AC
  Administered 2018-11-30: 12:00:00 200 mg via INTRAVENOUS
  Filled 2018-11-30: qty 40

## 2018-11-30 MED ORDER — DOCUSATE SODIUM 100 MG PO CAPS: 100.0000 mg | ORAL_CAPSULE | Freq: Every day | ORAL | 2 refills | Status: DC | PRN

## 2018-11-30 NOTE — Progress Notes (Signed)
Hematology/Oncology follow up note [facility] [phone] [phone]   Patient Care Team: Adin Hector, MD as PCP - General (Internal Medicine) Clent Jacks, RN as Oncology Nurse Navigator  REFERRING PROVIDER: Adin Hector, MD  CHIEF COMPLAINTS/REASON FOR VISIT:  Follow-up for pancreatic mass.  HISTORY OF PRESENTING ILLNESS:   Melissa [name] is a  78 y.o.  female with PMH listed below was seen in consultation at the request of  Adin Hector, MD  for evaluation of abnormal pancreatic disease. Patient was recently seen by primary care provider Dr. Caryl Comes for evaluation of nausea, diarrhea and jaundice.  Blood work on 10/19/2018 showed potassium 2.9, bilirubin 15, alkaline phosphatase 478, AST 114, ALT 95. Abdomen pelvis CT scan on 10/20/2018 showed moderate to market intra-and extra hepatic biliary dilation with mild diffuse dilatation of the pancreatic duct. Patient has had poor appetite and has lost a 5 to 10 pounds recently. She also had diarrhea.  She noticed sand like stool for 1- 2 weeks.  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  She placed her mother to assisted living yesterday. She has had discussion with Dr. Caryl Comes about her blood work and CT scans.  She understands that there is a strong suspicion of cancer.  Nonessential medication has been stopped. CA 19-9 was check on 10/21/2018, level was elevated at 1500.  Today she denies any pain.  Itchy all over her body.. Per patient's request, patient's daughter [name] was [name] and [name] was able to hear entire clinical encounter conversation and participate in the reported history and discussion.  # Patient was admitted from 10/29/2018-10/31/2018.  Status post ERCP and biliary stenting.  Patient had duodenum ampulla biopsy showed nondiagnostic.  Negative for  invasive carcinoma. Bilirubin trended down to 11 at the time of discharge on 10/31/2018.  #PET scan images were independent reviewed by me and discussed with patient. cTxN2 M1a Patient has at least M1 a disease due to retroperitoneal/peri-aortic lymph node involvement,  sclerotic lesion of right proximal femur with mild hypermetabolic activity, as well as left sacrum  # EUS biopsy of pancreatic mass showed positive for malignancy, Adenocarcinoma. There is not enough tissue for NGS testing.  Attempt to at MMR, still not enough tissue to complete the testing.  INTERVAL HISTORY Melissa [name] is a 78 y.o. female who has above history reviewed by me today presents for follow up visit for management of stage IV pancreatic cancer During the interval patient has had Mediport placed by Dr. Perrin Maltese.  She reports intermittent right mid abdominal discomfort, rated as 5-6 out of 10.  No exacerbating or alleviating factors.  She takes Tylenol as needed. Denies nausea vomiting. Continues to feel fatigued and tired.  she has attended chemotherapy class and has antiemetics medications.   Review of Systems  Constitutional: Positive for appetite change, fatigue and unexpected weight change. Negative for chills and fever.  HENT:   Negative for hearing loss and voice change.   Eyes: Negative for eye problems.  Respiratory: Negative for chest tightness and cough.   Cardiovascular: Negative for chest pain.  Gastrointestinal: Positive for abdominal pain. Negative for abdominal distention and blood in stool.  Endocrine: Negative for hot flashes.  Genitourinary: Negative for difficulty urinating and frequency.   Musculoskeletal: Negative for arthralgias.  Skin: Negative for itching and rash.  Jaundice has improved.  Neurological: Negative for extremity weakness.  Hematological: Negative for adenopathy.  Psychiatric/Behavioral: Negative for confusion.    MEDICAL HISTORY:  Past Medical History:    Diagnosis Date   Glaucoma    Hypertension    Osteopenia    Stroke [facility])     SURGICAL HISTORY: Past Surgical History:  Procedure Laterality Date   BREAST BIOPSY Right    core- stereo- neg   ERCP N/A 10/29/2018   Procedure: ENDOSCOPIC RETROGRADE CHOLANGIOPANCREATOGRAPHY (ERCP);  Surgeon: [name], MD;  Location: [facility] ENDOSCOPY;  Service: Endoscopy;  Laterality: N/A;   EUS N/A 11/18/2018   Procedure: FULL UPPER ENDOSCOPIC ULTRASOUND (EUS) RADIAL;  Surgeon: Holly Bodily, MD;  Location: [facility] ENDOSCOPY;  Service: Gastroenterology;  Laterality: N/A;   HIP ARTHROPLASTY Left 01/02/2016   Procedure: ARTHROPLASTY BIPOLAR HIP (HEMIARTHROPLASTY);  Surgeon: Dereck Leep, MD;  Location: [facility] ORS;  Service: Orthopedics;  Laterality: Left;   JOINT REPLACEMENT Left    THR   PORTACATH PLACEMENT Right 11/26/2018   Procedure: INSERTION PORT-A-CATH;  Surgeon: Jules Husbands, MD;  Location: [facility] ORS;  Service: General;  Laterality: Right;   TONSILLECTOMY      SOCIAL HISTORY: Social History   Socioeconomic History   Marital status: Married    Spouse name: Not on file   Number of children: Not on file   Years of education: Not on file   Highest education level: Not on file  Occupational History   Not on file  Social Needs   Financial resource strain: Not on file   Food insecurity    Worry: Not on file    Inability: Not on file   Transportation needs    Medical: Not on file    Non-medical: Not on file  Tobacco Use   Smoking status: Never Smoker   Smokeless tobacco: Never Used  Substance and Sexual Activity   Alcohol use: No   Drug use: Never   Sexual activity: Not on file  Lifestyle   Physical activity    Days per week: Not on file    Minutes per session: Not on file   Stress: Not on file  Relationships   Social connections    Talks on phone: Not on file    Gets together: Not on file    Attends religious service: Not on file    Active member  of club or organization: Not on file    Attends meetings of clubs or organizations: Not on file    Relationship status: Not on file   Intimate partner violence    Fear of current or ex partner: Not on file    Emotionally abused: Not on file    Physically abused: Not on file    Forced sexual activity: Not on file  Other Topics Concern   Not on file  Social History Narrative   Not on file    FAMILY HISTORY: Family History  Problem Relation Age of Onset   Kidney disease Father    Breast cancer Neg Hx     ALLERGIES:  has No Known Allergies.  MEDICATIONS:  Current Outpatient Medications  Medication Sig Dispense Refill   acetaminophen (TYLENOL) 500 MG tablet Take 500 mg by mouth every 6 (six) hours as needed.     amLODipine (NORVASC) 10 MG tablet Take 10 mg by mouth at bedtime.      lidocaine-prilocaine (EMLA) cream Apply to affected area once 30 g 3   docusate sodium (COLACE) 100 MG capsule Take  1 capsule (100 mg total) by mouth daily as needed for mild constipation or moderate constipation. Do not take if you have loose stools 60 capsule 2   ondansetron (ZOFRAN) 8 MG tablet Take 1 tablet by mouth as needed.     oxyCODONE (ROXICODONE) 5 MG immediate release tablet Take 1 tablet (5 mg total) by mouth every 6 (six) hours as needed for moderate pain or severe pain. 60 tablet 0   prochlorperazine (COMPAZINE) 10 MG tablet Take 1 tablet by mouth as needed.     No current facility-administered medications for this visit.      PHYSICAL EXAMINATION: ECOG PERFORMANCE STATUS: 1 - Symptomatic but completely ambulatory Vitals:   11/30/18 0937  BP: 119/65  Pulse: 87  Resp: 18  Temp: 97.6 F (36.4 C)   Filed Weights   11/30/18 0937  Weight: 145 lb (65.8 kg)    Physical Exam Constitutional:      General: She is not in acute distress. HENT:     Head: Normocephalic and atraumatic.  Eyes:     General: No scleral icterus.    Pupils: Pupils are equal, round, and reactive  to light.  Neck:     Musculoskeletal: Normal range of motion and neck supple.  Cardiovascular:     Rate and Rhythm: Normal rate and regular rhythm.     Heart sounds: Normal heart sounds.  Pulmonary:     Effort: Pulmonary effort is normal. No respiratory distress.     Breath sounds: Normal breath sounds. No wheezing.  Abdominal:     General: Bowel sounds are normal. There is distension.     Palpations: Abdomen is soft. There is no mass.     Tenderness: There is no abdominal tenderness.  Musculoskeletal: Normal range of motion.        General: No deformity.  Skin:    General: Skin is warm and dry.     Coloration: Skin is not jaundiced.     Findings: No erythema or rash.  Neurological:     Mental Status: She is alert and oriented to person, place, and time.     Cranial Nerves: No cranial nerve deficit.     Coordination: Coordination normal.  Psychiatric:        Behavior: Behavior normal.        Thought Content: Thought content normal.     LABORATORY DATA:  I have reviewed the data as listed Lab Results  Component Value Date   WBC 15.0 (H) 11/30/2018   HGB 10.9 (L) 11/30/2018   HCT 33.0 (L) 11/30/2018   MCV 100.6 (H) 11/30/2018   PLT 377 11/30/2018   Recent Labs    10/29/18 1318 10/30/18 0531 10/31/18 0543 11/30/18 0925  NA 135 138 137 139  K 3.0* 3.2* 3.3* 3.2*  CL 104 110 109 102  CO2 21* 20* 22 26  GLUCOSE 110* 114* 114* 221*  BUN '13 14 11 13  ' CREATININE 0.32* UNABLE TO REPORT DUE TO ICTERUS 0.33* 0.62  CALCIUM 9.0 8.0* 8.1* 8.9  GFRNONAA >60 NOT CALCULATED >60 >60  GFRAA >60 NOT CALCULATED >60 >60  PROT 7.0 5.9* 5.8* 7.4  ALBUMIN 2.6* 2.0* 2.0* 3.0*  AST 119* 91* 88* 40  ALT 79* UNABLE TO REPORT DUE TO ICTERUS 55* 21  ALKPHOS 399* 344* 328* 293*  BILITOT 18.2* 14.7* 11.0* 1.4*  BILIDIR 10.2*  --   --   --    Iron/TIBC/Ferritin/ %Sat No results found for: IRON, TIBC, FERRITIN, IRONPCTSAT  RADIOGRAPHIC STUDIES: I have personally reviewed the  radiological images as listed and agreed with the findings in the report. Mr Jeri Cos Wo Contrast  Result Date: 11/26/2018 CLINICAL DATA:  History of pancreas cancer with new onset seizure EXAM: MRI HEAD WITHOUT AND WITH CONTRAST TECHNIQUE: Multiplanar, multiecho pulse sequences of the brain and surrounding structures were obtained without and with intravenous contrast. CONTRAST:  31m GADAVIST GADOBUTROL 1 MMOL/ML IV SOLN COMPARISON:  08/06/2006 FINDINGS: Brain: No focal cortical finding to explain seizure. No mass or swelling to suggest metastatic disease. Remote lacunar infarct in the left thalamus. Cerebral atrophy with nonspecific pattern. Mild small vessel ischemic gliosis in the white matter. No hydrocephalus or collection. Vascular: Major flow voids and vascular enhancements are preserved Skull and upper cervical spine: Negative for marrow lesion. Cervical facet spurring. Sinuses/Orbits: Negative IMPRESSION: No evidence of metastatic disease and no cortical finding to explain seizure. Electronically Signed   By: JMonte FantasiaM.D.   On: 11/26/2018 11:34   Nm Pet Image Initial (pi) Skull Base To Thigh  Result Date: 11/09/2018 CLINICAL DATA:  Initial treatment strategy for pancreatic mass. EXAM: NUCLEAR MEDICINE PET SKULL BASE TO THIGH TECHNIQUE: 8.4 mCi F-18 FDG was injected intravenously. Full-ring PET imaging was performed from the skull base to thigh after the radiotracer. CT data was obtained and used for attenuation correction and anatomic localization. Fasting blood glucose: 131 mg/dl COMPARISON:  CT abdomen 10/20/2018 FINDINGS: Mediastinal blood pool activity: SUV max 2.3 Liver activity: SUV max 3.5 NECK: No significant abnormal hypermetabolic activity in this region. Incidental CT findings: Chronic right maxillary sinusitis. CHEST: No significant abnormal hypermetabolic activity in this region. Incidental CT findings: Atherosclerotic calcification of the aortic arch and branch vessels.  ABDOMEN/PELVIS: Abnormal accentuated activity in the pancreatic head, maximum SUV 8.7, high suspicion for pancreatic head malignancy. The biliary stent passes through this region of abnormal accentuated metabolic activity. Hypermetabolic adjacent peripancreatic retroperitoneal lymph nodes. For example, a 1.3 cm peripancreatic lymph node a left periaortic lymph node measuring 1.4 cm in short axis on image 148/3 has a maximum SUV of 5.2. Other hypermetabolic porta hepatis and retroperitoneal adenopathy is also present. No hypermetabolic hepatic mass is observed. Incidental CT findings: Moderately distended gallbladder containing numerous small gallstones. Trace amount of perihepatic fluid. Mild stranding around the pancreatic head and porta hepatis region and extending into the root of the mesentery. Mild hypodensity along the pancreatic tail on image 142/3 corresponding to a small fluid density lesion shown on prior CT scan in this vicinity. SKELETON: Mild accentuated activity along the left upper sacral ala and adjacent iliac bone. The sacral ala has a maximum SUV of 4.8 with the iliac bone having a maximum SUV of 4.7. Very faint sclerosis in the medullary space of the right proximal femur associated with accentuated metabolic activity, maximum SUV 4.0, this lesion measured 1.1 cm in diameter on image 246/3. Incidental CT findings: Left hip hemiarthroplasty. Lower lumbar degenerative facet arthropathy. Thoracic spondylosis. IMPRESSION: 1. Abnormal prominence of the pancreatic head with indistinctness of associated tissue planes and abnormal hypermetabolic activity with maximum SUV up to 8.7, compatible with malignancy. Pancreatic adenocarcinoma with certainly be a top differential diagnostic consideration. 2. Surrounding peripancreatic, porta hepatis, and retroperitoneal lymph nodes are hypermetabolic indicating malignant involvement. 3. Is a faintly sclerotic 1.1 cm lesion in the right proximal femur with mild  hypermetabolic activity, maximum SUV 4.0. Uncertain significance, but the possibility of osseous metastatic disease is raised. There are also indistinctly marginated areas of accentuated metabolic  activity in the left sacral ala and adjacent iliac bone which could be inflammatory from mild sacroiliitis or due to malignancy, but these lesions do not have CT correlate. 4. Distended and mildly thick-walled gallbladder with numerous gallstones. A biliary stent is in place. Strictly speaking I cannot exclude acute cholecystitis. 5. Other imaging findings of potential clinical significance: Chronic right maxillary sinusitis. Aortic Atherosclerosis (ICD10-I70.0). Electronically Signed   By: Van Clines M.D.   On: 11/09/2018 13:22   Dg Chest Port 1 View  Result Date: 11/26/2018 CLINICAL DATA:  Porta athlete's min EXAM: PORTABLE CHEST 1 VIEW COMPARISON:  Portable exam 1426 hours compared to 01/01/2016 FINDINGS: RIGHT jugular Port-A-Cath with tip projecting over SVC. Upper normal heart size. Mediastinal contours and pulmonary vascularity normal. Subsegmental atelectasis retrocardiac LEFT lower lobe. Minimal dependent atelectasis at RIGHT base. No infiltrate, pleural effusion or pneumothorax. Multilevel endplate spur formation thoracic spine. IMPRESSION: Bibasilar atelectasis. No pneumothorax following Port-A-Cath insertion. Electronically Signed   By: Lavonia Dana M.D.   On: 11/26/2018 14:51   Dg C-arm 1-60 Min-no Report  Result Date: 11/26/2018 Fluoroscopy was utilized by the requesting physician.  No radiographic interpretation.    [facility] labs CA 19-9 was check on 10/21/2018, level was elevated at 1500.    ASSESSMENT & PLAN:  1. Malignant neoplasm of head of pancreas ([place])   2. Hypokalemia   3. Goals of care, counseling/discussion   4. Port-A-Cath in place   5. Neoplasm related pain   6. Leukocytosis, unspecified type   . # Stage IV pancreatic cancer-MRI brain showed no CNS metastasis.   Discussed with patient. Labs are reviewed and discussed with patient. Obstructive jaundice status post biliary stent placement.  Bilirubin has trended down to 1.4. Counts acceptable to proceed with cycle 1 chemotherapy gemcitabine and Abraxane. I went over details of antiemetics instruction with patient today.  #Hypokalemia potassium 3.2.  Has potassium 10 mEq tablets at home but she is not taking.  Recommend patient to start taking potassium chloride 10 mEq daily. Will give 20 mEq potassium chloride IV x1.  Neoplasm related pain, recommend patient to start trials of oxycodone 5 mg every 6 hours as needed She may use Colace 100 mg daily as needed for constipation.  Refer to palliative service to establish care. #Leukocytosis, dominantly neutrophilia likely due to recent port placement.  No signs of infection.  Continue monitor. #Anemia, hemoglobin 10.9, stable.  Continue to monitor. #Discussed about genetic testing.  Patient agrees.  Will refer today. All questions were answered. The patient knows to call the clinic with any problems questions or concerns.  cc Adin Hector, MD   Return of visit: 1 week to evaluate tolerability of chemotherapy and assessment prior to next chemotherapy. We spent sufficient time to discuss many aspect of care, questions were answered to patient's satisfaction.  Earlie Server, MD, PhD Hematology Oncology [facility] at [facility] Pager- [phone] 11/30/2018

## 2018-11-30 NOTE — Progress Notes (Signed)
Pt in for follow up and first chemo treatment.

## 2018-11-30 NOTE — Progress Notes (Signed)
Pt tolerated infusion well. Pt denies any complaints at this time. Pt stable at discharge.

## 2018-12-01 ENCOUNTER — Inpatient Hospital Stay (HOSPITAL_BASED_OUTPATIENT_CLINIC_OR_DEPARTMENT_OTHER): Payer: Medicare Other | Admitting: Oncology

## 2018-12-01 DIAGNOSIS — D490 Neoplasm of unspecified behavior of digestive system: Secondary | ICD-10-CM

## 2018-12-01 LAB — CANCER ANTIGEN 19-9: CA 19-9: 2101 U/mL — ABNORMAL HIGH (ref 0–35)

## 2018-12-02 ENCOUNTER — Other Ambulatory Visit: Payer: Self-pay

## 2018-12-02 ENCOUNTER — Ambulatory Visit (INDEPENDENT_AMBULATORY_CARE_PROVIDER_SITE_OTHER): Payer: Medicare Other | Admitting: Gastroenterology

## 2018-12-02 ENCOUNTER — Encounter: Payer: Self-pay | Admitting: Gastroenterology

## 2018-12-02 VITALS — BP 116/68 | HR 94 | Temp 98.0°F | Ht 60.0 in | Wt 144.8 lb

## 2018-12-02 DIAGNOSIS — C250 Malignant neoplasm of head of pancreas: Secondary | ICD-10-CM | POA: Diagnosis not present

## 2018-12-02 NOTE — Progress Notes (Signed)
Primary Care Physician: Adin Hector, MD  Primary Gastroenterologist:  Dr. [name]  Chief Complaint  Patient presents with   Follow up ER    HPI: Melissa [name] is a 78 y.o. female here for follow-up after having a stent placed in her CBD for a pancreatic mass.  The patient states that she has been doing very well since then.  She is being treated by oncology for metastatic pancreatic cancer.  The patient reports that after her ERCP she had some pain for a few days but is pain-free at the present time.  She denies any jaundice black stools bloody stools or dark urine.  She has been keeping up with her p.o. intake and denies any diarrhea or constipation.  Current Outpatient Medications  Medication Sig Dispense Refill   acetaminophen (TYLENOL) 500 MG tablet Take 500 mg by mouth every 6 (six) hours as needed.     amLODipine (NORVASC) 10 MG tablet Take 10 mg by mouth at bedtime.      docusate sodium (COLACE) 100 MG capsule Take 1 capsule (100 mg total) by mouth daily as needed for mild constipation or moderate constipation. Do not take if you have loose stools 60 capsule 2   lidocaine-prilocaine (EMLA) cream Apply to affected area once 30 g 3   ondansetron (ZOFRAN) 8 MG tablet Take 1 tablet by mouth as needed.     oxyCODONE (ROXICODONE) 5 MG immediate release tablet Take 1 tablet (5 mg total) by mouth every 6 (six) hours as needed for moderate pain or severe pain. 60 tablet 0   prochlorperazine (COMPAZINE) 10 MG tablet Take 1 tablet by mouth as needed.     No current facility-administered medications for this visit.     Allergies as of 12/02/2018   (No Known Allergies)    ROS:  General: Negative for anorexia, weight loss, fever, chills, fatigue, weakness. ENT: Negative for hoarseness, difficulty swallowing , nasal congestion. CV: Negative for chest pain, angina, palpitations, dyspnea on exertion, peripheral edema.  Respiratory: Negative for dyspnea at rest,  dyspnea on exertion, cough, sputum, wheezing.  GI: See history of present illness. GU:  Negative for dysuria, hematuria, urinary incontinence, urinary frequency, nocturnal urination.  Endo: Negative for unusual weight change.    Physical Examination:   BP 116/68    Pulse 94    Temp 98 F (36.7 C) (Temporal)    Ht 5' (1.524 m)    Wt 144 lb 12.8 oz (65.7 kg)    BMI 28.28 kg/m   General: Well-nourished, well-developed in no acute distress.  Eyes: No icterus. Conjunctivae pink. Mouth: Oropharyngeal mucosa moist and pink , no lesions erythema or exudate. Lungs: Clear to auscultation bilaterally. Non-labored. Heart: Regular rate and rhythm, no murmurs rubs or gallops.  Abdomen: Bowel sounds are normal, nontender, nondistended, no hepatosplenomegaly or masses, no abdominal bruits or hernia , no rebound or guarding.   Extremities: No lower extremity edema. No clubbing or deformities. Neuro: Alert and oriented x 3.  Grossly intact. Skin: Warm and dry, no jaundice.   Psych: Alert and cooperative, normal mood and affect.  Labs:    Imaging Studies: Mr Jeri Cos Wo Contrast  Result Date: 11/26/2018 CLINICAL DATA:  History of pancreas cancer with new onset seizure EXAM: MRI HEAD WITHOUT AND WITH CONTRAST TECHNIQUE: Multiplanar, multiecho pulse sequences of the brain and surrounding structures were obtained without and with intravenous contrast. CONTRAST:  67mL GADAVIST GADOBUTROL 1 MMOL/ML IV SOLN COMPARISON:  08/06/2006 FINDINGS:  Brain: No focal cortical finding to explain seizure. No mass or swelling to suggest metastatic disease. Remote lacunar infarct in the left thalamus. Cerebral atrophy with nonspecific pattern. Mild small vessel ischemic gliosis in the white matter. No hydrocephalus or collection. Vascular: Major flow voids and vascular enhancements are preserved Skull and upper cervical spine: Negative for marrow lesion. Cervical facet spurring. Sinuses/Orbits: Negative IMPRESSION: No evidence of  metastatic disease and no cortical finding to explain seizure. Electronically Signed   By: Monte Fantasia M.D.   On: 11/26/2018 11:34   Nm Pet Image Initial (pi) Skull Base To Thigh  Result Date: 11/09/2018 CLINICAL DATA:  Initial treatment strategy for pancreatic mass. EXAM: NUCLEAR MEDICINE PET SKULL BASE TO THIGH TECHNIQUE: 8.4 mCi F-18 FDG was injected intravenously. Full-ring PET imaging was performed from the skull base to thigh after the radiotracer. CT data was obtained and used for attenuation correction and anatomic localization. Fasting blood glucose: 131 mg/dl COMPARISON:  CT abdomen 10/20/2018 FINDINGS: Mediastinal blood pool activity: SUV max 2.3 Liver activity: SUV max 3.5 NECK: No significant abnormal hypermetabolic activity in this region. Incidental CT findings: Chronic right maxillary sinusitis. CHEST: No significant abnormal hypermetabolic activity in this region. Incidental CT findings: Atherosclerotic calcification of the aortic arch and branch vessels. ABDOMEN/PELVIS: Abnormal accentuated activity in the pancreatic head, maximum SUV 8.7, high suspicion for pancreatic head malignancy. The biliary stent passes through this region of abnormal accentuated metabolic activity. Hypermetabolic adjacent peripancreatic retroperitoneal lymph nodes. For example, a 1.3 cm peripancreatic lymph node a left periaortic lymph node measuring 1.4 cm in short axis on image 148/3 has a maximum SUV of 5.2. Other hypermetabolic porta hepatis and retroperitoneal adenopathy is also present. No hypermetabolic hepatic mass is observed. Incidental CT findings: Moderately distended gallbladder containing numerous small gallstones. Trace amount of perihepatic fluid. Mild stranding around the pancreatic head and porta hepatis region and extending into the root of the mesentery. Mild hypodensity along the pancreatic tail on image 142/3 corresponding to a small fluid density lesion shown on prior CT scan in this vicinity.  SKELETON: Mild accentuated activity along the left upper sacral ala and adjacent iliac bone. The sacral ala has a maximum SUV of 4.8 with the iliac bone having a maximum SUV of 4.7. Very faint sclerosis in the medullary space of the right proximal femur associated with accentuated metabolic activity, maximum SUV 4.0, this lesion measured 1.1 cm in diameter on image 246/3. Incidental CT findings: Left hip hemiarthroplasty. Lower lumbar degenerative facet arthropathy. Thoracic spondylosis. IMPRESSION: 1. Abnormal prominence of the pancreatic head with indistinctness of associated tissue planes and abnormal hypermetabolic activity with maximum SUV up to 8.7, compatible with malignancy. Pancreatic adenocarcinoma with certainly be a top differential diagnostic consideration. 2. Surrounding peripancreatic, porta hepatis, and retroperitoneal lymph nodes are hypermetabolic indicating malignant involvement. 3. Is a faintly sclerotic 1.1 cm lesion in the right proximal femur with mild hypermetabolic activity, maximum SUV 4.0. Uncertain significance, but the possibility of osseous metastatic disease is raised. There are also indistinctly marginated areas of accentuated metabolic activity in the left sacral ala and adjacent iliac bone which could be inflammatory from mild sacroiliitis or due to malignancy, but these lesions do not have CT correlate. 4. Distended and mildly thick-walled gallbladder with numerous gallstones. A biliary stent is in place. Strictly speaking I cannot exclude acute cholecystitis. 5. Other imaging findings of potential clinical significance: Chronic right maxillary sinusitis. Aortic Atherosclerosis (ICD10-I70.0). Electronically Signed   By: Van Clines M.D.   On:  11/09/2018 13:22   Dg Chest Port 1 View  Result Date: 11/26/2018 CLINICAL DATA:  Porta athlete's min EXAM: PORTABLE CHEST 1 VIEW COMPARISON:  Portable exam 1426 hours compared to 01/01/2016 FINDINGS: RIGHT jugular Port-A-Cath with  tip projecting over SVC. Upper normal heart size. Mediastinal contours and pulmonary vascularity normal. Subsegmental atelectasis retrocardiac LEFT lower lobe. Minimal dependent atelectasis at RIGHT base. No infiltrate, pleural effusion or pneumothorax. Multilevel endplate spur formation thoracic spine. IMPRESSION: Bibasilar atelectasis. No pneumothorax following Port-A-Cath insertion. Electronically Signed   By: Lavonia Dana M.D.   On: 11/26/2018 14:51   Dg C-arm 1-60 Min-no Report  Result Date: 11/26/2018 Fluoroscopy was utilized by the requesting physician.  No radiographic interpretation.    Assessment and Plan:   Melissa [name] is a 78 y.o. y/o female who comes in today with a history of having an ERCP with a stent placed in her common bile duct because of a obstruction due to a pancreatic mass.  The patient has been told that the stents usually are left in no longer than 3 months.  The patient has been 1 month out of having the ERCP and has been recommended to have a repeat ERCP in 1-1/2 months with a plastic versus metal stent replacement at that time.  The patient has been explained the plan and agrees with it.    [name], MD. Marval Regal   Note: This dictation was prepared with Dragon dictation along with smaller phrase technology. Any transcriptional errors that result from this process are unintentional.

## 2018-12-03 NOTE — Progress Notes (Signed)
[place]  [phone] [phone]  Patient Care Team: Adin Hector, MD as PCP - General (Internal Medicine) Clent Jacks, RN as Oncology Nurse Navigator   Name of the patient: Melissa [name]  [id number]  1940-05-01   Date of visit: 12/03/18  Diagnosis-pancreatic cancer  Chief complaint/Reason for visit- Initial Meeting for [facility], preparing for starting chemotherapy  Virtual Visit via Telephone Note  I connected with [name] on 12/03/18 at 11:00 AM EDT by telephone and verified that I am speaking with the correct person using two identifiers.  Location: Patient: Home Provider: Office   I discussed the limitations, risks, security and privacy concerns of performing an evaluation and management service by telephone and the availability of in person appointments. I also discussed with the patient that there may be a patient responsible charge related to this service. The patient expressed understanding and agreed to proceed.  Heme/Onc history:  Oncology History Overview Note  Melissa [name] is a  78 y.o.  female with PMH listed below was seen in consultation at the request of  Adin Hector, MD  for evaluation of abnormal pancreatic disease. Patient was recently seen by primary care provider Dr. Caryl Comes for evaluation of nausea, diarrhea and jaundice.  Blood work on 10/19/2018 showed potassium 2.9, bilirubin 15, alkaline phosphatase 478, AST 114, ALT 95. Abdomen pelvis CT scan on 10/20/2018 showed moderate to market intra-and extra hepatic biliary dilation with mild diffuse dilatation of the pancreatic duct. Patient has had poor appetite and has lost a 5 to 10 pounds recently. She also had diarrhea.  She noticed sand like stool for 1- 2 weeks.  She takes care of her 35 year old mother and recently feels she is not able to take care of her anymore due to progressively  worsening weakness and fatigue.  She placed her mother to assisted living yesterday. She has had discussion with Dr. Caryl Comes about her blood work and CT scans.  She understands that there is a strong suspicion of cancer.  Nonessential medication has been stopped. CA 19-9 was check on 10/21/2018, level was elevated at 1500.   # Patient was admitted from 10/29/2018-10/31/2018.  Status post ERCP and biliary stenting.  Patient had duodenum ampulla biopsy showed nondiagnostic.  Negative for invasive carcinoma. Bilirubin trended down to 11 at the time of discharge on 10/31/2018.   #PET scan images were independent reviewed by me and discussed with patient. cTxN2 M1a Patient has at least M1 a disease due to retroperitoneal/peri-aortic lymph node involvement,  sclerotic lesion of right proximal femur with mild hypermetabolic activity, as well as left sacrum   # EUS biopsy of pancreatic mass showed positive for malignancy, Adenocarcinoma. There is not enough tissue for NGS testing.  Attempt to at MMR, still not enough tissue to complete the testing   Malignant neoplasm of head of pancreas ([place])  11/02/2018 Initial Diagnosis   Malignant neoplasm of head of pancreas [facility])    Chemotherapy   The patient had PACLitaxel-protein bound (ABRAXANE) chemo infusion 200 mg, 115 mg/m2 = 225 mg, Intravenous,  Once, 1 of 5 cycles  Administration: 200 mg (11/30/2018)    gemcitabine (GEMZAR) 1,600 mg in sodium chloride 0.9 % 250 mL chemo infusion, 1,710 mg, Intravenous,  Once, 1 of 5 cycles  Administration: 1,600 mg (11/30/2018)  for chemotherapy treatment.       Interval history-Ms. Melissa [name] is a 78 year old who presents to  chemo care clinic today for initial meeting in preparation for starting chemotherapy. I introduced the chemo care clinic and we discussed that the role of the clinic is to assist those who are at an increased risk of emergency room visits and/or complications during the course of chemotherapy  treatment. We discussed that the increased risk takes into account factors such as age, performance status, and co-morbidities. We also discussed that for some, this might include barriers to care such as not having a primary care provider, lack of insurance/transportation, or not being able to afford medications. We discussed that the goal of the program is to help prevent unplanned ER visits and help reduce complications during chemotherapy. We do this by discussing specific risk factors to each individual and identifying ways that we can help improve these risk factors and reduce barriers to care.   ECOG FS:1 - Symptomatic but completely ambulatory  Review of systems- Review of Systems  Constitutional: Positive for malaise/fatigue and weight loss.  Neurological: Positive for weakness.  Psychiatric/Behavioral: Positive for depression.     Current treatment-anticipate to begin gemcitabine and Abraxane in the near future.  No Known Allergies  Past Medical History:  Diagnosis Date   Glaucoma    Hypertension    Osteopenia    Stroke [facility])     Past Surgical History:  Procedure Laterality Date   BREAST BIOPSY Right    core- stereo- neg   ERCP N/A 10/29/2018   Procedure: ENDOSCOPIC RETROGRADE CHOLANGIOPANCREATOGRAPHY (ERCP);  Surgeon: [name], MD;  Location: [facility] ENDOSCOPY;  Service: Endoscopy;  Laterality: N/A;   EUS N/A 11/18/2018   Procedure: FULL UPPER ENDOSCOPIC ULTRASOUND (EUS) RADIAL;  Surgeon: Holly Bodily, MD;  Location: [facility] ENDOSCOPY;  Service: Gastroenterology;  Laterality: N/A;   HIP ARTHROPLASTY Left 01/02/2016   Procedure: ARTHROPLASTY BIPOLAR HIP (HEMIARTHROPLASTY);  Surgeon: Dereck Leep, MD;  Location: [facility] ORS;  Service: Orthopedics;  Laterality: Left;   JOINT REPLACEMENT Left    THR   PORTACATH PLACEMENT Right 11/26/2018   Procedure: INSERTION PORT-A-CATH;  Surgeon: Jules Husbands, MD;  Location: [facility] ORS;  Service: General;  Laterality: Right;     TONSILLECTOMY      Social History   Socioeconomic History   Marital status: Married    Spouse name: Not on file   Number of children: Not on file   Years of education: Not on file   Highest education level: Not on file  Occupational History   Not on file  Social Needs   Financial resource strain: Not on file   Food insecurity    Worry: Not on file    Inability: Not on file   Transportation needs    Medical: Not on file    Non-medical: Not on file  Tobacco Use   Smoking status: Never Smoker   Smokeless tobacco: Never Used  Substance and Sexual Activity   Alcohol use: No   Drug use: Never   Sexual activity: Not on file  Lifestyle   Physical activity    Days per week: Not on file    Minutes per session: Not on file   Stress: Not on file  Relationships   Social connections    Talks on phone: Not on file    Gets together: Not on file    Attends religious service: Not on file    Active member of club or organization: Not on file    Attends meetings of clubs or organizations: Not on file    Relationship  status: Not on file   Intimate partner violence    Fear of current or ex partner: Not on file    Emotionally abused: Not on file    Physically abused: Not on file    Forced sexual activity: Not on file  Other Topics Concern   Not on file  Social History Narrative   Not on file    Family History  Problem Relation Age of Onset   Kidney disease Father    Breast cancer Neg Hx      Current Outpatient Medications:    acetaminophen (TYLENOL) 500 MG tablet, Take 500 mg by mouth every 6 (six) hours as needed., Disp: , Rfl:    amLODipine (NORVASC) 10 MG tablet, Take 10 mg by mouth at bedtime. , Disp: , Rfl:    docusate sodium (COLACE) 100 MG capsule, Take 1 capsule (100 mg total) by mouth daily as needed for mild constipation or moderate constipation. Do not take if you have loose stools, Disp: 60 capsule, Rfl: 2   lidocaine-prilocaine (EMLA)  cream, Apply to affected area once, Disp: 30 g, Rfl: 3   ondansetron (ZOFRAN) 8 MG tablet, Take 1 tablet by mouth as needed., Disp: , Rfl:    oxyCODONE (ROXICODONE) 5 MG immediate release tablet, Take 1 tablet (5 mg total) by mouth every 6 (six) hours as needed for moderate pain or severe pain., Disp: 60 tablet, Rfl: 0   prochlorperazine (COMPAZINE) 10 MG tablet, Take 1 tablet by mouth as needed., Disp: , Rfl:   Physical exam: There were no vitals filed for this visit. Physical Exam   CMP Latest Ref Rng & Units 11/30/2018  Glucose 70 - 99 mg/dL 221(H)  BUN 8 - 23 mg/dL 13  Creatinine 0.44 - 1.00 mg/dL 0.62  Sodium 135 - 145 mmol/L 139  Potassium 3.5 - 5.1 mmol/L 3.2(L)  Chloride 98 - 111 mmol/L 102  CO2 22 - 32 mmol/L 26  Calcium 8.9 - 10.3 mg/dL 8.9  Total Protein 6.5 - 8.1 g/dL 7.4  Total Bilirubin 0.3 - 1.2 mg/dL 1.4(H)  Alkaline Phos 38 - 126 U/L 293(H)  AST 15 - 41 U/L 40  ALT 0 - 44 U/L 21   CBC Latest Ref Rng & Units 11/30/2018  WBC 4.0 - 10.5 K/uL 15.0(H)  Hemoglobin 12.0 - 15.0 g/dL 10.9(L)  Hematocrit 36.0 - 46.0 % 33.0(L)  Platelets 150 - 400 K/uL 377    No images are attached to the encounter.  Mr Jeri Cos Wo Contrast  Result Date: 11/26/2018 CLINICAL DATA:  History of pancreas cancer with new onset seizure EXAM: MRI HEAD WITHOUT AND WITH CONTRAST TECHNIQUE: Multiplanar, multiecho pulse sequences of the brain and surrounding structures were obtained without and with intravenous contrast. CONTRAST:  79m GADAVIST GADOBUTROL 1 MMOL/ML IV SOLN COMPARISON:  08/06/2006 FINDINGS: Brain: No focal cortical finding to explain seizure. No mass or swelling to suggest metastatic disease. Remote lacunar infarct in the left thalamus. Cerebral atrophy with nonspecific pattern. Mild small vessel ischemic gliosis in the white matter. No hydrocephalus or collection. Vascular: Major flow voids and vascular enhancements are preserved Skull and upper cervical spine: Negative for marrow  lesion. Cervical facet spurring. Sinuses/Orbits: Negative IMPRESSION: No evidence of metastatic disease and no cortical finding to explain seizure. Electronically Signed   By: JMonte FantasiaM.D.   On: 11/26/2018 11:34   Nm Pet Image Initial (pi) Skull Base To Thigh  Result Date: 11/09/2018 CLINICAL DATA:  Initial treatment strategy for pancreatic mass. EXAM:  NUCLEAR MEDICINE PET SKULL BASE TO THIGH TECHNIQUE: 8.4 mCi F-18 FDG was injected intravenously. Full-ring PET imaging was performed from the skull base to thigh after the radiotracer. CT data was obtained and used for attenuation correction and anatomic localization. Fasting blood glucose: 131 mg/dl COMPARISON:  CT abdomen 10/20/2018 FINDINGS: Mediastinal blood pool activity: SUV max 2.3 Liver activity: SUV max 3.5 NECK: No significant abnormal hypermetabolic activity in this region. Incidental CT findings: Chronic right maxillary sinusitis. CHEST: No significant abnormal hypermetabolic activity in this region. Incidental CT findings: Atherosclerotic calcification of the aortic arch and branch vessels. ABDOMEN/PELVIS: Abnormal accentuated activity in the pancreatic head, maximum SUV 8.7, high suspicion for pancreatic head malignancy. The biliary stent passes through this region of abnormal accentuated metabolic activity. Hypermetabolic adjacent peripancreatic retroperitoneal lymph nodes. For example, a 1.3 cm peripancreatic lymph node a left periaortic lymph node measuring 1.4 cm in short axis on image 148/3 has a maximum SUV of 5.2. Other hypermetabolic porta hepatis and retroperitoneal adenopathy is also present. No hypermetabolic hepatic mass is observed. Incidental CT findings: Moderately distended gallbladder containing numerous small gallstones. Trace amount of perihepatic fluid. Mild stranding around the pancreatic head and porta hepatis region and extending into the root of the mesentery. Mild hypodensity along the pancreatic tail on image 142/3  corresponding to a small fluid density lesion shown on prior CT scan in this vicinity. SKELETON: Mild accentuated activity along the left upper sacral ala and adjacent iliac bone. The sacral ala has a maximum SUV of 4.8 with the iliac bone having a maximum SUV of 4.7. Very faint sclerosis in the medullary space of the right proximal femur associated with accentuated metabolic activity, maximum SUV 4.0, this lesion measured 1.1 cm in diameter on image 246/3. Incidental CT findings: Left hip hemiarthroplasty. Lower lumbar degenerative facet arthropathy. Thoracic spondylosis. IMPRESSION: 1. Abnormal prominence of the pancreatic head with indistinctness of associated tissue planes and abnormal hypermetabolic activity with maximum SUV up to 8.7, compatible with malignancy. Pancreatic adenocarcinoma with certainly be a top differential diagnostic consideration. 2. Surrounding peripancreatic, porta hepatis, and retroperitoneal lymph nodes are hypermetabolic indicating malignant involvement. 3. Is a faintly sclerotic 1.1 cm lesion in the right proximal femur with mild hypermetabolic activity, maximum SUV 4.0. Uncertain significance, but the possibility of osseous metastatic disease is raised. There are also indistinctly marginated areas of accentuated metabolic activity in the left sacral ala and adjacent iliac bone which could be inflammatory from mild sacroiliitis or due to malignancy, but these lesions do not have CT correlate. 4. Distended and mildly thick-walled gallbladder with numerous gallstones. A biliary stent is in place. Strictly speaking I cannot exclude acute cholecystitis. 5. Other imaging findings of potential clinical significance: Chronic right maxillary sinusitis. Aortic Atherosclerosis (ICD10-I70.0). Electronically Signed   By: Van Clines M.D.   On: 11/09/2018 13:22   Dg Chest Port 1 View  Result Date: 11/26/2018 CLINICAL DATA:  Porta athlete's min EXAM: PORTABLE CHEST 1 VIEW COMPARISON:   Portable exam 1426 hours compared to 01/01/2016 FINDINGS: RIGHT jugular Port-A-Cath with tip projecting over SVC. Upper normal heart size. Mediastinal contours and pulmonary vascularity normal. Subsegmental atelectasis retrocardiac LEFT lower lobe. Minimal dependent atelectasis at RIGHT base. No infiltrate, pleural effusion or pneumothorax. Multilevel endplate spur formation thoracic spine. IMPRESSION: Bibasilar atelectasis. No pneumothorax following Port-A-Cath insertion. Electronically Signed   By: Lavonia Dana M.D.   On: 11/26/2018 14:51   Dg C-arm 1-60 Min-no Report  Result Date: 11/26/2018 Fluoroscopy was utilized by the requesting physician.  No radiographic interpretation.     Assessment and plan- Patient is a 78 y.o. female who presents to [facility] for initial meeting in preparation for starting chemotherapy for the treatment of stage IV pancreatic cancer.   1. Cancer-metastatic pancreatic cancer: Initially seen by PCP Dr. Caryl Comes for evaluation of nausea, diarrhea and jaundice.  Blood work revealed hypokalemia, hyperbilirubinemia, elevated alk phos and elevated liver enzymes.  Subsequent imaging revealed hepatic biliary dilatation with mild diffuse dilatation of the pancreatic duct.  She had poor appetite and lost 5 to 10 pounds recently.  She complained of diarrhea.  CA-19-9 was elevated at 1500.  Denies any pain.  She admitted for ERCP from 8/14 to 10/31/2018 and biliary stenting.  PET scan revealed hypermetabolic activity to retroperitoneal periaortic lymph node, sclerotic lesion of right proximal femur and left sacrum.  EUS revealed adenocarcinoma.  She recently had a port placed.   2. Chemo Care Clinic/High Risk for ER/Hospitalization during chemotherapy- We discussed the role of the chemo care clinic and identified patient specific risk factors. I discussed that patient was identified as high risk primarily based on stage of disease.  She currently has a PCP and is followed regularly.   She has health insurance through [employer] part and B supplemental through [employer].  She is married and lives with her husband at home and has a daughter who is involved in her care.  Her mother still living and was just recently placed into a nursing facility due to her inability to take care of her due to fatigue.  She has reliable transportation.  She has not required any financial support at this time.  She knows to reach out with questions or concerns.  Her past medical history is positive for: Past Medical History:  Diagnosis Date   Glaucoma    Hypertension    Osteopenia    Stroke [facility])    Her past surgical history is positive for:  Past Surgical History:  Procedure Laterality Date   BREAST BIOPSY Right    core- stereo- neg   ERCP N/A 10/29/2018   Procedure: ENDOSCOPIC RETROGRADE CHOLANGIOPANCREATOGRAPHY (ERCP);  Surgeon: [name], MD;  Location: [facility] ENDOSCOPY;  Service: Endoscopy;  Laterality: N/A;   EUS N/A 11/18/2018   Procedure: FULL UPPER ENDOSCOPIC ULTRASOUND (EUS) RADIAL;  Surgeon: Holly Bodily, MD;  Location[facility] ENDOSCOPY;  Service: Gastroenterology;  Laterality: N/A;   HIP ARTHROPLASTY Left 01/02/2016   Procedure: ARTHROPLASTY BIPOLAR HIP (HEMIARTHROPLASTY);  Surgeon: Dereck Leep, MD;  Location: [facility] ORS;  Service: Orthopedics;  Laterality: Left;   JOINT REPLACEMENT Left    THR   PORTACATH PLACEMENT Right 11/26/2018   Procedure: INSERTION PORT-A-CATH;  Surgeon: Jules Husbands, MD;  Location: [facility] ORS;  Service: General;  Laterality: Right;   TONSILLECTOMY       3. Social Determinants of Health- we discussed that social determinants of health may have significant impacts on health and outcomes for cancer patients.  Today we discussed specific social determinants of performance status, alcohol use, depression, financial needs, food insecurity, housing, interpersonal violence, social connections, stress, tobacco use, and transportation.   After lengthy discussion the following were identified as areas of need; performance status, depression and stress. We discussed options including home based and outpatient services, DME, and CARE program. We discusssed that patients who participate in regular physical activity report fewer negative impacts of cancer and treatments and report less fatigue. We discussed self-referral to [facility] for counseling services, psychiatry for medication  management, or palliative care/symptom management as well as primary care providers. We discussed that living with cancer can create tremendous financial burden.  We discussed options for assistance. I asked that if assistance is needed in affording medications or paying bills to please let us know so that we can provide assistance. We discussed options for managing stress including healthy eating, exercise as well as participating in no charge counseling services at the cancer center and support groups.  If these are of interest, patient can notify either myself or primary nursing team.  4. Co-morbidities Complicating Care:  Past Medical History:  Diagnosis Date   Glaucoma    Hypertension    Osteopenia    Stroke ([place])    5. Palliative Care- based on stage of cancer and/or identified needs today, I will refer patient to palliative care for goals of care and advanced care planning.  We also discussed the role of the Symptom Management Clinic at [facility] for acute issues and methods of contacting clinic/provider. She denies needing specific assistance at this time and She will be followed by Mariea Clonts, RN (Nurse Navigator).    Visit Diagnosis 1. Pancreatic tumor      Patient expressed understanding and was in agreement with this plan. She also understands that She can call clinic at any time with any questions, concerns, or complaints.   A total of (25) minutes of face-to-face time was spent with this patient with greater than 50% of that time in  counseling and care-coordination.  Rulon Abide, NP, AGNP-C [place] at [facility] [phone] (work cell) [phone] (office)  CC: Dr. Tasia Catchings

## 2018-12-06 ENCOUNTER — Other Ambulatory Visit: Payer: Self-pay

## 2018-12-06 ENCOUNTER — Telehealth: Payer: Self-pay

## 2018-12-06 ENCOUNTER — Encounter: Payer: Medicare Other | Admitting: Hospice and Palliative Medicine

## 2018-12-06 ENCOUNTER — Encounter: Payer: Self-pay | Admitting: Oncology

## 2018-12-06 NOTE — Progress Notes (Signed)
Patient pre screened for office appointment, no questions or concerns today. 

## 2018-12-06 NOTE — Telephone Encounter (Signed)
Telephone call to patient for follow up after first chemo.  States infusion went really well and is feeling no different.   States did feel a little nausea one day but took antinausea meds and it went away.   Encouraged patient to call for any questions or concerns.   Patient thanked me for calling.

## 2018-12-07 ENCOUNTER — Inpatient Hospital Stay: Payer: Medicare Other

## 2018-12-07 ENCOUNTER — Inpatient Hospital Stay (HOSPITAL_BASED_OUTPATIENT_CLINIC_OR_DEPARTMENT_OTHER): Payer: Medicare Other | Admitting: Oncology

## 2018-12-07 ENCOUNTER — Other Ambulatory Visit: Payer: Self-pay

## 2018-12-07 ENCOUNTER — Inpatient Hospital Stay (HOSPITAL_BASED_OUTPATIENT_CLINIC_OR_DEPARTMENT_OTHER): Payer: Medicare Other | Admitting: Hospice and Palliative Medicine

## 2018-12-07 VITALS — BP 130/78 | HR 98 | Temp 97.8°F | Resp 16 | Wt 143.3 lb

## 2018-12-07 DIAGNOSIS — Z515 Encounter for palliative care: Secondary | ICD-10-CM | POA: Diagnosis not present

## 2018-12-07 DIAGNOSIS — C250 Malignant neoplasm of head of pancreas: Secondary | ICD-10-CM

## 2018-12-07 DIAGNOSIS — E876 Hypokalemia: Secondary | ICD-10-CM | POA: Diagnosis not present

## 2018-12-07 DIAGNOSIS — D649 Anemia, unspecified: Secondary | ICD-10-CM | POA: Diagnosis not present

## 2018-12-07 DIAGNOSIS — Z5111 Encounter for antineoplastic chemotherapy: Secondary | ICD-10-CM | POA: Diagnosis not present

## 2018-12-07 DIAGNOSIS — D490 Neoplasm of unspecified behavior of digestive system: Secondary | ICD-10-CM

## 2018-12-07 LAB — COMPREHENSIVE METABOLIC PANEL
ALT: 17 U/L (ref 0–44)
AST: 29 U/L (ref 15–41)
Albumin: 3 g/dL — ABNORMAL LOW (ref 3.5–5.0)
Alkaline Phosphatase: 225 U/L — ABNORMAL HIGH (ref 38–126)
Anion gap: 11 (ref 5–15)
BUN: 7 mg/dL — ABNORMAL LOW (ref 8–23)
CO2: 26 mmol/L (ref 22–32)
Calcium: 8.9 mg/dL (ref 8.9–10.3)
Chloride: 101 mmol/L (ref 98–111)
Creatinine, Ser: 0.62 mg/dL (ref 0.44–1.00)
GFR calc Af Amer: >60 mL/min (ref >60)
GFR calc non Af Amer: >60 mL/min (ref >60)
Glucose, Bld: 185 mg/dL — ABNORMAL HIGH (ref 70–99)
Potassium: 3.3 mmol/L — ABNORMAL LOW (ref 3.5–5.1)
Sodium: 138 mmol/L (ref 135–145)
Total Bilirubin: 1.1 mg/dL (ref 0.3–1.2)
Total Protein: 7.4 g/dL (ref 6.5–8.1)

## 2018-12-07 LAB — CBC WITH DIFFERENTIAL/PLATELET
Abs Immature Granulocytes: 0.03 10*3/uL (ref 0.00–0.07)
Basophils Absolute: 0 10*3/uL (ref 0.0–0.1)
Basophils Relative: 0 %
Eosinophils Absolute: 0.1 10*3/uL (ref 0.0–0.5)
Eosinophils Relative: 1 %
HCT: 31.1 % — ABNORMAL LOW (ref 36.0–46.0)
Hemoglobin: 10.4 g/dL — ABNORMAL LOW (ref 12.0–15.0)
Immature Granulocytes: 1 %
Lymphocytes Relative: 14 %
Lymphs Abs: 0.7 10*3/uL (ref 0.7–4.0)
MCH: 33 pg (ref 26.0–34.0)
MCHC: 33.4 g/dL (ref 30.0–36.0)
MCV: 98.7 fL (ref 80.0–100.0)
Monocytes Absolute: 0.6 10*3/uL (ref 0.1–1.0)
Monocytes Relative: 11 %
Neutro Abs: 3.8 10*3/uL (ref 1.7–7.7)
Neutrophils Relative %: 73 %
Platelets: 176 10*3/uL (ref 150–400)
RBC: 3.15 MIL/uL — ABNORMAL LOW (ref 3.87–5.11)
RDW: 13.5 % (ref 11.5–15.5)
WBC: 5.2 10*3/uL (ref 4.0–10.5)
nRBC: 0 % (ref 0.0–0.2)

## 2018-12-07 MED ORDER — PROCHLORPERAZINE MALEATE 10 MG PO TABS
10.0000 mg | ORAL_TABLET | Freq: Once | ORAL | Status: AC
  Administered 2018-12-07: 10 mg via ORAL
  Filled 2018-12-07: qty 1

## 2018-12-07 MED ORDER — SODIUM CHLORIDE 0.9 % IV SOLN
1600.0000 mg | Freq: Once | INTRAVENOUS | Status: AC
  Administered 2018-12-07: 1600 mg via INTRAVENOUS
  Filled 2018-12-07: qty 26.3

## 2018-12-07 MED ORDER — POTASSIUM CHLORIDE ER 10 MEQ PO TBCR: 10.0000 meq | EXTENDED_RELEASE_TABLET | Freq: Every day | ORAL | 0 refills | Status: DC

## 2018-12-07 MED ORDER — HEPARIN SOD (PORK) LOCK FLUSH 100 UNIT/ML IV SOLN
500.0000 [IU] | Freq: Once | INTRAVENOUS | Status: AC
  Administered 2018-12-07: 500 [IU] via INTRAVENOUS
  Filled 2018-12-07: qty 5

## 2018-12-07 MED ORDER — POTASSIUM CHLORIDE 20 MEQ/100ML IV SOLN
20.0000 meq | Freq: Once | INTRAVENOUS | Status: AC
  Administered 2018-12-07: 20 meq via INTRAVENOUS

## 2018-12-07 MED ORDER — SODIUM CHLORIDE 0.9% FLUSH
10.0000 mL | INTRAVENOUS | Status: DC | PRN
  Administered 2018-12-07: 10 mL via INTRAVENOUS
  Filled 2018-12-07: qty 10

## 2018-12-07 MED ORDER — PACLITAXEL PROTEIN-BOUND CHEMO INJECTION 100 MG
115.0000 mg/m2 | Freq: Once | INTRAVENOUS | Status: AC
  Administered 2018-12-07: 200 mg via INTRAVENOUS
  Filled 2018-12-07: qty 40

## 2018-12-07 MED ORDER — SODIUM CHLORIDE 0.9 % IV SOLN
Freq: Once | INTRAVENOUS | Status: AC
  Administered 2018-12-07: 09:00:00 via INTRAVENOUS
  Filled 2018-12-07: qty 250

## 2018-12-07 NOTE — Progress Notes (Signed)
Melissa [name]  [phone] Fax:([phone]) [phone]   Name: Melissa [name] Date: 12/07/2018 MRN: [id number]  DOB: 04/21/1940  Patient Care Team: Melissa Hector, MD as PCP - General (Internal Medicine) Melissa Jacks, RN as Oncology Nurse Navigator    REASON FOR CONSULTATION: Palliative Care consult requested for this 78 y.o. female with multiple medical problems including stage IV pancreatic cancer.  Patient was admitted 10/29/2018-10/31/2018 for ERCP and biliary stenting secondary to obstructive jaundice.  PET scan revealed hypermetabolic activity to retroperitoneal periaortic lymph node, sclerotic lesion of the right proximal femur and left sacrum.  Patient was referred to palliative care to help address goals and manage ongoing symptoms.   SOCIAL HISTORY:     reports that she has never smoked. She has never used smokeless tobacco. She reports that she does not drink alcohol or use drugs.   Patient is married and lives at home with her husband.  She was the caregiver for her 28 year old mother but recently had to move her into an ALF and mother subsequently died 6 days later.  Patient has a daughter who lives next door.  She had another daughter who died at age 65 from complications related to spina bifida.  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  ADVANCE DIRECTIVES:  Not on file.  Daughter is reportedly her healthcare power of attorney.  Patient also has a living will.  CODE STATUS: DNR  PAST MEDICAL HISTORY: Past Medical History:  Diagnosis Date   Glaucoma    Hypertension    Osteopenia    Stroke ([place])     PAST SURGICAL HISTORY:  Past Surgical History:  Procedure Laterality Date   BREAST BIOPSY Right    core- stereo- neg   ERCP N/A 10/29/2018   Procedure: ENDOSCOPIC RETROGRADE CHOLANGIOPANCREATOGRAPHY (ERCP);  Surgeon: Melissa Lame, MD;  Location[facility] ENDOSCOPY;  Service:  Endoscopy;  Laterality: N/A;   EUS N/A 11/18/2018   Procedure: FULL UPPER ENDOSCOPIC ULTRASOUND (EUS) RADIAL;  Surgeon: Melissa Bodily, MD;  Location: [facility] ENDOSCOPY;  Service: Gastroenterology;  Laterality: N/A;   HIP ARTHROPLASTY Left 01/02/2016   Procedure: ARTHROPLASTY BIPOLAR HIP (HEMIARTHROPLASTY);  Surgeon: Melissa Leep, MD;  Location: [facility] ORS;  Service: Orthopedics;  Laterality: Left;   JOINT REPLACEMENT Left    THR   PORTACATH PLACEMENT Right 11/26/2018   Procedure: INSERTION PORT-A-CATH;  Surgeon: Melissa Husbands, MD;  Location: [facility] ORS;  Service: General;  Laterality: Right;   TONSILLECTOMY      HEMATOLOGY/ONCOLOGY HISTORY:  Oncology History Overview Note  Melissa [name] is a  78 y.o.  female with PMH listed below was seen in consultation at the request of  Melissa Hector, MD  for evaluation of abnormal pancreatic disease. Patient was recently seen by primary care provider Dr. Caryl [name] for evaluation of nausea, diarrhea and jaundice.  Blood work on 10/19/2018 showed potassium 2.9, bilirubin 15, alkaline phosphatase 478, AST 114, ALT 95. Abdomen pelvis CT scan on 10/20/2018 showed moderate to market intra-and extra hepatic biliary dilation with mild diffuse dilatation of the pancreatic duct. Patient has had poor appetite and has lost a 5 to 10 pounds recently. She also had diarrhea.  She noticed sand like stool for 1- 2 weeks.  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  She placed her mother  to assisted living yesterday. She has had discussion with Dr. Caryl [name] about her blood work and CT scans.  She understands that there is a strong suspicion of cancer.  Nonessential medication has been stopped. CA 19-9 was check on 10/21/2018, level was elevated at 1500.   # Patient was admitted from 10/29/2018-10/31/2018.  Status post ERCP and biliary stenting.  Patient had  duodenum ampulla biopsy showed nondiagnostic.  Negative for invasive carcinoma. Bilirubin trended down to 11 at the time of discharge on 10/31/2018.   #PET scan images were independent reviewed by me and discussed with patient. cTxN2 M1a Patient has at least M1 a disease due to retroperitoneal/peri-aortic lymph node involvement,  sclerotic lesion of right proximal femur with mild hypermetabolic activity, as well as left sacrum   # EUS biopsy of pancreatic mass showed positive for malignancy, Adenocarcinoma. There is not enough tissue for NGS testing.  Attempt to at MMR, still not enough tissue to complete the testing   Malignant neoplasm of head of pancreas ([place])  11/02/2018 Initial Diagnosis   Malignant neoplasm of head of pancreas [facility])    Chemotherapy   The patient had PACLitaxel-protein bound (ABRAXANE) chemo infusion 200 mg, 115 mg/m2 = 225 mg, Intravenous,  Once, 1 of 5 cycles  Administration: 200 mg (11/30/2018)    gemcitabine (GEMZAR) 1,600 mg in sodium chloride 0.9 % 250 mL chemo infusion, 1,710 mg, Intravenous,  Once, 1 of 5 cycles  Administration: 1,600 mg (11/30/2018)  for chemotherapy treatment.       ALLERGIES:  has No Known Allergies.  MEDICATIONS:  Current Outpatient Medications  Medication Sig Dispense Refill   acetaminophen (TYLENOL) 500 MG tablet Take 500 mg by mouth every 6 (six) hours as needed.     amLODipine (NORVASC) 10 MG tablet Take 10 mg by mouth at bedtime.      docusate sodium (COLACE) 100 MG capsule Take 1 capsule (100 mg total) by mouth daily as needed for mild constipation or moderate constipation. Do not take if you have loose stools 60 capsule 2   lidocaine-prilocaine (EMLA) cream Apply to affected area once 30 g 3   ondansetron (ZOFRAN) 8 MG tablet Take 1 tablet by mouth as needed.     oxyCODONE (ROXICODONE) 5 MG immediate release tablet Take 1 tablet (5 mg total) by mouth every 6 (six) hours as needed for moderate pain or severe pain. 60  tablet 0   prochlorperazine (COMPAZINE) 10 MG tablet Take 1 tablet by mouth as needed.     No current facility-administered medications for this visit.    Facility-Administered Medications Ordered in Other Visits  Medication Dose Route Frequency Provider Last Rate Last Dose   gemcitabine (GEMZAR) 1,600 mg in sodium chloride 0.9 % 250 mL chemo infusion  1,600 mg Intravenous Once Earlie Server, MD       heparin lock flush 100 unit/mL  500 Units Intravenous Once Earlie Server, MD       PACLitaxel-protein bound (ABRAXANE) chemo infusion 200 mg  115 mg/m2 (Treatment Plan Recorded) Intravenous Once Earlie Server, MD 80 mL/hr at 12/07/18 1125 200 mg at 12/07/18 1125   sodium chloride flush (NS) 0.9 % injection 10 mL  10 mL Intravenous PRN Earlie Server, MD   10 mL at 12/07/18 [id number]    VITAL SIGNS: There were no vitals taken for this visit. There were no vitals filed for this visit.  Estimated body mass index is 27.99 kg/m as calculated from the following:   Height as of 12/02/18: 5' (  1.524 m).   Weight as of an earlier encounter on 12/07/18: 143 lb 4.8 oz (65 kg).  LABS: CBC:    Component Value Date/Time   WBC 5.2 12/07/2018 0826   HGB 10.4 (L) 12/07/2018 0826   HCT 31.1 (L) 12/07/2018 0826   PLT 176 12/07/2018 0826   MCV 98.7 12/07/2018 0826   NEUTROABS 3.8 12/07/2018 0826   LYMPHSABS 0.7 12/07/2018 0826   MONOABS 0.6 12/07/2018 0826   EOSABS 0.1 12/07/2018 0826   BASOSABS 0.0 12/07/2018 0826   Comprehensive Metabolic Panel:    Component Value Date/Time   NA 138 12/07/2018 0826   K 3.3 (L) 12/07/2018 0826   CL 101 12/07/2018 0826   CO2 26 12/07/2018 0826   BUN 7 (L) 12/07/2018 0826   CREATININE 0.62 12/07/2018 0826   GLUCOSE 185 (H) 12/07/2018 0826   CALCIUM 8.9 12/07/2018 0826   AST 29 12/07/2018 0826   ALT 17 12/07/2018 0826   ALKPHOS 225 (H) 12/07/2018 0826   BILITOT 1.1 12/07/2018 0826   PROT 7.4 12/07/2018 0826   ALBUMIN 3.0 (L) 12/07/2018 0826    RADIOGRAPHIC STUDIES: Mr Jeri Cos  GQ Contrast  Result Date: 11/26/2018 CLINICAL DATA:  History of pancreas cancer with new onset seizure EXAM: MRI HEAD WITHOUT AND WITH CONTRAST TECHNIQUE: Multiplanar, multiecho pulse sequences of the brain and surrounding structures were obtained without and with intravenous contrast. CONTRAST:  59m GADAVIST GADOBUTROL 1 MMOL/ML IV SOLN COMPARISON:  08/06/2006 FINDINGS: Brain: No focal cortical finding to explain seizure. No mass or swelling to suggest metastatic disease. Remote lacunar infarct in the left thalamus. Cerebral atrophy with nonspecific pattern. Mild small vessel ischemic gliosis in the white matter. No hydrocephalus or collection. Vascular: Major flow voids and vascular enhancements are preserved Skull and upper cervical spine: Negative for marrow lesion. Cervical facet spurring. Sinuses/Orbits: Negative IMPRESSION: No evidence of metastatic disease and no cortical finding to explain seizure. Electronically Signed   By: JMonte FantasiaM.D.   On: 11/26/2018 11:34   Nm Pet Image Initial (pi) Skull Base To Thigh  Result Date: 11/09/2018 CLINICAL DATA:  Initial treatment strategy for pancreatic mass. EXAM: NUCLEAR MEDICINE PET SKULL BASE TO THIGH TECHNIQUE: 8.4 mCi F-18 FDG was injected intravenously. Full-ring PET imaging was performed from the skull base to thigh after the radiotracer. CT data was obtained and used for attenuation correction and anatomic localization. Fasting blood glucose: 131 mg/dl COMPARISON:  CT abdomen 10/20/2018 FINDINGS: Mediastinal blood pool activity: SUV max 2.3 Liver activity: SUV max 3.5 NECK: No significant abnormal hypermetabolic activity in this region. Incidental CT findings: Chronic right maxillary sinusitis. CHEST: No significant abnormal hypermetabolic activity in this region. Incidental CT findings: Atherosclerotic calcification of the aortic arch and branch vessels. ABDOMEN/PELVIS: Abnormal accentuated activity in the pancreatic head, maximum SUV 8.7, high  suspicion for pancreatic head malignancy. The biliary stent passes through this region of abnormal accentuated metabolic activity. Hypermetabolic adjacent peripancreatic retroperitoneal lymph nodes. For example, a 1.3 cm peripancreatic lymph node a left periaortic lymph node measuring 1.4 cm in short axis on image 148/3 has a maximum SUV of 5.2. Other hypermetabolic porta hepatis and retroperitoneal adenopathy is also present. No hypermetabolic hepatic mass is observed. Incidental CT findings: Moderately distended gallbladder containing numerous small gallstones. Trace amount of perihepatic fluid. Mild stranding around the pancreatic head and porta hepatis region and extending into the root of the mesentery. Mild hypodensity along the pancreatic tail on image 142/3 corresponding to a small fluid density lesion shown  on prior CT scan in this vicinity. SKELETON: Mild accentuated activity along the left upper sacral ala and adjacent iliac bone. The sacral ala has a maximum SUV of 4.8 with the iliac bone having a maximum SUV of 4.7. Very faint sclerosis in the medullary space of the right proximal femur associated with accentuated metabolic activity, maximum SUV 4.0, this lesion measured 1.1 cm in diameter on image 246/3. Incidental CT findings: Left hip hemiarthroplasty. Lower lumbar degenerative facet arthropathy. Thoracic spondylosis. IMPRESSION: 1. Abnormal prominence of the pancreatic head with indistinctness of associated tissue planes and abnormal hypermetabolic activity with maximum SUV up to 8.7, compatible with malignancy. Pancreatic adenocarcinoma with certainly be a top differential diagnostic consideration. 2. Surrounding peripancreatic, porta hepatis, and retroperitoneal lymph nodes are hypermetabolic indicating malignant involvement. 3. Is a faintly sclerotic 1.1 cm lesion in the right proximal femur with mild hypermetabolic activity, maximum SUV 4.0. Uncertain significance, but the possibility of osseous  metastatic disease is raised. There are also indistinctly marginated areas of accentuated metabolic activity in the left sacral ala and adjacent iliac bone which could be inflammatory from mild sacroiliitis or due to malignancy, but these lesions do not have CT correlate. 4. Distended and mildly thick-walled gallbladder with numerous gallstones. A biliary stent is in place. Strictly speaking I cannot exclude acute cholecystitis. 5. Other imaging findings of potential clinical significance: Chronic right maxillary sinusitis. Aortic Atherosclerosis (ICD10-I70.0). Electronically Signed   By: Van Clines M.D.   On: 11/09/2018 13:22   Dg Chest Port 1 View  Result Date: 11/26/2018 CLINICAL DATA:  Porta athlete's min EXAM: PORTABLE CHEST 1 VIEW COMPARISON:  Portable exam 1426 hours compared to 01/01/2016 FINDINGS: RIGHT jugular Port-A-Cath with tip projecting over SVC. Upper normal heart size. Mediastinal contours and pulmonary vascularity normal. Subsegmental atelectasis retrocardiac LEFT lower lobe. Minimal dependent atelectasis at RIGHT base. No infiltrate, pleural effusion or pneumothorax. Multilevel endplate spur formation thoracic spine. IMPRESSION: Bibasilar atelectasis. No pneumothorax following Port-A-Cath insertion. Electronically Signed   By: Lavonia Dana M.D.   On: 11/26/2018 14:51   Dg C-arm 1-60 Min-no Report  Result Date: 11/26/2018 Fluoroscopy was utilized by the requesting physician.  No radiographic interpretation.    PERFORMANCE STATUS (ECOG) : 2 - Symptomatic, <50% confined to bed  Review of Systems Unless otherwise noted, a complete review of systems is negative.  Physical Exam General: NAD, frail appearing, thin Pulmonary: Unlabored Extremities: no edema, no joint deformities Skin: no rashes Neurological: Weakness but otherwise nonfocal  IMPRESSION: I met with patient in the infusion area.  Introduced palliative care services and attempted to establish therapeutic  rapport.  Patient says that she recognizes that the cancer is "aggressive" and may ultimately result in her passing.  She had a neighbor who recently died from pancreatic cancer but who chose not to pursue treatment.  Patient says that she is hopeful that treatment will provide her with an improvement in disease burden.  Symptomatically, patient reports doing reasonably well.  She has no acute pain or other distressing symptoms today.  Most of our visit was spent discussing her multiple family stressors.  Her mother recently died after moving into an ALF.  Patient says that her brother seems to blame her for their mother's passing.  She says that her brother is making managing their mother's estate difficult.  Patient lives at home with her 15 year old husband.  She says that he historically has been in poor health.  He is having to help more with her care at home.  She does describe her daughter as being very supportive and lives next door.  Patient has previously completed ACP documents and says she has a DNR order on her refrigerator, which was signed by Dr. Caryl [name].  PLAN: -Continue current scope of treatment -DNR -RTC in 3 to 4 weeks   Patient expressed understanding and was in agreement with this plan. She also understands that She can call the clinic at any time with any questions, concerns, or complaints.     Time Total: 30 minutes  Visit consisted of counseling and education dealing with the complex and emotionally intense issues of symptom management and palliative care in the setting of serious and potentially life-threatening illness.Greater than 50%  of this time was spent counseling and coordinating care related to the above assessment and plan.  Signed by: Altha Harm, PhD, NP-C [phone] (Work Cell)

## 2018-12-07 NOTE — Progress Notes (Signed)
Hematology/Oncology follow up note [name] [phone] [phone]   Patient Care Team: [name] as PCP - General (Internal Medicine) Clent Jacks, RN as Oncology Nurse Navigator  REFERRING PROVIDER: Adin Hector, [name]  CHIEF COMPLAINTS/REASON FOR VISIT:  Follow-up for pancreatic mass.  HISTORY OF PRESENTING ILLNESS:   Melissa [name] is a  78 y.o.  female with PMH listed below was seen in consultation at the request of  [name]  for evaluation of abnormal pancreatic disease. Patient was recently seen by primary care provider Melissa [name] for evaluation of nausea, diarrhea and jaundice.  Blood work on 10/19/2018 showed potassium 2.9, bilirubin 15, alkaline phosphatase 478, AST 114, ALT 95. Abdomen pelvis CT scan on 10/20/2018 showed moderate to market intra-and extra hepatic biliary dilation with mild diffuse dilatation of the pancreatic duct. Patient has had poor appetite and has lost a 5 to 10 pounds recently. She also had diarrhea.  She noticed sand like stool for 1- 2 weeks.  She takes care of her 40 year old mother and recently feels she is not able to take care of her anymore due to progressively worsening weakness and fatigue.  She placed her mother to assisted living yesterday. She has had discussion with [name] about her blood work and CT scans.  She understands that there is a strong suspicion of cancer.  Nonessential medication has been stopped. CA 19-9 was check on 10/21/2018, level was elevated at 1500.  Today she denies any pain.  Itchy all over her body.. Per patient's request, patient's daughter Melissa [name] was Melissa [name] and Melissa [name] was able to hear entire clinical encounter conversation and participate in the reported history and discussion.  # Patient was admitted from 10/29/2018-10/31/2018.  Status post ERCP and biliary stenting.  Patient had duodenum ampulla biopsy showed nondiagnostic.  Negative for  invasive carcinoma. Bilirubin trended down to 11 at the time of discharge on 10/31/2018.  #PET scan images were independent reviewed by me and discussed with patient. cTxN2 M1a Patient has at least M1 a disease due to retroperitoneal/peri-aortic lymph node involvement,  sclerotic lesion of right proximal femur with mild hypermetabolic activity, as well as left sacrum  # EUS biopsy of pancreatic mass showed positive for malignancy, Adenocarcinoma. There is not enough tissue for NGS testing.  Attempt to at MMR, still not enough tissue to complete the testing.  INTERVAL HISTORY [name] is a 78 y.o. female who has above history reviewed by me today presents for follow up visit for management of stage IV pancreatic cancer Patient had cycle 1 day 1 gemcitabine and Abraxane 1 week ago.  Overall she reports tolerating well. Mild nausea and she took antiemetics with symptom relief.   Denies any vomiting, diarrhea, fever, chills, chest pain, abdominal pain. Chronic fatigue at baseline    Review of Systems  Constitutional: Positive for appetite change, fatigue and unexpected weight change. Negative for chills and fever.  HENT:   Negative for hearing loss and voice change.   Eyes: Negative for eye problems.  Respiratory: Negative for chest tightness and cough.   Cardiovascular: Negative for chest pain.  Gastrointestinal: Positive for abdominal pain. Negative for abdominal distention and blood in stool.  Endocrine: Negative for hot flashes.  Genitourinary: Negative for difficulty urinating and frequency.   Musculoskeletal: Negative for arthralgias.  Skin: Negative for itching and rash.       Jaundice has improved.  Neurological: Negative for extremity weakness.  Hematological: Negative for adenopathy.  Psychiatric/Behavioral: Negative for confusion.    MEDICAL HISTORY:  Past Medical History:  Diagnosis Date   Glaucoma    Hypertension    Osteopenia    Stroke [name]      SURGICAL HISTORY: Past Surgical History:  Procedure Laterality Date   BREAST BIOPSY Right    core- stereo- neg   ERCP N/A 10/29/2018   Procedure: ENDOSCOPIC RETROGRADE CHOLANGIOPANCREATOGRAPHY (ERCP);  Surgeon: Melissa Lame, [name];  Location: [name];  Service: Endoscopy;  Laterality: N/A;   EUS N/A 11/18/2018   Procedure: FULL UPPER ENDOSCOPIC ULTRASOUND (EUS) RADIAL;  Surgeon: Melissa Bodily, [name];  Location: [name];  Service: Gastroenterology;  Laterality: N/A;   HIP ARTHROPLASTY Left 01/02/2016   Procedure: ARTHROPLASTY BIPOLAR HIP (HEMIARTHROPLASTY);  Surgeon: Dereck Leep, [name];  Location: [facility] ORS;  Service: Orthopedics;  Laterality: Left;   JOINT REPLACEMENT Left    THR   PORTACATH PLACEMENT Right 11/26/2018   Procedure: INSERTION PORT-A-CATH;  Surgeon: Melissa Husbands, [name];  Location: [facility] ORS;  Service: General;  Laterality: Right;   TONSILLECTOMY      SOCIAL HISTORY: Social History   Socioeconomic History   Marital status: Married    Spouse name: Not on file   Number of children: Not on file   Years of education: Not on file   Highest education level: Not on file  Occupational History   Not on file  Social Needs   Financial resource strain: Not on file   Food insecurity    Worry: Not on file    Inability: Not on file   Transportation needs    Medical: Not on file    Non-medical: Not on file  Tobacco Use   Smoking status: Never Smoker   Smokeless tobacco: Never Used  Substance and Sexual Activity   Alcohol use: No   Drug use: Never   Sexual activity: Not on file  Lifestyle   Physical activity    Days per week: Not on file    Minutes per session: Not on file   Stress: Not on file  Relationships   Social connections    Talks on phone: Not on file    Gets together: Not on file    Attends religious service: Not on file    Active member of club or organization: Not on file    Attends meetings of clubs or organizations:  Not on file    Relationship status: Not on file   Intimate partner violence    Fear of current or ex partner: Not on file    Emotionally abused: Not on file    Physically abused: Not on file    Forced sexual activity: Not on file  Other Topics Concern   Not on file  Social History Narrative   Not on file    FAMILY HISTORY: Family History  Problem Relation Age of Onset   Kidney disease Father    Breast cancer Neg Hx     ALLERGIES:  has No Known Allergies.  MEDICATIONS:  Current Outpatient Medications  Medication Sig Dispense Refill   acetaminophen (TYLENOL) 500 MG tablet Take 500 mg by mouth every 6 (six) hours as needed.     amLODipine (NORVASC) 10 MG tablet Take 10 mg by mouth at bedtime.      docusate sodium (COLACE) 100 MG capsule Take 1 capsule (100 mg total) by mouth daily as needed for mild constipation or moderate constipation. Do not take if you have loose stools  60 capsule 2   lidocaine-prilocaine (EMLA) cream Apply to affected area once 30 g 3   ondansetron (ZOFRAN) 8 MG tablet Take 1 tablet by mouth as needed.     oxyCODONE (ROXICODONE) 5 MG immediate release tablet Take 1 tablet (5 mg total) by mouth every 6 (six) hours as needed for moderate pain or severe pain. 60 tablet 0   prochlorperazine (COMPAZINE) 10 MG tablet Take 1 tablet by mouth as needed.     No current facility-administered medications for this visit.    Facility-Administered Medications Ordered in Other Visits  Medication Dose Route Frequency Provider Last Rate Last Dose   sodium chloride flush (NS) 0.9 % injection 10 mL  10 mL Intravenous PRN Earlie Server, [name]   10 mL at 12/07/18 0826     PHYSICAL EXAMINATION: ECOG PERFORMANCE STATUS: 1 - Symptomatic but completely ambulatory Vitals:   12/07/18 0846  BP: 130/78  Pulse: 98  Resp: 16  Temp: 97.8 F (36.6 C)   Filed Weights   12/07/18 0846  Weight: 143 lb 4.8 oz (65 kg)    Physical Exam Constitutional:      General: She is not  in acute distress. HENT:     Head: Normocephalic and atraumatic.  Eyes:     General: No scleral icterus.    Pupils: Pupils are equal, round, and reactive to light.  Neck:     Musculoskeletal: Normal range of motion and neck supple.  Cardiovascular:     Rate and Rhythm: Normal rate and regular rhythm.     Heart sounds: Normal heart sounds.  Pulmonary:     Effort: Pulmonary effort is normal. No respiratory distress.     Breath sounds: Normal breath sounds. No wheezing.  Abdominal:     General: Bowel sounds are normal. There is no distension.     Palpations: Abdomen is soft. There is no mass.     Tenderness: There is no abdominal tenderness.  Musculoskeletal: Normal range of motion.        General: No deformity.  Skin:    General: Skin is warm and dry.     Coloration: Skin is not jaundiced.     Findings: No erythema or rash.  Neurological:     Mental Status: She is alert and oriented to person, place, and time.     Cranial Nerves: No cranial nerve deficit.     Coordination: Coordination normal.  Psychiatric:        Behavior: Behavior normal.        Thought Content: Thought content normal.     LABORATORY DATA:  I have reviewed the data as listed Lab Results  Component Value Date   WBC 5.2 12/07/2018   HGB 10.4 (L) 12/07/2018   HCT 31.1 (L) 12/07/2018   MCV 98.7 12/07/2018   PLT 176 12/07/2018   Recent Labs    10/29/18 1318  10/31/18 0543 11/30/18 0925 12/07/18 0826  NA 135   < > 137 139 138  K 3.0*   < > 3.3* 3.2* 3.3*  CL 104   < > 109 102 101  CO2 21*   < > '22 26 26  ' GLUCOSE 110*   < > 114* 221* 185*  BUN 13   < > 11 13 7*  CREATININE 0.32*   < > 0.33* 0.62 0.62  CALCIUM 9.0   < > 8.1* 8.9 8.9  GFRNONAA >60   < > >60 >60 >60  GFRAA >60   < > >60 >60 >  60  PROT 7.0   < > 5.8* 7.4 7.4  ALBUMIN 2.6*   < > 2.0* 3.0* 3.0*  AST 119*   < > 88* 40 29  ALT 79*   < > 55* 21 17  ALKPHOS 399*   < > 328* 293* 225*  BILITOT 18.2*   < > 11.0* 1.4* 1.1  BILIDIR 10.2*   --   --   --   --    < > = values in this interval not displayed.   Iron/TIBC/Ferritin/ %Sat No results found for: IRON, TIBC, FERRITIN, IRONPCTSAT   RADIOGRAPHIC STUDIES: I have personally reviewed the radiological images as listed and agreed with the findings in the report. Mr Jeri Cos Wo Contrast  Result Date: 11/26/2018 CLINICAL DATA:  History of pancreas cancer with new onset seizure EXAM: MRI HEAD WITHOUT AND WITH CONTRAST TECHNIQUE: Multiplanar, multiecho pulse sequences of the brain and surrounding structures were obtained without and with intravenous contrast. CONTRAST:  86m GADAVIST GADOBUTROL 1 MMOL/ML IV SOLN COMPARISON:  08/06/2006 FINDINGS: Brain: No focal cortical finding to explain seizure. No mass or swelling to suggest metastatic disease. Remote lacunar infarct in the left thalamus. Cerebral atrophy with nonspecific pattern. Mild small vessel ischemic gliosis in the white matter. No hydrocephalus or collection. Vascular: Major flow voids and vascular enhancements are preserved Skull and upper cervical spine: Negative for marrow lesion. Cervical facet spurring. Sinuses/Orbits: Negative IMPRESSION: No evidence of metastatic disease and no cortical finding to explain seizure. Electronically Signed   By: JMonte FantasiaM.D.   On: 11/26/2018 11:34   Nm Pet Image Initial (pi) Skull Base To Thigh  Result Date: 11/09/2018 CLINICAL DATA:  Initial treatment strategy for pancreatic mass. EXAM: NUCLEAR MEDICINE PET SKULL BASE TO THIGH TECHNIQUE: 8.4 mCi F-18 FDG was injected intravenously. Full-ring PET imaging was performed from the skull base to thigh after the radiotracer. CT data was obtained and used for attenuation correction and anatomic localization. Fasting blood glucose: 131 mg/dl COMPARISON:  CT abdomen 10/20/2018 FINDINGS: Mediastinal blood pool activity: SUV max 2.3 Liver activity: SUV max 3.5 NECK: No significant abnormal hypermetabolic activity in this region. Incidental CT  findings: Chronic right maxillary sinusitis. CHEST: No significant abnormal hypermetabolic activity in this region. Incidental CT findings: Atherosclerotic calcification of the aortic arch and branch vessels. ABDOMEN/PELVIS: Abnormal accentuated activity in the pancreatic head, maximum SUV 8.7, high suspicion for pancreatic head malignancy. The biliary stent passes through this region of abnormal accentuated metabolic activity. Hypermetabolic adjacent peripancreatic retroperitoneal lymph nodes. For example, a 1.3 cm peripancreatic lymph node a left periaortic lymph node measuring 1.4 cm in short axis on image 148/3 has a maximum SUV of 5.2. Other hypermetabolic porta hepatis and retroperitoneal adenopathy is also present. No hypermetabolic hepatic mass is observed. Incidental CT findings: Moderately distended gallbladder containing numerous small gallstones. Trace amount of perihepatic fluid. Mild stranding around the pancreatic head and porta hepatis region and extending into the root of the mesentery. Mild hypodensity along the pancreatic tail on image 142/3 corresponding to a small fluid density lesion shown on prior CT scan in this vicinity. SKELETON: Mild accentuated activity along the left upper sacral ala and adjacent iliac bone. The sacral ala has a maximum SUV of 4.8 with the iliac bone having a maximum SUV of 4.7. Very faint sclerosis in the medullary space of the right proximal femur associated with accentuated metabolic activity, maximum SUV 4.0, this lesion measured 1.1 cm in diameter on image 246/3. Incidental CT findings: Left  hip hemiarthroplasty. Lower lumbar degenerative facet arthropathy. Thoracic spondylosis. IMPRESSION: 1. Abnormal prominence of the pancreatic head with indistinctness of associated tissue planes and abnormal hypermetabolic activity with maximum SUV up to 8.7, compatible with malignancy. Pancreatic adenocarcinoma with certainly be a top differential diagnostic consideration. 2.  Surrounding peripancreatic, porta hepatis, and retroperitoneal lymph nodes are hypermetabolic indicating malignant involvement. 3. Is a faintly sclerotic 1.1 cm lesion in the right proximal femur with mild hypermetabolic activity, maximum SUV 4.0. Uncertain significance, but the possibility of osseous metastatic disease is raised. There are also indistinctly marginated areas of accentuated metabolic activity in the left sacral ala and adjacent iliac bone which could be inflammatory from mild sacroiliitis or due to malignancy, but these lesions do not have CT correlate. 4. Distended and mildly thick-walled gallbladder with numerous gallstones. A biliary stent is in place. Strictly speaking I cannot exclude acute cholecystitis. 5. Other imaging findings of potential clinical significance: Chronic right maxillary sinusitis. Aortic Atherosclerosis (ICD10-I70.0). Electronically Signed   By: Van Clines M.D.   On: 11/09/2018 13:22   Dg Chest Port 1 View  Result Date: 11/26/2018 CLINICAL DATA:  Porta athlete's min EXAM: PORTABLE CHEST 1 VIEW COMPARISON:  Portable exam 1426 hours compared to 01/01/2016 FINDINGS: RIGHT jugular Port-A-Cath with tip projecting over SVC. Upper normal heart size. Mediastinal contours and pulmonary vascularity normal. Subsegmental atelectasis retrocardiac LEFT lower lobe. Minimal dependent atelectasis at RIGHT base. No infiltrate, pleural effusion or pneumothorax. Multilevel endplate spur formation thoracic spine. IMPRESSION: Bibasilar atelectasis. No pneumothorax following Port-A-Cath insertion. Electronically Signed   By: Lavonia Dana M.D.   On: 11/26/2018 14:51   Dg C-arm 1-60 Min-no Report  Result Date: 11/26/2018 Fluoroscopy was utilized by the requesting physician.  No radiographic interpretation.    [facility] labs CA 19-9 was check on 10/21/2018, level was elevated at 1500.    ASSESSMENT & PLAN:  1. Malignant neoplasm of head of pancreas ([place])   2. Hypokalemia    3. Normocytic anemia   . # Stage IV pancreatic cancer-MRI brain showed no CNS metastasis.   Labs were reviewed and discussed with patient. Counts acceptable to proceed with cycle 1 day 8 gemcitabine and Abraxane. Given her frail appearance/performance status, for cycle 1, I will omit day 15 treatment and she is going to have 1 week off prior to follow-up in the clinic for cycle 2 treatment. Check CA-19-9 at next visit.  Nausea, chemotherapy-induced, mild, continue antiemetics as discussed.    #Hypokalemia potassium 3.2.  Recommend patient to take  calcium chloride 10 mEq daily.  Will give 20 mEq potassium chloride IV x1.  Neoplasm related pain, pain is well controlled.   Patient has oxycodone 5 mg prescription at home. She may use Colace 100 mg daily as needed for constipation.  Follow-up with palliative service to establish care. #Leukocytosis, resolved. #Anemia, multifactorial secondary to cancer/chemotherapy.  Hemoglobin 10.4.  Continue to monitor. #Discussed about genetic testing.  Patient has been referred to genetic testing.   All questions were answered. The patient knows to call the clinic with any problems questions or concerns.  cc [name]   Return of visit: 2  week to evaluate tolerability of chemotherapy and assessment prior to next chemotherapy. We spent sufficient time to discuss many aspect of care, questions were answered to patient's satisfaction.  Earlie Server, MD, PhD Hematology Oncology [place] [name] Pager- [phone] 12/07/2018

## 2018-12-08 ENCOUNTER — Ambulatory Visit: Payer: Self-pay | Admitting: Surgery

## 2018-12-13 ENCOUNTER — Inpatient Hospital Stay: Payer: Medicare Other

## 2018-12-13 NOTE — Progress Notes (Signed)
Nutrition Follow-up:  Patient with stage IV pancreatic cancer.  Patient receiving palliative chemotherapy, gemcitabine and abraxane.    Spoke with patient via phone for nutrition follow-up.  Patient reports that thinks appetite has picked up a little bit.  Reports that she has added sausage balls to morning cereal and protein shake (350 calorie).  Has been eating ice cream and greek yogurt.  Friend made her yeast rolls recently that she has enjoyed.    Denies nausea or diarrhea.      Medications: reviewed  Labs: reviewed  Anthropometrics:   Weight relatively stable at 143 lb on 9/22.  144 lb on 9/11   NUTRITION DIAGNOSIS:  Inadequate oral intake stable   INTERVENTION:  Reviewed ways to increase calories and protein.  Will mail handout again as patient reports did not get it.  Encouraged patient to continue 350 calorie shake BID     MONITORING, EVALUATION, GOAL: Patient will consume adequate calories to maintain weight   NEXT VISIT: October 26 phone f/u  Shameer Molstad B. Zenia Resides, [place], [place] Registered Dietitian [phone] (pager)

## 2018-12-20 NOTE — Progress Notes (Signed)
Called patient no answer left message  

## 2018-12-21 ENCOUNTER — Inpatient Hospital Stay (HOSPITAL_BASED_OUTPATIENT_CLINIC_OR_DEPARTMENT_OTHER): Payer: Medicare Other | Admitting: Oncology

## 2018-12-21 ENCOUNTER — Inpatient Hospital Stay (HOSPITAL_BASED_OUTPATIENT_CLINIC_OR_DEPARTMENT_OTHER): Payer: Medicare Other | Admitting: Hospice and Palliative Medicine

## 2018-12-21 ENCOUNTER — Inpatient Hospital Stay: Payer: Medicare Other | Attending: Oncology

## 2018-12-21 ENCOUNTER — Inpatient Hospital Stay: Payer: Medicare Other

## 2018-12-21 ENCOUNTER — Other Ambulatory Visit: Payer: Self-pay

## 2018-12-21 ENCOUNTER — Encounter: Payer: Self-pay | Admitting: Oncology

## 2018-12-21 VITALS — BP 135/83 | HR 88 | Temp 96.0°F | Resp 18 | Wt 140.0 lb

## 2018-12-21 DIAGNOSIS — C250 Malignant neoplasm of head of pancreas: Secondary | ICD-10-CM | POA: Diagnosis not present

## 2018-12-21 DIAGNOSIS — R634 Abnormal weight loss: Secondary | ICD-10-CM

## 2018-12-21 DIAGNOSIS — Z5111 Encounter for antineoplastic chemotherapy: Secondary | ICD-10-CM | POA: Diagnosis not present

## 2018-12-21 DIAGNOSIS — G893 Neoplasm related pain (acute) (chronic): Secondary | ICD-10-CM | POA: Diagnosis not present

## 2018-12-21 DIAGNOSIS — Z515 Encounter for palliative care: Secondary | ICD-10-CM | POA: Diagnosis not present

## 2018-12-21 DIAGNOSIS — C772 Secondary and unspecified malignant neoplasm of intra-abdominal lymph nodes: Secondary | ICD-10-CM | POA: Insufficient documentation

## 2018-12-21 DIAGNOSIS — E876 Hypokalemia: Secondary | ICD-10-CM

## 2018-12-21 DIAGNOSIS — D649 Anemia, unspecified: Secondary | ICD-10-CM

## 2018-12-21 DIAGNOSIS — Z23 Encounter for immunization: Secondary | ICD-10-CM | POA: Insufficient documentation

## 2018-12-21 DIAGNOSIS — D490 Neoplasm of unspecified behavior of digestive system: Secondary | ICD-10-CM

## 2018-12-21 LAB — CBC WITH DIFFERENTIAL/PLATELET
Abs Immature Granulocytes: 0.05 10*3/uL (ref 0.00–0.07)
Basophils Absolute: 0 10*3/uL (ref 0.0–0.1)
Basophils Relative: 0 %
Eosinophils Absolute: 0.1 10*3/uL (ref 0.0–0.5)
Eosinophils Relative: 1 %
HCT: 35.5 % — ABNORMAL LOW (ref 36.0–46.0)
Hemoglobin: 11.6 g/dL — ABNORMAL LOW (ref 12.0–15.0)
Immature Granulocytes: 1 %
Lymphocytes Relative: 12 %
Lymphs Abs: 0.9 10*3/uL (ref 0.7–4.0)
MCH: 33 pg (ref 26.0–34.0)
MCHC: 32.7 g/dL (ref 30.0–36.0)
MCV: 101.1 fL — ABNORMAL HIGH (ref 80.0–100.0)
Monocytes Absolute: 0.9 10*3/uL (ref 0.1–1.0)
Monocytes Relative: 12 %
Neutro Abs: 5.4 10*3/uL (ref 1.7–7.7)
Neutrophils Relative %: 74 %
Platelets: 544 10*3/uL — ABNORMAL HIGH (ref 150–400)
RBC: 3.51 MIL/uL — ABNORMAL LOW (ref 3.87–5.11)
RDW: 15.3 % (ref 11.5–15.5)
WBC: 7.4 10*3/uL (ref 4.0–10.5)
nRBC: 0 % (ref 0.0–0.2)

## 2018-12-21 LAB — COMPREHENSIVE METABOLIC PANEL
ALT: 54 U/L — ABNORMAL HIGH (ref 0–44)
AST: 76 U/L — ABNORMAL HIGH (ref 15–41)
Albumin: 3.5 g/dL (ref 3.5–5.0)
Alkaline Phosphatase: 392 U/L — ABNORMAL HIGH (ref 38–126)
Anion gap: 9 (ref 5–15)
BUN: 8 mg/dL (ref 8–23)
CO2: 24 mmol/L (ref 22–32)
Calcium: 9.3 mg/dL (ref 8.9–10.3)
Chloride: 106 mmol/L (ref 98–111)
Creatinine, Ser: 0.61 mg/dL (ref 0.44–1.00)
GFR calc Af Amer: >60 mL/min (ref >60)
GFR calc non Af Amer: >60 mL/min (ref >60)
Glucose, Bld: 133 mg/dL — ABNORMAL HIGH (ref 70–99)
Potassium: 3.6 mmol/L (ref 3.5–5.1)
Sodium: 139 mmol/L (ref 135–145)
Total Bilirubin: 0.7 mg/dL (ref 0.3–1.2)
Total Protein: 8.2 g/dL — ABNORMAL HIGH (ref 6.5–8.1)

## 2018-12-21 MED ORDER — SODIUM CHLORIDE 0.9% FLUSH
10.0000 mL | INTRAVENOUS | Status: DC | PRN
  Administered 2018-12-21: 10 mL
  Filled 2018-12-21: qty 10

## 2018-12-21 MED ORDER — SODIUM CHLORIDE 0.9 % IV SOLN
1400.0000 mg | Freq: Once | INTRAVENOUS | Status: AC
  Administered 2018-12-21: 11:00:00 1400 mg via INTRAVENOUS
  Filled 2018-12-21: qty 26.3

## 2018-12-21 MED ORDER — PACLITAXEL PROTEIN-BOUND CHEMO INJECTION 100 MG
100.0000 mg/m2 | Freq: Once | INTRAVENOUS | Status: AC
  Administered 2018-12-21: 175 mg via INTRAVENOUS
  Filled 2018-12-21: qty 35

## 2018-12-21 MED ORDER — SODIUM CHLORIDE 0.9 % IV SOLN: 1600.0000 mg | Freq: Once | INTRAVENOUS | Status: DC

## 2018-12-21 MED ORDER — HEPARIN SOD (PORK) LOCK FLUSH 100 UNIT/ML IV SOLN
500.0000 [IU] | Freq: Once | INTRAVENOUS | Status: AC | PRN
  Administered 2018-12-21: 12:00:00 500 [IU]
  Filled 2018-12-21: qty 5

## 2018-12-21 MED ORDER — SODIUM CHLORIDE 0.9 % IV SOLN
Freq: Once | INTRAVENOUS | Status: AC
  Administered 2018-12-21: 09:00:00 via INTRAVENOUS
  Filled 2018-12-21: qty 250

## 2018-12-21 MED ORDER — PROCHLORPERAZINE MALEATE 10 MG PO TABS
10.0000 mg | ORAL_TABLET | Freq: Once | ORAL | Status: AC
  Administered 2018-12-21: 10:00:00 10 mg via ORAL
  Filled 2018-12-21: qty 1

## 2018-12-21 NOTE — Progress Notes (Signed)
Patient here for follow up. No concerns voiced.  °

## 2018-12-21 NOTE — Progress Notes (Signed)
[place]  [phone] [phone]   Name: Melissa [name] Date: 12/21/2018 MRN: [id number]  DOB: February 21, 1941  Patient Care Team: Adin Hector, MD as PCP - General (Internal Medicine) Clent Jacks, RN as Oncology Nurse Navigator    REASON FOR CONSULTATION: Palliative Care consult requested for this 78 y.o. female with multiple medical problems including stage IV pancreatic cancer.  Patient was admitted 10/29/2018-10/31/2018 for ERCP and biliary stenting secondary to obstructive jaundice.  PET scan revealed hypermetabolic activity to retroperitoneal periaortic lymph node, sclerotic lesion of the right proximal femur and left sacrum.  Patient was referred to palliative care to help address goals and manage ongoing symptoms.   SOCIAL HISTORY:     reports that she has never smoked. She has never used smokeless tobacco. She reports that she does not drink alcohol or use drugs.   Patient is married and lives at home with her husband.  She was the caregiver for her 61 year old mother but recently had to move her into an ALF and mother subsequently died 6 days later.  Patient has a daughter who lives next door.  She had another daughter who died at age 55 from complications related to spina bifida.  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  ADVANCE DIRECTIVES:  Not on file.  Daughter is reportedly her healthcare power of attorney.  Patient also has a living will.  CODE STATUS: DNR  PAST MEDICAL HISTORY: Past Medical History:  Diagnosis Date   Glaucoma    Hypertension    Osteopenia    Stroke ([place])     PAST SURGICAL HISTORY:  Past Surgical History:  Procedure Laterality Date   BREAST BIOPSY Right    core- stereo- neg   ERCP N/A 10/29/2018   Procedure: ENDOSCOPIC RETROGRADE CHOLANGIOPANCREATOGRAPHY (ERCP);  Surgeon: [name], MD;  Location: [facility] ENDOSCOPY;  Service:  Endoscopy;  Laterality: N/A;   EUS N/A 11/18/2018   Procedure: FULL UPPER ENDOSCOPIC ULTRASOUND (EUS) RADIAL;  Surgeon: Holly Bodily, MD;  Location: [facility] ENDOSCOPY;  Service: Gastroenterology;  Laterality: N/A;   HIP ARTHROPLASTY Left 01/02/2016   Procedure: ARTHROPLASTY BIPOLAR HIP (HEMIARTHROPLASTY);  Surgeon: Dereck Leep, MD;  Location: [facility] ORS;  Service: Orthopedics;  Laterality: Left;   JOINT REPLACEMENT Left    THR   PORTACATH PLACEMENT Right 11/26/2018   Procedure: INSERTION PORT-A-CATH;  Surgeon: Jules Husbands, MD;  Location: [facility] ORS;  Service: General;  Laterality: Right;   TONSILLECTOMY      HEMATOLOGY/ONCOLOGY HISTORY:  Oncology History Overview Note  Melissa [name] is a  78 y.o.  female with PMH listed below was seen in consultation at the request of  Adin Hector, MD  for evaluation of abnormal pancreatic disease. Patient was recently seen by primary care provider Dr. Caryl Comes for evaluation of nausea, diarrhea and jaundice.  Blood work on 10/19/2018 showed potassium 2.9, bilirubin 15, alkaline phosphatase 478, AST 114, ALT 95. Abdomen pelvis CT scan on 10/20/2018 showed moderate to market intra-and extra hepatic biliary dilation with mild diffuse dilatation of the pancreatic duct. Patient has had poor appetite and has lost a 5 to 10 pounds recently. She also had diarrhea.  She noticed sand like stool for 1- 2 weeks.  She takes care of her 70 year old mother and recently feels she is not able to take care of her anymore due to progressively worsening weakness and fatigue.  She placed her mother to  assisted living yesterday. She has had discussion with Dr. Caryl Comes about her blood work and CT scans.  She understands that there is a strong suspicion of cancer.  Nonessential medication has been stopped. CA 19-9 was check on 10/21/2018, level was elevated at 1500.   # Patient was admitted from 10/29/2018-10/31/2018.  Status post ERCP and biliary stenting.  Patient had  duodenum ampulla biopsy showed nondiagnostic.  Negative for invasive carcinoma. Bilirubin trended down to 11 at the time of discharge on 10/31/2018.   #PET scan images were independent reviewed by me and discussed with patient. cTxN2 M1a Patient has at least M1 a disease due to retroperitoneal/peri-aortic lymph node involvement,  sclerotic lesion of right proximal femur with mild hypermetabolic activity, as well as left sacrum   # EUS biopsy of pancreatic mass showed positive for malignancy, Adenocarcinoma. There is not enough tissue for NGS testing.  Attempt to at MMR, still not enough tissue to complete the testing   Malignant neoplasm of head of pancreas ([place])  11/02/2018 Initial Diagnosis   Malignant neoplasm of head of pancreas [facility])    Chemotherapy   The patient had PACLitaxel-protein bound (ABRAXANE) chemo infusion 200 mg, 115 mg/m2 = 225 mg, Intravenous,  Once, 1 of 5 cycles  Administration: 200 mg (11/30/2018)    gemcitabine (GEMZAR) 1,600 mg in sodium chloride 0.9 % 250 mL chemo infusion, 1,710 mg, Intravenous,  Once, 1 of 5 cycles  Administration: 1,600 mg (11/30/2018)  for chemotherapy treatment.       ALLERGIES:  has No Known Allergies.  MEDICATIONS:  Current Outpatient Medications  Medication Sig Dispense Refill   acetaminophen (TYLENOL) 500 MG tablet Take 500 mg by mouth every 6 (six) hours as needed.     amLODipine (NORVASC) 10 MG tablet Take 10 mg by mouth at bedtime.      docusate sodium (COLACE) 100 MG capsule Take 1 capsule (100 mg total) by mouth daily as needed for mild constipation or moderate constipation. Do not take if you have loose stools 60 capsule 2   lidocaine-prilocaine (EMLA) cream Apply to affected area once 30 g 3   ondansetron (ZOFRAN) 8 MG tablet Take 1 tablet by mouth as needed.     oxyCODONE (ROXICODONE) 5 MG immediate release tablet Take 1 tablet (5 mg total) by mouth every 6 (six) hours as needed for moderate pain or severe pain. 60  tablet 0   potassium chloride (K-DUR) 10 MEQ tablet Take 1 tablet (10 mEq total) by mouth daily. 30 tablet 0   prochlorperazine (COMPAZINE) 10 MG tablet Take 1 tablet by mouth as needed.     No current facility-administered medications for this visit.    Facility-Administered Medications Ordered in Other Visits  Medication Dose Route Frequency Provider Last Rate Last Dose   gemcitabine (GEMZAR) 1,400 mg in sodium chloride 0.9 % 250 mL chemo infusion  1,400 mg Intravenous Once Earlie Server, MD       heparin lock flush 100 unit/mL  500 Units Intracatheter Once PRN Earlie Server, MD       PACLitaxel-protein bound (ABRAXANE) chemo infusion 175 mg  100 mg/m2 (Order-Specific) Intravenous Once Earlie Server, MD       sodium chloride flush (NS) 0.9 % injection 10 mL  10 mL Intracatheter PRN Earlie Server, MD   10 mL at 12/21/18 0825    VITAL SIGNS: There were no vitals taken for this visit. There were no vitals filed for this visit.  Estimated body mass index is 27.34 kg/m  as calculated from the following:   Height as of 12/02/18: 5' (1.524 m).   Weight as of an earlier encounter on 12/21/18: 140 lb (63.5 kg).  LABS: CBC:    Component Value Date/Time   WBC 7.4 12/21/2018 0817   HGB 11.6 (L) 12/21/2018 0817   HCT 35.5 (L) 12/21/2018 0817   PLT 544 (H) 12/21/2018 0817   MCV 101.1 (H) 12/21/2018 0817   NEUTROABS 5.4 12/21/2018 0817   LYMPHSABS 0.9 12/21/2018 0817   MONOABS 0.9 12/21/2018 0817   EOSABS 0.1 12/21/2018 0817   BASOSABS 0.0 12/21/2018 0817   Comprehensive Metabolic Panel:    Component Value Date/Time   NA 139 12/21/2018 0817   K 3.6 12/21/2018 0817   CL 106 12/21/2018 0817   CO2 24 12/21/2018 0817   BUN 8 12/21/2018 0817   CREATININE 0.61 12/21/2018 0817   GLUCOSE 133 (H) 12/21/2018 0817   CALCIUM 9.3 12/21/2018 0817   AST 76 (H) 12/21/2018 0817   ALT 54 (H) 12/21/2018 0817   ALKPHOS 392 (H) 12/21/2018 0817   BILITOT 0.7 12/21/2018 0817   PROT 8.2 (H) 12/21/2018 0817   ALBUMIN  3.5 12/21/2018 0817    RADIOGRAPHIC STUDIES: Mr Jeri Cos HK Contrast  Result Date: 11/26/2018 CLINICAL DATA:  History of pancreas cancer with new onset seizure EXAM: MRI HEAD WITHOUT AND WITH CONTRAST TECHNIQUE: Multiplanar, multiecho pulse sequences of the brain and surrounding structures were obtained without and with intravenous contrast. CONTRAST:  39m GADAVIST GADOBUTROL 1 MMOL/ML IV SOLN COMPARISON:  08/06/2006 FINDINGS: Brain: No focal cortical finding to explain seizure. No mass or swelling to suggest metastatic disease. Remote lacunar infarct in the left thalamus. Cerebral atrophy with nonspecific pattern. Mild small vessel ischemic gliosis in the white matter. No hydrocephalus or collection. Vascular: Major flow voids and vascular enhancements are preserved Skull and upper cervical spine: Negative for marrow lesion. Cervical facet spurring. Sinuses/Orbits: Negative IMPRESSION: No evidence of metastatic disease and no cortical finding to explain seizure. Electronically Signed   By: JMonte FantasiaM.D.   On: 11/26/2018 11:34   Dg Chest Port 1 View  Result Date: 11/26/2018 CLINICAL DATA:  Porta athlete's min EXAM: PORTABLE CHEST 1 VIEW COMPARISON:  Portable exam 1426 hours compared to 01/01/2016 FINDINGS: RIGHT jugular Port-A-Cath with tip projecting over SVC. Upper normal heart size. Mediastinal contours and pulmonary vascularity normal. Subsegmental atelectasis retrocardiac LEFT lower lobe. Minimal dependent atelectasis at RIGHT base. No infiltrate, pleural effusion or pneumothorax. Multilevel endplate spur formation thoracic spine. IMPRESSION: Bibasilar atelectasis. No pneumothorax following Port-A-Cath insertion. Electronically Signed   By: MLavonia DanaM.D.   On: 11/26/2018 14:51   Dg C-arm 1-60 Min-no Report  Result Date: 11/26/2018 Fluoroscopy was utilized by the requesting physician.  No radiographic interpretation.    PERFORMANCE STATUS (ECOG) : 2 - Symptomatic, <50% confined to  bed  Review of Systems Unless otherwise noted, a complete review of systems is negative.  Physical Exam General: NAD, frail appearing, thin Pulmonary: Unlabored Extremities: no edema, no joint deformities Skin: no rashes Neurological: Weakness but otherwise nonfocal  IMPRESSION: Routine follow-up visit made today.  Patient was seen in the infusion room.  Patient reports that she is doing reasonably well so far.  She denies any acute changes or concerns.  She denies any distressing symptoms today.  She reports good oral intake.  Weight is down slightly by 5 pounds over the past 3 weeks.  Continue to follow weight trends.  Will consider early referral to dietitian.  We discussed patient's emotional coping.  She feels she is doing well.  She continues to have stressors associated with closing her mother's estate.  She is also had some strain in her relationship with her brother.  Patient did not sleep well last night while thinking about everything that is going on in her life.  Offered medication for insomnia but patient declined.  PLAN: -Continue current scope of treatment -RTC in 3 to 4 weeks   Patient expressed understanding and was in agreement with this plan. She also understands that She can call the clinic at any time with any questions, concerns, or complaints.     Time Total: 15 minutes  Visit consisted of counseling and education dealing with the complex and emotionally intense issues of symptom management and palliative care in the setting of serious and potentially life-threatening illness.Greater than 50%  of this time was spent counseling and coordinating care related to the above assessment and plan.  Signed by: Altha Harm, PhD, NP-C [phone] (Work Cell)

## 2018-12-21 NOTE — Progress Notes (Signed)
Hematology/Oncology follow up note [facility] Telephone:([phone]) [phone] Fax:([phone]   Patient Care Team: Adin Hector, MD as PCP - General (Internal Medicine) Clent Jacks, RN as Oncology Nurse Navigator  REFERRING PROVIDER: Adin Hector, MD  CHIEF COMPLAINTS/REASON FOR VISIT:  Follow-up for pancreatic cancer   HISTORY OF PRESENTING ILLNESS:   Melissa [name] is a  78 y.o.  female with PMH listed below was seen in consultation at the request of  Adin Hector, MD  for evaluation of abnormal pancreatic disease. Patient was recently seen by primary care provider Dr. Caryl Comes for evaluation of nausea, diarrhea and jaundice.  Blood work on 10/19/2018 showed potassium 2.9, bilirubin 15, alkaline phosphatase 478, AST 114, ALT 95. Abdomen pelvis CT scan on 10/20/2018 showed moderate to market intra-and extra hepatic biliary dilation with mild diffuse dilatation of the pancreatic duct. Patient has had poor appetite and has lost a 5 to 10 pounds recently. She also had diarrhea.  She noticed sand like stool for 1- 2 weeks.  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  She placed her mother to assisted living yesterday. She has had discussion with Dr. Caryl Comes about her blood work and CT scans.  She understands that there is a strong suspicion of cancer.  Nonessential medication has been stopped. CA 19-9 was check on 10/21/2018, level was elevated at 1500.  Today she denies any pain.  Itchy all over her body.. Per patient's request, patient's daughter [name] was [name] and [name] was able to hear entire clinical encounter conversation and participate in the reported history and discussion.  # Patient was admitted from 10/29/2018-10/31/2018.  Status post ERCP and biliary stenting.  Patient had duodenum ampulla biopsy showed nondiagnostic.  Negative for  invasive carcinoma. Bilirubin trended down to 11 at the time of discharge on 10/31/2018.  #PET scan images were independent reviewed by me and discussed with patient. cTxN2 M1a Patient has at least M1 a disease due to retroperitoneal/peri-aortic lymph node involvement,  sclerotic lesion of right proximal femur with mild hypermetabolic activity, as well as left sacrum  # EUS biopsy of pancreatic mass showed positive for malignancy, Adenocarcinoma. There is not enough tissue for NGS testing.  Attempt to at MMR, still not enough tissue to complete the testing.  INTERVAL HISTORY Melissa [name] is a 78 y.o. female who has above history reviewed by me today presents for follow up visit for management of stage IV pancreatic cancer Patient had cycle 1  gemcitabine and Abraxane day 1 and day 8 q. 21 days.  Overall tolerating well. Denies any nausea vomiting diarrhea.  No fever or chills. Chronic fatigue, slightly worse. Intermittent fingertip and toe numbness and tingling, no exacerbating or alleviating factors. Appetite is fair.  She eats half of the portion on her plate.  Lost 3 pounds since last visit. Denies any pain today. Review of Systems  Constitutional: Positive for appetite change, fatigue and unexpected weight change. Negative for chills and fever.  HENT:   Negative for hearing loss and voice change.   Eyes: Negative for eye problems and icterus.  Respiratory: Negative for chest tightness and cough.   Cardiovascular: Negative for chest pain.  Gastrointestinal: Negative for abdominal distention, abdominal pain and blood in stool.  Endocrine: Negative for hot flashes.  Genitourinary: Negative for difficulty urinating and frequency.   Musculoskeletal:  Negative for arthralgias.  Skin: Negative for itching and rash.       Jaundice has improved.  Neurological: Negative for extremity weakness.  Hematological: Negative for adenopathy.  Psychiatric/Behavioral: Negative for confusion.     MEDICAL HISTORY:  Past Medical History:  Diagnosis Date   Glaucoma    Hypertension    Osteopenia    Stroke [facility])     SURGICAL HISTORY: Past Surgical History:  Procedure Laterality Date   BREAST BIOPSY Right    core- stereo- neg   ERCP N/A 10/29/2018   Procedure: ENDOSCOPIC RETROGRADE CHOLANGIOPANCREATOGRAPHY (ERCP);  Surgeon: [name], MD;  Location: [facility] ENDOSCOPY;  Service: Endoscopy;  Laterality: N/A;   EUS N/A 11/18/2018   Procedure: FULL UPPER ENDOSCOPIC ULTRASOUND (EUS) RADIAL;  Surgeon: Holly Bodily, MD;  Location: [facility] ENDOSCOPY;  Service: Gastroenterology;  Laterality: N/A;   HIP ARTHROPLASTY Left 01/02/2016   Procedure: ARTHROPLASTY BIPOLAR HIP (HEMIARTHROPLASTY);  Surgeon: Dereck Leep, MD;  Location: [facility] ORS;  Service: Orthopedics;  Laterality: Left;   JOINT REPLACEMENT Left    THR   PORTACATH PLACEMENT Right 11/26/2018   Procedure: INSERTION PORT-A-CATH;  Surgeon: Jules Husbands, MD;  Location: [facility] ORS;  Service: General;  Laterality: Right;   TONSILLECTOMY      SOCIAL HISTORY: Social History   Socioeconomic History   Marital status: Married    Spouse name: Not on file   Number of children: Not on file   Years of education: Not on file   Highest education level: Not on file  Occupational History   Not on file  Social Needs   Financial resource strain: Not on file   Food insecurity    Worry: Not on file    Inability: Not on file   Transportation needs    Medical: Not on file    Non-medical: Not on file  Tobacco Use   Smoking status: Never Smoker   Smokeless tobacco: Never Used  Substance and Sexual Activity   Alcohol use: No   Drug use: Never   Sexual activity: Not on file  Lifestyle   Physical activity    Days per week: Not on file    Minutes per session: Not on file   Stress: Not on file  Relationships   Social connections    Talks on phone: Not on file    Gets together: Not on file    Attends  religious service: Not on file    Active member of club or organization: Not on file    Attends meetings of clubs or organizations: Not on file    Relationship status: Not on file   Intimate partner violence    Fear of current or ex partner: Not on file    Emotionally abused: Not on file    Physically abused: Not on file    Forced sexual activity: Not on file  Other Topics Concern   Not on file  Social History Narrative   Not on file    FAMILY HISTORY: Family History  Problem Relation Age of Onset   Kidney disease Father    Breast cancer Neg Hx     ALLERGIES:  has No Known Allergies.  MEDICATIONS:  Current Outpatient Medications  Medication Sig Dispense Refill   acetaminophen (TYLENOL) 500 MG tablet Take 500 mg by mouth every 6 (six) hours as needed.     amLODipine (NORVASC) 10 MG tablet Take 10 mg by mouth at bedtime.      docusate sodium (COLACE) 100 MG  capsule Take 1 capsule (100 mg total) by mouth daily as needed for mild constipation or moderate constipation. Do not take if you have loose stools 60 capsule 2   lidocaine-prilocaine (EMLA) cream Apply to affected area once 30 g 3   ondansetron (ZOFRAN) 8 MG tablet Take 1 tablet by mouth as needed.     oxyCODONE (ROXICODONE) 5 MG immediate release tablet Take 1 tablet (5 mg total) by mouth every 6 (six) hours as needed for moderate pain or severe pain. 60 tablet 0   potassium chloride (K-DUR) 10 MEQ tablet Take 1 tablet (10 mEq total) by mouth daily. 30 tablet 0   prochlorperazine (COMPAZINE) 10 MG tablet Take 1 tablet by mouth as needed.     No current facility-administered medications for this visit.      PHYSICAL EXAMINATION: ECOG PERFORMANCE STATUS: 2 - Symptomatic, <50% confined to bed Vitals:   12/21/18 0834  BP: 135/83  Pulse: 88  Resp: 18  Temp: (!) 96 F (35.6 C)   Filed Weights   12/21/18 0834  Weight: 140 lb (63.5 kg)    Physical Exam Constitutional:      General: She is not in acute  distress.    Comments: Frail, sitting in wheelchair.  HENT:     Head: Normocephalic and atraumatic.  Eyes:     General: No scleral icterus.    Pupils: Pupils are equal, round, and reactive to light.  Neck:     Musculoskeletal: Normal range of motion and neck supple.  Cardiovascular:     Rate and Rhythm: Normal rate and regular rhythm.     Heart sounds: Normal heart sounds.  Pulmonary:     Effort: Pulmonary effort is normal. No respiratory distress.     Breath sounds: Normal breath sounds. No wheezing.  Abdominal:     General: Bowel sounds are normal. There is no distension.     Palpations: Abdomen is soft. There is no mass.     Tenderness: There is no abdominal tenderness.  Musculoskeletal: Normal range of motion.        General: No deformity.  Skin:    General: Skin is warm and dry.     Coloration: Skin is not jaundiced.     Findings: No erythema or rash.  Neurological:     Mental Status: She is alert and oriented to person, place, and time.     Cranial Nerves: No cranial nerve deficit.     Coordination: Coordination normal.  Psychiatric:        Behavior: Behavior normal.        Thought Content: Thought content normal.     LABORATORY DATA:  I have reviewed the data as listed Lab Results  Component Value Date   WBC 7.4 12/21/2018   HGB 11.6 (L) 12/21/2018   HCT 35.5 (L) 12/21/2018   MCV 101.1 (H) 12/21/2018   PLT 544 (H) 12/21/2018   Recent Labs    10/29/18 1318  10/31/18 0543 11/30/18 0925 12/07/18 0826  NA 135   < > 137 139 138  K 3.0*   < > 3.3* 3.2* 3.3*  CL 104   < > 109 102 101  CO2 21*   < > _0 GLUCOSE 110*   < > 114* 221* 185*  BUN 13   < > 11 13 7*  CREATININE 0.32*   < > 0.33* 0.62 0.62  CALCIUM 9.0   < > 8.1* 8.9 8.9  GFRNONAA >60   < > >  60 >60 >60  GFRAA >60   < > >60 >60 >60  PROT 7.0   < > 5.8* 7.4 7.4  ALBUMIN 2.6*   < > 2.0* 3.0* 3.0*  AST 119*   < > 88* 40 29  ALT 79*   < > 55* 21 17  ALKPHOS 399*   < > 328* 293* 225*  BILITOT  18.2*   < > 11.0* 1.4* 1.1  BILIDIR 10.2*  --   --   --   --    < > = values in this interval not displayed.   Iron/TIBC/Ferritin/ %Sat No results found for: IRON, TIBC, FERRITIN, IRONPCTSAT   RADIOGRAPHIC STUDIES: I have personally reviewed the radiological images as listed and agreed with the findings in the report. Mr Jeri Cos Wo Contrast  Result Date: 11/26/2018 CLINICAL DATA:  History of pancreas cancer with new onset seizure EXAM: MRI HEAD WITHOUT AND WITH CONTRAST TECHNIQUE: Multiplanar, multiecho pulse sequences of the brain and surrounding structures were obtained without and with intravenous contrast. CONTRAST:  65m GADAVIST GADOBUTROL 1 MMOL/ML IV SOLN COMPARISON:  08/06/2006 FINDINGS: Brain: No focal cortical finding to explain seizure. No mass or swelling to suggest metastatic disease. Remote lacunar infarct in the left thalamus. Cerebral atrophy with nonspecific pattern. Mild small vessel ischemic gliosis in the white matter. No hydrocephalus or collection. Vascular: Major flow voids and vascular enhancements are preserved Skull and upper cervical spine: Negative for marrow lesion. Cervical facet spurring. Sinuses/Orbits: Negative IMPRESSION: No evidence of metastatic disease and no cortical finding to explain seizure. Electronically Signed   By: JMonte FantasiaM.D.   On: 11/26/2018 11:34   Dg Chest Port 1 View  Result Date: 11/26/2018 CLINICAL DATA:  Porta athlete's min EXAM: PORTABLE CHEST 1 VIEW COMPARISON:  Portable exam 1426 hours compared to 01/01/2016 FINDINGS: RIGHT jugular Port-A-Cath with tip projecting over SVC. Upper normal heart size. Mediastinal contours and pulmonary vascularity normal. Subsegmental atelectasis retrocardiac LEFT lower lobe. Minimal dependent atelectasis at RIGHT base. No infiltrate, pleural effusion or pneumothorax. Multilevel endplate spur formation thoracic spine. IMPRESSION: Bibasilar atelectasis. No pneumothorax following Port-A-Cath insertion.  Electronically Signed   By: MLavonia DanaM.D.   On: 11/26/2018 14:51   Dg C-arm 1-60 Min-no Report  Result Date: 11/26/2018 Fluoroscopy was utilized by the requesting physician.  No radiographic interpretation.    [facility]-9 was check on 10/21/2018, level was elevated at 1500.    ASSESSMENT & PLAN:  1. Malignant neoplasm of head of pancreas ([place]   2. Normocytic anemia   3. Encounter for antineoplastic chemotherapy   4. Neoplasm related pain   5. Hypokalemia   6. Weight loss   . # Stage IV pancreatic cancer-MRI brain showed no CNS metastasis.   Labs are reviewed and discussed with patient. Counts acceptable to proceed with cycle 2-day 1 gemcitabine and Abraxane.  #Grade 1 neuropathy.  New problem for her.  Likely due to chemotherapy.  Will decrease Abraxane to 100 mg/m. #Normocytic anemia, hemoglobin 11.6 today stable.  Continue to monitor. #Neoplasm related pain, well controlled.  Denies any pain today.  Patient has oxycodone 5 mg every 6 hours as needed prescription at home if she needs. #Hypokalemia, potassium 3.6 today.  Continue oral potassium supplementation. #Transaminitis, slightly worse.  Continue to monitor. #Weight loss, refer to dietitian. Follow-up with palliative service to establish care. #Discussed about genetic testing.  Patient has been referred to genetic testing.   All questions were answered. The patient knows  to call the clinic with any problems questions or concerns.  cc Adin Hector, MD   Return of visit: 1 week for evaluation prior to day 8 chemotherapy.   We spent sufficient time to discuss many aspect of care, questions were answered to patient's satisfaction.  Earlie Server, MD, PhD Hematology Oncology [place] at [facility] 12/21/2018

## 2018-12-22 LAB — CANCER ANTIGEN 19-9: CA 19-9: 1614 U/mL — ABNORMAL HIGH (ref 0–35)

## 2018-12-28 ENCOUNTER — Other Ambulatory Visit: Payer: Self-pay

## 2018-12-28 ENCOUNTER — Encounter: Payer: Self-pay | Admitting: Oncology

## 2018-12-28 ENCOUNTER — Inpatient Hospital Stay: Payer: Medicare Other

## 2018-12-28 ENCOUNTER — Inpatient Hospital Stay (HOSPITAL_BASED_OUTPATIENT_CLINIC_OR_DEPARTMENT_OTHER): Payer: Medicare Other | Admitting: Oncology

## 2018-12-28 VITALS — BP 115/78 | HR 96 | Temp 97.1°F | Resp 16 | Wt 138.6 lb

## 2018-12-28 DIAGNOSIS — G893 Neoplasm related pain (acute) (chronic): Secondary | ICD-10-CM

## 2018-12-28 DIAGNOSIS — D649 Anemia, unspecified: Secondary | ICD-10-CM

## 2018-12-28 DIAGNOSIS — Z5111 Encounter for antineoplastic chemotherapy: Secondary | ICD-10-CM | POA: Insufficient documentation

## 2018-12-28 DIAGNOSIS — R634 Abnormal weight loss: Secondary | ICD-10-CM

## 2018-12-28 DIAGNOSIS — D490 Neoplasm of unspecified behavior of digestive system: Secondary | ICD-10-CM

## 2018-12-28 DIAGNOSIS — C250 Malignant neoplasm of head of pancreas: Secondary | ICD-10-CM | POA: Diagnosis not present

## 2018-12-28 DIAGNOSIS — E876 Hypokalemia: Secondary | ICD-10-CM

## 2018-12-28 LAB — COMPREHENSIVE METABOLIC PANEL
ALT: 41 U/L (ref 0–44)
AST: 44 U/L — ABNORMAL HIGH (ref 15–41)
Albumin: 3.9 g/dL (ref 3.5–5.0)
Alkaline Phosphatase: 219 U/L — ABNORMAL HIGH (ref 38–126)
Anion gap: 10 (ref 5–15)
BUN: 12 mg/dL (ref 8–23)
CO2: 25 mmol/L (ref 22–32)
Calcium: 9.6 mg/dL (ref 8.9–10.3)
Chloride: 103 mmol/L (ref 98–111)
Creatinine, Ser: 0.69 mg/dL (ref 0.44–1.00)
GFR calc Af Amer: >60 mL/min (ref >60)
GFR calc non Af Amer: >60 mL/min (ref >60)
Glucose, Bld: 158 mg/dL — ABNORMAL HIGH (ref 70–99)
Potassium: 3.6 mmol/L (ref 3.5–5.1)
Sodium: 138 mmol/L (ref 135–145)
Total Bilirubin: 0.6 mg/dL (ref 0.3–1.2)
Total Protein: 8.1 g/dL (ref 6.5–8.1)

## 2018-12-28 LAB — CBC WITH DIFFERENTIAL/PLATELET
Abs Immature Granulocytes: 0.06 10*3/uL (ref 0.00–0.07)
Basophils Absolute: 0.1 10*3/uL (ref 0.0–0.1)
Basophils Relative: 1 %
Eosinophils Absolute: 0 10*3/uL (ref 0.0–0.5)
Eosinophils Relative: 1 %
HCT: 36.8 % (ref 36.0–46.0)
Hemoglobin: 12.2 g/dL (ref 12.0–15.0)
Immature Granulocytes: 1 %
Lymphocytes Relative: 21 %
Lymphs Abs: 0.9 10*3/uL (ref 0.7–4.0)
MCH: 33.2 pg (ref 26.0–34.0)
MCHC: 33.2 g/dL (ref 30.0–36.0)
MCV: 100 fL (ref 80.0–100.0)
Monocytes Absolute: 0.5 10*3/uL (ref 0.1–1.0)
Monocytes Relative: 11 %
Neutro Abs: 2.9 10*3/uL (ref 1.7–7.7)
Neutrophils Relative %: 65 %
Platelets: 238 10*3/uL (ref 150–400)
RBC: 3.68 MIL/uL — ABNORMAL LOW (ref 3.87–5.11)
RDW: 14.2 % (ref 11.5–15.5)
WBC: 4.5 10*3/uL (ref 4.0–10.5)
nRBC: 0 % (ref 0.0–0.2)

## 2018-12-28 MED ORDER — HEPARIN SOD (PORK) LOCK FLUSH 100 UNIT/ML IV SOLN
500.0000 [IU] | Freq: Once | INTRAVENOUS | Status: AC
  Administered 2018-12-28: 500 [IU] via INTRAVENOUS
  Filled 2018-12-28: qty 5

## 2018-12-28 MED ORDER — SODIUM CHLORIDE 0.9 % IV SOLN
Freq: Once | INTRAVENOUS | Status: AC
  Administered 2018-12-28: 10:00:00 via INTRAVENOUS
  Filled 2018-12-28: qty 250

## 2018-12-28 MED ORDER — SODIUM CHLORIDE 0.9% FLUSH
10.0000 mL | INTRAVENOUS | Status: DC | PRN
  Administered 2018-12-28: 10 mL via INTRAVENOUS
  Filled 2018-12-28: qty 10

## 2018-12-28 MED ORDER — PACLITAXEL PROTEIN-BOUND CHEMO INJECTION 100 MG
100.0000 mg/m2 | Freq: Once | INTRAVENOUS | Status: AC
  Administered 2018-12-28: 175 mg via INTRAVENOUS
  Filled 2018-12-28: qty 35

## 2018-12-28 MED ORDER — SODIUM CHLORIDE 0.9 % IV SOLN
1600.0000 mg | Freq: Once | INTRAVENOUS | Status: AC
  Administered 2018-12-28: 1600 mg via INTRAVENOUS
  Filled 2018-12-28: qty 26.3

## 2018-12-28 MED ORDER — PROCHLORPERAZINE MALEATE 10 MG PO TABS
10.0000 mg | ORAL_TABLET | Freq: Once | ORAL | Status: AC
  Administered 2018-12-28: 10:00:00 10 mg via ORAL
  Filled 2018-12-28: qty 1

## 2018-12-28 NOTE — Progress Notes (Signed)
Patient does not offer any problems today.  

## 2018-12-28 NOTE — Progress Notes (Signed)
Hematology/Oncology follow up note [facility] Telephone:([phone]) [phone] Fax:([phone]   Melissa [name] [name] Care Team: Adin Hector, MD as PCP - General (Internal Medicine) Clent Jacks, RN as Oncology Nurse Navigator  REFERRING PROVIDER: Adin Hector, MD  CHIEF COMPLAINTS/REASON FOR VISIT:  Follow-up for pancreatic cancer   HISTORY OF PRESENTING ILLNESS:   Melissa [name] Melissa [name] [name] is a  78 y.o.  female with PMH listed below was seen in consultation at Melissa [name] request of  Adin Hector, MD  for evaluation of abnormal pancreatic disease. Melissa [name] [name] was recently seen by primary care provider Dr. Caryl Comes for evaluation of nausea, diarrhea and jaundice.  Blood work on 10/19/2018 showed potassium 2.9, bilirubin 15, alkaline phosphatase 478, AST 114, ALT 95. Abdomen pelvis CT scan on 10/20/2018 showed moderate to market intra-and extra hepatic biliary dilation with mild diffuse dilatation of Melissa [name] pancreatic duct. Melissa [name] [name] has had poor appetite and has lost a 5 to 10 pounds recently. She also had diarrhea.  She noticed sand like stool for 1- 2 weeks.  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  She placed her mother to assisted living yesterday. She has had discussion with Dr. Caryl Comes about her blood work and CT scans.  She understands that there is a strong suspicion of cancer.  Nonessential medication has been stopped. CA 19-9 was check on 10/21/2018, level was elevated at 1500.  Today she denies any pain.  Itchy all over her body.. Per Melissa [name] [name]'s request, Melissa [name] [name]'s daughter [name] was [name] and [name] was able to hear entire clinical encounter conversation and participate in Melissa [name] reported history and discussion.  # Melissa [name] [name] was admitted from 10/29/2018-10/31/2018.  Status post ERCP and biliary stenting.  Melissa [name] [name] had duodenum ampulla biopsy showed nondiagnostic.  Negative for  invasive carcinoma. Bilirubin trended down to 11 at Melissa [name] time of discharge on 10/31/2018.  #PET scan images were independent reviewed by me and discussed with Melissa [name] [name]. cTxN2 M1a Melissa [name] [name] has at least M1 a disease due to retroperitoneal/peri-aortic lymph node involvement,  sclerotic lesion of right proximal femur with mild hypermetabolic activity, as well as left sacrum  # EUS biopsy of pancreatic mass showed positive for malignancy, Adenocarcinoma. There is not enough tissue for NGS testing.  Attempt to at MMR, still not enough tissue to complete Melissa [name] testing.  INTERVAL HISTORY Melissa [name] Melissa [name] is a 78 y.o. female who has above history reviewed by me today presents for follow up visit for management of stage IV pancreatic cancer Melissa [name] [name] had cycle 2  gemcitabine and Abraxane day 1 treatment 1 week ago. Reports feeling well.  Denies any nausea vomiting diarrhea.  No fever or chills. Chronic fatigue slightly worse. Appetite is not very good. She lost another 2 pounds since 1 week ago. Denies any pain today. Review of Systems  Constitutional: Positive for appetite change, fatigue and unexpected weight change. Negative for chills and fever.  HENT:   Negative for hearing loss and voice change.   Eyes: Negative for eye problems and icterus.  Respiratory: Negative for chest tightness and cough.   Cardiovascular: Negative for chest pain.  Gastrointestinal: Negative for abdominal distention, abdominal pain and blood in stool.  Endocrine: Negative for hot flashes.  Genitourinary: Negative for difficulty urinating and frequency.   Musculoskeletal: Negative for arthralgias.  Skin: Negative for itching and rash.       Jaundice has improved.  Neurological: Negative for extremity weakness.  Hematological: Negative for adenopathy.  Psychiatric/Behavioral: Negative for confusion.    MEDICAL HISTORY:  Past Medical History:  Diagnosis Date   Glaucoma    Hypertension    Osteopenia    Stroke  [facility])     SURGICAL HISTORY: Past Surgical History:  Procedure Laterality Date   BREAST BIOPSY Right    core- stereo- neg   ERCP N/A 10/29/2018   Procedure: ENDOSCOPIC RETROGRADE CHOLANGIOPANCREATOGRAPHY (ERCP);  Surgeon: [name], MD;  Location: [facility] ENDOSCOPY;  Service: Endoscopy;  Laterality: N/A;   EUS N/A 11/18/2018   Procedure: FULL UPPER ENDOSCOPIC ULTRASOUND (EUS) RADIAL;  Surgeon: Holly Bodily, MD;  Location: [facility] ENDOSCOPY;  Service: Gastroenterology;  Laterality: N/A;   HIP ARTHROPLASTY Left 01/02/2016   Procedure: ARTHROPLASTY BIPOLAR HIP (HEMIARTHROPLASTY);  Surgeon: Dereck Leep, MD;  Location: [facility] ORS;  Service: Orthopedics;  Laterality: Left;   JOINT REPLACEMENT Left    THR   PORTACATH PLACEMENT Right 11/26/2018   Procedure: INSERTION PORT-A-CATH;  Surgeon: Jules Husbands, MD;  Location: [facility] ORS;  Service: General;  Laterality: Right;   TONSILLECTOMY      SOCIAL HISTORY: Social History   Socioeconomic History   Marital status: Married    Spouse name: Not on file   Number of children: Not on file   Years of education: Not on file   Highest education level: Not on file  Occupational History   Not on file  Social Needs   Financial resource strain: Not on file   Food insecurity    Worry: Not on file    Inability: Not on file   Transportation needs    Medical: Not on file    Non-medical: Not on file  Tobacco Use   Smoking status: Never Smoker   Smokeless tobacco: Never Used  Substance and Sexual Activity   Alcohol use: No   Drug use: Never   Sexual activity: Not on file  Lifestyle   Physical activity    Days per week: Not on file    Minutes per session: Not on file   Stress: Not on file  Relationships   Social connections    Talks on phone: Not on file    Gets together: Not on file    Attends religious service: Not on file    Active member of club or organization: Not on file    Attends meetings of clubs or  organizations: Not on file    Relationship status: Not on file   Intimate partner violence    Fear of current or ex partner: Not on file    Emotionally abused: Not on file    Physically abused: Not on file    Forced sexual activity: Not on file  Other Topics Concern   Not on file  Social History Narrative   Not on file    FAMILY HISTORY: Family History  Problem Relation Age of Onset   Kidney disease Father    Breast cancer Neg Hx     ALLERGIES:  has No Known Allergies.  MEDICATIONS:  Current Outpatient Medications  Medication Sig Dispense Refill   acetaminophen (TYLENOL) 500 MG tablet Take 500 mg by mouth every 6 (six) hours as needed.     amLODipine (NORVASC) 10 MG tablet Take 10 mg by mouth at bedtime.      docusate sodium (COLACE) 100 MG capsule Take 1 capsule (100 mg total) by mouth daily as needed for mild constipation or moderate constipation. Do not  take if you have loose stools 60 capsule 2   lidocaine-prilocaine (EMLA) cream Apply to affected area once 30 g 3   ondansetron (ZOFRAN) 8 MG tablet Take 1 tablet by mouth as needed.     oxyCODONE (ROXICODONE) 5 MG immediate release tablet Take 1 tablet (5 mg total) by mouth every 6 (six) hours as needed for moderate pain or severe pain. 60 tablet 0   potassium chloride (K-DUR) 10 MEQ tablet Take 1 tablet (10 mEq total) by mouth daily. 30 tablet 0   prochlorperazine (COMPAZINE) 10 MG tablet Take 1 tablet by mouth as needed.     No current facility-administered medications for this visit.      PHYSICAL EXAMINATION: ECOG PERFORMANCE STATUS: 2 - Symptomatic, <50% confined to bed Vitals:   12/28/18 0910  BP: 115/78  Pulse: 96  Resp: 16  Temp: (!) 97.1 F (36.2 C)   Filed Weights   12/28/18 0910  Weight: 138 lb 9.6 oz (62.9 kg)    Physical Exam Constitutional:      General: She is not in acute distress.    Comments: Frail, sitting in wheelchair.  HENT:     Head: Normocephalic and atraumatic.  Eyes:      General: No scleral icterus.    Pupils: Pupils are equal, round, and reactive to light.  Neck:     Musculoskeletal: Normal range of motion and neck supple.  Cardiovascular:     Rate and Rhythm: Normal rate and regular rhythm.     Heart sounds: Normal heart sounds.  Pulmonary:     Effort: Pulmonary effort is normal. No respiratory distress.     Breath sounds: Normal breath sounds. No wheezing.  Abdominal:     General: Bowel sounds are normal. There is no distension.     Palpations: Abdomen is soft. There is no mass.     Tenderness: There is no abdominal tenderness.  Musculoskeletal: Normal range of motion.        General: No deformity.  Skin:    General: Skin is warm and dry.     Coloration: Skin is not jaundiced.     Findings: No erythema or rash.  Neurological:     Mental Status: She is alert and oriented to person, place, and time.     Cranial Nerves: No cranial nerve deficit.     Coordination: Coordination normal.  Psychiatric:        Behavior: Behavior normal.        Thought Content: Thought content normal.     LABORATORY DATA:  I have reviewed Melissa [name] data as listed Lab Results  Component Value Date   WBC 4.5 12/28/2018   HGB 12.2 12/28/2018   HCT 36.8 12/28/2018   MCV 100.0 12/28/2018   PLT 238 12/28/2018   Recent Labs    10/29/18 1318  12/07/18 0826 12/21/18 0817 12/28/18 0842  NA 135   < > 138 139 138  K 3.0*   < > 3.3* 3.6 3.6  CL 104   < > 101 106 103  CO2 21*   < > '26 24 25  ' GLUCOSE 110*   < > 185* 133* 158*  BUN 13   < > 7* 8 12  CREATININE 0.32*   < > 0.62 0.61 0.69  CALCIUM 9.0   < > 8.9 9.3 9.6  GFRNONAA >60   < > >60 >60 >60  GFRAA >60   < > >60 >60 >60  PROT 7.0   < > 7.4  8.2* 8.1  ALBUMIN 2.6*   < > 3.0* 3.5 3.9  AST 119*   < > 29 76* 44*  ALT 79*   < > 17 54* 41  ALKPHOS 399*   < > 225* 392* 219*  BILITOT 18.2*   < > 1.1 0.7 0.6  BILIDIR 10.2*  --   --   --   --    < > = values in this interval not displayed.   Iron/TIBC/Ferritin/  %Sat No results found for: IRON, TIBC, FERRITIN, IRONPCTSAT   RADIOGRAPHIC STUDIES: I have personally reviewed Melissa [name] radiological images as listed and agreed with Melissa [name] findings in Melissa [name] report. Mr Jeri Cos Wo Contrast  Result Date: 11/26/2018 CLINICAL DATA:  History of pancreas cancer with new onset seizure EXAM: MRI HEAD WITHOUT AND WITH CONTRAST TECHNIQUE: Multiplanar, multiecho pulse sequences of Melissa [name] brain and surrounding structures were obtained without and with intravenous contrast. CONTRAST:  23m GADAVIST GADOBUTROL 1 MMOL/ML IV SOLN COMPARISON:  08/06/2006 FINDINGS: Brain: No focal cortical finding to explain seizure. No mass or swelling to suggest metastatic disease. Remote lacunar infarct in Melissa [name] left thalamus. Cerebral atrophy with nonspecific pattern. Mild small vessel ischemic gliosis in Melissa [name] white matter. No hydrocephalus or collection. Vascular: Major flow voids and vascular enhancements are preserved Skull and upper cervical spine: Negative for marrow lesion. Cervical facet spurring. Sinuses/Orbits: Negative IMPRESSION: No evidence of metastatic disease and no cortical finding to explain seizure. Electronically Signed   By: JMonte FantasiaM.D.   On: 11/26/2018 11:34   Ct Abdomen Pelvis W Contrast  Result Date: 10/20/2018 CLINICAL DATA:  Jaundice EXAM: CT ABDOMEN AND PELVIS WITH CONTRAST TECHNIQUE: Multidetector CT imaging of Melissa [name] abdomen and pelvis was performed using Melissa [name] standard protocol following bolus administration of intravenous contrast. CONTRAST:  1070mOMNIPAQUE IOHEXOL 300 MG/ML  SOLN COMPARISON:  None. FINDINGS: Lower chest: Lung bases demonstrate no acute consolidation or effusion. Melissa [name] heart size is within normal limits. Small hiatal hernia. Hepatobiliary: Subcentimeter hypodensity left hepatic lobe, too small to further characterize but probably a small cyst. Moderate intra hepatic biliary dilatation. Enlarged extrahepatic common bile duct, measuring up to 16 mm in diameter on coronal  views. Distended gallbladder with numerous calcified stones. There may be slight gallbladder wall thickening or edema. Pancreas: Mild diffuse enlargement of Melissa [name] pancreatic head which appears somewhat indistinct. Mild hazy edema around Melissa [name] pancreatic head. Suspect indistinct mass at Melissa [name] head of pancreas. Mild pancreatic ductal dilatation. 11 mm indeterminate cystic lesion distal body/tail of pancreas, series 2, image number 27. Spleen: Normal in size without focal abnormality. Adrenals/Urinary Tract: Adrenal glands are unremarkable. Kidneys are normal, without renal calculi, focal lesion, or hydronephrosis. Bladder is unremarkable. Stomach/Bowel: Stomach is within normal limits. Appendix appears normal. No evidence of bowel wall thickening, distention, or inflammatory changes. Vascular/Lymphatic: Nonaneurysmal aorta. Mild aortic atherosclerosis. Multiple enlarged peripancreatic and porta hepatis lymph nodes measuring up to 2.2 cm, coronal series 5, image number 34. Enlarged retroperitoneal/Peri aortic lymph nodes, measuring up to 2.2 cm. Central low density within Melissa [name] periaortic nodes, possibly due to necrosis. Reproductive: Uterus and bilateral adnexa are unremarkable. Other: Negative for free air or free fluid. Small fat in Melissa [name] umbilical region. Musculoskeletal: No acute or significant osseous findings. IMPRESSION: 1. Moderate to marked intra and extrahepatic biliary dilatation with mild diffuse dilatation of Melissa [name] pancreatic duct. Diffuse enlargement and indistinct appearance of Melissa [name] pancreatic head, constellation of findings is suspect for biliary obstruction secondary to malignant stricture/possible pancreatic head mass. Additional finding of small cystic  lesions within Melissa [name] distal body of Melissa [name] pancreas which could be more thoroughly evaluated with MRI 2. Multiple calcified gallstones. Gallbladder slightly enlarged and there may be slight gallbladder wall edema or thickening 3. Multiple enlarged porta hepatis,  peripancreatic, and retroperitoneal nodes, raising concern for metastatic disease. Electronically Signed   By: Donavan Foil M.D.   On: 10/20/2018 18:41   Nm Pet Image Initial (pi) Skull Base To Thigh  Result Date: 11/09/2018 CLINICAL DATA:  Initial treatment strategy for pancreatic mass. EXAM: NUCLEAR MEDICINE PET SKULL BASE TO THIGH TECHNIQUE: 8.4 mCi F-18 FDG was injected intravenously. Full-ring PET imaging was performed from Melissa [name] skull base to thigh after Melissa [name] radiotracer. CT data was obtained and used for attenuation correction and anatomic localization. Fasting blood glucose: 131 mg/dl COMPARISON:  CT abdomen 10/20/2018 FINDINGS: Mediastinal blood pool activity: SUV max 2.3 Liver activity: SUV max 3.5 NECK: No significant abnormal hypermetabolic activity in this region. Incidental CT findings: Chronic right maxillary sinusitis. CHEST: No significant abnormal hypermetabolic activity in this region. Incidental CT findings: Atherosclerotic calcification of Melissa [name] aortic arch and branch vessels. ABDOMEN/PELVIS: Abnormal accentuated activity in Melissa [name] pancreatic head, maximum SUV 8.7, high suspicion for pancreatic head malignancy. Melissa [name] biliary stent passes through this region of abnormal accentuated metabolic activity. Hypermetabolic adjacent peripancreatic retroperitoneal lymph nodes. For example, a 1.3 cm peripancreatic lymph node a left periaortic lymph node measuring 1.4 cm in short axis on image 148/3 has a maximum SUV of 5.2. Other hypermetabolic porta hepatis and retroperitoneal adenopathy is also present. No hypermetabolic hepatic mass is observed. Incidental CT findings: Moderately distended gallbladder containing numerous small gallstones. Trace amount of perihepatic fluid. Mild stranding around Melissa [name] pancreatic head and porta hepatis region and extending into Melissa [name] root of Melissa [name] mesentery. Mild hypodensity along Melissa [name] pancreatic tail on image 142/3 corresponding to a small fluid density lesion shown on prior CT scan  in this vicinity. SKELETON: Mild accentuated activity along Melissa [name] left upper sacral ala and adjacent iliac bone. Melissa [name] sacral ala has a maximum SUV of 4.8 with Melissa [name] iliac bone having a maximum SUV of 4.7. Very faint sclerosis in Melissa [name] medullary space of Melissa [name] right proximal femur associated with accentuated metabolic activity, maximum SUV 4.0, this lesion measured 1.1 cm in diameter on image 246/3. Incidental CT findings: Left hip hemiarthroplasty. Lower lumbar degenerative facet arthropathy. Thoracic spondylosis. IMPRESSION: 1. Abnormal prominence of Melissa [name] pancreatic head with indistinctness of associated tissue planes and abnormal hypermetabolic activity with maximum SUV up to 8.7, compatible with malignancy. Pancreatic adenocarcinoma with certainly be a top differential diagnostic consideration. 2. Surrounding peripancreatic, porta hepatis, and retroperitoneal lymph nodes are hypermetabolic indicating malignant involvement. 3. Is a faintly sclerotic 1.1 cm lesion in Melissa [name] right proximal femur with mild hypermetabolic activity, maximum SUV 4.0. Uncertain significance, but Melissa [name] possibility of osseous metastatic disease is raised. There are also indistinctly marginated areas of accentuated metabolic activity in Melissa [name] left sacral ala and adjacent iliac bone which could be inflammatory from mild sacroiliitis or due to malignancy, but these lesions do not have CT correlate. 4. Distended and mildly thick-walled gallbladder with numerous gallstones. A biliary stent is in place. Strictly speaking I cannot exclude acute cholecystitis. 5. Other imaging findings of potential clinical significance: Chronic right maxillary sinusitis. Aortic Atherosclerosis (ICD10-I70.0). Electronically Signed   By: Van Clines M.D.   On: 11/09/2018 13:22   Dg Chest Port 1 View  Result Date: 11/26/2018 CLINICAL DATA:  Porta athlete's min EXAM: PORTABLE CHEST 1 VIEW COMPARISON:  Portable exam 1426 hours  compared to 01/01/2016 FINDINGS: RIGHT jugular  Port-A-Cath with tip projecting over SVC. Upper normal heart size. Mediastinal contours and pulmonary vascularity normal. Subsegmental atelectasis retrocardiac LEFT lower lobe. Minimal dependent atelectasis at RIGHT base. No infiltrate, pleural effusion or pneumothorax. Multilevel endplate spur formation thoracic spine. IMPRESSION: Bibasilar atelectasis. No pneumothorax following Port-A-Cath insertion. Electronically Signed   By: Lavonia Dana M.D.   On: 11/26/2018 14:51   Dg C-arm 1-60 Min-no Report  Result Date: 11/26/2018 Fluoroscopy was utilized by Melissa [name] requesting physician.  No radiographic interpretation.   Dg C-arm 1-60 Min-no Report  Result Date: 10/29/2018 Fluoroscopy was utilized by Melissa [name] requesting physician.  No radiographic interpretation.   US Abdomen Limited Ruq  Result Date: 10/29/2018 CLINICAL DATA:  Worsening jaundice. EXAM: ULTRASOUND ABDOMEN LIMITED RIGHT UPPER QUADRANT COMPARISON:  CT scan of October 20, 2018. FINDINGS: Gallbladder: Mild gallbladder distention is noted. Cholelithiasis is noted with mild gallbladder wall thickening and pericholecystic fluid. Some degree of sludge is present as well. No sonographic Murphy's sign is noted. Common bile duct: Diameter: Measures 17 mm proximally and 13 mm distally, consistent with distal common bile duct obstruction. Liver: No focal lesion identified. Within normal limits in parenchymal echogenicity. Intrahepatic biliary dilatation is noted. Portal vein is patent on color Doppler imaging with normal direction of blood flow towards Melissa [name] liver. Other: None. IMPRESSION: Cholelithiasis is noted with mild gallbladder wall thickening and pericholecystic fluid, and some degree of sludge within gallbladder lumen. Mild gallbladder distention is noted. Cholecystitis cannot be excluded and clinical correlation is recommended. Significant intrahepatic and extrahepatic biliary dilatation is noted concerning for distal common bile duct obstruction, potentially  due to pancreatic neoplasm as described on prior CT scan. Electronically Signed   By: Marijo Conception M.D.   On: 10/29/2018 12:55    [facility] [facility] labs CA 19-9 was check on 10/21/2018, level was elevated at 1500.    ASSESSMENT & PLAN:  1. Malignant neoplasm of head of pancreas ([place])   2. Encounter for antineoplastic chemotherapy   3. Weight loss   4. Normocytic anemia   5. Hypokalemia   6. Neoplasm related pain   . # Stage IV pancreatic cancer- Labs are reviewed and discussed with Melissa [name] [name] CA19.9 1500--> 2101-->1614-->  Counts acceptable to proceed with cycle 2-day 8 gemcitabine and Abraxane. She will have 1 week off. Follow-up in 2 weeks to start cycle 3 gemcitabine and Abraxane.  #Grade 1 neuropathy.  Abraxane has been decreased to 100 mg/m.  Stable symptoms. #Normocytic anemia, hemoglobin 12.2 today stable.  Continue to monitor. #Neoplasm related pain, well controlled.  Denies any pain today.  Melissa [name] [name] has oxycodone 5 mg every 6 hours as needed prescription at home if she needs. #Hypokalemia, potassium 3.6 today.  Continue oral potassium supplementation. #Transaminitis, improved continue to monitor. #Weight loss, has been referred to dietitian. Follow-up with palliative service to establish care. #Discussed about genetic testing.  Melissa [name] [name] has been referred to genetic testing.   All questions were answered. Melissa [name] Melissa [name] [name] knows to call Melissa [name] clinic with any problems questions or concerns.  cc Adin Hector, MD   Return of visit: 2 weeks for evaluation prior to cycle 3 chemotherapy. We spent sufficient time to discuss many aspect of care, questions were answered to Melissa [name] [name]'s satisfaction.  Earlie Server, MD, PhD Hematology Oncology [place] [facility] 12/28/2018

## 2018-12-29 NOTE — Progress Notes (Signed)
Per MD, abraxane dose of 100mg /m2 and gemzar dose of 1000mg /m2 will continue for future doses and cycles.

## 2019-01-04 ENCOUNTER — Encounter: Payer: Medicare Other | Admitting: Hospice and Palliative Medicine

## 2019-01-10 ENCOUNTER — Inpatient Hospital Stay: Payer: Medicare Other

## 2019-01-10 NOTE — Progress Notes (Signed)
Nutrition  Called patient for nutrition follow-up.  No answer.  Left message with call back number.   Tora Prunty B. Jakyri Brunkhorst, RD, LDN Registered Dietitian [phone] (pager)   

## 2019-01-11 ENCOUNTER — Inpatient Hospital Stay: Payer: Medicare Other | Admitting: Hospice and Palliative Medicine

## 2019-01-11 ENCOUNTER — Inpatient Hospital Stay: Payer: Medicare Other

## 2019-01-11 ENCOUNTER — Other Ambulatory Visit: Payer: Self-pay

## 2019-01-11 ENCOUNTER — Encounter: Payer: Self-pay | Admitting: Oncology

## 2019-01-11 ENCOUNTER — Inpatient Hospital Stay (HOSPITAL_BASED_OUTPATIENT_CLINIC_OR_DEPARTMENT_OTHER): Payer: Medicare Other | Admitting: Oncology

## 2019-01-11 VITALS — BP 116/66 | HR 73 | Temp 98.2°F | Resp 18 | Wt 142.9 lb

## 2019-01-11 DIAGNOSIS — C250 Malignant neoplasm of head of pancreas: Secondary | ICD-10-CM

## 2019-01-11 DIAGNOSIS — Z5111 Encounter for antineoplastic chemotherapy: Secondary | ICD-10-CM

## 2019-01-11 DIAGNOSIS — G629 Polyneuropathy, unspecified: Secondary | ICD-10-CM | POA: Diagnosis not present

## 2019-01-11 DIAGNOSIS — Z23 Encounter for immunization: Secondary | ICD-10-CM

## 2019-01-11 DIAGNOSIS — D649 Anemia, unspecified: Secondary | ICD-10-CM

## 2019-01-11 DIAGNOSIS — D490 Neoplasm of unspecified behavior of digestive system: Secondary | ICD-10-CM

## 2019-01-11 DIAGNOSIS — R634 Abnormal weight loss: Secondary | ICD-10-CM | POA: Diagnosis not present

## 2019-01-11 LAB — CBC WITH DIFFERENTIAL/PLATELET
Abs Immature Granulocytes: 0.03 10*3/uL (ref 0.00–0.07)
Basophils Absolute: 0 10*3/uL (ref 0.0–0.1)
Basophils Relative: 0 %
Eosinophils Absolute: 0.1 10*3/uL (ref 0.0–0.5)
Eosinophils Relative: 1 %
HCT: 35.5 % — ABNORMAL LOW (ref 36.0–46.0)
Hemoglobin: 11.8 g/dL — ABNORMAL LOW (ref 12.0–15.0)
Immature Granulocytes: 0 %
Lymphocytes Relative: 9 %
Lymphs Abs: 0.9 10*3/uL (ref 0.7–4.0)
MCH: 34 pg (ref 26.0–34.0)
MCHC: 33.2 g/dL (ref 30.0–36.0)
MCV: 102.3 fL — ABNORMAL HIGH (ref 80.0–100.0)
Monocytes Absolute: 1.2 10*3/uL — ABNORMAL HIGH (ref 0.1–1.0)
Monocytes Relative: 13 %
Neutro Abs: 7 10*3/uL (ref 1.7–7.7)
Neutrophils Relative %: 77 %
Platelets: 377 10*3/uL (ref 150–400)
RBC: 3.47 MIL/uL — ABNORMAL LOW (ref 3.87–5.11)
RDW: 16.2 % — ABNORMAL HIGH (ref 11.5–15.5)
WBC: 9.2 10*3/uL (ref 4.0–10.5)
nRBC: 0 % (ref 0.0–0.2)

## 2019-01-11 LAB — COMPREHENSIVE METABOLIC PANEL
ALT: 17 U/L (ref 0–44)
AST: 21 U/L (ref 15–41)
Albumin: 3.7 g/dL (ref 3.5–5.0)
Alkaline Phosphatase: 161 U/L — ABNORMAL HIGH (ref 38–126)
Anion gap: 7 (ref 5–15)
BUN: 15 mg/dL (ref 8–23)
CO2: 25 mmol/L (ref 22–32)
Calcium: 9.2 mg/dL (ref 8.9–10.3)
Chloride: 103 mmol/L (ref 98–111)
Creatinine, Ser: 0.5 mg/dL (ref 0.44–1.00)
GFR calc Af Amer: >60 mL/min (ref >60)
GFR calc non Af Amer: >60 mL/min (ref >60)
Glucose, Bld: 123 mg/dL — ABNORMAL HIGH (ref 70–99)
Potassium: 4 mmol/L (ref 3.5–5.1)
Sodium: 135 mmol/L (ref 135–145)
Total Bilirubin: 0.5 mg/dL (ref 0.3–1.2)
Total Protein: 7.5 g/dL (ref 6.5–8.1)

## 2019-01-11 MED ORDER — SODIUM CHLORIDE 0.9% FLUSH
10.0000 mL | INTRAVENOUS | Status: DC | PRN
  Administered 2019-01-11: 10:00:00 10 mL
  Filled 2019-01-11: qty 10

## 2019-01-11 MED ORDER — SODIUM CHLORIDE 0.9 % IV SOLN
Freq: Once | INTRAVENOUS | Status: AC
  Administered 2019-01-11: 10:00:00 via INTRAVENOUS
  Filled 2019-01-11: qty 250

## 2019-01-11 MED ORDER — INFLUENZA VAC SPLIT QUAD 0.5 ML IM SUSY
0.5000 mL | PREFILLED_SYRINGE | Freq: Once | INTRAMUSCULAR | Status: AC
  Administered 2019-01-11: 0.5 mL via INTRAMUSCULAR
  Filled 2019-01-11: qty 0.5

## 2019-01-11 MED ORDER — PACLITAXEL PROTEIN-BOUND CHEMO INJECTION 100 MG
100.0000 mg/m2 | Freq: Once | INTRAVENOUS | Status: AC
  Administered 2019-01-11: 10:00:00 175 mg via INTRAVENOUS
  Filled 2019-01-11: qty 35

## 2019-01-11 MED ORDER — SODIUM CHLORIDE 0.9 % IV SOLN
1600.0000 mg | Freq: Once | INTRAVENOUS | Status: AC
  Administered 2019-01-11: 11:00:00 1600 mg via INTRAVENOUS
  Filled 2019-01-11: qty 26.3

## 2019-01-11 MED ORDER — PROCHLORPERAZINE MALEATE 10 MG PO TABS
10.0000 mg | ORAL_TABLET | Freq: Once | ORAL | Status: AC
  Administered 2019-01-11: 10:00:00 10 mg via ORAL
  Filled 2019-01-11: qty 1

## 2019-01-11 MED ORDER — HEPARIN SOD (PORK) LOCK FLUSH 100 UNIT/ML IV SOLN
500.0000 [IU] | Freq: Once | INTRAVENOUS | Status: AC | PRN
  Administered 2019-01-11: 12:00:00 500 [IU]
  Filled 2019-01-11: qty 5

## 2019-01-11 NOTE — Progress Notes (Signed)
Hematology/Oncology follow up note [facility] [phone] [phone]   Patient Care Team: Melissa Hector, MD as PCP - General (Internal Medicine) Melissa Jacks, RN as Oncology Nurse Navigator  REFERRING PROVIDER: Adin Hector, MD  CHIEF COMPLAINTS/REASON FOR VISIT:  Follow-up for pancreatic cancer   HISTORY OF PRESENTING ILLNESS:   Melissa [name] is a  78 y.o.  female with PMH listed below was seen in consultation at the request of  Melissa Hector, MD  for evaluation of abnormal pancreatic disease. Patient was recently seen by primary care provider Melissa [name] for evaluation of nausea, diarrhea and jaundice.  Blood work on 10/19/2018 showed potassium 2.9, bilirubin 15, alkaline phosphatase 478, AST 114, ALT 95. Abdomen pelvis CT scan on 10/20/2018 showed moderate to market intra-and extra hepatic biliary dilation with mild diffuse dilatation of the pancreatic duct. Patient has had poor appetite and has lost a 5 to 10 pounds recently. She also had diarrhea.  She noticed sand like stool for 1- 2 weeks.  She takes care of her 17 year old mother and recently feels she is not able to take care of her anymore due to progressively worsening weakness and fatigue.  She placed her mother to assisted living yesterday. She has had discussion with Melissa [name] about her blood work and CT scans.  She understands that there is a strong suspicion of cancer.  Nonessential medication has been stopped. CA 19-9 was check on 10/21/2018, level was elevated at 1500.  Today she denies any pain.  Itchy all over her body.. Per patient's request, patient's daughter Melissa [name] was Melissa [name] and Melissa [name] was able to hear entire clinical encounter conversation and participate in the reported history and discussion.  # Patient was admitted from 10/29/2018-10/31/2018.  Status post ERCP and biliary stenting.  Patient had duodenum ampulla biopsy showed nondiagnostic.  Negative for  invasive carcinoma. Bilirubin trended down to 11 at the time of discharge on 10/31/2018.  #PET scan images were independent reviewed by me and discussed with patient. cTxN2 M1a Patient has at least M1 a disease due to retroperitoneal/peri-aortic lymph node involvement,  sclerotic lesion of right proximal femur with mild hypermetabolic activity, as well as left sacrum  # EUS biopsy of pancreatic mass showed positive for malignancy, Adenocarcinoma. There is not enough tissue for NGS testing.  Attempt to at MMR, still not enough tissue to complete the testing.  INTERVAL HISTORY Melissa [name] is a 78 y.o. female who has above history reviewed by me today presents for follow up visit for management of stage IV pancreatic cancer Patient has had 2 cycles of gemcitabine and Abraxane.  Today is cycle 3-day 1 treatment. Patient reports feeling tired and fatigued.  Fatigue is worse after chemotherapy.  Otherwise doing well. Appetite has been fair. Patient is on Eliquis 5 mg twice daily for anticoagulation of ovarian vein thrombosis.   Review of Systems  Constitutional: Positive for appetite change, fatigue and unexpected weight change. Negative for chills and fever.  HENT:   Negative for hearing loss and voice change.   Eyes: Negative for eye problems and icterus.  Respiratory: Negative for chest tightness and cough.   Cardiovascular: Negative for chest pain.  Gastrointestinal: Negative for abdominal distention, abdominal pain and blood in stool.  Endocrine: Negative for hot flashes.  Genitourinary: Negative for difficulty urinating and frequency.   Musculoskeletal: Negative for arthralgias.  Skin: Negative for itching and rash.  Neurological: Negative for extremity weakness.  Hematological:  Negative for adenopathy.  Psychiatric/Behavioral: Negative for confusion.    MEDICAL HISTORY:  Past Medical History:  Diagnosis Date   Glaucoma    Hypertension    Osteopenia    Stroke [facility])       SURGICAL HISTORY: Past Surgical History:  Procedure Laterality Date   BREAST BIOPSY Right    core- stereo- neg   ERCP N/A 10/29/2018   Procedure: ENDOSCOPIC RETROGRADE CHOLANGIOPANCREATOGRAPHY (ERCP);  Surgeon: [name], MD;  Location: [facility] ENDOSCOPY;  Service: Endoscopy;  Laterality: N/A;   EUS N/A 11/18/2018   Procedure: FULL UPPER ENDOSCOPIC ULTRASOUND (EUS) RADIAL;  Surgeon: Holly Bodily, MD;  Location: [facility] ENDOSCOPY;  Service: Gastroenterology;  Laterality: N/A;   HIP ARTHROPLASTY Left 01/02/2016   Procedure: ARTHROPLASTY BIPOLAR HIP (HEMIARTHROPLASTY);  Surgeon: Dereck Leep, MD;  Location: [facility] ORS;  Service: Orthopedics;  Laterality: Left;   JOINT REPLACEMENT Left    THR   PORTACATH PLACEMENT Right 11/26/2018   Procedure: INSERTION PORT-A-CATH;  Surgeon: Jules Husbands, MD;  Location: [facility] ORS;  Service: General;  Laterality: Right;   TONSILLECTOMY      SOCIAL HISTORY: Social History   Socioeconomic History   Marital status: Married    Spouse name: Not on file   Number of children: Not on file   Years of education: Not on file   Highest education level: Not on file  Occupational History   Not on file  Social Needs   Financial resource strain: Not on file   Food insecurity    Worry: Not on file    Inability: Not on file   Transportation needs    Medical: Not on file    Non-medical: Not on file  Tobacco Use   Smoking status: Never Smoker   Smokeless tobacco: Never Used  Substance and Sexual Activity   Alcohol use: No   Drug use: Never   Sexual activity: Not on file  Lifestyle   Physical activity    Days per week: Not on file    Minutes per session: Not on file   Stress: Not on file  Relationships   Social connections    Talks on phone: Not on file    Gets together: Not on file    Attends religious service: Not on file    Active member of club or organization: Not on file    Attends meetings of clubs or organizations:  Not on file    Relationship status: Not on file   Intimate partner violence    Fear of current or ex partner: Not on file    Emotionally abused: Not on file    Physically abused: Not on file    Forced sexual activity: Not on file  Other Topics Concern   Not on file  Social History Narrative   Not on file    FAMILY HISTORY: Family History  Problem Relation Age of Onset   Kidney disease Father    Breast cancer Neg Hx     ALLERGIES:  has No Known Allergies.  MEDICATIONS:  Current Outpatient Medications  Medication Sig Dispense Refill   acetaminophen (TYLENOL) 500 MG tablet Take 500 mg by mouth every 6 (six) hours as needed.     amLODipine (NORVASC) 10 MG tablet Take 10 mg by mouth at bedtime.      docusate sodium (COLACE) 100 MG capsule Take 1 capsule (100 mg total) by mouth daily as needed for mild constipation or moderate constipation. Do not take if you have loose stools  60 capsule 2   lidocaine-prilocaine (EMLA) cream Apply to affected area once 30 g 3   ondansetron (ZOFRAN) 8 MG tablet Take 1 tablet by mouth as needed.     oxyCODONE (ROXICODONE) 5 MG immediate release tablet Take 1 tablet (5 mg total) by mouth every 6 (six) hours as needed for moderate pain or severe pain. 60 tablet 0   potassium chloride (K-DUR) 10 MEQ tablet Take 1 tablet (10 mEq total) by mouth daily. (Patient taking differently: Take 10 mEq by mouth 2 (two) times daily. ) 30 tablet 0   prochlorperazine (COMPAZINE) 10 MG tablet Take 1 tablet by mouth as needed.     No current facility-administered medications for this visit.      PHYSICAL EXAMINATION: ECOG PERFORMANCE STATUS: 2 - Symptomatic, <50% confined to bed Vitals:   01/11/19 0846  BP: 116/66  Pulse: 73  Resp: 18  Temp: 98.2 F (36.8 C)   Filed Weights   01/11/19 0846  Weight: 142 lb 14.4 oz (64.8 kg)    Physical Exam Constitutional:      General: She is not in acute distress.    Comments: Frail, sitting in wheelchair.    HENT:     Head: Normocephalic and atraumatic.  Eyes:     General: No scleral icterus.    Pupils: Pupils are equal, round, and reactive to light.  Neck:     Musculoskeletal: Normal range of motion and neck supple.  Cardiovascular:     Rate and Rhythm: Normal rate and regular rhythm.     Heart sounds: Normal heart sounds.  Pulmonary:     Effort: Pulmonary effort is normal. No respiratory distress.     Breath sounds: Normal breath sounds. No wheezing.  Abdominal:     General: Bowel sounds are normal. There is no distension.     Palpations: Abdomen is soft. There is no mass.     Tenderness: There is no abdominal tenderness.  Musculoskeletal: Normal range of motion.        General: No deformity.  Skin:    General: Skin is warm and dry.     Coloration: Skin is pale. Skin is not jaundiced.     Findings: No erythema or rash.  Neurological:     Mental Status: She is alert and oriented to person, place, and time.     Cranial Nerves: No cranial nerve deficit.     Coordination: Coordination normal.  Psychiatric:        Behavior: Behavior normal.        Thought Content: Thought content normal.     LABORATORY DATA:  I have reviewed the data as listed Lab Results  Component Value Date   WBC 9.2 01/11/2019   HGB 11.8 (L) 01/11/2019   HCT 35.5 (L) 01/11/2019   MCV 102.3 (H) 01/11/2019   PLT 377 01/11/2019   Recent Labs    10/29/18 1318  12/21/18 0817 12/28/18 0842 01/11/19 0818  NA 135   < > 139 138 135  K 3.0*   < > 3.6 3.6 4.0  CL 104   < > 106 103 103  CO2 21*   < > '24 25 25  ' GLUCOSE 110*   < > 133* 158* 123*  BUN 13   < > '8 12 15  ' CREATININE 0.32*   < > 0.61 0.69 0.50  CALCIUM 9.0   < > 9.3 9.6 9.2  GFRNONAA >60   < > >60 >60 >60  GFRAA >60   < > >  60 >60 >60  PROT 7.0   < > 8.2* 8.1 7.5  ALBUMIN 2.6*   < > 3.5 3.9 3.7  AST 119*   < > 76* 44* 21  ALT 79*   < > 54* 41 17  ALKPHOS 399*   < > 392* 219* 161*  BILITOT 18.2*   < > 0.7 0.6 0.5  BILIDIR 10.2*  --   --    --   --    < > = values in this interval not displayed.   Iron/TIBC/Ferritin/ %Sat No results found for: IRON, TIBC, FERRITIN, IRONPCTSAT   RADIOGRAPHIC STUDIES: I have personally reviewed the radiological images as listed and agreed with the findings in the report. Mr Jeri Cos Wo Contrast  Result Date: 11/26/2018 CLINICAL DATA:  History of pancreas cancer with new onset seizure EXAM: MRI HEAD WITHOUT AND WITH CONTRAST TECHNIQUE: Multiplanar, multiecho pulse sequences of the brain and surrounding structures were obtained without and with intravenous contrast. CONTRAST:  42m GADAVIST GADOBUTROL 1 MMOL/ML IV SOLN COMPARISON:  08/06/2006 FINDINGS: Brain: No focal cortical finding to explain seizure. No mass or swelling to suggest metastatic disease. Remote lacunar infarct in the left thalamus. Cerebral atrophy with nonspecific pattern. Mild small vessel ischemic gliosis in the white matter. No hydrocephalus or collection. Vascular: Major flow voids and vascular enhancements are preserved Skull and upper cervical spine: Negative for marrow lesion. Cervical facet spurring. Sinuses/Orbits: Negative IMPRESSION: No evidence of metastatic disease and no cortical finding to explain seizure. Electronically Signed   By: JMonte FantasiaM.D.   On: 11/26/2018 11:34   Ct Abdomen Pelvis W Contrast  Result Date: 10/20/2018 CLINICAL DATA:  Jaundice EXAM: CT ABDOMEN AND PELVIS WITH CONTRAST TECHNIQUE: Multidetector CT imaging of the abdomen and pelvis was performed using the standard protocol following bolus administration of intravenous contrast. CONTRAST:  1033mOMNIPAQUE IOHEXOL 300 MG/ML  SOLN COMPARISON:  None. FINDINGS: Lower chest: Lung bases demonstrate no acute consolidation or effusion. The heart size is within normal limits. Small hiatal hernia. Hepatobiliary: Subcentimeter hypodensity left hepatic lobe, too small to further characterize but probably a small cyst. Moderate intra hepatic biliary dilatation.  Enlarged extrahepatic common bile duct, measuring up to 16 mm in diameter on coronal views. Distended gallbladder with numerous calcified stones. There may be slight gallbladder wall thickening or edema. Pancreas: Mild diffuse enlargement of the pancreatic head which appears somewhat indistinct. Mild hazy edema around the pancreatic head. Suspect indistinct mass at the head of pancreas. Mild pancreatic ductal dilatation. 11 mm indeterminate cystic lesion distal body/tail of pancreas, series 2, image number 27. Spleen: Normal in size without focal abnormality. Adrenals/Urinary Tract: Adrenal glands are unremarkable. Kidneys are normal, without renal calculi, focal lesion, or hydronephrosis. Bladder is unremarkable. Stomach/Bowel: Stomach is within normal limits. Appendix appears normal. No evidence of bowel wall thickening, distention, or inflammatory changes. Vascular/Lymphatic: Nonaneurysmal aorta. Mild aortic atherosclerosis. Multiple enlarged peripancreatic and porta hepatis lymph nodes measuring up to 2.2 cm, coronal series 5, image number 34. Enlarged retroperitoneal/Peri aortic lymph nodes, measuring up to 2.2 cm. Central low density within the periaortic nodes, possibly due to necrosis. Reproductive: Uterus and bilateral adnexa are unremarkable. Other: Negative for free air or free fluid. Small fat in the umbilical region. Musculoskeletal: No acute or significant osseous findings. IMPRESSION: 1. Moderate to marked intra and extrahepatic biliary dilatation with mild diffuse dilatation of the pancreatic duct. Diffuse enlargement and indistinct appearance of the pancreatic head, constellation of findings is suspect for biliary obstruction secondary  to malignant stricture/possible pancreatic head mass. Additional finding of small cystic lesions within the distal body of the pancreas which could be more thoroughly evaluated with MRI 2. Multiple calcified gallstones. Gallbladder slightly enlarged and there may be  slight gallbladder wall edema or thickening 3. Multiple enlarged porta hepatis, peripancreatic, and retroperitoneal nodes, raising concern for metastatic disease. Electronically Signed   By: Donavan Foil M.D.   On: 10/20/2018 18:41   Nm Pet Image Initial (pi) Skull Base To Thigh  Result Date: 11/09/2018 CLINICAL DATA:  Initial treatment strategy for pancreatic mass. EXAM: NUCLEAR MEDICINE PET SKULL BASE TO THIGH TECHNIQUE: 8.4 mCi F-18 FDG was injected intravenously. Full-ring PET imaging was performed from the skull base to thigh after the radiotracer. CT data was obtained and used for attenuation correction and anatomic localization. Fasting blood glucose: 131 mg/dl COMPARISON:  CT abdomen 10/20/2018 FINDINGS: Mediastinal blood pool activity: SUV max 2.3 Liver activity: SUV max 3.5 NECK: No significant abnormal hypermetabolic activity in this region. Incidental CT findings: Chronic right maxillary sinusitis. CHEST: No significant abnormal hypermetabolic activity in this region. Incidental CT findings: Atherosclerotic calcification of the aortic arch and branch vessels. ABDOMEN/PELVIS: Abnormal accentuated activity in the pancreatic head, maximum SUV 8.7, high suspicion for pancreatic head malignancy. The biliary stent passes through this region of abnormal accentuated metabolic activity. Hypermetabolic adjacent peripancreatic retroperitoneal lymph nodes. For example, a 1.3 cm peripancreatic lymph node a left periaortic lymph node measuring 1.4 cm in short axis on image 148/3 has a maximum SUV of 5.2. Other hypermetabolic porta hepatis and retroperitoneal adenopathy is also present. No hypermetabolic hepatic mass is observed. Incidental CT findings: Moderately distended gallbladder containing numerous small gallstones. Trace amount of perihepatic fluid. Mild stranding around the pancreatic head and porta hepatis region and extending into the root of the mesentery. Mild hypodensity along the pancreatic tail on  image 142/3 corresponding to a small fluid density lesion shown on prior CT scan in this vicinity. SKELETON: Mild accentuated activity along the left upper sacral ala and adjacent iliac bone. The sacral ala has a maximum SUV of 4.8 with the iliac bone having a maximum SUV of 4.7. Very faint sclerosis in the medullary space of the right proximal femur associated with accentuated metabolic activity, maximum SUV 4.0, this lesion measured 1.1 cm in diameter on image 246/3. Incidental CT findings: Left hip hemiarthroplasty. Lower lumbar degenerative facet arthropathy. Thoracic spondylosis. IMPRESSION: 1. Abnormal prominence of the pancreatic head with indistinctness of associated tissue planes and abnormal hypermetabolic activity with maximum SUV up to 8.7, compatible with malignancy. Pancreatic adenocarcinoma with certainly be a top differential diagnostic consideration. 2. Surrounding peripancreatic, porta hepatis, and retroperitoneal lymph nodes are hypermetabolic indicating malignant involvement. 3. Is a faintly sclerotic 1.1 cm lesion in the right proximal femur with mild hypermetabolic activity, maximum SUV 4.0. Uncertain significance, but the possibility of osseous metastatic disease is raised. There are also indistinctly marginated areas of accentuated metabolic activity in the left sacral ala and adjacent iliac bone which could be inflammatory from mild sacroiliitis or due to malignancy, but these lesions do not have CT correlate. 4. Distended and mildly thick-walled gallbladder with numerous gallstones. A biliary stent is in place. Strictly speaking I cannot exclude acute cholecystitis. 5. Other imaging findings of potential clinical significance: Chronic right maxillary sinusitis. Aortic Atherosclerosis (ICD10-I70.0). Electronically Signed   By: Van Clines M.D.   On: 11/09/2018 13:22   Dg Chest Port 1 View  Result Date: 11/26/2018 CLINICAL DATA:  Porta athlete's min  EXAM: PORTABLE CHEST 1 VIEW  COMPARISON:  Portable exam 1426 hours compared to 01/01/2016 FINDINGS: RIGHT jugular Port-A-Cath with tip projecting over SVC. Upper normal heart size. Mediastinal contours and pulmonary vascularity normal. Subsegmental atelectasis retrocardiac LEFT lower lobe. Minimal dependent atelectasis at RIGHT base. No infiltrate, pleural effusion or pneumothorax. Multilevel endplate spur formation thoracic spine. IMPRESSION: Bibasilar atelectasis. No pneumothorax following Port-A-Cath insertion. Electronically Signed   By: Lavonia Dana M.D.   On: 11/26/2018 14:51   Dg C-arm 1-60 Min-no Report  Result Date: 11/26/2018 Fluoroscopy was utilized by the requesting physician.  No radiographic interpretation.   Dg C-arm 1-60 Min-no Report  Result Date: 10/29/2018 Fluoroscopy was utilized by the requesting physician.  No radiographic interpretation.   US Abdomen Limited Ruq  Result Date: 10/29/2018 CLINICAL DATA:  Worsening jaundice. EXAM: ULTRASOUND ABDOMEN LIMITED RIGHT UPPER QUADRANT COMPARISON:  CT scan of October 20, 2018. FINDINGS: Gallbladder: Mild gallbladder distention is noted. Cholelithiasis is noted with mild gallbladder wall thickening and pericholecystic fluid. Some degree of sludge is present as well. No sonographic Murphy's sign is noted. Common bile duct: Diameter: Measures 17 mm proximally and 13 mm distally, consistent with distal common bile duct obstruction. Liver: No focal lesion identified. Within normal limits in parenchymal echogenicity. Intrahepatic biliary dilatation is noted. Portal vein is patent on color Doppler imaging with normal direction of blood flow towards the liver. Other: None. IMPRESSION: Cholelithiasis is noted with mild gallbladder wall thickening and pericholecystic fluid, and some degree of sludge within gallbladder lumen. Mild gallbladder distention is noted. Cholecystitis cannot be excluded and clinical correlation is recommended. Significant intrahepatic and extrahepatic biliary  dilatation is noted concerning for distal common bile duct obstruction, potentially due to pancreatic neoplasm as described on prior CT scan. Electronically Signed   By: Marijo Conception M.D.   On: 10/29/2018 12:55    [place] [facility] labs CA 19-9 was check on 10/21/2018, level was elevated at 1500.    ASSESSMENT & PLAN:  1. Encounter for antineoplastic chemotherapy   2. Malignant neoplasm of head of pancreas ([place])   3. Weight loss   4. Neuropathy   . # Stage IV pancreatic cancer- Labs are reviewed and discussed with patient CA19.9 1500--> 2101-->1614--> today CA 19-9 pending. Counts are reviewed and discussed with patient. Proceed with cycle 3-day 1 gemcitabine Abraxane treatment.  #Grade 1 neuropathy.  Abraxane has been decreased to 100 mg/m.  Neuropathy symptoms are stable. #Normocytic anemia, hemoglobin decreased likely secondary to chemotherapy.  Continue to monitor.  Discussed with patient that anticipate that she may feel more fatigued and tired as hemoglobin decreases.  #Neoplasm related pain, no pain today.  #Weight loss, patient has gained weight since last visit.  Continue follow-up with dietitian. Follow-up with palliative service to establish care. #Discussed about genetic testing.  Patient has been referred to genetic testing.   All questions were answered. The patient knows to call the clinic with any problems questions or concerns.  cc Melissa Hector, MD   Return of visit: 1 week We spent sufficient time to discuss many aspect of care, questions were answered to patient's satisfaction.  Earlie Server, MD, PhD Hematology Oncology [place] at [facility] 01/11/2019

## 2019-01-11 NOTE — Progress Notes (Signed)
Patient here for follow up and tx: gemzar & abraxane. No concerns voiced today.

## 2019-01-11 NOTE — Progress Notes (Signed)
Per MD, Dr. Tasia Catchings, order: give regular dose of flu vaccine (FLUARIX), do not order high dose.

## 2019-01-12 LAB — CANCER ANTIGEN 19-9: CA 19-9: 897 U/mL — ABNORMAL HIGH (ref 0–35)

## 2019-01-13 ENCOUNTER — Inpatient Hospital Stay (HOSPITAL_BASED_OUTPATIENT_CLINIC_OR_DEPARTMENT_OTHER): Payer: Medicare Other | Admitting: Hospice and Palliative Medicine

## 2019-01-13 DIAGNOSIS — Z515 Encounter for palliative care: Secondary | ICD-10-CM

## 2019-01-14 NOTE — Progress Notes (Signed)
Patient was scheduled for telephone visit today.  I called several times at time of visit but was unable to reach patient by phone.  Will reschedule visit.

## 2019-01-17 ENCOUNTER — Other Ambulatory Visit: Payer: Self-pay

## 2019-01-17 ENCOUNTER — Encounter: Payer: Self-pay | Admitting: Oncology

## 2019-01-17 NOTE — Progress Notes (Signed)
Patient prescreened for appointment. Patient has no concerns or questions.  

## 2019-01-18 ENCOUNTER — Inpatient Hospital Stay: Payer: Medicare Other | Attending: Oncology

## 2019-01-18 ENCOUNTER — Other Ambulatory Visit: Payer: Self-pay

## 2019-01-18 ENCOUNTER — Encounter: Payer: Self-pay | Admitting: Oncology

## 2019-01-18 ENCOUNTER — Inpatient Hospital Stay (HOSPITAL_BASED_OUTPATIENT_CLINIC_OR_DEPARTMENT_OTHER): Payer: Medicare Other | Admitting: Oncology

## 2019-01-18 ENCOUNTER — Inpatient Hospital Stay: Payer: Medicare Other

## 2019-01-18 VITALS — BP 126/79 | HR 77 | Temp 96.0°F | Resp 12 | Wt 138.3 lb

## 2019-01-18 DIAGNOSIS — D649 Anemia, unspecified: Secondary | ICD-10-CM

## 2019-01-18 DIAGNOSIS — R634 Abnormal weight loss: Secondary | ICD-10-CM | POA: Diagnosis not present

## 2019-01-18 DIAGNOSIS — C250 Malignant neoplasm of head of pancreas: Secondary | ICD-10-CM | POA: Diagnosis present

## 2019-01-18 DIAGNOSIS — G629 Polyneuropathy, unspecified: Secondary | ICD-10-CM

## 2019-01-18 DIAGNOSIS — Z5111 Encounter for antineoplastic chemotherapy: Secondary | ICD-10-CM | POA: Insufficient documentation

## 2019-01-18 DIAGNOSIS — D490 Neoplasm of unspecified behavior of digestive system: Secondary | ICD-10-CM | POA: Diagnosis not present

## 2019-01-18 LAB — COMPREHENSIVE METABOLIC PANEL
ALT: 26 U/L (ref 0–44)
AST: 38 U/L (ref 15–41)
Albumin: 3.7 g/dL (ref 3.5–5.0)
Alkaline Phosphatase: 156 U/L — ABNORMAL HIGH (ref 38–126)
Anion gap: 11 (ref 5–15)
BUN: 15 mg/dL (ref 8–23)
CO2: 23 mmol/L (ref 22–32)
Calcium: 9.6 mg/dL (ref 8.9–10.3)
Chloride: 104 mmol/L (ref 98–111)
Creatinine, Ser: 0.69 mg/dL (ref 0.44–1.00)
GFR calc Af Amer: >60 mL/min (ref >60)
GFR calc non Af Amer: >60 mL/min (ref >60)
Glucose, Bld: 149 mg/dL — ABNORMAL HIGH (ref 70–99)
Potassium: 3.8 mmol/L (ref 3.5–5.1)
Sodium: 138 mmol/L (ref 135–145)
Total Bilirubin: 0.5 mg/dL (ref 0.3–1.2)
Total Protein: 7.7 g/dL (ref 6.5–8.1)

## 2019-01-18 LAB — CBC WITH DIFFERENTIAL/PLATELET
Abs Immature Granulocytes: 0.11 10*3/uL — ABNORMAL HIGH (ref 0.00–0.07)
Basophils Absolute: 0 10*3/uL (ref 0.0–0.1)
Basophils Relative: 1 %
Eosinophils Absolute: 0.1 10*3/uL (ref 0.0–0.5)
Eosinophils Relative: 1 %
HCT: 35.6 % — ABNORMAL LOW (ref 36.0–46.0)
Hemoglobin: 11.8 g/dL — ABNORMAL LOW (ref 12.0–15.0)
Immature Granulocytes: 2 %
Lymphocytes Relative: 17 %
Lymphs Abs: 1 10*3/uL (ref 0.7–4.0)
MCH: 33.4 pg (ref 26.0–34.0)
MCHC: 33.1 g/dL (ref 30.0–36.0)
MCV: 100.8 fL — ABNORMAL HIGH (ref 80.0–100.0)
Monocytes Absolute: 0.6 10*3/uL (ref 0.1–1.0)
Monocytes Relative: 11 %
Neutro Abs: 3.9 10*3/uL (ref 1.7–7.7)
Neutrophils Relative %: 68 %
Platelets: 263 10*3/uL (ref 150–400)
RBC: 3.53 MIL/uL — ABNORMAL LOW (ref 3.87–5.11)
RDW: 15.5 % (ref 11.5–15.5)
WBC: 5.7 10*3/uL (ref 4.0–10.5)
nRBC: 0.5 % — ABNORMAL HIGH (ref 0.0–0.2)

## 2019-01-18 MED ORDER — SODIUM CHLORIDE 0.9 % IV SOLN
1600.0000 mg | Freq: Once | INTRAVENOUS | Status: AC
  Administered 2019-01-18: 1600 mg via INTRAVENOUS
  Filled 2019-01-18: qty 26.3

## 2019-01-18 MED ORDER — HEPARIN SOD (PORK) LOCK FLUSH 100 UNIT/ML IV SOLN
500.0000 [IU] | Freq: Once | INTRAVENOUS | Status: AC
  Administered 2019-01-18: 13:00:00 500 [IU] via INTRAVENOUS
  Filled 2019-01-18: qty 5

## 2019-01-18 MED ORDER — PACLITAXEL PROTEIN-BOUND CHEMO INJECTION 100 MG
100.0000 mg/m2 | Freq: Once | INTRAVENOUS | Status: AC
  Administered 2019-01-18: 175 mg via INTRAVENOUS
  Filled 2019-01-18: qty 35

## 2019-01-18 MED ORDER — PROCHLORPERAZINE MALEATE 10 MG PO TABS
10.0000 mg | ORAL_TABLET | Freq: Once | ORAL | Status: AC
  Administered 2019-01-18: 10 mg via ORAL
  Filled 2019-01-18: qty 1

## 2019-01-18 MED ORDER — SODIUM CHLORIDE 0.9% FLUSH
10.0000 mL | INTRAVENOUS | Status: DC | PRN
  Administered 2019-01-18: 10 mL via INTRAVENOUS
  Filled 2019-01-18: qty 10

## 2019-01-18 MED ORDER — SODIUM CHLORIDE 0.9 % IV SOLN
Freq: Once | INTRAVENOUS | Status: AC
  Administered 2019-01-18: 10:00:00 via INTRAVENOUS
  Filled 2019-01-18: qty 250

## 2019-01-18 NOTE — Progress Notes (Signed)
Hematology/Oncology follow up note [facility] Telephone:([phone] Fax:([phone]) [phone]   Patient Care Team: Adin Hector, MD as PCP - General (Internal Medicine) Clent Jacks, RN as Oncology Nurse Navigator  REFERRING PROVIDER: Adin Hector, MD  CHIEF COMPLAINTS/REASON FOR VISIT:  Follow-up for pancreatic cancer   HISTORY OF PRESENTING ILLNESS:   Melissa [name] is a  78 y.o.  female with PMH listed below was seen in consultation at the request of  Adin Hector, MD  for evaluation of abnormal pancreatic disease. Patient was recently seen by primary care provider Dr. Caryl Comes for evaluation of nausea, diarrhea and jaundice.  Blood work on 10/19/2018 showed potassium 2.9, bilirubin 15, alkaline phosphatase 478, AST 114, ALT 95. Abdomen pelvis CT scan on 10/20/2018 showed moderate to market intra-and extra hepatic biliary dilation with mild diffuse dilatation of the pancreatic duct. Patient has had poor appetite and has lost a 5 to 10 pounds recently. She also had diarrhea.  She noticed sand like stool for 1- 2 weeks.  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  She placed her mother to assisted living yesterday. She has had discussion with Dr. Caryl Comes about her blood work and CT scans.  She understands that there is a strong suspicion of cancer.  Nonessential medication has been stopped. CA 19-9 was check on 10/21/2018, level was elevated at 1500.  Today she denies any pain.  Itchy all over her body.. Per patient's request, patient's daughter [name] was [name] and [name] was able to hear entire clinical encounter conversation and participate in the reported history and discussion.  # Patient was admitted from 10/29/2018-10/31/2018.  Status post ERCP and biliary stenting.  Patient had duodenum ampulla biopsy showed nondiagnostic.  Negative for  invasive carcinoma. Bilirubin trended down to 11 at the time of discharge on 10/31/2018.  #PET scan images were independent reviewed by me and discussed with patient. cTxN2 M1a Patient has at least M1 a disease due to retroperitoneal/peri-aortic lymph node involvement,  sclerotic lesion of right proximal femur with mild hypermetabolic activity, as well as left sacrum  # EUS biopsy of pancreatic mass showed positive for malignancy, Adenocarcinoma. There is not enough tissue for NGS testing.  Attempt to at MMR, still not enough tissue to complete the testing.  INTERVAL HISTORY Melissa [name] is a 78 y.o. female who has above history reviewed by me today presents for follow up visit for management of stage IV pancreatic cancer Patient has been on palliative gemcitabine and Abraxane.  Today is cycle 3-day 8 treatment. Overall she tolerates well. Increased fatigue and tiredness.  Fatigue is worsened after chemotherapy. She has lost weight.  Appetite remains well.  She admits that she is not eating as much as previously. Patient reports feeling tired and fatigued.  Fatigue is worse after chemotherapy.  Otherwise doing well. Appetite has been fair.    Review of Systems  Constitutional: Positive for appetite change, fatigue and unexpected weight change. Negative for chills and fever.  HENT:   Negative for hearing loss and voice change.   Eyes: Negative for eye problems and icterus.  Respiratory: Negative for chest tightness and cough.   Cardiovascular: Negative for chest pain.  Gastrointestinal: Negative for abdominal distention, abdominal pain and blood in stool.  Endocrine: Negative for hot flashes.  Genitourinary: Negative for difficulty urinating and frequency.  Musculoskeletal: Negative for arthralgias.  Skin: Negative for itching and rash.  Neurological: Negative for extremity weakness.  Hematological: Negative for adenopathy.  Psychiatric/Behavioral: Negative for confusion.     MEDICAL HISTORY:  Past Medical History:  Diagnosis Date   Glaucoma    Hypertension    Osteopenia    Stroke [facility])     SURGICAL HISTORY: Past Surgical History:  Procedure Laterality Date   BREAST BIOPSY Right    core- stereo- neg   ERCP N/A 10/29/2018   Procedure: ENDOSCOPIC RETROGRADE CHOLANGIOPANCREATOGRAPHY (ERCP);  Surgeon: [name], MD;  Location: [facility] ENDOSCOPY;  Service: Endoscopy;  Laterality: N/A;   EUS N/A 11/18/2018   Procedure: FULL UPPER ENDOSCOPIC ULTRASOUND (EUS) RADIAL;  Surgeon: Holly Bodily, MD;  Location: [facility] ENDOSCOPY;  Service: Gastroenterology;  Laterality: N/A;   HIP ARTHROPLASTY Left 01/02/2016   Procedure: ARTHROPLASTY BIPOLAR HIP (HEMIARTHROPLASTY);  Surgeon: Dereck Leep, MD;  Location: [facility] ORS;  Service: Orthopedics;  Laterality: Left;   JOINT REPLACEMENT Left    THR   PORTACATH PLACEMENT Right 11/26/2018   Procedure: INSERTION PORT-A-CATH;  Surgeon: Jules Husbands, MD;  Location: [facility] ORS;  Service: General;  Laterality: Right;   TONSILLECTOMY      SOCIAL HISTORY: Social History   Socioeconomic History   Marital status: Married    Spouse name: Not on file   Number of children: Not on file   Years of education: Not on file   Highest education level: Not on file  Occupational History   Not on file  Social Needs   Financial resource strain: Not on file   Food insecurity    Worry: Not on file    Inability: Not on file   Transportation needs    Medical: Not on file    Non-medical: Not on file  Tobacco Use   Smoking status: Never Smoker   Smokeless tobacco: Never Used  Substance and Sexual Activity   Alcohol use: No   Drug use: Never   Sexual activity: Not on file  Lifestyle   Physical activity    Days per week: Not on file    Minutes per session: Not on file   Stress: Not on file  Relationships   Social connections    Talks on phone: Not on file    Gets together: Not on file    Attends  religious service: Not on file    Active member of club or organization: Not on file    Attends meetings of clubs or organizations: Not on file    Relationship status: Not on file   Intimate partner violence    Fear of current or ex partner: Not on file    Emotionally abused: Not on file    Physically abused: Not on file    Forced sexual activity: Not on file  Other Topics Concern   Not on file  Social History Narrative   Not on file    FAMILY HISTORY: Family History  Problem Relation Age of Onset   Kidney disease Father    Breast cancer Neg Hx     ALLERGIES:  has No Known Allergies.  MEDICATIONS:  Current Outpatient Medications  Medication Sig Dispense Refill   acetaminophen (TYLENOL) 500 MG tablet Take 500 mg by mouth every 6 (six) hours as needed.     amLODipine (NORVASC) 10 MG tablet Take 10 mg by mouth at bedtime.      docusate sodium (COLACE) 100 MG capsule Take 1 capsule (100 mg total) by  mouth daily as needed for mild constipation or moderate constipation. Do not take if you have loose stools 60 capsule 2   lidocaine-prilocaine (EMLA) cream Apply to affected area once 30 g 3   ondansetron (ZOFRAN) 8 MG tablet Take 1 tablet by mouth as needed.     oxyCODONE (ROXICODONE) 5 MG immediate release tablet Take 1 tablet (5 mg total) by mouth every 6 (six) hours as needed for moderate pain or severe pain. 60 tablet 0   potassium chloride (K-DUR) 10 MEQ tablet Take 1 tablet (10 mEq total) by mouth daily. (Patient taking differently: Take 10 mEq by mouth 2 (two) times daily. ) 30 tablet 0   prochlorperazine (COMPAZINE) 10 MG tablet Take 1 tablet by mouth as needed.     No current facility-administered medications for this visit.    Facility-Administered Medications Ordered in Other Visits  Medication Dose Route Frequency Provider Last Rate Last Dose   sodium chloride flush (NS) 0.9 % injection 10 mL  10 mL Intravenous PRN Earlie Server, MD   10 mL at 01/18/19 0846      PHYSICAL EXAMINATION: ECOG PERFORMANCE STATUS: 2 - Symptomatic, <50% confined to bed Vitals:   01/18/19 0908  BP: 126/79  Pulse: 77  Resp: 12  Temp: (!) 96 F (35.6 C)   Filed Weights   01/18/19 0908  Weight: 138 lb 4.8 oz (62.7 kg)    Physical Exam Constitutional:      General: She is not in acute distress.    Comments: Frail, sitting in wheelchair.  HENT:     Head: Normocephalic and atraumatic.  Eyes:     General: No scleral icterus.    Pupils: Pupils are equal, round, and reactive to light.  Neck:     Musculoskeletal: Normal range of motion and neck supple.  Cardiovascular:     Rate and Rhythm: Normal rate and regular rhythm.     Heart sounds: Normal heart sounds.  Pulmonary:     Effort: Pulmonary effort is normal. No respiratory distress.     Breath sounds: Normal breath sounds. No wheezing.  Abdominal:     General: Bowel sounds are normal. There is no distension.     Palpations: Abdomen is soft. There is no mass.     Tenderness: There is no abdominal tenderness.  Musculoskeletal: Normal range of motion.        General: No deformity.  Skin:    General: Skin is warm and dry.     Coloration: Skin is pale. Skin is not jaundiced.     Findings: No erythema or rash.  Neurological:     Mental Status: She is alert and oriented to person, place, and time.     Cranial Nerves: No cranial nerve deficit.     Coordination: Coordination normal.  Psychiatric:        Behavior: Behavior normal.        Thought Content: Thought content normal.     LABORATORY DATA:  I have reviewed the data as listed Lab Results  Component Value Date   WBC 5.7 01/18/2019   HGB 11.8 (L) 01/18/2019   HCT 35.6 (L) 01/18/2019   MCV 100.8 (H) 01/18/2019   PLT 263 01/18/2019   Recent Labs    10/29/18 1318  12/28/18 0842 01/11/19 0818 01/18/19 0846  NA 135   < > 138 135 138  K 3.0*   < > 3.6 4.0 3.8  CL 104   < > 103 103 104  CO2 21*   < >  '25 25 23  ' GLUCOSE 110*   < > 158* 123*  149*  BUN 13   < > '12 15 15  ' CREATININE 0.32*   < > 0.69 0.50 0.69  CALCIUM 9.0   < > 9.6 9.2 9.6  GFRNONAA >60   < > >60 >60 >60  GFRAA >60   < > >60 >60 >60  PROT 7.0   < > 8.1 7.5 7.7  ALBUMIN 2.6*   < > 3.9 3.7 3.7  AST 119*   < > 44* 21 38  ALT 79*   < > 41 17 26  ALKPHOS 399*   < > 219* 161* 156*  BILITOT 18.2*   < > 0.6 0.5 0.5  BILIDIR 10.2*  --   --   --   --    < > = values in this interval not displayed.   Iron/TIBC/Ferritin/ %Sat No results found for: IRON, TIBC, FERRITIN, IRONPCTSAT   RADIOGRAPHIC STUDIES: I have personally reviewed the radiological images as listed and agreed with the findings in the report. Mr Jeri Cos Wo Contrast  Result Date: 11/26/2018 CLINICAL DATA:  History of pancreas cancer with new onset seizure EXAM: MRI HEAD WITHOUT AND WITH CONTRAST TECHNIQUE: Multiplanar, multiecho pulse sequences of the brain and surrounding structures were obtained without and with intravenous contrast. CONTRAST:  87m GADAVIST GADOBUTROL 1 MMOL/ML IV SOLN COMPARISON:  08/06/2006 FINDINGS: Brain: No focal cortical finding to explain seizure. No mass or swelling to suggest metastatic disease. Remote lacunar infarct in the left thalamus. Cerebral atrophy with nonspecific pattern. Mild small vessel ischemic gliosis in the white matter. No hydrocephalus or collection. Vascular: Major flow voids and vascular enhancements are preserved Skull and upper cervical spine: Negative for marrow lesion. Cervical facet spurring. Sinuses/Orbits: Negative IMPRESSION: No evidence of metastatic disease and no cortical finding to explain seizure. Electronically Signed   By: JMonte FantasiaM.D.   On: 11/26/2018 11:34   Ct Abdomen Pelvis W Contrast  Result Date: 10/20/2018 CLINICAL DATA:  Jaundice EXAM: CT ABDOMEN AND PELVIS WITH CONTRAST TECHNIQUE: Multidetector CT imaging of the abdomen and pelvis was performed using the standard protocol following bolus administration of intravenous contrast.  CONTRAST:  1067mOMNIPAQUE IOHEXOL 300 MG/ML  SOLN COMPARISON:  None. FINDINGS: Lower chest: Lung bases demonstrate no acute consolidation or effusion. The heart size is within normal limits. Small hiatal hernia. Hepatobiliary: Subcentimeter hypodensity left hepatic lobe, too small to further characterize but probably a small cyst. Moderate intra hepatic biliary dilatation. Enlarged extrahepatic common bile duct, measuring up to 16 mm in diameter on coronal views. Distended gallbladder with numerous calcified stones. There may be slight gallbladder wall thickening or edema. Pancreas: Mild diffuse enlargement of the pancreatic head which appears somewhat indistinct. Mild hazy edema around the pancreatic head. Suspect indistinct mass at the head of pancreas. Mild pancreatic ductal dilatation. 11 mm indeterminate cystic lesion distal body/tail of pancreas, series 2, image number 27. Spleen: Normal in size without focal abnormality. Adrenals/Urinary Tract: Adrenal glands are unremarkable. Kidneys are normal, without renal calculi, focal lesion, or hydronephrosis. Bladder is unremarkable. Stomach/Bowel: Stomach is within normal limits. Appendix appears normal. No evidence of bowel wall thickening, distention, or inflammatory changes. Vascular/Lymphatic: Nonaneurysmal aorta. Mild aortic atherosclerosis. Multiple enlarged peripancreatic and porta hepatis lymph nodes measuring up to 2.2 cm, coronal series 5, image number 34. Enlarged retroperitoneal/Peri aortic lymph nodes, measuring up to 2.2 cm. Central low density within the periaortic nodes, possibly due to necrosis. Reproductive: Uterus and bilateral  adnexa are unremarkable. Other: Negative for free air or free fluid. Small fat in the umbilical region. Musculoskeletal: No acute or significant osseous findings. IMPRESSION: 1. Moderate to marked intra and extrahepatic biliary dilatation with mild diffuse dilatation of the pancreatic duct. Diffuse enlargement and  indistinct appearance of the pancreatic head, constellation of findings is suspect for biliary obstruction secondary to malignant stricture/possible pancreatic head mass. Additional finding of small cystic lesions within the distal body of the pancreas which could be more thoroughly evaluated with MRI 2. Multiple calcified gallstones. Gallbladder slightly enlarged and there may be slight gallbladder wall edema or thickening 3. Multiple enlarged porta hepatis, peripancreatic, and retroperitoneal nodes, raising concern for metastatic disease. Electronically Signed   By: Donavan Foil M.D.   On: 10/20/2018 18:41   Nm Pet Image Initial (pi) Skull Base To Thigh  Result Date: 11/09/2018 CLINICAL DATA:  Initial treatment strategy for pancreatic mass. EXAM: NUCLEAR MEDICINE PET SKULL BASE TO THIGH TECHNIQUE: 8.4 mCi F-18 FDG was injected intravenously. Full-ring PET imaging was performed from the skull base to thigh after the radiotracer. CT data was obtained and used for attenuation correction and anatomic localization. Fasting blood glucose: 131 mg/dl COMPARISON:  CT abdomen 10/20/2018 FINDINGS: Mediastinal blood pool activity: SUV max 2.3 Liver activity: SUV max 3.5 NECK: No significant abnormal hypermetabolic activity in this region. Incidental CT findings: Chronic right maxillary sinusitis. CHEST: No significant abnormal hypermetabolic activity in this region. Incidental CT findings: Atherosclerotic calcification of the aortic arch and branch vessels. ABDOMEN/PELVIS: Abnormal accentuated activity in the pancreatic head, maximum SUV 8.7, high suspicion for pancreatic head malignancy. The biliary stent passes through this region of abnormal accentuated metabolic activity. Hypermetabolic adjacent peripancreatic retroperitoneal lymph nodes. For example, a 1.3 cm peripancreatic lymph node a left periaortic lymph node measuring 1.4 cm in short axis on image 148/3 has a maximum SUV of 5.2. Other hypermetabolic porta  hepatis and retroperitoneal adenopathy is also present. No hypermetabolic hepatic mass is observed. Incidental CT findings: Moderately distended gallbladder containing numerous small gallstones. Trace amount of perihepatic fluid. Mild stranding around the pancreatic head and porta hepatis region and extending into the root of the mesentery. Mild hypodensity along the pancreatic tail on image 142/3 corresponding to a small fluid density lesion shown on prior CT scan in this vicinity. SKELETON: Mild accentuated activity along the left upper sacral ala and adjacent iliac bone. The sacral ala has a maximum SUV of 4.8 with the iliac bone having a maximum SUV of 4.7. Very faint sclerosis in the medullary space of the right proximal femur associated with accentuated metabolic activity, maximum SUV 4.0, this lesion measured 1.1 cm in diameter on image 246/3. Incidental CT findings: Left hip hemiarthroplasty. Lower lumbar degenerative facet arthropathy. Thoracic spondylosis. IMPRESSION: 1. Abnormal prominence of the pancreatic head with indistinctness of associated tissue planes and abnormal hypermetabolic activity with maximum SUV up to 8.7, compatible with malignancy. Pancreatic adenocarcinoma with certainly be a top differential diagnostic consideration. 2. Surrounding peripancreatic, porta hepatis, and retroperitoneal lymph nodes are hypermetabolic indicating malignant involvement. 3. Is a faintly sclerotic 1.1 cm lesion in the right proximal femur with mild hypermetabolic activity, maximum SUV 4.0. Uncertain significance, but the possibility of osseous metastatic disease is raised. There are also indistinctly marginated areas of accentuated metabolic activity in the left sacral ala and adjacent iliac bone which could be inflammatory from mild sacroiliitis or due to malignancy, but these lesions do not have CT correlate. 4. Distended and mildly thick-walled gallbladder with numerous  gallstones. A biliary stent is in  place. Strictly speaking I cannot exclude acute cholecystitis. 5. Other imaging findings of potential clinical significance: Chronic right maxillary sinusitis. Aortic Atherosclerosis (ICD10-I70.0). Electronically Signed   By: Van Clines M.D.   On: 11/09/2018 13:22   Dg Chest Port 1 View  Result Date: 11/26/2018 CLINICAL DATA:  Porta athlete's min EXAM: PORTABLE CHEST 1 VIEW COMPARISON:  Portable exam 1426 hours compared to 01/01/2016 FINDINGS: RIGHT jugular Port-A-Cath with tip projecting over SVC. Upper normal heart size. Mediastinal contours and pulmonary vascularity normal. Subsegmental atelectasis retrocardiac LEFT lower lobe. Minimal dependent atelectasis at RIGHT base. No infiltrate, pleural effusion or pneumothorax. Multilevel endplate spur formation thoracic spine. IMPRESSION: Bibasilar atelectasis. No pneumothorax following Port-A-Cath insertion. Electronically Signed   By: Lavonia Dana M.D.   On: 11/26/2018 14:51   Dg C-arm 1-60 Min-no Report  Result Date: 11/26/2018 Fluoroscopy was utilized by the requesting physician.  No radiographic interpretation.   Dg C-arm 1-60 Min-no Report  Result Date: 10/29/2018 Fluoroscopy was utilized by the requesting physician.  No radiographic interpretation.   US Abdomen Limited Ruq  Result Date: 10/29/2018 CLINICAL DATA:  Worsening jaundice. EXAM: ULTRASOUND ABDOMEN LIMITED RIGHT UPPER QUADRANT COMPARISON:  CT scan of October 20, 2018. FINDINGS: Gallbladder: Mild gallbladder distention is noted. Cholelithiasis is noted with mild gallbladder wall thickening and pericholecystic fluid. Some degree of sludge is present as well. No sonographic Murphy's sign is noted. Common bile duct: Diameter: Measures 17 mm proximally and 13 mm distally, consistent with distal common bile duct obstruction. Liver: No focal lesion identified. Within normal limits in parenchymal echogenicity. Intrahepatic biliary dilatation is noted. Portal vein is patent on color Doppler  imaging with normal direction of blood flow towards the liver. Other: None. IMPRESSION: Cholelithiasis is noted with mild gallbladder wall thickening and pericholecystic fluid, and some degree of sludge within gallbladder lumen. Mild gallbladder distention is noted. Cholecystitis cannot be excluded and clinical correlation is recommended. Significant intrahepatic and extrahepatic biliary dilatation is noted concerning for distal common bile duct obstruction, potentially due to pancreatic neoplasm as described on prior CT scan. Electronically Signed   By: Marijo Conception M.D.   On: 10/29/2018 12:55    [place] [facility] labs CA 19-9 was check on 10/21/2018, level was elevated at 1500.    ASSESSMENT & PLAN:  1. Pancreatic tumor   2. Normocytic anemia   3. Encounter for antineoplastic chemotherapy   4. Weight loss   . # Stage IV pancreatic cancer- Labs are reviewed and discussed with patient CA19.9 1500--> 2101-->1614--> today CA 19-9 897, discussed with patient. Counts are reviewed and discussed with patient. Acceptable to proceed with cycle 3-day 8 gemcitabine/Abraxane treatment. Plan to obtain MRI abdomen after she finishes 4 cycles of dose reduced gemcitabine and Abraxane.  #Grade 1 neuropathy.  Continue decreased dose of Abraxane  100 mg/m.  Neuropathy symptoms are stable. #Normocytic anemia, hemoglobin stable.  Continue to monitor. #Neoplasm related pain, no pain today  #Weight loss, patient has decreased weight.  We discussed about most small snacks throughout the day.  She follows up with dietitian. Follow-up with palliative service to establish care. #Discussed about genetic testing.  Patient has been referred to genetic testing.   All questions were answered. The patient knows to call the clinic with any problems questions or concerns.  cc Adin Hector, MD   Return of visit: 2 weeks We spent sufficient time to discuss many aspect of care, questions were answered to patient's  satisfaction.  Earlie Server, MD, PhD Hematology Oncology [place] at [facility] 01/18/2019

## 2019-01-18 NOTE — Progress Notes (Signed)
Patient has lost 4 lbs since last documented weight.

## 2019-01-21 ENCOUNTER — Other Ambulatory Visit: Payer: Self-pay

## 2019-01-21 DIAGNOSIS — Z4689 Encounter for fitting and adjustment of other specified devices: Secondary | ICD-10-CM

## 2019-01-25 ENCOUNTER — Other Ambulatory Visit
Admission: RE | Admit: 2019-01-25 | Discharge: 2019-01-25 | Disposition: A | Discharge status: Home / Self Care | Payer: Medicare Other | Source: Ambulatory Visit | Attending: Gastroenterology | Admitting: Gastroenterology

## 2019-01-25 DIAGNOSIS — Z20828 Contact with and (suspected) exposure to other viral communicable diseases: Secondary | ICD-10-CM | POA: Insufficient documentation

## 2019-01-25 DIAGNOSIS — Z01812 Encounter for preprocedural laboratory examination: Secondary | ICD-10-CM | POA: Diagnosis present

## 2019-01-25 LAB — SARS CORONAVIRUS 2 (TAT 6-24 HRS): SARS Coronavirus 2: NEGATIVE

## 2019-01-28 ENCOUNTER — Ambulatory Visit
Admission: RE | Admit: 2019-01-28 | Discharge: 2019-01-28 | Disposition: A | Discharge status: Home / Self Care | Payer: Medicare Other | Attending: Gastroenterology | Admitting: Gastroenterology

## 2019-01-28 ENCOUNTER — Ambulatory Visit: Payer: Medicare Other | Admitting: Anesthesiology

## 2019-01-28 ENCOUNTER — Ambulatory Visit: Payer: Medicare Other

## 2019-01-28 ENCOUNTER — Encounter
Admission: RE | Disposition: A | Discharge status: Home / Self Care | Payer: Self-pay | Source: Home / Self Care | Attending: Gastroenterology

## 2019-01-28 ENCOUNTER — Encounter: Payer: Self-pay | Admitting: *Deleted

## 2019-01-28 ENCOUNTER — Inpatient Hospital Stay (HOSPITAL_BASED_OUTPATIENT_CLINIC_OR_DEPARTMENT_OTHER): Payer: Medicare Other | Admitting: Hospice and Palliative Medicine

## 2019-01-28 DIAGNOSIS — Z96642 Presence of left artificial hip joint: Secondary | ICD-10-CM | POA: Insufficient documentation

## 2019-01-28 DIAGNOSIS — Z79899 Other long term (current) drug therapy: Secondary | ICD-10-CM | POA: Insufficient documentation

## 2019-01-28 DIAGNOSIS — K831 Obstruction of bile duct: Secondary | ICD-10-CM | POA: Insufficient documentation

## 2019-01-28 DIAGNOSIS — Z4589 Encounter for adjustment and management of other implanted devices: Secondary | ICD-10-CM | POA: Insufficient documentation

## 2019-01-28 DIAGNOSIS — Z8673 Personal history of transient ischemic attack (TIA), and cerebral infarction without residual deficits: Secondary | ICD-10-CM | POA: Insufficient documentation

## 2019-01-28 DIAGNOSIS — Z515 Encounter for palliative care: Secondary | ICD-10-CM

## 2019-01-28 DIAGNOSIS — E0590 Thyrotoxicosis, unspecified without thyrotoxic crisis or storm: Secondary | ICD-10-CM | POA: Diagnosis not present

## 2019-01-28 DIAGNOSIS — I10 Essential (primary) hypertension: Secondary | ICD-10-CM | POA: Diagnosis not present

## 2019-01-28 DIAGNOSIS — Z841 Family history of disorders of kidney and ureter: Secondary | ICD-10-CM | POA: Insufficient documentation

## 2019-01-28 DIAGNOSIS — M8580 Other specified disorders of bone density and structure, unspecified site: Secondary | ICD-10-CM | POA: Insufficient documentation

## 2019-01-28 DIAGNOSIS — C250 Malignant neoplasm of head of pancreas: Secondary | ICD-10-CM | POA: Insufficient documentation

## 2019-01-28 DIAGNOSIS — E785 Hyperlipidemia, unspecified: Secondary | ICD-10-CM | POA: Insufficient documentation

## 2019-01-28 DIAGNOSIS — Z4659 Encounter for fitting and adjustment of other gastrointestinal appliance and device: Secondary | ICD-10-CM

## 2019-01-28 DIAGNOSIS — H409 Unspecified glaucoma: Secondary | ICD-10-CM | POA: Insufficient documentation

## 2019-01-28 HISTORY — PX: ERCP: SHX5425

## 2019-01-28 SURGERY — ERCP, WITH INTERVENTION IF INDICATED
Anesthesia: General

## 2019-01-28 MED ORDER — GLYCOPYRROLATE 0.2 MG/ML IJ SOLN
INTRAMUSCULAR | Status: DC | PRN
  Administered 2019-01-28: 0.2 mg via INTRAVENOUS

## 2019-01-28 MED ORDER — LACTATED RINGERS IV SOLN
INTRAVENOUS | Status: DC
  Administered 2019-01-28: 11:00:00 via INTRAVENOUS

## 2019-01-28 MED ORDER — PROPOFOL 10 MG/ML IV BOLUS
INTRAVENOUS | Status: DC | PRN
  Administered 2019-01-28: 70 mg via INTRAVENOUS

## 2019-01-28 MED ORDER — PROPOFOL 500 MG/50ML IV EMUL
INTRAVENOUS | Status: DC | PRN
  Administered 2019-01-28: 140 ug/kg/min via INTRAVENOUS

## 2019-01-28 MED ORDER — FENTANYL CITRATE (PF) 100 MCG/2ML IJ SOLN
INTRAMUSCULAR | Status: AC
  Filled 2019-01-28: qty 2

## 2019-01-28 MED ORDER — LIDOCAINE HCL (CARDIAC) PF 100 MG/5ML IV SOSY
PREFILLED_SYRINGE | INTRAVENOUS | Status: DC | PRN
  Administered 2019-01-28: 80 mg via INTRAVENOUS

## 2019-01-28 MED ORDER — FENTANYL CITRATE (PF) 100 MCG/2ML IJ SOLN
INTRAMUSCULAR | Status: DC | PRN
  Administered 2019-01-28 (×2): 25 ug via INTRAVENOUS

## 2019-01-28 MED ORDER — PROPOFOL 10 MG/ML IV BOLUS
INTRAVENOUS | Status: AC
  Filled 2019-01-28: qty 20

## 2019-01-28 NOTE — Op Note (Signed)
[facility] Patient Name: Melissa [name] Procedure Date: 01/28/2019 9:17 AM MRN: [id number] Account #: [id number] Date of Birth: 1940/11/03 Admit Type: Inpatient Age: 78 Room: [facility] ENDO ROOM 4 Gender: Female Note Status: Finalized Procedure:             ERCP Indications:           Malignant tumor of the head of pancreas Providers:             [name] MD, MD Medicines:             Propofol per Anesthesia Complications:         No immediate complications. Procedure:             Pre-Anesthesia Assessment:                        - Prior to the procedure, a History and Physical was                         performed, and patient medications and allergies were                         reviewed. The patient's tolerance of previous                         anesthesia was also reviewed. The risks and benefits                         of the procedure and the sedation options and risks                         were discussed with the patient. All questions were                         answered, and informed consent was obtained. Prior                         Anticoagulants: The patient has taken no previous                         anticoagulant or antiplatelet agents. ASA Grade                         Assessment: III - A patient with severe systemic                         disease. After reviewing the risks and benefits, the                         patient was deemed in satisfactory condition to                         undergo the procedure.                        After obtaining informed consent, the scope was passed                         under direct vision. Throughout the procedure, the  patient's blood pressure, pulse, and oxygen                         saturations were monitored continuously. The                         Duodenoscope was introduced through the mouth, and                         used to inject contrast into and  used to inject                         contrast into the bile duct. The ERCP was accomplished                         without difficulty. The patient tolerated the                         procedure well. Findings:      A biliary stent was visible on the scout film. One plastic stent       originating in the biliary tree was emerging from the major papilla. One       stent was removed from the biliary tree using a snare. A wire was passed       into the biliary tree. The bile duct was deeply cannulated with the       short-nosed traction sphincterotome. Contrast was injected. I personally       interpreted the bile duct images. There was brisk flow of contrast       through the ducts. Image quality was excellent. Contrast extended to the       entire biliary tree. The lower third of the main bile duct contained a       single segmental stenosis. One 10 Fr by 6 cm covered metal stent was       placed 5 cm into the common bile duct. Bile flowed through the stent.       The stent was in good position. Impression:            - One stent from the biliary tree was seen in the                         major papilla.                        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 The stricture                         was malignant appearing.                        - One stent was removed from the biliary tree.                        - One covered metal stent was placed into the common  bile duct. Recommendation:        - Discharge patient to home.                        - Resume previous diet.                        - Watch for pancreatitis, bleeding, perforation, and                         cholangitis. Procedure Code(s):     --- Professional ---                        [phone], Endoscopic retrograde cholangiopancreatography                         (ERCP); with removal and exchange of stent(s), biliary                          or pancreatic duct, including pre- and post-dilation                         and guide wire passage, when performed, including                         sphincterotomy, when performed, each stent exchanged                        [phone], Endoscopic catheterization of the biliary                         ductal system, radiological supervision and                         interpretation Diagnosis Code(s):     --- Professional ---                        C25.0, Malignant neoplasm of head of pancreas                        Z46.59, Encounter for fitting and adjustment of other                         gastrointestinal appliance and device                        K83.1, Obstruction of bile duct CPT copyright 2019 [employer]. All rights reserved. The codes documented in this report are preliminary and upon coder review may  be revised to meet current compliance requirements. [name] MD, MD 01/28/2019 11:28:29 AM This report has been signed electronically. Number of Addenda: 0 Note Initiated On: 01/28/2019 9:17 AM Estimated Blood Loss:  Estimated blood loss: none.      [facility]

## 2019-01-28 NOTE — H&P (Signed)
Melissa Lame, MD [facility] [address] [place], [place] [address] Phone:[phone] Fax : [phone]  Primary Care Physician:  Adin Hector, MD Primary Gastroenterologist:  Dr. Allen Norris  Pre-Procedure History & Physical: HPI:  Melissa [name] is a 78 y.o. female is here for an ERCP.   Past Medical History:  Diagnosis Date  . Glaucoma   . Hypertension   . Osteopenia   . Stroke [facility])     Past Surgical History:  Procedure Laterality Date  . BREAST BIOPSY Right    core- stereo- neg  . ERCP N/A 10/29/2018   Procedure: ENDOSCOPIC RETROGRADE CHOLANGIOPANCREATOGRAPHY (ERCP);  Surgeon: Melissa Lame, MD;  Location: [facility] ENDOSCOPY;  Service: Endoscopy;  Laterality: N/A;  . EUS N/A 11/18/2018   Procedure: FULL UPPER ENDOSCOPIC ULTRASOUND (EUS) RADIAL;  Surgeon: Holly Bodily, MD;  Location: [facility] ENDOSCOPY;  Service: Gastroenterology;  Laterality: N/A;  . HIP ARTHROPLASTY Left 01/02/2016   Procedure: ARTHROPLASTY BIPOLAR HIP (HEMIARTHROPLASTY);  Surgeon: Dereck Leep, MD;  Location: [facility] ORS;  Service: Orthopedics;  Laterality: Left;  . JOINT REPLACEMENT Left    THR  . PORTACATH PLACEMENT Right 11/26/2018   Procedure: INSERTION PORT-A-CATH;  Surgeon: Jules Husbands, MD;  Location: [facility] ORS;  Service: General;  Laterality: Right;  . TONSILLECTOMY      Prior to Admission medications   Medication Sig Start Date End Date Taking? Authorizing Provider  amLODipine (NORVASC) 10 MG tablet Take 10 mg by mouth at bedtime.    Yes [provider]  potassium chloride (K-DUR) 10 MEQ tablet Take 1 tablet (10 mEq total) by mouth daily. Patient taking differently: Take 10 mEq by mouth 2 (two) times daily.  12/07/18  Yes Earlie Server, MD  acetaminophen (TYLENOL) 500 MG tablet Take 500 mg by mouth every 6 (six) hours as needed.    [provider]  docusate sodium (COLACE) 100 MG capsule Take 1 capsule (100 mg total) by mouth daily as needed for mild constipation or moderate  constipation. Do not take if you have loose stools 11/30/18   Earlie Server, MD  lidocaine-prilocaine (EMLA) cream Apply to affected area once 11/23/18   Earlie Server, MD  ondansetron (ZOFRAN) 8 MG tablet Take 1 tablet by mouth as needed. 11/24/18   [provider]  oxyCODONE (ROXICODONE) 5 MG immediate release tablet Take 1 tablet (5 mg total) by mouth every 6 (six) hours as needed for moderate pain or severe pain. 11/30/18   Earlie Server, MD  prochlorperazine (COMPAZINE) 10 MG tablet Take 1 tablet by mouth as needed. 11/24/18   [provider]    Allergies as of 01/24/2019  . (No Known Allergies)    Family History  Problem Relation Age of Onset  . Kidney disease Father   . Breast cancer Neg Hx     Social History   Socioeconomic History  . Marital status: Married    Spouse name: Not on file  . Number of children: Not on file  . Years of education: Not on file  . Highest education level: Not on file  Occupational History  . Not on file  Social Needs  . Financial resource strain: Not on file  . Food insecurity    Worry: Not on file    Inability: Not on file  . Transportation needs    Medical: Not on file    Non-medical: Not on file  Tobacco Use  . Smoking status: Never Smoker  . Smokeless tobacco: Never Used  Substance and Sexual Activity  . Alcohol use: No  . Drug use: Never  . Sexual activity: Not on file  Lifestyle  . Physical activity    Days per week: Not on file    Minutes per session: Not on file  . Stress: Not on file  Relationships  . Social Herbalist on phone: Not on file    Gets together: Not on file    Attends religious service: Not on file    Active member of club or organization: Not on file    Attends meetings of clubs or organizations: Not on file    Relationship status: Not on file  . Intimate partner violence    Fear of current or ex partner: Not on file    Emotionally abused: Not on file    Physically abused: Not on file    Forced  sexual activity: Not on file  Other Topics Concern  . Not on file  Social History Narrative  . Not on file    Review of Systems: See HPI, otherwise negative ROS  Physical Exam: BP 128/64   Pulse 78   Temp 97.7 F (36.5 C) (Temporal)   Resp 14   Ht 5' (1.524 m)   Wt 62.7 kg   SpO2 100%   BMI 27.01 kg/m  General:   Alert,  pleasant and cooperative in NAD Head:  Normocephalic and atraumatic. Neck:  Supple; no masses or thyromegaly. Lungs:  Clear throughout to auscultation.    Heart:  Regular rate and rhythm. Abdomen:  Soft, nontender and nondistended. Normal bowel sounds, without guarding, and without rebound.   Neurologic:  Alert and  oriented x4;  grossly normal neurologically.  Impression/Plan: Melissa [name] is here for an ERCP to be performed for pancreatic mass with stent removal.  Risks, benefits, limitations, and alternatives regarding  ERCP have been reviewed with the patient.  Questions have been answered.  All parties agreeable.   Melissa Lame, MD  01/28/2019, 11:07 AM

## 2019-01-28 NOTE — Anesthesia Preprocedure Evaluation (Signed)
Anesthesia Evaluation  Patient identified by MRN, date of birth, ID band Patient awake    Reviewed: Allergy & Precautions, H&P , NPO status , Patient's Chart, lab work & pertinent test results  History of Anesthesia Complications Negative for: history of anesthetic complications  Airway Mallampati: III  TM Distance: <3 FB Neck ROM: limited    Dental  (+) Chipped   Pulmonary neg pulmonary ROS, neg shortness of breath,           Cardiovascular Exercise Tolerance: Good hypertension, (-) angina(-) Past MI and (-) DOE      Neuro/Psych CVA (right hand and arm), Residual Symptoms negative psych ROS   GI/Hepatic negative GI ROS, Neg liver ROS, neg GERD  ,  Endo/Other  Hyperthyroidism   Renal/GU negative Renal ROS  negative genitourinary   Musculoskeletal   Abdominal   Peds  Hematology negative hematology ROS (+)   Anesthesia Other Findings Past Medical History: No date: Glaucoma No date: Hypertension No date: Osteopenia No date: Stroke [facility])  Past Surgical History: No date: BREAST BIOPSY; Right     Comment:  core- stereo- neg 10/29/2018: ERCP; N/A     Comment:  Procedure: ENDOSCOPIC RETROGRADE               CHOLANGIOPANCREATOGRAPHY (ERCP);  Surgeon: [name],               MD;  Location: [facility] ENDOSCOPY;  Service: Endoscopy;                Laterality: N/A; 01/02/2016: HIP ARTHROPLASTY; Left     Comment:  Procedure: ARTHROPLASTY BIPOLAR HIP (HEMIARTHROPLASTY);               Surgeon: Dereck Leep, MD;  Location: [facility] ORS;                Service: Orthopedics;  Laterality: Left; No date: TONSILLECTOMY  BMI    Body Mass Index: 27.96 kg/m      Reproductive/Obstetrics negative OB ROS                             Anesthesia Physical  Anesthesia Plan  ASA: III  Anesthesia Plan: General   Post-op Pain Management:    Induction: Intravenous  PONV Risk Score and Plan: Propofol  infusion and TIVA  Airway Management Planned: Natural Airway and Nasal Cannula  Additional Equipment:   Intra-op Plan:   Post-operative Plan:   Informed Consent: I have reviewed the patients History and Physical, chart, labs and discussed the procedure including the risks, benefits and alternatives for the proposed anesthesia with the patient or authorized representative who has indicated his/her understanding and acceptance.     Dental Advisory Given  Plan Discussed with: Anesthesiologist, CRNA and Surgeon  Anesthesia Plan Comments: (Patient consented for risks of anesthesia including but not limited to:  - adverse reactions to medications - risk of intubation if required - damage to teeth, lips or other oral mucosa - sore throat or hoarseness - Damage to heart, brain, lungs or loss of life  Patient voiced understanding.)        Anesthesia Quick Evaluation

## 2019-01-28 NOTE — Transfer of Care (Signed)
Immediate Anesthesia Transfer of Care Note  Patient: Melissa [name]  Procedure(s) Performed: ENDOSCOPIC RETROGRADE CHOLANGIOPANCREATOGRAPHY (ERCP) STENT REMOVAL (N/A )  Patient Location: PACU  Anesthesia Type:General  Level of Consciousness: sedated  Airway & Oxygen Therapy: Patient Spontanous Breathing  Post-op Assessment: Report given to RN and Post -op Vital signs reviewed and stable  Post vital signs: Reviewed and stable  Last Vitals:  Vitals Value Taken Time  BP 119/71 01/28/19 1113  Temp 36.4 C 01/28/19 1113  Pulse 90 01/28/19 1113  Resp 13 01/28/19 1113  SpO2 100 % 01/28/19 1113    Last Pain:  Vitals:   01/28/19 1002  TempSrc: Temporal  PainSc: 0-No pain         Complications: No apparent anesthesia complications

## 2019-01-28 NOTE — Progress Notes (Signed)
Patient was scheduled for a telephone visit today.  I was unable to reach her by phone.  I did speak with daughter, who says that patient is at the hospital getting a procedure.

## 2019-01-28 NOTE — Anesthesia Post-op Follow-up Note (Signed)
Anesthesia QCDR form completed.        

## 2019-01-29 NOTE — Anesthesia Postprocedure Evaluation (Signed)
Anesthesia Post Note  Patient: Melissa [name]  Procedure(s) Performed: ENDOSCOPIC RETROGRADE CHOLANGIOPANCREATOGRAPHY (ERCP) STENT REMOVAL (N/A )  Patient location during evaluation: PACU Anesthesia Type: General Level of consciousness: awake and alert Pain management: pain level controlled Vital Signs Assessment: post-procedure vital signs reviewed and stable Respiratory status: spontaneous breathing, nonlabored ventilation, respiratory function stable and patient connected to nasal cannula oxygen Cardiovascular status: blood pressure returned to baseline and stable Postop Assessment: no apparent nausea or vomiting Anesthetic complications: no     Last Vitals:  Vitals:   01/28/19 1120 01/28/19 1123  BP:  128/72  Pulse: 85 81  Resp: 17 20  Temp:    SpO2: 96% 99%    Last Pain:  Vitals:   01/28/19 1113  TempSrc: Tympanic  PainSc:                  [name]

## 2019-01-31 ENCOUNTER — Encounter: Payer: Self-pay | Admitting: Gastroenterology

## 2019-02-01 ENCOUNTER — Inpatient Hospital Stay: Payer: Medicare Other

## 2019-02-01 ENCOUNTER — Inpatient Hospital Stay (HOSPITAL_BASED_OUTPATIENT_CLINIC_OR_DEPARTMENT_OTHER): Payer: Medicare Other | Admitting: Oncology

## 2019-02-01 ENCOUNTER — Other Ambulatory Visit: Payer: Self-pay

## 2019-02-01 ENCOUNTER — Encounter: Payer: Self-pay | Admitting: Oncology

## 2019-02-01 VITALS — BP 114/67 | HR 77 | Temp 98.4°F | Resp 18 | Wt 139.1 lb

## 2019-02-01 DIAGNOSIS — D490 Neoplasm of unspecified behavior of digestive system: Secondary | ICD-10-CM

## 2019-02-01 DIAGNOSIS — C250 Malignant neoplasm of head of pancreas: Secondary | ICD-10-CM

## 2019-02-01 DIAGNOSIS — R634 Abnormal weight loss: Secondary | ICD-10-CM | POA: Diagnosis not present

## 2019-02-01 DIAGNOSIS — D649 Anemia, unspecified: Secondary | ICD-10-CM

## 2019-02-01 DIAGNOSIS — Z5111 Encounter for antineoplastic chemotherapy: Secondary | ICD-10-CM | POA: Diagnosis not present

## 2019-02-01 DIAGNOSIS — G629 Polyneuropathy, unspecified: Secondary | ICD-10-CM

## 2019-02-01 LAB — CBC WITH DIFFERENTIAL/PLATELET
Abs Immature Granulocytes: 0.04 10*3/uL (ref 0.00–0.07)
Basophils Absolute: 0 10*3/uL (ref 0.0–0.1)
Basophils Relative: 0 %
Eosinophils Absolute: 0.1 10*3/uL (ref 0.0–0.5)
Eosinophils Relative: 1 %
HCT: 37.7 % (ref 36.0–46.0)
Hemoglobin: 12.5 g/dL (ref 12.0–15.0)
Immature Granulocytes: 1 %
Lymphocytes Relative: 10 %
Lymphs Abs: 0.7 10*3/uL (ref 0.7–4.0)
MCH: 33.7 pg (ref 26.0–34.0)
MCHC: 33.2 g/dL (ref 30.0–36.0)
MCV: 101.6 fL — ABNORMAL HIGH (ref 80.0–100.0)
Monocytes Absolute: 1.2 10*3/uL — ABNORMAL HIGH (ref 0.1–1.0)
Monocytes Relative: 16 %
Neutro Abs: 5.4 10*3/uL (ref 1.7–7.7)
Neutrophils Relative %: 72 %
Platelets: 338 10*3/uL (ref 150–400)
RBC: 3.71 MIL/uL — ABNORMAL LOW (ref 3.87–5.11)
RDW: 16.4 % — ABNORMAL HIGH (ref 11.5–15.5)
WBC: 7.5 10*3/uL (ref 4.0–10.5)
nRBC: 0 % (ref 0.0–0.2)

## 2019-02-01 LAB — COMPREHENSIVE METABOLIC PANEL
ALT: 14 U/L (ref 0–44)
AST: 20 U/L (ref 15–41)
Albumin: 3.8 g/dL (ref 3.5–5.0)
Alkaline Phosphatase: 243 U/L — ABNORMAL HIGH (ref 38–126)
Anion gap: 9 (ref 5–15)
BUN: 13 mg/dL (ref 8–23)
CO2: 24 mmol/L (ref 22–32)
Calcium: 9.2 mg/dL (ref 8.9–10.3)
Chloride: 102 mmol/L (ref 98–111)
Creatinine, Ser: 0.53 mg/dL (ref 0.44–1.00)
GFR calc Af Amer: >60 mL/min (ref >60)
GFR calc non Af Amer: >60 mL/min (ref >60)
Glucose, Bld: 137 mg/dL — ABNORMAL HIGH (ref 70–99)
Potassium: 3.7 mmol/L (ref 3.5–5.1)
Sodium: 135 mmol/L (ref 135–145)
Total Bilirubin: 0.6 mg/dL (ref 0.3–1.2)
Total Protein: 7.8 g/dL (ref 6.5–8.1)

## 2019-02-01 MED ORDER — HEPARIN SOD (PORK) LOCK FLUSH 100 UNIT/ML IV SOLN
500.0000 [IU] | Freq: Once | INTRAVENOUS | Status: AC | PRN
  Administered 2019-02-01: 500 [IU]
  Filled 2019-02-01: qty 5

## 2019-02-01 MED ORDER — PROCHLORPERAZINE MALEATE 10 MG PO TABS
10.0000 mg | ORAL_TABLET | Freq: Once | ORAL | Status: AC
  Administered 2019-02-01: 10 mg via ORAL
  Filled 2019-02-01: qty 1

## 2019-02-01 MED ORDER — SODIUM CHLORIDE 0.9 % IV SOLN
1600.0000 mg | Freq: Once | INTRAVENOUS | Status: AC
  Administered 2019-02-01: 1600 mg via INTRAVENOUS
  Filled 2019-02-01: qty 26.3

## 2019-02-01 MED ORDER — SODIUM CHLORIDE 0.9 % IV SOLN
Freq: Once | INTRAVENOUS | Status: AC
  Administered 2019-02-01: 10:00:00 via INTRAVENOUS
  Filled 2019-02-01: qty 250

## 2019-02-01 MED ORDER — SODIUM CHLORIDE 0.9% FLUSH
10.0000 mL | Freq: Once | INTRAVENOUS | Status: AC
  Administered 2019-02-01: 10 mL via INTRAVENOUS
  Filled 2019-02-01: qty 10

## 2019-02-01 MED ORDER — PACLITAXEL PROTEIN-BOUND CHEMO INJECTION 100 MG
85.0000 mg/m2 | Freq: Once | INTRAVENOUS | Status: AC
  Administered 2019-02-01: 150 mg via INTRAVENOUS
  Filled 2019-02-01: qty 30

## 2019-02-01 MED ORDER — GABAPENTIN 100 MG PO CAPS: 100.0000 mg | ORAL_CAPSULE | Freq: Three times a day (TID) | ORAL | 0 refills | Status: DC

## 2019-02-01 NOTE — Progress Notes (Signed)
Hematology/Oncology follow up note [facility] Telephone:(336) [phone] Fax:(336) [phone]   Patient Care Team: Adin Hector, MD as PCP - General (Internal Medicine) Clent Jacks, RN as Oncology Nurse Navigator  REFERRING PROVIDER: Adin Hector, MD  CHIEF COMPLAINTS/REASON FOR VISIT:  Follow-up for pancreatic cancer   HISTORY OF PRESENTING ILLNESS:   Melissa [name] is a  78 y.o.  female with PMH listed below was seen in consultation at the request of  Adin Hector, MD  for evaluation of abnormal pancreatic disease. Patient was recently seen by primary care provider Dr. Caryl Comes for evaluation of nausea, diarrhea and jaundice.  Blood work on 10/19/2018 showed potassium 2.9, bilirubin 15, alkaline phosphatase 478, AST 114, ALT 95. Abdomen pelvis CT scan on 10/20/2018 showed moderate to market intra-and extra hepatic biliary dilation with mild diffuse dilatation of the pancreatic duct. Patient has had poor appetite and has lost a 5 to 10 pounds recently. She also had diarrhea.  She noticed sand like stool for 1- 2 weeks.  She takes care of her 52 year old mother and recently feels she is not able to take care of her anymore due to progressively worsening weakness and fatigue.  She placed her mother to assisted living yesterday. She has had discussion with Dr. Caryl Comes about her blood work and CT scans.  She understands that there is a strong suspicion of cancer.  Nonessential medication has been stopped. CA 19-9 was check on 10/21/2018, level was elevated at 1500.  Today she denies any pain.  Itchy all over her body.. Per patient's request, patient's daughter [name] was [name] and [name] was able to hear entire clinical encounter conversation and participate in the reported history and discussion.  # Patient was admitted from 10/29/2018-10/31/2018.  Status post ERCP and biliary stenting.  Patient had duodenum ampulla biopsy showed nondiagnostic.  Negative for  invasive carcinoma. Bilirubin trended down to 11 at the time of discharge on 10/31/2018.  #PET scan images were independent reviewed by me and discussed with patient. cTxN2 M1a Patient has at least M1 a disease due to retroperitoneal/peri-aortic lymph node involvement,  sclerotic lesion of right proximal femur with mild hypermetabolic activity, as well as left sacrum  # EUS biopsy of pancreatic mass showed positive for malignancy, Adenocarcinoma. There is not enough tissue for NGS testing.  Attempt to at MMR, still not enough tissue to complete the testing.  INTERVAL HISTORY Melissa [name] is a 78 y.o. female who has above history reviewed by me today presents for follow up visit for management of stage IV pancreatic cancer Patient has been on palliative chemotherapy with gemcitabine and Abraxane.  2 weeks only. Today is cycle 4-day 1 treatments. Overall she tolerates well. She has increased fatigue and tiredness.  Fatigue is worsened after chemotherapy. Appetite is fair.  Weight remains relatively stable for the past few weeks.  #Neuropathy, mostly fingertips tingling.  No exacerbating or alleviating factors. Denies nausea, vomiting, fever, chills, abdominal pain. Patient recently status post biliary stent removal and placement of metal stent.   Review of Systems  Constitutional: Positive for fatigue. Negative for appetite change, chills and fever.  HENT:   Negative for hearing loss and voice change.   Eyes: Negative for eye problems and icterus.  Respiratory: Negative for chest tightness and cough.   Cardiovascular: Negative for chest pain.  Gastrointestinal: Negative for abdominal distention, abdominal pain and blood in stool.  Endocrine: Negative for hot flashes.  Genitourinary: Negative for  difficulty urinating and frequency.   Musculoskeletal: Negative for arthralgias.  Skin: Negative for itching and rash.  Neurological: Positive for numbness. Negative for extremity  weakness.  Hematological: Negative for adenopathy.  Psychiatric/Behavioral: Negative for confusion.    MEDICAL HISTORY:  Past Medical History:  Diagnosis Date   Glaucoma    Hypertension    Osteopenia    Stroke [facility])     SURGICAL HISTORY: Past Surgical History:  Procedure Laterality Date   BREAST BIOPSY Right    core- stereo- neg   ERCP N/A 10/29/2018   Procedure: ENDOSCOPIC RETROGRADE CHOLANGIOPANCREATOGRAPHY (ERCP);  Surgeon: [name], MD;  Location: [facility] ENDOSCOPY;  Service: Endoscopy;  Laterality: N/A;   ERCP N/A 01/28/2019   Procedure: ENDOSCOPIC RETROGRADE CHOLANGIOPANCREATOGRAPHY (ERCP) STENT REMOVAL;  Surgeon: [name], MD;  Location: [facility] ENDOSCOPY;  Service: Endoscopy;  Laterality: N/A;   EUS N/A 11/18/2018   Procedure: FULL UPPER ENDOSCOPIC ULTRASOUND (EUS) RADIAL;  Surgeon: Holly Bodily, MD;  Location: [facility] ENDOSCOPY;  Service: Gastroenterology;  Laterality: N/A;   HIP ARTHROPLASTY Left 01/02/2016   Procedure: ARTHROPLASTY BIPOLAR HIP (HEMIARTHROPLASTY);  Surgeon: Dereck Leep, MD;  Location: [facility] ORS;  Service: Orthopedics;  Laterality: Left;   JOINT REPLACEMENT Left    THR   PORTACATH PLACEMENT Right 11/26/2018   Procedure: INSERTION PORT-A-CATH;  Surgeon: Jules Husbands, MD;  Location: [facility] ORS;  Service: General;  Laterality: Right;   TONSILLECTOMY      SOCIAL HISTORY: Social History   Socioeconomic History   Marital status: Married    Spouse name: Not on file   Number of children: Not on file   Years of education: Not on file   Highest education level: Not on file  Occupational History   Not on file  Social Needs   Financial resource strain: Not on file   Food insecurity    Worry: Not on file    Inability: Not on file   Transportation needs    Medical: Not on file    Non-medical: Not on file  Tobacco Use   Smoking status: Never Smoker   Smokeless tobacco: Never Used  Substance and Sexual Activity   Alcohol  use: No   Drug use: Never   Sexual activity: Not on file  Lifestyle   Physical activity    Days per week: Not on file    Minutes per session: Not on file   Stress: Not on file  Relationships   Social connections    Talks on phone: Not on file    Gets together: Not on file    Attends religious service: Not on file    Active member of club or organization: Not on file    Attends meetings of clubs or organizations: Not on file    Relationship status: Not on file   Intimate partner violence    Fear of current or ex partner: Not on file    Emotionally abused: Not on file    Physically abused: Not on file    Forced sexual activity: Not on file  Other Topics Concern   Not on file  Social History Narrative   Not on file    FAMILY HISTORY: Family History  Problem Relation Age of Onset   Kidney disease Father    Breast cancer Neg Hx     ALLERGIES:  has No Known Allergies.  MEDICATIONS:  Current Outpatient Medications  Medication Sig Dispense Refill   acetaminophen (TYLENOL) 500 MG tablet Take 500 mg by mouth every 6 (  six) hours as needed.     amLODipine (NORVASC) 10 MG tablet Take 10 mg by mouth at bedtime.      docusate sodium (COLACE) 100 MG capsule Take 1 capsule (100 mg total) by mouth daily as needed for mild constipation or moderate constipation. Do not take if you have loose stools 60 capsule 2   lidocaine-prilocaine (EMLA) cream Apply to affected area once 30 g 3   ondansetron (ZOFRAN) 8 MG tablet Take 1 tablet by mouth as needed.     oxyCODONE (ROXICODONE) 5 MG immediate release tablet Take 1 tablet (5 mg total) by mouth every 6 (six) hours as needed for moderate pain or severe pain. 60 tablet 0   potassium chloride (K-DUR) 10 MEQ tablet Take 1 tablet (10 mEq total) by mouth daily. (Patient taking differently: Take 10 mEq by mouth 2 (two) times daily. ) 30 tablet 0   prochlorperazine (COMPAZINE) 10 MG tablet Take 1 tablet by mouth as needed.      gabapentin (NEURONTIN) 100 MG capsule Take 1 capsule (100 mg total) by mouth 3 (three) times daily. 90 capsule 0   No current facility-administered medications for this visit.      PHYSICAL EXAMINATION: ECOG PERFORMANCE STATUS: 2 - Symptomatic, <50% confined to bed Vitals:   02/01/19 0855  BP: 114/67  Pulse: 77  Resp: 18  Temp: 98.4 F (36.9 C)   Filed Weights   02/01/19 0855  Weight: 139 lb 1.6 oz (63.1 kg)    Physical Exam Constitutional:      General: She is not in acute distress.    Comments: Frail, sitting in wheelchair.  HENT:     Head: Normocephalic and atraumatic.  Eyes:     General: No scleral icterus.    Pupils: Pupils are equal, round, and reactive to light.  Neck:     Musculoskeletal: Normal range of motion and neck supple.  Cardiovascular:     Rate and Rhythm: Normal rate and regular rhythm.     Heart sounds: Normal heart sounds.  Pulmonary:     Effort: Pulmonary effort is normal. No respiratory distress.     Breath sounds: Normal breath sounds. No wheezing.  Abdominal:     General: Bowel sounds are normal. There is no distension.     Palpations: Abdomen is soft. There is no mass.     Tenderness: There is no abdominal tenderness.  Musculoskeletal: Normal range of motion.        General: No deformity.  Skin:    General: Skin is warm and dry.     Coloration: Skin is pale. Skin is not jaundiced.     Findings: No erythema or rash.  Neurological:     Mental Status: She is alert and oriented to person, place, and time.     Cranial Nerves: No cranial nerve deficit.     Coordination: Coordination normal.  Psychiatric:        Behavior: Behavior normal.        Thought Content: Thought content normal.     LABORATORY DATA:  I have reviewed the data as listed Lab Results  Component Value Date   WBC 7.5 02/01/2019   HGB 12.5 02/01/2019   HCT 37.7 02/01/2019   MCV 101.6 (H) 02/01/2019   PLT 338 02/01/2019   Recent Labs    10/29/18 1318   01/11/19 0818 01/18/19 0846 02/01/19 0836  NA 135   < > 135 138 135  K 3.0*   < > 4.0 3.8  3.7  CL 104   < > 103 104 102  CO2 21*   < > '25 23 24  ' GLUCOSE 110*   < > 123* 149* 137*  BUN 13   < > '15 15 13  ' CREATININE 0.32*   < > 0.50 0.69 0.53  CALCIUM 9.0   < > 9.2 9.6 9.2  GFRNONAA >60   < > >60 >60 >60  GFRAA >60   < > >60 >60 >60  PROT 7.0   < > 7.5 7.7 7.8  ALBUMIN 2.6*   < > 3.7 3.7 3.8  AST 119*   < > 21 38 20  ALT 79*   < > '17 26 14  ' ALKPHOS 399*   < > 161* 156* 243*  BILITOT 18.2*   < > 0.5 0.5 0.6  BILIDIR 10.2*  --   --   --   --    < > = values in this interval not displayed.   Iron/TIBC/Ferritin/ %Sat No results found for: IRON, TIBC, FERRITIN, IRONPCTSAT   RADIOGRAPHIC STUDIES: I have personally reviewed the radiological images as listed and agreed with the findings in the report. Mr Jeri Cos Wo Contrast  Result Date: 11/26/2018 CLINICAL DATA:  History of pancreas cancer with new onset seizure EXAM: MRI HEAD WITHOUT AND WITH CONTRAST TECHNIQUE: Multiplanar, multiecho pulse sequences of the brain and surrounding structures were obtained without and with intravenous contrast. CONTRAST:  13m GADAVIST GADOBUTROL 1 MMOL/ML IV SOLN COMPARISON:  08/06/2006 FINDINGS: Brain: No focal cortical finding to explain seizure. No mass or swelling to suggest metastatic disease. Remote lacunar infarct in the left thalamus. Cerebral atrophy with nonspecific pattern. Mild small vessel ischemic gliosis in the white matter. No hydrocephalus or collection. Vascular: Major flow voids and vascular enhancements are preserved Skull and upper cervical spine: Negative for marrow lesion. Cervical facet spurring. Sinuses/Orbits: Negative IMPRESSION: No evidence of metastatic disease and no cortical finding to explain seizure. Electronically Signed   By: JMonte FantasiaM.D.   On: 11/26/2018 11:34   Nm Pet Image Initial (pi) Skull Base To Thigh  Result Date: 11/09/2018 CLINICAL DATA:  Initial treatment  strategy for pancreatic mass. EXAM: NUCLEAR MEDICINE PET SKULL BASE TO THIGH TECHNIQUE: 8.4 mCi F-18 FDG was injected intravenously. Full-ring PET imaging was performed from the skull base to thigh after the radiotracer. CT data was obtained and used for attenuation correction and anatomic localization. Fasting blood glucose: 131 mg/dl COMPARISON:  CT abdomen 10/20/2018 FINDINGS: Mediastinal blood pool activity: SUV max 2.3 Liver activity: SUV max 3.5 NECK: No significant abnormal hypermetabolic activity in this region. Incidental CT findings: Chronic right maxillary sinusitis. CHEST: No significant abnormal hypermetabolic activity in this region. Incidental CT findings: Atherosclerotic calcification of the aortic arch and branch vessels. ABDOMEN/PELVIS: Abnormal accentuated activity in the pancreatic head, maximum SUV 8.7, high suspicion for pancreatic head malignancy. The biliary stent passes through this region of abnormal accentuated metabolic activity. Hypermetabolic adjacent peripancreatic retroperitoneal lymph nodes. For example, a 1.3 cm peripancreatic lymph node a left periaortic lymph node measuring 1.4 cm in short axis on image 148/3 has a maximum SUV of 5.2. Other hypermetabolic porta hepatis and retroperitoneal adenopathy is also present. No hypermetabolic hepatic mass is observed. Incidental CT findings: Moderately distended gallbladder containing numerous small gallstones. Trace amount of perihepatic fluid. Mild stranding around the pancreatic head and porta hepatis region and extending into the root of the mesentery. Mild hypodensity along the pancreatic tail on image 142/3 corresponding to a  small fluid density lesion shown on prior CT scan in this vicinity. SKELETON: Mild accentuated activity along the left upper sacral ala and adjacent iliac bone. The sacral ala has a maximum SUV of 4.8 with the iliac bone having a maximum SUV of 4.7. Very faint sclerosis in the medullary space of the right  proximal femur associated with accentuated metabolic activity, maximum SUV 4.0, this lesion measured 1.1 cm in diameter on image 246/3. Incidental CT findings: Left hip hemiarthroplasty. Lower lumbar degenerative facet arthropathy. Thoracic spondylosis. IMPRESSION: 1. Abnormal prominence of the pancreatic head with indistinctness of associated tissue planes and abnormal hypermetabolic activity with maximum SUV up to 8.7, compatible with malignancy. Pancreatic adenocarcinoma with certainly be a top differential diagnostic consideration. 2. Surrounding peripancreatic, porta hepatis, and retroperitoneal lymph nodes are hypermetabolic indicating malignant involvement. 3. Is a faintly sclerotic 1.1 cm lesion in the right proximal femur with mild hypermetabolic activity, maximum SUV 4.0. Uncertain significance, but the possibility of osseous metastatic disease is raised. There are also indistinctly marginated areas of accentuated metabolic activity in the left sacral ala and adjacent iliac bone which could be inflammatory from mild sacroiliitis or due to malignancy, but these lesions do not have CT correlate. 4. Distended and mildly thick-walled gallbladder with numerous gallstones. A biliary stent is in place. Strictly speaking I cannot exclude acute cholecystitis. 5. Other imaging findings of potential clinical significance: Chronic right maxillary sinusitis. Aortic Atherosclerosis (ICD10-I70.0). Electronically Signed   By: Van Clines M.D.   On: 11/09/2018 13:22   Dg Chest Port 1 View  Result Date: 11/26/2018 CLINICAL DATA:  Porta athlete's min EXAM: PORTABLE CHEST 1 VIEW COMPARISON:  Portable exam 1426 hours compared to 01/01/2016 FINDINGS: RIGHT jugular Port-A-Cath with tip projecting over SVC. Upper normal heart size. Mediastinal contours and pulmonary vascularity normal. Subsegmental atelectasis retrocardiac LEFT lower lobe. Minimal dependent atelectasis at RIGHT base. No infiltrate, pleural effusion or  pneumothorax. Multilevel endplate spur formation thoracic spine. IMPRESSION: Bibasilar atelectasis. No pneumothorax following Port-A-Cath insertion. Electronically Signed   By: Lavonia Dana M.D.   On: 11/26/2018 14:51   Dg C-arm 1-60 Min-no Report  Result Date: 01/28/2019 Fluoroscopy was utilized by the requesting physician.  No radiographic interpretation.   Dg C-arm 1-60 Min-no Report  Result Date: 11/26/2018 Fluoroscopy was utilized by the requesting physician.  No radiographic interpretation.    [place] [facility] labs CA 19-9 was check on 10/21/2018, level was elevated at 1500.    ASSESSMENT & PLAN:  1. Malignant neoplasm of head of pancreas ([place])   2. Weight loss   3. Encounter for antineoplastic chemotherapy   4. Neuropathy   . # Stage IV pancreatic cancer- Labs are reviewed and discussed with patient CA19.9 1500--> 2101-->1614-->  897 Labs reviewed and discussed with patient. Acceptable to proceed with cycle 4 gemcitabine and Abraxane day 1 treatments. I will obtain CT abdomen pelvis after she finishes 4 cycles of dose reduced chemotherapy for evaluation of treatment response. She will follow-up in 1 week, see my colleague Dr. Rogue Bussing for day 8 treatment evaluation.   #Grade 2 neuropathy, will decrease Abraxane to 85 mg/m. Recommend patient to start gabapentin 100 mg 3 times daily.  She may start with 100 mg daily and slowly titrate up to 3 times daily.  Prescription sent to pharmacy.  Side effects were discussed with patient.  #Weight loss, weight has been relatively stable.  Gained 1 pound today. Continue follow-up with dietitian. Continue follow-up with palliative service.  #Discussed about genetic testing.  Patient has been referred to genetic testing.   All questions were answered. The patient knows to call the clinic with any problems questions or concerns.  cc Adin Hector, MD   Return of visit:  1 week to lab MD Gem/Abraxane Dr. Rogue Bussing for day 8  treatment,  CT abdomen pelvis in 2 weeks  Lab MD 3 weeks with me Gem/Abraxane.   We spent sufficient time to discuss many aspect of care, questions were answered to patient's satisfaction.  Earlie Server, MD, PhD Hematology Oncology [place] at [facility] 02/01/2019

## 2019-02-01 NOTE — Progress Notes (Signed)
Patient here for follow up. Patient had pancreatic stainless steel placed last Friday (01/28/2019).

## 2019-02-02 LAB — CANCER ANTIGEN 19-9: CA 19-9: 940 U/mL — ABNORMAL HIGH (ref 0–35)

## 2019-02-07 ENCOUNTER — Other Ambulatory Visit: Payer: Self-pay

## 2019-02-07 NOTE — Progress Notes (Signed)
Patient pre screened for office appointment, no questions or concerns today. Patient reminded of upcoming appointment time and date. 

## 2019-02-08 ENCOUNTER — Other Ambulatory Visit: Payer: Self-pay

## 2019-02-08 ENCOUNTER — Inpatient Hospital Stay: Payer: Medicare Other

## 2019-02-08 ENCOUNTER — Inpatient Hospital Stay (HOSPITAL_BASED_OUTPATIENT_CLINIC_OR_DEPARTMENT_OTHER): Payer: Medicare Other | Admitting: Internal Medicine

## 2019-02-08 DIAGNOSIS — C250 Malignant neoplasm of head of pancreas: Secondary | ICD-10-CM

## 2019-02-08 DIAGNOSIS — D649 Anemia, unspecified: Secondary | ICD-10-CM

## 2019-02-08 DIAGNOSIS — D490 Neoplasm of unspecified behavior of digestive system: Secondary | ICD-10-CM

## 2019-02-08 DIAGNOSIS — Z5111 Encounter for antineoplastic chemotherapy: Secondary | ICD-10-CM | POA: Diagnosis not present

## 2019-02-08 LAB — COMPREHENSIVE METABOLIC PANEL
ALT: 25 U/L (ref 0–44)
AST: 37 U/L (ref 15–41)
Albumin: 3.6 g/dL (ref 3.5–5.0)
Alkaline Phosphatase: 194 U/L — ABNORMAL HIGH (ref 38–126)
Anion gap: 9 (ref 5–15)
BUN: 14 mg/dL (ref 8–23)
CO2: 26 mmol/L (ref 22–32)
Calcium: 9.2 mg/dL (ref 8.9–10.3)
Chloride: 103 mmol/L (ref 98–111)
Creatinine, Ser: 0.69 mg/dL (ref 0.44–1.00)
GFR calc Af Amer: >60 mL/min (ref >60)
GFR calc non Af Amer: >60 mL/min (ref >60)
Glucose, Bld: 168 mg/dL — ABNORMAL HIGH (ref 70–99)
Potassium: 3.9 mmol/L (ref 3.5–5.1)
Sodium: 138 mmol/L (ref 135–145)
Total Bilirubin: 0.5 mg/dL (ref 0.3–1.2)
Total Protein: 7.6 g/dL (ref 6.5–8.1)

## 2019-02-08 LAB — CBC WITH DIFFERENTIAL/PLATELET
Abs Immature Granulocytes: 0.21 10*3/uL — ABNORMAL HIGH (ref 0.00–0.07)
Basophils Absolute: 0 10*3/uL (ref 0.0–0.1)
Basophils Relative: 1 %
Eosinophils Absolute: 0.1 10*3/uL (ref 0.0–0.5)
Eosinophils Relative: 1 %
HCT: 36.8 % (ref 36.0–46.0)
Hemoglobin: 11.8 g/dL — ABNORMAL LOW (ref 12.0–15.0)
Immature Granulocytes: 4 %
Lymphocytes Relative: 18 %
Lymphs Abs: 0.9 10*3/uL (ref 0.7–4.0)
MCH: 33.3 pg (ref 26.0–34.0)
MCHC: 32.1 g/dL (ref 30.0–36.0)
MCV: 104 fL — ABNORMAL HIGH (ref 80.0–100.0)
Monocytes Absolute: 0.5 10*3/uL (ref 0.1–1.0)
Monocytes Relative: 10 %
Neutro Abs: 3.3 10*3/uL (ref 1.7–7.7)
Neutrophils Relative %: 66 %
Platelets: 244 10*3/uL (ref 150–400)
RBC: 3.54 MIL/uL — ABNORMAL LOW (ref 3.87–5.11)
RDW: 15.5 % (ref 11.5–15.5)
WBC: 5 10*3/uL (ref 4.0–10.5)
nRBC: 0.4 % — ABNORMAL HIGH (ref 0.0–0.2)

## 2019-02-08 MED ORDER — PROCHLORPERAZINE MALEATE 10 MG PO TABS
10.0000 mg | ORAL_TABLET | Freq: Once | ORAL | Status: AC
  Administered 2019-02-08: 10 mg via ORAL
  Filled 2019-02-08: qty 1

## 2019-02-08 MED ORDER — SODIUM CHLORIDE 0.9 % IV SOLN
Freq: Once | INTRAVENOUS | Status: AC
  Administered 2019-02-08: 10:00:00 via INTRAVENOUS
  Filled 2019-02-08: qty 250

## 2019-02-08 MED ORDER — PACLITAXEL PROTEIN-BOUND CHEMO INJECTION 100 MG
85.0000 mg/m2 | Freq: Once | INTRAVENOUS | Status: AC
  Administered 2019-02-08: 150 mg via INTRAVENOUS
  Filled 2019-02-08: qty 30

## 2019-02-08 MED ORDER — SODIUM CHLORIDE 0.9 % IV SOLN
1600.0000 mg | Freq: Once | INTRAVENOUS | Status: AC
  Administered 2019-02-08: 1600 mg via INTRAVENOUS
  Filled 2019-02-08: qty 26.3

## 2019-02-08 MED ORDER — SODIUM CHLORIDE 0.9% FLUSH
10.0000 mL | Freq: Once | INTRAVENOUS | Status: AC
  Administered 2019-02-08: 10 mL via INTRAVENOUS
  Filled 2019-02-08: qty 10

## 2019-02-08 MED ORDER — HEPARIN SOD (PORK) LOCK FLUSH 100 UNIT/ML IV SOLN
500.0000 [IU] | Freq: Once | INTRAVENOUS | Status: AC
  Administered 2019-02-08: 500 [IU] via INTRAVENOUS
  Filled 2019-02-08: qty 5

## 2019-02-08 NOTE — Assessment & Plan Note (Addendum)
#   Pancreatic adenocarcinoma-on palliative chemotherapy with gemcitabine Abraxane.  Clinical improvement noted; elevated tumor marker.  Awaiting repeat imaging next week.  #Proceed with cycle number 3-day 15 today. Labs today reviewed;  acceptable for treatment today.   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   # genetic testing.  Patient has been referred to genetic testing.    # DISPOSITION: # chemo today # Ct as planned next week # in 2weeks- Dr.Yu; labs- cbc/cmp/ca-19-9- dr.B # follow up as planned- dr.b

## 2019-02-08 NOTE — Progress Notes (Signed)
[facility] NOTE  Patient Care Team: Adin Hector, MD as PCP - General (Internal Medicine) Clent Jacks, RN as Oncology Nurse Navigator  CHIEF COMPLAINTS/PURPOSE OF CONSULTATION: Pancreatic cancer  #  Oncology History Overview Note  Melissa [name] is a  78 y.o.  female with PMH listed below was seen in consultation at the request of  Adin Hector, MD  for evaluation of abnormal pancreatic disease. Patient was recently seen by primary care provider Dr. Caryl Comes for evaluation of nausea, diarrhea and jaundice.  Blood work on 10/19/2018 showed potassium 2.9, bilirubin 15, alkaline phosphatase 478, AST 114, ALT 95. Abdomen pelvis CT scan on 10/20/2018 showed moderate to market intra-and extra hepatic biliary dilation with mild diffuse dilatation of the pancreatic duct. Patient has had poor appetite and has lost a 5 to 10 pounds recently. She also had diarrhea.  She noticed sand like stool for 1- 2 weeks.  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  She placed her mother to assisted living yesterday. She has had discussion with Dr. Caryl Comes about her blood work and CT scans.  She understands that there is a strong suspicion of cancer.  Nonessential medication has been stopped. CA 19-9 was check on 10/21/2018, level was elevated at 1500.   # Patient was admitted from 10/29/2018-10/31/2018.  Status post ERCP and biliary stenting.  Patient had duodenum ampulla biopsy showed nondiagnostic.  Negative for invasive carcinoma. Bilirubin trended down to 11 at the time of discharge on 10/31/2018.   #PET scan images were independent reviewed by me and discussed with patient. cTxN2 M1a Patient has at least M1 a disease due to retroperitoneal/peri-aortic lymph node involvement,  sclerotic lesion of right proximal femur with mild hypermetabolic activity, as well as left  sacrum   # EUS biopsy of pancreatic mass showed positive for malignancy, Adenocarcinoma. There is not enough tissue for NGS testing.  Attempt to at MMR, still not enough tissue to complete the testing   Malignant neoplasm of head of pancreas ([facility])  11/02/2018 Initial Diagnosis   Malignant neoplasm of head of pancreas [facility])    Chemotherapy   The patient had PACLitaxel-protein bound (ABRAXANE) chemo infusion 200 mg, 115 mg/m2 = 225 mg, Intravenous,  Once, 1 of 5 cycles  Administration: 200 mg (11/30/2018)    gemcitabine (GEMZAR) 1,600 mg in sodium chloride 0.9 % 250 mL chemo infusion, 1,710 mg, Intravenous,  Once, 1 of 5 cycles  Administration: 1,600 mg (11/30/2018)  for chemotherapy treatment.        HISTORY OF PRESENTING ILLNESS:  Melissa [name] 78 y.o.  female patient with advanced/metastatic pancreatic adenocarcinoma currently on palliative chemotherapy with gemcitabine Abraxane is here for follow-up.  Needed patient underwent ERCP and xchange of the plastic stent; with a metal stent.  Patient had episode of rigors-currently resolved.  Patient has mild tingling and numbness next images.  Otherwise not getting any worse.  Improved with Neurontin.  No nausea vomiting.  Appetite is fair.  She is gaining weight.  No abdominal pain.   Review of Systems  Constitutional: Positive for malaise/fatigue. Negative for chills, diaphoresis and fever.  HENT: Negative for nosebleeds and sore throat.   Eyes: Negative for double vision.  Respiratory: Negative for cough, hemoptysis, sputum production, shortness of breath and wheezing.   Cardiovascular: Negative for chest pain, palpitations, orthopnea and leg  swelling.  Gastrointestinal: Negative for abdominal pain, blood in stool, constipation, diarrhea, heartburn, melena, nausea and vomiting.  Genitourinary: Negative for dysuria, frequency and urgency.  Musculoskeletal: Positive for back pain and joint pain.  Skin: Negative.  Negative  for itching and rash.  Neurological: Negative for dizziness, tingling, focal weakness, weakness and headaches.  Endo/Heme/Allergies: Does not bruise/bleed easily.  Psychiatric/Behavioral: Negative for depression. The patient is not nervous/anxious and does not have insomnia.      MEDICAL HISTORY:  Past Medical History:  Diagnosis Date  . Glaucoma   . Hypertension   . Osteopenia   . Stroke [facility])     SURGICAL HISTORY: Past Surgical History:  Procedure Laterality Date  . BREAST BIOPSY Right    core- stereo- neg  . ERCP N/A 10/29/2018   Procedure: ENDOSCOPIC RETROGRADE CHOLANGIOPANCREATOGRAPHY (ERCP);  Surgeon: [name], MD;  Location: [facility] ENDOSCOPY;  Service: Endoscopy;  Laterality: N/A;  . ERCP N/A 01/28/2019   Procedure: ENDOSCOPIC RETROGRADE CHOLANGIOPANCREATOGRAPHY (ERCP) STENT REMOVAL;  Surgeon: [name], MD;  Location: [facility] ENDOSCOPY;  Service: Endoscopy;  Laterality: N/A;  . EUS N/A 11/18/2018   Procedure: FULL UPPER ENDOSCOPIC ULTRASOUND (EUS) RADIAL;  Surgeon: Holly Bodily, MD;  Location: [facility] ENDOSCOPY;  Service: Gastroenterology;  Laterality: N/A;  . HIP ARTHROPLASTY Left 01/02/2016   Procedure: ARTHROPLASTY BIPOLAR HIP (HEMIARTHROPLASTY);  Surgeon: Dereck Leep, MD;  Location: [facility] ORS;  Service: Orthopedics;  Laterality: Left;  . JOINT REPLACEMENT Left    THR  . PORTACATH PLACEMENT Right 11/26/2018   Procedure: INSERTION PORT-A-CATH;  Surgeon: Jules Husbands, MD;  Location: [facility] ORS;  Service: General;  Laterality: Right;  . TONSILLECTOMY      SOCIAL HISTORY: Social History   Socioeconomic History  . Marital status: Married    Spouse name: Not on file  . Number of children: Not on file  . Years of education: Not on file  . Highest education level: Not on file  Occupational History  . Not on file  Social Needs  . Financial resource strain: Not on file  . Food insecurity    Worry: Not on file    Inability: Not on file  . Transportation needs     Medical: Not on file    Non-medical: Not on file  Tobacco Use  . Smoking status: Never Smoker  . Smokeless tobacco: Never Used  Substance and Sexual Activity  . Alcohol use: No  . Drug use: Never  . Sexual activity: Not on file  Lifestyle  . Physical activity    Days per week: Not on file    Minutes per session: Not on file  . Stress: Not on file  Relationships  . Social Herbalist on phone: Not on file    Gets together: Not on file    Attends religious service: Not on file    Active member of club or organization: Not on file    Attends meetings of clubs or organizations: Not on file    Relationship status: Not on file  . Intimate partner violence    Fear of current or ex partner: Not on file    Emotionally abused: Not on file    Physically abused: Not on file    Forced sexual activity: Not on file  Other Topics Concern  . Not on file  Social History Narrative  . Not on file    FAMILY HISTORY: Family History  Problem Relation Age of Onset  . Kidney disease Father   .  Breast cancer Neg Hx     ALLERGIES:  has No Known Allergies.  MEDICATIONS:  Current Outpatient Medications  Medication Sig Dispense Refill  . acetaminophen (TYLENOL) 500 MG tablet Take 500 mg by mouth every 6 (six) hours as needed.    Marland Kitchen amLODipine (NORVASC) 10 MG tablet Take 10 mg by mouth at bedtime.     . docusate sodium (COLACE) 100 MG capsule Take 1 capsule (100 mg total) by mouth daily as needed for mild constipation or moderate constipation. Do not take if you have loose stools 60 capsule 2  . gabapentin (NEURONTIN) 100 MG capsule Take 1 capsule (100 mg total) by mouth 3 (three) times daily. 90 capsule 0  . lidocaine-prilocaine (EMLA) cream Apply to affected area once 30 g 3  . ondansetron (ZOFRAN) 8 MG tablet Take 1 tablet by mouth as needed.    Marland Kitchen oxyCODONE (ROXICODONE) 5 MG immediate release tablet Take 1 tablet (5 mg total) by mouth every 6 (six) hours as needed for moderate pain  or severe pain. 60 tablet 0  . potassium chloride (K-DUR) 10 MEQ tablet Take 1 tablet (10 mEq total) by mouth daily. (Patient taking differently: Take 10 mEq by mouth 2 (two) times daily. ) 30 tablet 0  . prochlorperazine (COMPAZINE) 10 MG tablet Take 1 tablet by mouth as needed.     No current facility-administered medications for this visit.       Marland Kitchen  PHYSICAL EXAMINATION: ECOG PERFORMANCE STATUS: 1 - Symptomatic but completely ambulatory  Vitals:   02/08/19 0844  BP: 133/66  Pulse: 76  Temp: (!) 96.8 F (36 C)   Filed Weights   02/08/19 0844  Weight: 142 lb (64.4 kg)    Physical Exam  Constitutional: She is oriented to person, place, and time and well-developed, well-nourished, and in no distress.  Patient in wheelchair.  HENT:  Head: Normocephalic and atraumatic.  Mouth/Throat: Oropharynx is clear and moist. No oropharyngeal exudate.  Eyes: Pupils are equal, round, and reactive to light.  Neck: Normal range of motion. Neck supple.  Cardiovascular: Normal rate and regular rhythm.  Pulmonary/Chest: Effort normal and breath sounds normal. No respiratory distress. She has no wheezes.  Abdominal: Soft. Bowel sounds are normal. She exhibits no distension and no mass. There is no abdominal tenderness. There is no rebound and no guarding.  Musculoskeletal: Normal range of motion.        General: No tenderness or edema.  Neurological: She is alert and oriented to person, place, and time.  Skin: Skin is warm.  Psychiatric: Affect normal.     LABORATORY DATA:  I have reviewed the data as listed Lab Results  Component Value Date   WBC 5.0 02/08/2019   HGB 11.8 (L) 02/08/2019   HCT 36.8 02/08/2019   MCV 104.0 (H) 02/08/2019   PLT 244 02/08/2019   Recent Labs    10/29/18 1318  01/18/19 0846 02/01/19 0836 02/08/19 0817  NA 135   < > 138 135 138  K 3.0*   < > 3.8 3.7 3.9  CL 104   < > 104 102 103  CO2 21*   < > '23 24 26  ' GLUCOSE 110*   < > 149* 137* 168*  BUN 13    < > '15 13 14  ' CREATININE 0.32*   < > 0.69 0.53 0.69  CALCIUM 9.0   < > 9.6 9.2 9.2  GFRNONAA >60   < > >60 >60 >60  GFRAA >60   < > >  60 >60 >60  PROT 7.0   < > 7.7 7.8 7.6  ALBUMIN 2.6*   < > 3.7 3.8 3.6  AST 119*   < > 38 20 37  ALT 79*   < > '26 14 25  ' ALKPHOS 399*   < > 156* 243* 194*  BILITOT 18.2*   < > 0.5 0.6 0.5  BILIDIR 10.2*  --   --   --   --    < > = values in this interval not displayed.    RADIOGRAPHIC STUDIES: I have personally reviewed the radiological images as listed and agreed with the findings in the report. Dg C-arm 1-60 Min-no Report  Result Date: 01/28/2019 Fluoroscopy was utilized by the requesting physician.  No radiographic interpretation.    ASSESSMENT & PLAN:   Malignant neoplasm of head of pancreas [facility]) # Pancreatic adenocarcinoma-on palliative chemotherapy with gemcitabine Abraxane.  Clinical improvement noted; elevated tumor marker.  Awaiting repeat imaging next week.  #Proceed with cycle number 3-day 15 today. Labs today reviewed;  acceptable for treatment today.   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   # genetic testing.  Patient has been referred to genetic testing.    # DISPOSITION: # chemo today # Ct as planned next week # in 2weeks- Dr.Yu; labs- cbc/cmp/ca-19-9- dr.B # follow up as planned- dr.b  All questions were answered. The patient knows to call the clinic with any problems, questions or concerns.    Cammie Sickle, MD 02/08/2019 12:50 PM

## 2019-02-15 ENCOUNTER — Ambulatory Visit
Admission: RE | Admit: 2019-02-15 | Discharge: 2019-02-15 | Disposition: A | Discharge status: Home / Self Care | Payer: Medicare Other | Source: Ambulatory Visit | Attending: Oncology | Admitting: Oncology

## 2019-02-15 ENCOUNTER — Other Ambulatory Visit: Payer: Self-pay

## 2019-02-15 DIAGNOSIS — C250 Malignant neoplasm of head of pancreas: Secondary | ICD-10-CM | POA: Insufficient documentation

## 2019-02-15 MED ORDER — IOHEXOL 300 MG/ML  SOLN
85.0000 mL | Freq: Once | INTRAMUSCULAR | Status: AC | PRN
  Administered 2019-02-15: 85 mL via INTRAVENOUS

## 2019-02-17 ENCOUNTER — Other Ambulatory Visit: Payer: Medicare Other

## 2019-02-17 ENCOUNTER — Ambulatory Visit: Payer: Medicare Other | Admitting: Internal Medicine

## 2019-02-22 ENCOUNTER — Inpatient Hospital Stay: Payer: Medicare Other | Attending: Oncology

## 2019-02-22 ENCOUNTER — Inpatient Hospital Stay: Payer: Medicare Other

## 2019-02-22 ENCOUNTER — Inpatient Hospital Stay (HOSPITAL_BASED_OUTPATIENT_CLINIC_OR_DEPARTMENT_OTHER): Payer: Medicare Other | Admitting: Oncology

## 2019-02-22 ENCOUNTER — Other Ambulatory Visit: Payer: Self-pay

## 2019-02-22 ENCOUNTER — Encounter: Payer: Self-pay | Admitting: Oncology

## 2019-02-22 VITALS — BP 123/67 | HR 79 | Temp 96.2°F | Resp 16 | Wt 142.0 lb

## 2019-02-22 DIAGNOSIS — G629 Polyneuropathy, unspecified: Secondary | ICD-10-CM | POA: Diagnosis not present

## 2019-02-22 DIAGNOSIS — C7951 Secondary malignant neoplasm of bone: Secondary | ICD-10-CM | POA: Diagnosis present

## 2019-02-22 DIAGNOSIS — D649 Anemia, unspecified: Secondary | ICD-10-CM

## 2019-02-22 DIAGNOSIS — Z5111 Encounter for antineoplastic chemotherapy: Secondary | ICD-10-CM | POA: Diagnosis present

## 2019-02-22 DIAGNOSIS — C250 Malignant neoplasm of head of pancreas: Secondary | ICD-10-CM | POA: Insufficient documentation

## 2019-02-22 DIAGNOSIS — Z95828 Presence of other vascular implants and grafts: Secondary | ICD-10-CM

## 2019-02-22 DIAGNOSIS — D490 Neoplasm of unspecified behavior of digestive system: Secondary | ICD-10-CM

## 2019-02-22 LAB — COMPREHENSIVE METABOLIC PANEL
ALT: 28 U/L (ref 0–44)
AST: 32 U/L (ref 15–41)
Albumin: 3.9 g/dL (ref 3.5–5.0)
Alkaline Phosphatase: 413 U/L — ABNORMAL HIGH (ref 38–126)
Anion gap: 9 (ref 5–15)
BUN: 14 mg/dL (ref 8–23)
CO2: 25 mmol/L (ref 22–32)
Calcium: 9.4 mg/dL (ref 8.9–10.3)
Chloride: 104 mmol/L (ref 98–111)
Creatinine, Ser: 0.56 mg/dL (ref 0.44–1.00)
GFR calc Af Amer: >60 mL/min (ref >60)
GFR calc non Af Amer: >60 mL/min (ref >60)
Glucose, Bld: 154 mg/dL — ABNORMAL HIGH (ref 70–99)
Potassium: 3.8 mmol/L (ref 3.5–5.1)
Sodium: 138 mmol/L (ref 135–145)
Total Bilirubin: 0.5 mg/dL (ref 0.3–1.2)
Total Protein: 7.8 g/dL (ref 6.5–8.1)

## 2019-02-22 LAB — CBC WITH DIFFERENTIAL/PLATELET
Abs Immature Granulocytes: 0.05 10*3/uL (ref 0.00–0.07)
Basophils Absolute: 0.1 10*3/uL (ref 0.0–0.1)
Basophils Relative: 1 %
Eosinophils Absolute: 0.2 10*3/uL (ref 0.0–0.5)
Eosinophils Relative: 3 %
HCT: 37 % (ref 36.0–46.0)
Hemoglobin: 12.1 g/dL (ref 12.0–15.0)
Immature Granulocytes: 1 %
Lymphocytes Relative: 8 %
Lymphs Abs: 0.7 10*3/uL (ref 0.7–4.0)
MCH: 34.5 pg — ABNORMAL HIGH (ref 26.0–34.0)
MCHC: 32.7 g/dL (ref 30.0–36.0)
MCV: 105.4 fL — ABNORMAL HIGH (ref 80.0–100.0)
Monocytes Absolute: 1.1 10*3/uL — ABNORMAL HIGH (ref 0.1–1.0)
Monocytes Relative: 14 %
Neutro Abs: 5.8 10*3/uL (ref 1.7–7.7)
Neutrophils Relative %: 73 %
Platelets: 301 10*3/uL (ref 150–400)
RBC: 3.51 MIL/uL — ABNORMAL LOW (ref 3.87–5.11)
RDW: 16.5 % — ABNORMAL HIGH (ref 11.5–15.5)
WBC: 7.9 10*3/uL (ref 4.0–10.5)
nRBC: 0 % (ref 0.0–0.2)

## 2019-02-22 MED ORDER — PROCHLORPERAZINE MALEATE 10 MG PO TABS
10.0000 mg | ORAL_TABLET | Freq: Once | ORAL | Status: AC
  Administered 2019-02-22: 09:00:00 10 mg via ORAL
  Filled 2019-02-22: qty 1

## 2019-02-22 MED ORDER — PACLITAXEL PROTEIN-BOUND CHEMO INJECTION 100 MG
85.0000 mg/m2 | Freq: Once | INTRAVENOUS | Status: AC
  Administered 2019-02-22: 10:00:00 150 mg via INTRAVENOUS
  Filled 2019-02-22: qty 30

## 2019-02-22 MED ORDER — HEPARIN SOD (PORK) LOCK FLUSH 100 UNIT/ML IV SOLN
500.0000 [IU] | Freq: Once | INTRAVENOUS | Status: AC | PRN
  Administered 2019-02-22: 11:00:00 500 [IU]
  Filled 2019-02-22: qty 5

## 2019-02-22 MED ORDER — SODIUM CHLORIDE 0.9 % IV SOLN
1600.0000 mg | Freq: Once | INTRAVENOUS | Status: AC
  Administered 2019-02-22: 11:00:00 1600 mg via INTRAVENOUS
  Filled 2019-02-22: qty 26.3

## 2019-02-22 MED ORDER — SODIUM CHLORIDE 0.9% FLUSH
10.0000 mL | Freq: Once | INTRAVENOUS | Status: AC
  Administered 2019-02-22: 08:00:00 10 mL via INTRAVENOUS
  Filled 2019-02-22: qty 10

## 2019-02-22 MED ORDER — SODIUM CHLORIDE 0.9 % IV SOLN
Freq: Once | INTRAVENOUS | Status: AC
  Administered 2019-02-22: 09:00:00 via INTRAVENOUS
  Filled 2019-02-22: qty 250

## 2019-02-22 NOTE — Progress Notes (Signed)
Hematology/Oncology follow up note [facility] [phone] [phone]   Patient Care Team: Melissa Hector, MD as PCP - General (Internal Medicine) Clent Jacks, RN as Oncology Nurse Navigator  REFERRING PROVIDER: Adin Hector, MD  CHIEF COMPLAINTS/REASON FOR VISIT:  Follow-up for pancreatic cancer   HISTORY OF PRESENTING ILLNESS:   Melissa [name] is a  78 y.o.  female with PMH listed below was seen in consultation at the request of  Melissa Hector, MD  for evaluation of abnormal pancreatic disease. Patient was recently seen by primary care provider Dr. Caryl [name] for evaluation of nausea, diarrhea and jaundice.  Blood work on 10/19/2018 showed potassium 2.9, bilirubin 15, alkaline phosphatase 478, AST 114, ALT 95. Abdomen pelvis CT scan on 10/20/2018 showed moderate to market intra-and extra hepatic biliary dilation with mild diffuse dilatation of the pancreatic duct. Patient has had poor appetite and has lost a 5 to 10 pounds recently. She also had diarrhea.  She noticed sand like stool for 1- 2 weeks.  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  She placed her mother to assisted living yesterday. She has had discussion with Dr. Caryl [name] about her blood work and CT scans.  She understands that there is a strong suspicion of cancer.  Nonessential medication has been stopped. CA 19-9 was check on 10/21/2018, level was elevated at 1500.  Today she denies any pain.  Itchy all over her body.. Per patient's request, patient's daughter Melissa [name] was Melissa [name] and Melissa [name] was able to hear entire clinical encounter conversation and participate in the reported history and discussion.  # Patient was admitted from 10/29/2018-10/31/2018.  Status post ERCP and biliary stenting.  Patient had duodenum ampulla biopsy showed nondiagnostic.  Negative for  invasive carcinoma. Bilirubin trended down to 11 at the time of discharge on 10/31/2018.  #PET scan images were independent reviewed by me and discussed with patient. cTxN2 M1a Patient has at least M1 a disease due to retroperitoneal/peri-aortic lymph node involvement,  sclerotic lesion of right proximal femur with mild hypermetabolic activity, as well as left sacrum  # EUS biopsy of pancreatic mass showed positive for malignancy, Adenocarcinoma. There is not enough tissue for NGS testing.  Attempt to at MMR, still not enough tissue to complete the testing.  INTERVAL HISTORY Melissa [name] is a 78 y.o. female who has above history reviewed by me today presents for follow up visit for management of stage IV pancreatic cancer Patient has been on palliative chemotherapy with gemcitabine and Abraxane.   Overall she tolerates well.  Grade 2 neuropathy, on gabapentin. Stable. She feels gabapentin helps her symptoms.  Denies any nausea, vomiting, fever, chills, abdominal pain.  Mild lower extremity edema improves in the morning, get worse at night.     Review of Systems  Constitutional: Positive for fatigue. Negative for appetite change, chills and fever.  HENT:   Negative for hearing loss and voice change.   Eyes: Negative for eye problems and icterus.  Respiratory: Negative for chest tightness and cough.   Cardiovascular: Negative for chest pain.  Gastrointestinal: Negative for abdominal distention, abdominal pain and blood in stool.  Endocrine: Negative for hot flashes.  Genitourinary: Negative for difficulty urinating and frequency.   Musculoskeletal: Negative for arthralgias.  Skin: Negative for itching and rash.  Neurological: Positive for numbness. Negative for extremity weakness.  Hematological: Negative for adenopathy.  Psychiatric/Behavioral: Negative for confusion.    MEDICAL HISTORY:  Past Medical History:  Diagnosis Date   Glaucoma    Hypertension    Osteopenia      Stroke [facility])     SURGICAL HISTORY: Past Surgical History:  Procedure Laterality Date   BREAST BIOPSY Right    core- stereo- neg   ERCP N/A 10/29/2018   Procedure: ENDOSCOPIC RETROGRADE CHOLANGIOPANCREATOGRAPHY (ERCP);  Surgeon: [name], MD;  Location: [facility] ENDOSCOPY;  Service: Endoscopy;  Laterality: N/A;   ERCP N/A 01/28/2019   Procedure: ENDOSCOPIC RETROGRADE CHOLANGIOPANCREATOGRAPHY (ERCP) STENT REMOVAL;  Surgeon: [name], MD;  Location: [facility] ENDOSCOPY;  Service: Endoscopy;  Laterality: N/A;   EUS N/A 11/18/2018   Procedure: FULL UPPER ENDOSCOPIC ULTRASOUND (EUS) RADIAL;  Surgeon: Holly Bodily, MD;  Location: [facility] ENDOSCOPY;  Service: Gastroenterology;  Laterality: N/A;   HIP ARTHROPLASTY Left 01/02/2016   Procedure: ARTHROPLASTY BIPOLAR HIP (HEMIARTHROPLASTY);  Surgeon: Dereck Leep, MD;  Location: [facility] ORS;  Service: Orthopedics;  Laterality: Left;   JOINT REPLACEMENT Left    THR   PORTACATH PLACEMENT Right 11/26/2018   Procedure: INSERTION PORT-A-CATH;  Surgeon: Jules Husbands, MD;  Location: [facility] ORS;  Service: General;  Laterality: Right;   TONSILLECTOMY      SOCIAL HISTORY: Social History   Socioeconomic History   Marital status: Married    Spouse name: Not on file   Number of children: Not on file   Years of education: Not on file   Highest education level: Not on file  Occupational History   Not on file  Social Needs   Financial resource strain: Not on file   Food insecurity    Worry: Not on file    Inability: Not on file   Transportation needs    Medical: Not on file    Non-medical: Not on file  Tobacco Use   Smoking status: Never Smoker   Smokeless tobacco: Never Used  Substance and Sexual Activity   Alcohol use: No   Drug use: Never   Sexual activity: Not on file  Lifestyle   Physical activity    Days per week: Not on file    Minutes per session: Not on file   Stress: Not on file  Relationships   Social  connections    Talks on phone: Not on file    Gets together: Not on file    Attends religious service: Not on file    Active member of club or organization: Not on file    Attends meetings of clubs or organizations: Not on file    Relationship status: Not on file   Intimate partner violence    Fear of current or ex partner: Not on file    Emotionally abused: Not on file    Physically abused: Not on file    Forced sexual activity: Not on file  Other Topics Concern   Not on file  Social History Narrative   Not on file    FAMILY HISTORY: Family History  Problem Relation Age of Onset   Kidney disease Father    Breast cancer Neg Hx     ALLERGIES:  has No Known Allergies.  MEDICATIONS:  Current Outpatient Medications  Medication Sig Dispense Refill   acetaminophen (TYLENOL) 500 MG tablet Take 500 mg by mouth every 6 (six) hours as needed.     amLODipine (NORVASC) 10 MG tablet Take 10 mg by mouth at bedtime.      docusate  sodium (COLACE) 100 MG capsule Take 1 capsule (100 mg total) by mouth daily as needed for mild constipation or moderate constipation. Do not take if you have loose stools 60 capsule 2   gabapentin (NEURONTIN) 100 MG capsule Take 1 capsule (100 mg total) by mouth 3 (three) times daily. 90 capsule 0   lidocaine-prilocaine (EMLA) cream Apply to affected area once 30 g 3   ondansetron (ZOFRAN) 8 MG tablet Take 1 tablet by mouth as needed.     oxyCODONE (ROXICODONE) 5 MG immediate release tablet Take 1 tablet (5 mg total) by mouth every 6 (six) hours as needed for moderate pain or severe pain. 60 tablet 0   potassium chloride (K-DUR) 10 MEQ tablet Take 1 tablet (10 mEq total) by mouth daily. (Patient taking differently: Take 10 mEq by mouth 2 (two) times daily. ) 30 tablet 0   prochlorperazine (COMPAZINE) 10 MG tablet Take 1 tablet by mouth as needed.     No current facility-administered medications for this visit.      PHYSICAL EXAMINATION: ECOG  PERFORMANCE STATUS: 2 - Symptomatic, <50% confined to bed Vitals:   02/22/19 0848  BP: 123/67  Pulse: 79  Resp: 16  Temp: (!) 96.2 F (35.7 C)  SpO2: 99%   Filed Weights   02/22/19 0848  Weight: 142 lb (64.4 kg)    Physical Exam Constitutional:      General: She is not in acute distress.    Comments: Frail, sits in wheelchair.  HENT:     Head: Normocephalic and atraumatic.  Eyes:     General: No scleral icterus.    Pupils: Pupils are equal, round, and reactive to light.  Neck:     Musculoskeletal: Normal range of motion and neck supple.  Cardiovascular:     Rate and Rhythm: Normal rate and regular rhythm.     Heart sounds: Normal heart sounds.  Pulmonary:     Effort: Pulmonary effort is normal. No respiratory distress.     Breath sounds: Normal breath sounds. No wheezing.  Abdominal:     General: Bowel sounds are normal. There is no distension.     Palpations: Abdomen is soft. There is no mass.     Tenderness: There is no abdominal tenderness.  Musculoskeletal: Normal range of motion.        General: No deformity.  Skin:    General: Skin is warm and dry.     Coloration: Skin is pale. Skin is not jaundiced.     Findings: No erythema or rash.  Neurological:     Mental Status: She is alert and oriented to person, place, and time.     Cranial Nerves: No cranial nerve deficit.     Coordination: Coordination normal.  Psychiatric:        Behavior: Behavior normal.        Thought Content: Thought content normal.     LABORATORY DATA:  I have reviewed the data as listed Lab Results  Component Value Date   WBC 5.0 02/08/2019   HGB 11.8 (L) 02/08/2019   HCT 36.8 02/08/2019   MCV 104.0 (H) 02/08/2019   PLT 244 02/08/2019   Recent Labs    10/29/18 1318  01/18/19 0846 02/01/19 0836 02/08/19 0817  NA 135   < > 138 135 138  K 3.0*   < > 3.8 3.7 3.9  CL 104   < > 104 102 103  CO2 21*   < > '23 24 26  ' GLUCOSE 110*   < >  149* 137* 168*  BUN 13   < > '15 13 14    ' CREATININE 0.32*   < > 0.69 0.53 0.69  CALCIUM 9.0   < > 9.6 9.2 9.2  GFRNONAA >60   < > >60 >60 >60  GFRAA >60   < > >60 >60 >60  PROT 7.0   < > 7.7 7.8 7.6  ALBUMIN 2.6*   < > 3.7 3.8 3.6  AST 119*   < > 38 20 37  ALT 79*   < > '26 14 25  ' ALKPHOS 399*   < > 156* 243* 194*  BILITOT 18.2*   < > 0.5 0.6 0.5  BILIDIR 10.2*  --   --   --   --    < > = values in this interval not displayed.   Iron/TIBC/Ferritin/ %Sat No results found for: IRON, TIBC, FERRITIN, IRONPCTSAT   RADIOGRAPHIC STUDIES: I have personally reviewed the radiological images as listed and agreed with the findings in the report. Mr Jeri Cos Wo Contrast  Result Date: 11/26/2018 CLINICAL DATA:  History of pancreas cancer with new onset seizure EXAM: MRI HEAD WITHOUT AND WITH CONTRAST TECHNIQUE: Multiplanar, multiecho pulse sequences of the brain and surrounding structures were obtained without and with intravenous contrast. CONTRAST:  46m GADAVIST GADOBUTROL 1 MMOL/ML IV SOLN COMPARISON:  08/06/2006 FINDINGS: Brain: No focal cortical finding to explain seizure. No mass or swelling to suggest metastatic disease. Remote lacunar infarct in the left thalamus. Cerebral atrophy with nonspecific pattern. Mild small vessel ischemic gliosis in the white matter. No hydrocephalus or collection. Vascular: Major flow voids and vascular enhancements are preserved Skull and upper cervical spine: Negative for marrow lesion. Cervical facet spurring. Sinuses/Orbits: Negative IMPRESSION: No evidence of metastatic disease and no cortical finding to explain seizure. Electronically Signed   By: JMonte FantasiaM.D.   On: 11/26/2018 11:34   Ct Abdomen Pelvis W Contrast  Result Date: 02/15/2019 CLINICAL DATA:  Pancreatic adenocarcinoma. EXAM: CT ABDOMEN AND PELVIS WITH CONTRAST TECHNIQUE: Multidetector CT imaging of the abdomen and pelvis was performed using the standard protocol following bolus administration of intravenous contrast. CONTRAST:  861m OMNIPAQUE IOHEXOL 300 MG/ML  SOLN COMPARISON:  10/20/2018 FINDINGS: Lower chest: Unremarkable. Hepatobiliary: 8 mm hypodensity in the inferior right liver adjacent to the gallbladder (25/2) is new in the interval. 9 mm hypodensity in the tip of the left liver is stable and likely a cyst. Another 5 mm hypodensity in the dome of the liver is stable. Multiple gallstones evident with gas visible in the gallbladder lumen. Intrahepatic biliary duct dilatation seen previously has decreased with common bile duct stent visualized in situ. Pancreas: Pancreatic parenchymal atrophy noted with dilatation of the main duct in the body and tail of pancreas. No discrete pancreatic mass lesion evident although an ill-defined area of hypoenhancement in the head of the pancreas measures approximately 3 x 2.6 cm today. Spleen: No splenomegaly. No focal mass lesion. Adrenals/Urinary Tract: No adrenal nodule or mass. Kidneys unremarkable. No evidence for hydroureter. The urinary bladder appears normal for the degree of distention. Stomach/Bowel: Tiny hiatal hernia. Stomach otherwise unremarkable. Duodenum is normally positioned as is the ligament of Treitz. No small bowel wall thickening. No small bowel dilatation. The terminal ileum is normal. The appendix is normal. No gross colonic mass. No colonic wall thickening. Diverticular changes are noted in the left colon without evidence of diverticulitis. Vascular/Lymphatic: There is abdominal aortic atherosclerosis without aneurysm. Portal vein, superior mesenteric vein, and splenic  vein are patent. Celiac axis and SMA are patent. Mild lymphadenopathy in the gastrohepatic and hepato duodenal ligaments is not substantially changed. Index gastrohepatic ligament lymph node measures 9 mm short axis on 14/2. 15 mm retrocaval node on 21/2 was 18 mm short axis previously. Left para-aortic node measuring 1.4 cm short axis on 23/2 was 1.4 cm previously. No pelvic sidewall lymphadenopathy.  Reproductive: The uterus is unremarkable.  There is no adnexal mass. Other: Trace free fluid noted in the cul-de-sac. Musculoskeletal: Status post left total hip replacement. Interval development of multiple lytic and sclerotic bone lesions consistent with metastatic involvement. 2.8 cm lesion identified left iliac bone on 47/2. New 13 mm lytic and sclerotic lesion in the left L2 vertebral body noted on 24/2. 14 mm sclerotic lesion in the sacrum visible on 51/2. IMPRESSION: 1. Interval development of multiple lytic and sclerotic bone lesions consistent with bony metastatic involvement. 2. Decrease in intrahepatic biliary duct dilatation with common bile duct stent visualized in situ. 3. Ill-defined hypoenhancing pancreatic head mass measuring 3.0 x 2.6 cm today. 4. New 8 mm hypodensity in the inferior right liver adjacent to the gallbladder. Attention on follow-up recommended. 5. Upper abdominal lymphadenopathy shows no substantial change. 6.  Aortic Atherosclerois (ICD10-170.0) Electronically Signed   By: Misty Stanley M.D.   On: 02/15/2019 10:04   Dg Chest Port 1 View  Result Date: 11/26/2018 CLINICAL DATA:  Porta athlete's min EXAM: PORTABLE CHEST 1 VIEW COMPARISON:  Portable exam 1426 hours compared to 01/01/2016 FINDINGS: RIGHT jugular Port-A-Cath with tip projecting over SVC. Upper normal heart size. Mediastinal contours and pulmonary vascularity normal. Subsegmental atelectasis retrocardiac LEFT lower lobe. Minimal dependent atelectasis at RIGHT base. No infiltrate, pleural effusion or pneumothorax. Multilevel endplate spur formation thoracic spine. IMPRESSION: Bibasilar atelectasis. No pneumothorax following Port-A-Cath insertion. Electronically Signed   By: Lavonia Dana M.D.   On: 11/26/2018 14:51   Dg C-arm 1-60 Min-no Report  Result Date: 01/28/2019 Fluoroscopy was utilized by the requesting physician.  No radiographic interpretation.   Dg C-arm 1-60 Min-no Report  Result Date:  11/26/2018 Fluoroscopy was utilized by the requesting physician.  No radiographic interpretation.    [facility] labs CA 19-9 was check on 10/21/2018, level was elevated at 1500.    ASSESSMENT & PLAN:  1. Malignant neoplasm of head of pancreas ([place])   2. Encounter for antineoplastic chemotherapy   3. Neuropathy   4. Normocytic anemia   . # Stage IV pancreatic cancer- Labs are reviewed and discussed with patient CA19.9 1500--> 2101-->1614-->  897-->940 Labs are reviewed and discussed with patient. Counts are acceptable to proceed with cycle 5 gemcitabine and Abraxane day 1 treatments. CT images were independently reviewed by me and discussed with patient.    #Grade 2 neuropathy, will decrease Abraxane to 85 mg/m. Recommend patient to start gabapentin 100 mg 3 times daily.  She may start with 100 mg daily and slowly titrate up to 3 times daily.  Prescription sent to pharmacy.  Side effects were discussed with patient.  #Weight loss, weight is stable.  Follow up with dietitian.   # Bone metastasis Recommend patient to take calcium and vitamin D daily.  Discussed the potential side effects of bisphosphonate including but not limited to- hypocalcemia and osteonecrosis of the jaw; discussed not to have invasive dental procedures few weeks around the time of the infusions. Recommend patient to obtain dental clearance. #Discussed about genetic testing.  Patient has been referred to genetic testing.   All questions were  answered. The patient knows to call the clinic with any problems questions or concerns.  cc Melissa Hector, MD   Return of visit: 1 week 1 week to lab MD Gem/Abraxane for day 8 treatment,  We spent sufficient time to discuss many aspect of care, questions were answered to patient's satisfaction.  Earlie Server, MD, PhD Hematology Oncology [facility] 02/22/2019

## 2019-02-23 LAB — CANCER ANTIGEN 19-9: CA 19-9: 1972 U/mL — ABNORMAL HIGH (ref 0–35)

## 2019-02-25 ENCOUNTER — Inpatient Hospital Stay (HOSPITAL_BASED_OUTPATIENT_CLINIC_OR_DEPARTMENT_OTHER): Payer: Medicare Other | Admitting: Hospice and Palliative Medicine

## 2019-02-25 DIAGNOSIS — Z515 Encounter for palliative care: Secondary | ICD-10-CM

## 2019-02-25 NOTE — Progress Notes (Signed)
I tried calling patient at time of scheduled visit but was unable to reach her.  Will schedule follow-up visit when patient returns to the clinic.

## 2019-03-01 ENCOUNTER — Other Ambulatory Visit: Payer: Self-pay

## 2019-03-01 ENCOUNTER — Inpatient Hospital Stay (HOSPITAL_BASED_OUTPATIENT_CLINIC_OR_DEPARTMENT_OTHER): Payer: Medicare Other | Admitting: Oncology

## 2019-03-01 ENCOUNTER — Inpatient Hospital Stay: Payer: Medicare Other

## 2019-03-01 ENCOUNTER — Encounter: Payer: Self-pay | Admitting: Oncology

## 2019-03-01 ENCOUNTER — Inpatient Hospital Stay (HOSPITAL_BASED_OUTPATIENT_CLINIC_OR_DEPARTMENT_OTHER): Payer: Medicare Other | Admitting: Hospice and Palliative Medicine

## 2019-03-01 VITALS — BP 129/73 | HR 81 | Temp 96.0°F | Wt 142.0 lb

## 2019-03-01 DIAGNOSIS — D490 Neoplasm of unspecified behavior of digestive system: Secondary | ICD-10-CM

## 2019-03-01 DIAGNOSIS — Z515 Encounter for palliative care: Secondary | ICD-10-CM

## 2019-03-01 DIAGNOSIS — D649 Anemia, unspecified: Secondary | ICD-10-CM

## 2019-03-01 DIAGNOSIS — G629 Polyneuropathy, unspecified: Secondary | ICD-10-CM

## 2019-03-01 DIAGNOSIS — Z5111 Encounter for antineoplastic chemotherapy: Secondary | ICD-10-CM

## 2019-03-01 DIAGNOSIS — C250 Malignant neoplasm of head of pancreas: Secondary | ICD-10-CM

## 2019-03-01 LAB — COMPREHENSIVE METABOLIC PANEL
ALT: 33 U/L (ref 0–44)
AST: 52 U/L — ABNORMAL HIGH (ref 15–41)
Albumin: 3.6 g/dL (ref 3.5–5.0)
Alkaline Phosphatase: 365 U/L — ABNORMAL HIGH (ref 38–126)
Anion gap: 10 (ref 5–15)
BUN: 14 mg/dL (ref 8–23)
CO2: 25 mmol/L (ref 22–32)
Calcium: 9.9 mg/dL (ref 8.9–10.3)
Chloride: 105 mmol/L (ref 98–111)
Creatinine, Ser: 0.7 mg/dL (ref 0.44–1.00)
GFR calc Af Amer: >60 mL/min (ref >60)
GFR calc non Af Amer: >60 mL/min (ref >60)
Glucose, Bld: 114 mg/dL — ABNORMAL HIGH (ref 70–99)
Potassium: 4.2 mmol/L (ref 3.5–5.1)
Sodium: 140 mmol/L (ref 135–145)
Total Bilirubin: 0.6 mg/dL (ref 0.3–1.2)
Total Protein: 7.7 g/dL (ref 6.5–8.1)

## 2019-03-01 LAB — CBC WITH DIFFERENTIAL/PLATELET
Abs Immature Granulocytes: 0.8 10*3/uL — ABNORMAL HIGH (ref 0.00–0.07)
Band Neutrophils: 5 %
Basophils Absolute: 0 10*3/uL (ref 0.0–0.1)
Basophils Relative: 0 %
Eosinophils Absolute: 0 10*3/uL (ref 0.0–0.5)
Eosinophils Relative: 0 %
HCT: 36.3 % (ref 36.0–46.0)
Hemoglobin: 11.9 g/dL — ABNORMAL LOW (ref 12.0–15.0)
Lymphocytes Relative: 18 %
Lymphs Abs: 1.5 10*3/uL (ref 0.7–4.0)
MCH: 34.2 pg — ABNORMAL HIGH (ref 26.0–34.0)
MCHC: 32.8 g/dL (ref 30.0–36.0)
MCV: 104.3 fL — ABNORMAL HIGH (ref 80.0–100.0)
Metamyelocytes Relative: 4 %
Monocytes Absolute: 0.6 10*3/uL (ref 0.1–1.0)
Monocytes Relative: 8 %
Myelocytes: 6 %
Neutro Abs: 5.2 10*3/uL (ref 1.7–7.7)
Neutrophils Relative %: 59 %
Platelets: 257 10*3/uL (ref 150–400)
RBC: 3.48 MIL/uL — ABNORMAL LOW (ref 3.87–5.11)
RDW: 15.3 % (ref 11.5–15.5)
WBC: 8.1 10*3/uL (ref 4.0–10.5)
nRBC: 0.6 % — ABNORMAL HIGH (ref 0.0–0.2)

## 2019-03-01 MED ORDER — HEPARIN SOD (PORK) LOCK FLUSH 100 UNIT/ML IV SOLN
500.0000 [IU] | Freq: Once | INTRAVENOUS | Status: AC | PRN
  Administered 2019-03-01: 500 [IU]
  Filled 2019-03-01: qty 5

## 2019-03-01 MED ORDER — SODIUM CHLORIDE 0.9 % IV SOLN
Freq: Once | INTRAVENOUS | Status: AC
  Filled 2019-03-01: qty 250

## 2019-03-01 MED ORDER — SODIUM CHLORIDE 0.9 % IV SOLN
1600.0000 mg | Freq: Once | INTRAVENOUS | Status: AC
  Administered 2019-03-01: 1600 mg via INTRAVENOUS
  Filled 2019-03-01: qty 26.3

## 2019-03-01 MED ORDER — PACLITAXEL PROTEIN-BOUND CHEMO INJECTION 100 MG
85.0000 mg/m2 | Freq: Once | INTRAVENOUS | Status: AC
  Administered 2019-03-01: 150 mg via INTRAVENOUS
  Filled 2019-03-01: qty 30

## 2019-03-01 MED ORDER — PROCHLORPERAZINE MALEATE 10 MG PO TABS
10.0000 mg | ORAL_TABLET | Freq: Once | ORAL | Status: AC
  Administered 2019-03-01: 10 mg via ORAL
  Filled 2019-03-01: qty 1

## 2019-03-01 NOTE — Progress Notes (Signed)
Patient here for follow up. No concerns voiced. Patient has ran out of oxycodone, but doesn't use it too often. She is not sure if she should get it filled just so she has some on hand.

## 2019-03-01 NOTE — Progress Notes (Signed)
Melissa [name]  Telephone:([phone] [phone]   Name: Melissa [name] Date: 03/01/2019 MRN: [id number]  DOB: May 07, 1940  Patient Care Team: Adin Hector, MD as PCP - General (Internal Medicine) Clent Jacks, RN as Oncology Nurse Navigator    REASON FOR CONSULTATION: Palliative Care consult requested for this 78 y.o. female with multiple medical problems including stage IV pancreatic cancer.  Patient was admitted 10/29/2018-10/31/2018 for ERCP and biliary stenting secondary to obstructive jaundice.  PET scan revealed hypermetabolic activity to retroperitoneal periaortic lymph node, sclerotic lesion of the right proximal femur and left sacrum.  Patient was referred to palliative care to help address goals and manage ongoing symptoms.   SOCIAL HISTORY:     reports that she has never smoked. She has never used smokeless tobacco. She reports that she does not drink alcohol or use drugs.   Patient is married and lives at home with her husband.  She was the caregiver for her 93 year old mother but recently had to move her into an ALF and mother subsequently died 6 days later.  Patient has a daughter who lives next door.  She had another daughter who died at age 68 from complications related to spina bifida.  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  ADVANCE DIRECTIVES:  Not on file.  Daughter is reportedly her healthcare power of attorney.  Patient also has a living will.  CODE STATUS: DNR  PAST MEDICAL HISTORY: Past Medical History:  Diagnosis Date  . Glaucoma   . Hypertension   . Osteopenia   . Stroke [facility])     PAST SURGICAL HISTORY:  Past Surgical History:  Procedure Laterality Date  . BREAST BIOPSY Right    core- stereo- neg  . ERCP N/A 10/29/2018   Procedure: ENDOSCOPIC RETROGRADE CHOLANGIOPANCREATOGRAPHY (ERCP);  Surgeon: [name], MD;  Location: [facility] ENDOSCOPY;  Service:  Endoscopy;  Laterality: N/A;  . ERCP N/A 01/28/2019   Procedure: ENDOSCOPIC RETROGRADE CHOLANGIOPANCREATOGRAPHY (ERCP) STENT REMOVAL;  Surgeon: [name], MD;  Location: [facility] ENDOSCOPY;  Service: Endoscopy;  Laterality: N/A;  . EUS N/A 11/18/2018   Procedure: FULL UPPER ENDOSCOPIC ULTRASOUND (EUS) RADIAL;  Surgeon: Holly Bodily, MD;  Location: [facility] ENDOSCOPY;  Service: Gastroenterology;  Laterality: N/A;  . HIP ARTHROPLASTY Left 01/02/2016   Procedure: ARTHROPLASTY BIPOLAR HIP (HEMIARTHROPLASTY);  Surgeon: Dereck Leep, MD;  Location: [facility] ORS;  Service: Orthopedics;  Laterality: Left;  . JOINT REPLACEMENT Left    THR  . PORTACATH PLACEMENT Right 11/26/2018   Procedure: INSERTION PORT-A-CATH;  Surgeon: Jules Husbands, MD;  Location: [facility] ORS;  Service: General;  Laterality: Right;  . TONSILLECTOMY      HEMATOLOGY/ONCOLOGY HISTORY:  Oncology History Overview Note  Melissa [name] is a  78 y.o.  female with PMH listed below was seen in consultation at the request of  Adin Hector, MD  for evaluation of abnormal pancreatic disease. Patient was recently seen by primary care provider Dr. Caryl Comes for evaluation of nausea, diarrhea and jaundice.  Blood work on 10/19/2018 showed potassium 2.9, bilirubin 15, alkaline phosphatase 478, AST 114, ALT 95. Abdomen pelvis CT scan on 10/20/2018 showed moderate to market intra-and extra hepatic biliary dilation with mild diffuse dilatation of the pancreatic duct. Patient has had poor appetite and has lost a 5 to 10 pounds recently. She also had diarrhea.  She noticed sand like stool for 1- 2 weeks.  She takes care of  her 41 year old mother and recently feels she is not able to take care of her anymore due to progressively worsening weakness and fatigue.  She placed her mother to assisted living yesterday. She has had discussion with Dr. Caryl Comes about her blood work and CT scans.  She understands that there is a strong suspicion of cancer.  Nonessential  medication has been stopped. CA 19-9 was check on 10/21/2018, level was elevated at 1500.   # Patient was admitted from 10/29/2018-10/31/2018.  Status post ERCP and biliary stenting.  Patient had duodenum ampulla biopsy showed nondiagnostic.  Negative for invasive carcinoma. Bilirubin trended down to 11 at the time of discharge on 10/31/2018.   #PET scan images were independent reviewed by me and discussed with patient. cTxN2 M1a Patient has at least M1 a disease due to retroperitoneal/peri-aortic lymph node involvement,  sclerotic lesion of right proximal femur with mild hypermetabolic activity, as well as left sacrum   # EUS biopsy of pancreatic mass showed positive for malignancy, Adenocarcinoma. There is not enough tissue for NGS testing.  Attempt to at MMR, still not enough tissue to complete the testing   Malignant neoplasm of head of pancreas ([place])  11/02/2018 Initial Diagnosis   Malignant neoplasm of head of pancreas [facility])    Chemotherapy   The patient had PACLitaxel-protein bound (ABRAXANE) chemo infusion 200 mg, 115 mg/m2 = 225 mg, Intravenous,  Once, 1 of 5 cycles  Administration: 200 mg (11/30/2018)    gemcitabine (GEMZAR) 1,600 mg in sodium chloride 0.9 % 250 mL chemo infusion, 1,710 mg, Intravenous,  Once, 1 of 5 cycles  Administration: 1,600 mg (11/30/2018)  for chemotherapy treatment.       ALLERGIES:  has No Known Allergies.  MEDICATIONS:  Current Outpatient Medications  Medication Sig Dispense Refill  . acetaminophen (TYLENOL) 500 MG tablet Take 500 mg by mouth every 6 (six) hours as needed.    Marland Kitchen amLODipine (NORVASC) 10 MG tablet Take 10 mg by mouth at bedtime.     . docusate sodium (COLACE) 100 MG capsule Take 1 capsule (100 mg total) by mouth daily as needed for mild constipation or moderate constipation. Do not take if you have loose stools 60 capsule 2  . gabapentin (NEURONTIN) 100 MG capsule Take 1 capsule (100 mg total) by mouth 3 (three) times daily. 90 capsule  0  . lidocaine-prilocaine (EMLA) cream Apply to affected area once 30 g 3  . ondansetron (ZOFRAN) 8 MG tablet Take 1 tablet by mouth as needed.    Marland Kitchen oxyCODONE (ROXICODONE) 5 MG immediate release tablet Take 1 tablet (5 mg total) by mouth every 6 (six) hours as needed for moderate pain or severe pain. (Patient not taking: Reported on 03/01/2019) 60 tablet 0  . potassium chloride (K-DUR) 10 MEQ tablet Take 1 tablet (10 mEq total) by mouth daily. (Patient taking differently: Take 10 mEq by mouth 2 (two) times daily. ) 30 tablet 0  . prochlorperazine (COMPAZINE) 10 MG tablet Take 1 tablet by mouth as needed.     No current facility-administered medications for this visit.    VITAL SIGNS: There were no vitals taken for this visit. There were no vitals filed for this visit.  Estimated body mass index is 27.73 kg/m as calculated from the following:   Height as of 01/28/19: 5' (1.524 m).   Weight as of an earlier encounter on 03/01/19: 142 lb (64.4 kg).  LABS: CBC:    Component Value Date/Time   WBC 8.1 03/01/2019 0811  HGB 11.9 (L) 03/01/2019 0811   HCT 36.3 03/01/2019 0811   PLT 257 03/01/2019 0811   MCV 104.3 (H) 03/01/2019 0811   NEUTROABS 5.2 03/01/2019 0811   LYMPHSABS 1.5 03/01/2019 0811   MONOABS 0.6 03/01/2019 0811   EOSABS 0.0 03/01/2019 0811   BASOSABS 0.0 03/01/2019 0811   Comprehensive Metabolic Panel:    Component Value Date/Time   NA 140 03/01/2019 0811   K 4.2 03/01/2019 0811   CL 105 03/01/2019 0811   CO2 25 03/01/2019 0811   BUN 14 03/01/2019 0811   CREATININE 0.70 03/01/2019 0811   GLUCOSE 114 (H) 03/01/2019 0811   CALCIUM 9.9 03/01/2019 0811   AST 52 (H) 03/01/2019 0811   ALT 33 03/01/2019 0811   ALKPHOS 365 (H) 03/01/2019 0811   BILITOT 0.6 03/01/2019 0811   PROT 7.7 03/01/2019 0811   ALBUMIN 3.6 03/01/2019 0811    RADIOGRAPHIC STUDIES: CT Abdomen Pelvis W Contrast  Result Date: 02/15/2019 CLINICAL DATA:  Pancreatic adenocarcinoma. EXAM: CT ABDOMEN  AND PELVIS WITH CONTRAST TECHNIQUE: Multidetector CT imaging of the abdomen and pelvis was performed using the standard protocol following bolus administration of intravenous contrast. CONTRAST:  90m OMNIPAQUE IOHEXOL 300 MG/ML  SOLN COMPARISON:  10/20/2018 FINDINGS: Lower chest: Unremarkable. Hepatobiliary: 8 mm hypodensity in the inferior right liver adjacent to the gallbladder (25/2) is new in the interval. 9 mm hypodensity in the tip of the left liver is stable and likely a cyst. Another 5 mm hypodensity in the dome of the liver is stable. Multiple gallstones evident with gas visible in the gallbladder lumen. Intrahepatic biliary duct dilatation seen previously has decreased with common bile duct stent visualized in situ. Pancreas: Pancreatic parenchymal atrophy noted with dilatation of the main duct in the body and tail of pancreas. No discrete pancreatic mass lesion evident although an ill-defined area of hypoenhancement in the head of the pancreas measures approximately 3 x 2.6 cm today. Spleen: No splenomegaly. No focal mass lesion. Adrenals/Urinary Tract: No adrenal nodule or mass. Kidneys unremarkable. No evidence for hydroureter. The urinary bladder appears normal for the degree of distention. Stomach/Bowel: Tiny hiatal hernia. Stomach otherwise unremarkable. Duodenum is normally positioned as is the ligament of Treitz. No small bowel wall thickening. No small bowel dilatation. The terminal ileum is normal. The appendix is normal. No gross colonic mass. No colonic wall thickening. Diverticular changes are noted in the left colon without evidence of diverticulitis. Vascular/Lymphatic: There is abdominal aortic atherosclerosis without aneurysm. Portal vein, superior mesenteric vein, and splenic vein are patent. Celiac axis and SMA are patent. Mild lymphadenopathy in the gastrohepatic and hepato duodenal ligaments is not substantially changed. Index gastrohepatic ligament lymph node measures 9 mm short axis  on 14/2. 15 mm retrocaval node on 21/2 was 18 mm short axis previously. Left para-aortic node measuring 1.4 cm short axis on 23/2 was 1.4 cm previously. No pelvic sidewall lymphadenopathy. Reproductive: The uterus is unremarkable.  There is no adnexal mass. Other: Trace free fluid noted in the cul-de-sac. Musculoskeletal: Status post left total hip replacement. Interval development of multiple lytic and sclerotic bone lesions consistent with metastatic involvement. 2.8 cm lesion identified left iliac bone on 47/2. New 13 mm lytic and sclerotic lesion in the left L2 vertebral body noted on 24/2. 14 mm sclerotic lesion in the sacrum visible on 51/2. IMPRESSION: 1. Interval development of multiple lytic and sclerotic bone lesions consistent with bony metastatic involvement. 2. Decrease in intrahepatic biliary duct dilatation with common bile duct stent visualized  in situ. 3. Ill-defined hypoenhancing pancreatic head mass measuring 3.0 x 2.6 cm today. 4. New 8 mm hypodensity in the inferior right liver adjacent to the gallbladder. Attention on follow-up recommended. 5. Upper abdominal lymphadenopathy shows no substantial change. 6.  Aortic Atherosclerois (ICD10-170.0) Electronically Signed   By: Misty Stanley M.D.   On: 02/15/2019 10:04    PERFORMANCE STATUS (ECOG) : 2 - Symptomatic, <50% confined to bed  Review of Systems Unless otherwise noted, a complete review of systems is negative.  Physical Exam General: NAD, frail appearing, thin Pulmonary: Unlabored Extremities: no edema, no joint deformities Skin: no rashes Neurological: Weakness but otherwise nonfocal  IMPRESSION: Routine follow-up visit made today.  Patient was seen in the infusion room.  Patient reports that she is doing reasonably well.  She denies any acute changes or concerns today.  She denies any distressing symptoms.  Patient reports that her oral intake has improved.  She remains functionally independent with her own care.  She  feels she is coping reasonably well with her cancer and treatment.  Patient previously did a DNR but will benefit from having documents scanned into [employer].  Will readdress with patient during future visit.  PLAN: -Continue current scope of treatment -DNR previously but will readdress to scanned documents into [employer] -RTC in 3 to 4 weeks   Patient expressed understanding and was in agreement with this plan. She also understands that She can call the clinic at any time with any questions, concerns, or complaints.     Time Total: 15 minutes  Visit consisted of counseling and education dealing with the complex and emotionally intense issues of symptom management and palliative care in the setting of serious and potentially life-threatening illness.Greater than 50%  of this time was spent counseling and coordinating care related to the above assessment and plan.  Signed by: Altha Harm, PhD, NP-C [phone] (Work Cell)

## 2019-03-01 NOTE — Progress Notes (Signed)
Hematology/Oncology follow up note [facility] [phone] [phone]   Patient Care Team: Adin Hector, MD as PCP - General (Internal Medicine) Clent Jacks, RN as Oncology Nurse Navigator  REFERRING PROVIDER: Adin Hector, MD  CHIEF COMPLAINTS/REASON FOR VISIT:  Follow-up for pancreatic cancer   HISTORY OF PRESENTING ILLNESS:   Melissa [name] is a  78 y.o.  female with PMH listed below was seen in consultation at the request of  Adin Hector, MD  for evaluation of abnormal pancreatic disease. Patient was recently seen by primary care provider Dr. Caryl Comes for evaluation of nausea, diarrhea and jaundice.  Blood work on 10/19/2018 showed potassium 2.9, bilirubin 15, alkaline phosphatase 478, AST 114, ALT 95. Abdomen pelvis CT scan on 10/20/2018 showed moderate to market intra-and extra hepatic biliary dilation with mild diffuse dilatation of the pancreatic duct. Patient has had poor appetite and has lost a 5 to 10 pounds recently. She also had diarrhea.  She noticed sand like stool for 1- 2 weeks.  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  She placed her mother to assisted living yesterday. She has had discussion with Dr. Caryl Comes about her blood work and CT scans.  She understands that there is a strong suspicion of cancer.  Nonessential medication has been stopped. CA 19-9 was check on 10/21/2018, level was elevated at 1500.  Today she denies any pain.  Itchy all over her body.. Per patient's request, patient's daughter Melissa [name] was Melissa [name] and Melissa [name] was able to hear entire clinical encounter conversation and participate in the reported history and discussion.  # Patient was admitted from 10/29/2018-10/31/2018.  Status post ERCP and biliary stenting.  Patient had duodenum ampulla biopsy showed nondiagnostic.  Negative for  invasive carcinoma. Bilirubin trended down to 11 at the time of discharge on 10/31/2018.  #PET scan images were independent reviewed by me and discussed with patient. cTxN2 M1a Patient has at least M1 a disease due to retroperitoneal/peri-aortic lymph node involvement,  sclerotic lesion of right proximal femur with mild hypermetabolic activity, as well as left sacrum  # EUS biopsy of pancreatic mass showed positive for malignancy, Adenocarcinoma. There is not enough tissue for NGS testing.  Attempt to at MMR, still not enough tissue to complete the testing.  INTERVAL HISTORY Melissa [name] is a 78 y.o. female who has above history reviewed by me today presents for follow up visit for management of stage IV pancreatic cancer Patient has been on palliative chemotherapy with gemcitabine and Abraxane.   Today she is accompanied by husband. She reports tolerating the chemotherapy pretty well. Appetite remains to be fair. Grade 2 neuropathy, on gabapentin.  Symptoms are stable. Denies any nausea, vomiting, fever, chills, abdominal pain.  Review of Systems  Constitutional: Positive for fatigue. Negative for appetite change, chills and fever.  HENT:   Negative for hearing loss and voice change.   Eyes: Negative for eye problems and icterus.  Respiratory: Negative for chest tightness and cough.   Cardiovascular: Negative for chest pain.  Gastrointestinal: Negative for abdominal distention, abdominal pain and blood in stool.  Endocrine: Negative for hot flashes.  Genitourinary: Negative for difficulty urinating and frequency.   Musculoskeletal: Negative for arthralgias.  Skin: Negative for itching and rash.  Neurological: Positive for numbness. Negative for extremity weakness.  Hematological: Negative for adenopathy.  Psychiatric/Behavioral:  Negative for confusion.    MEDICAL HISTORY:  Past Medical History:  Diagnosis Date  . Glaucoma   . Hypertension   . Osteopenia   . Stroke [facility])      SURGICAL HISTORY: Past Surgical History:  Procedure Laterality Date  . BREAST BIOPSY Right    core- stereo- neg  . ERCP N/A 10/29/2018   Procedure: ENDOSCOPIC RETROGRADE CHOLANGIOPANCREATOGRAPHY (ERCP);  Surgeon: [name], MD;  Location: [facility] ENDOSCOPY;  Service: Endoscopy;  Laterality: N/A;  . ERCP N/A 01/28/2019   Procedure: ENDOSCOPIC RETROGRADE CHOLANGIOPANCREATOGRAPHY (ERCP) STENT REMOVAL;  Surgeon: [name], MD;  Location: [facility] ENDOSCOPY;  Service: Endoscopy;  Laterality: N/A;  . EUS N/A 11/18/2018   Procedure: FULL UPPER ENDOSCOPIC ULTRASOUND (EUS) RADIAL;  Surgeon: Holly Bodily, MD;  Location: [facility] ENDOSCOPY;  Service: Gastroenterology;  Laterality: N/A;  . HIP ARTHROPLASTY Left 01/02/2016   Procedure: ARTHROPLASTY BIPOLAR HIP (HEMIARTHROPLASTY);  Surgeon: Dereck Leep, MD;  Location: [facility] ORS;  Service: Orthopedics;  Laterality: Left;  . JOINT REPLACEMENT Left    THR  . PORTACATH PLACEMENT Right 11/26/2018   Procedure: INSERTION PORT-A-CATH;  Surgeon: Jules Husbands, MD;  Location: [facility] ORS;  Service: General;  Laterality: Right;  . TONSILLECTOMY      SOCIAL HISTORY: Social History   Socioeconomic History  . Marital status: Married    Spouse name: Not on file  . Number of children: Not on file  . Years of education: Not on file  . Highest education level: Not on file  Occupational History  . Not on file  Tobacco Use  . Smoking status: Never Smoker  . Smokeless tobacco: Never Used  Substance and Sexual Activity  . Alcohol use: No  . Drug use: Never  . Sexual activity: Not on file  Other Topics Concern  . Not on file  Social History Narrative  . Not on file   Social Determinants of Health   Financial Resource Strain:   . Difficulty of Paying Living Expenses: Not on file  Food Insecurity:   . Worried About Charity fundraiser in the Last Year: Not on file  . Ran Out of Food in the Last Year: Not on file  Transportation Needs:   . Lack of  Transportation (Medical): Not on file  . Lack of Transportation (Non-Medical): Not on file  Physical Activity:   . Days of Exercise per Week: Not on file  . Minutes of Exercise per Session: Not on file  Stress:   . Feeling of Stress : Not on file  Social Connections:   . Frequency of Communication with Friends and Family: Not on file  . Frequency of Social Gatherings with Friends and Family: Not on file  . Attends Religious Services: Not on file  . Active Member of Clubs or Organizations: Not on file  . Attends Archivist Meetings: Not on file  . Marital Status: Not on file  Intimate Partner Violence:   . Fear of Current or Ex-Partner: Not on file  . Emotionally Abused: Not on file  . Physically Abused: Not on file  . Sexually Abused: Not on file    FAMILY HISTORY: Family History  Problem Relation Age of Onset  . Kidney disease Father   . Breast cancer Neg Hx     ALLERGIES:  has No Known Allergies.  MEDICATIONS:  Current Outpatient Medications  Medication Sig Dispense Refill  . acetaminophen (TYLENOL) 500 MG tablet Take 500 mg by mouth every 6 (six) hours as  needed.    Marland Kitchen amLODipine (NORVASC) 10 MG tablet Take 10 mg by mouth at bedtime.     . docusate sodium (COLACE) 100 MG capsule Take 1 capsule (100 mg total) by mouth daily as needed for mild constipation or moderate constipation. Do not take if you have loose stools 60 capsule 2  . gabapentin (NEURONTIN) 100 MG capsule Take 1 capsule (100 mg total) by mouth 3 (three) times daily. 90 capsule 0  . lidocaine-prilocaine (EMLA) cream Apply to affected area once 30 g 3  . ondansetron (ZOFRAN) 8 MG tablet Take 1 tablet by mouth as needed.    . potassium chloride (K-DUR) 10 MEQ tablet Take 1 tablet (10 mEq total) by mouth daily. (Patient taking differently: Take 10 mEq by mouth 2 (two) times daily. ) 30 tablet 0  . prochlorperazine (COMPAZINE) 10 MG tablet Take 1 tablet by mouth as needed.    Marland Kitchen oxyCODONE (ROXICODONE) 5 MG  immediate release tablet Take 1 tablet (5 mg total) by mouth every 6 (six) hours as needed for moderate pain or severe pain. (Patient not taking: Reported on 03/01/2019) 60 tablet 0   No current facility-administered medications for this visit.     PHYSICAL EXAMINATION: ECOG PERFORMANCE STATUS: 2 - Symptomatic, <50% confined to bed Vitals:   03/01/19 0830  BP: 129/73  Pulse: 81  Temp: (!) 96 F (35.6 C)   Filed Weights   03/01/19 0830  Weight: 142 lb (64.4 kg)    Physical Exam Constitutional:      General: She is not in acute distress.    Appearance: She is ill-appearing.     Comments: Frail, sits in wheelchair.  HENT:     Head: Normocephalic and atraumatic.     Comments: Alopecia Eyes:     General: No scleral icterus.    Pupils: Pupils are equal, round, and reactive to light.  Cardiovascular:     Rate and Rhythm: Normal rate and regular rhythm.     Heart sounds: Normal heart sounds.  Pulmonary:     Effort: Pulmonary effort is normal. No respiratory distress.     Breath sounds: Normal breath sounds. No wheezing.  Abdominal:     General: Bowel sounds are normal. There is no distension.     Palpations: Abdomen is soft. There is no mass.     Tenderness: There is no abdominal tenderness.  Musculoskeletal:        General: No deformity. Normal range of motion.     Cervical back: Normal range of motion and neck supple.  Skin:    General: Skin is warm and dry.     Coloration: Skin is pale. Skin is not jaundiced.     Findings: No erythema or rash.  Neurological:     Mental Status: She is alert and oriented to person, place, and time.     Cranial Nerves: No cranial nerve deficit.     Coordination: Coordination normal.  Psychiatric:        Behavior: Behavior normal.        Thought Content: Thought content normal.     LABORATORY DATA:  I have reviewed the data as listed Lab Results  Component Value Date   WBC 8.1 03/01/2019   HGB 11.9 (L) 03/01/2019   HCT 36.3  03/01/2019   MCV 104.3 (H) 03/01/2019   PLT 257 03/01/2019   Recent Labs    10/29/18 1318 02/08/19 0817 02/22/19 0820 03/01/19 0811  NA 135 138 138 140  K 3.0*  3.9 3.8 4.2  CL 104 103 104 105  CO2 21* '26 25 25  ' GLUCOSE 110* 168* 154* 114*  BUN '13 14 14 14  ' CREATININE 0.32* 0.69 0.56 0.70  CALCIUM 9.0 9.2 9.4 9.9  GFRNONAA >60 >60 >60 >60  GFRAA >60 >60 >60 >60  PROT 7.0 7.6 7.8 7.7  ALBUMIN 2.6* 3.6 3.9 3.6  AST 119* 37 32 52*  ALT 79* 25 28 33  ALKPHOS 399* 194* 413* 365*  BILITOT 18.2* 0.5 0.5 0.6  BILIDIR 10.2*  --   --   --    Iron/TIBC/Ferritin/ %Sat No results found for: IRON, TIBC, FERRITIN, IRONPCTSAT   RADIOGRAPHIC STUDIES: I have personally reviewed the radiological images as listed and agreed with the findings in the report. CT Abdomen Pelvis W Contrast  Result Date: 02/15/2019 CLINICAL DATA:  Pancreatic adenocarcinoma. EXAM: CT ABDOMEN AND PELVIS WITH CONTRAST TECHNIQUE: Multidetector CT imaging of the abdomen and pelvis was performed using the standard protocol following bolus administration of intravenous contrast. CONTRAST:  3m OMNIPAQUE IOHEXOL 300 MG/ML  SOLN COMPARISON:  10/20/2018 FINDINGS: Lower chest: Unremarkable. Hepatobiliary: 8 mm hypodensity in the inferior right liver adjacent to the gallbladder (25/2) is new in the interval. 9 mm hypodensity in the tip of the left liver is stable and likely a cyst. Another 5 mm hypodensity in the dome of the liver is stable. Multiple gallstones evident with gas visible in the gallbladder lumen. Intrahepatic biliary duct dilatation seen previously has decreased with common bile duct stent visualized in situ. Pancreas: Pancreatic parenchymal atrophy noted with dilatation of the main duct in the body and tail of pancreas. No discrete pancreatic mass lesion evident although an ill-defined area of hypoenhancement in the head of the pancreas measures approximately 3 x 2.6 cm today. Spleen: No splenomegaly. No focal mass  lesion. Adrenals/Urinary Tract: No adrenal nodule or mass. Kidneys unremarkable. No evidence for hydroureter. The urinary bladder appears normal for the degree of distention. Stomach/Bowel: Tiny hiatal hernia. Stomach otherwise unremarkable. Duodenum is normally positioned as is the ligament of Treitz. No small bowel wall thickening. No small bowel dilatation. The terminal ileum is normal. The appendix is normal. No gross colonic mass. No colonic wall thickening. Diverticular changes are noted in the left colon without evidence of diverticulitis. Vascular/Lymphatic: There is abdominal aortic atherosclerosis without aneurysm. Portal vein, superior mesenteric vein, and splenic vein are patent. Celiac axis and SMA are patent. Mild lymphadenopathy in the gastrohepatic and hepato duodenal ligaments is not substantially changed. Index gastrohepatic ligament lymph node measures 9 mm short axis on 14/2. 15 mm retrocaval node on 21/2 was 18 mm short axis previously. Left para-aortic node measuring 1.4 cm short axis on 23/2 was 1.4 cm previously. No pelvic sidewall lymphadenopathy. Reproductive: The uterus is unremarkable.  There is no adnexal mass. Other: Trace free fluid noted in the cul-de-sac. Musculoskeletal: Status post left total hip replacement. Interval development of multiple lytic and sclerotic bone lesions consistent with metastatic involvement. 2.8 cm lesion identified left iliac bone on 47/2. New 13 mm lytic and sclerotic lesion in the left L2 vertebral body noted on 24/2. 14 mm sclerotic lesion in the sacrum visible on 51/2. IMPRESSION: 1. Interval development of multiple lytic and sclerotic bone lesions consistent with bony metastatic involvement. 2. Decrease in intrahepatic biliary duct dilatation with common bile duct stent visualized in situ. 3. Ill-defined hypoenhancing pancreatic head mass measuring 3.0 x 2.6 cm today. 4. New 8 mm hypodensity in the inferior right liver adjacent to  the gallbladder.  Attention on follow-up recommended. 5. Upper abdominal lymphadenopathy shows no substantial change. 6.  Aortic Atherosclerois (ICD10-170.0) Electronically Signed   By: Misty Stanley M.D.   On: 02/15/2019 10:04   DG C-Arm 1-60 Min-No Report  Result Date: 01/28/2019 Fluoroscopy was utilized by the requesting physician.  No radiographic interpretation.    [facility] labs CA 19-9 was check on 10/21/2018, level was elevated at 1500.    ASSESSMENT & PLAN:  1. Malignant neoplasm of head of pancreas ([place])   2. Normocytic anemia   3. Neuropathy   4. Encounter for antineoplastic chemotherapy   . # Stage IV pancreatic cancer- Labs are reviewed and discussed with patient CA19.9 1500--> 2101-->1614-->  897-->940--> 1972 Labs are reviewed and discussed with patient. Her counts are stable and acceptable to proceed with cycle 5 gemcitabine and Abraxane day 8 treatment. However CA 19-9 that was drawn at the last visit showed significant increase. Continue current regimen.  I will repeat another CA 19-9 in 2 weeks. If persistently increasing, I will repeat short-term image follow-up.    #Intermittent back pain, extensive bone metastasis on his recent CT scan. Awaiting dental clearance to start patient on Zometa. Obtain MRI lumbar for further evaluation. May consider radiation if symptoms persist.  #Grade 2 neuropathy, Abraxane has been decreased to 85 mg/m. Continue gabapentin 100 mg 3 times daily.  Weight has been stable.  Continue follow-up with dietitian. Palliative care following . #Discussed about genetic testing.  Patient has been referred to genetic testing.   All questions were answered. The patient knows to call the clinic with any problems questions or concerns.  cc Adin Hector, MD   Return of visit: 2 weeks   We spent sufficient time to discuss many aspect of care, questions were answered to patient's satisfaction.  Earlie Server, MD, PhD Hematology Oncology [facility] 03/01/2019

## 2019-03-03 ENCOUNTER — Telehealth: Payer: Self-pay | Admitting: *Deleted

## 2019-03-03 NOTE — Telephone Encounter (Signed)
Patient informed no need for COVID test

## 2019-03-03 NOTE — Telephone Encounter (Signed)
COVID testing not required prior to scans.

## 2019-03-03 NOTE — Telephone Encounter (Signed)
Patient called and stated that she saw her dentist yesterday and he will be faxing you. She is also asking if she needs a COVID test before her scan 12/24. Please advise

## 2019-03-10 ENCOUNTER — Ambulatory Visit: Payer: Medicare Other

## 2019-03-10 ENCOUNTER — Ambulatory Visit
Admission: RE | Admit: 2019-03-10 | Discharge: 2019-03-10 | Disposition: A | Discharge status: Home / Self Care | Payer: Medicare Other | Source: Ambulatory Visit | Attending: Oncology | Admitting: Oncology

## 2019-03-10 ENCOUNTER — Other Ambulatory Visit: Payer: Self-pay

## 2019-03-10 DIAGNOSIS — C250 Malignant neoplasm of head of pancreas: Secondary | ICD-10-CM | POA: Diagnosis present

## 2019-03-10 MED ORDER — GADOBUTROL 1 MMOL/ML IV SOLN
6.0000 mL | Freq: Once | INTRAVENOUS | Status: AC | PRN
  Administered 2019-03-10: 14:00:00 6 mL via INTRAVENOUS

## 2019-03-15 ENCOUNTER — Other Ambulatory Visit: Payer: Self-pay

## 2019-03-15 ENCOUNTER — Inpatient Hospital Stay (HOSPITAL_BASED_OUTPATIENT_CLINIC_OR_DEPARTMENT_OTHER): Payer: Medicare Other | Admitting: Oncology

## 2019-03-15 ENCOUNTER — Inpatient Hospital Stay: Payer: Medicare Other

## 2019-03-15 ENCOUNTER — Encounter: Payer: Self-pay | Admitting: Oncology

## 2019-03-15 ENCOUNTER — Inpatient Hospital Stay (HOSPITAL_BASED_OUTPATIENT_CLINIC_OR_DEPARTMENT_OTHER): Payer: Medicare Other | Admitting: Hospice and Palliative Medicine

## 2019-03-15 VITALS — BP 136/83 | HR 96 | Temp 97.4°F | Resp 16 | Wt 136.7 lb

## 2019-03-15 DIAGNOSIS — C250 Malignant neoplasm of head of pancreas: Secondary | ICD-10-CM | POA: Diagnosis not present

## 2019-03-15 DIAGNOSIS — C7951 Secondary malignant neoplasm of bone: Secondary | ICD-10-CM

## 2019-03-15 DIAGNOSIS — Z515 Encounter for palliative care: Secondary | ICD-10-CM

## 2019-03-15 DIAGNOSIS — G629 Polyneuropathy, unspecified: Secondary | ICD-10-CM

## 2019-03-15 DIAGNOSIS — R634 Abnormal weight loss: Secondary | ICD-10-CM

## 2019-03-15 DIAGNOSIS — Z95828 Presence of other vascular implants and grafts: Secondary | ICD-10-CM

## 2019-03-15 DIAGNOSIS — Z5111 Encounter for antineoplastic chemotherapy: Secondary | ICD-10-CM

## 2019-03-15 DIAGNOSIS — D649 Anemia, unspecified: Secondary | ICD-10-CM

## 2019-03-15 DIAGNOSIS — D490 Neoplasm of unspecified behavior of digestive system: Secondary | ICD-10-CM

## 2019-03-15 DIAGNOSIS — G893 Neoplasm related pain (acute) (chronic): Secondary | ICD-10-CM

## 2019-03-15 LAB — CBC WITH DIFFERENTIAL/PLATELET
Abs Immature Granulocytes: 0.05 10*3/uL (ref 0.00–0.07)
Basophils Absolute: 0 10*3/uL (ref 0.0–0.1)
Basophils Relative: 0 %
Eosinophils Absolute: 0.1 10*3/uL (ref 0.0–0.5)
Eosinophils Relative: 1 %
HCT: 36.6 % (ref 36.0–46.0)
Hemoglobin: 11.8 g/dL — ABNORMAL LOW (ref 12.0–15.0)
Immature Granulocytes: 1 %
Lymphocytes Relative: 10 %
Lymphs Abs: 0.8 10*3/uL (ref 0.7–4.0)
MCH: 34.6 pg — ABNORMAL HIGH (ref 26.0–34.0)
MCHC: 32.2 g/dL (ref 30.0–36.0)
MCV: 107.3 fL — ABNORMAL HIGH (ref 80.0–100.0)
Monocytes Absolute: 1.2 10*3/uL — ABNORMAL HIGH (ref 0.1–1.0)
Monocytes Relative: 15 %
Neutro Abs: 5.9 10*3/uL (ref 1.7–7.7)
Neutrophils Relative %: 73 %
Platelets: 380 10*3/uL (ref 150–400)
RBC: 3.41 MIL/uL — ABNORMAL LOW (ref 3.87–5.11)
RDW: 16 % — ABNORMAL HIGH (ref 11.5–15.5)
WBC: 8 10*3/uL (ref 4.0–10.5)
nRBC: 0 % (ref 0.0–0.2)

## 2019-03-15 LAB — COMPREHENSIVE METABOLIC PANEL
ALT: 19 U/L (ref 0–44)
AST: 29 U/L (ref 15–41)
Albumin: 3.5 g/dL (ref 3.5–5.0)
Alkaline Phosphatase: 535 U/L — ABNORMAL HIGH (ref 38–126)
Anion gap: 11 (ref 5–15)
BUN: 11 mg/dL (ref 8–23)
CO2: 24 mmol/L (ref 22–32)
Calcium: 9.3 mg/dL (ref 8.9–10.3)
Chloride: 102 mmol/L (ref 98–111)
Creatinine, Ser: 0.62 mg/dL (ref 0.44–1.00)
GFR calc Af Amer: >60 mL/min (ref >60)
GFR calc non Af Amer: >60 mL/min (ref >60)
Glucose, Bld: 171 mg/dL — ABNORMAL HIGH (ref 70–99)
Potassium: 3.5 mmol/L (ref 3.5–5.1)
Sodium: 137 mmol/L (ref 135–145)
Total Bilirubin: 0.6 mg/dL (ref 0.3–1.2)
Total Protein: 8.1 g/dL (ref 6.5–8.1)

## 2019-03-15 MED ORDER — HEPARIN SOD (PORK) LOCK FLUSH 100 UNIT/ML IV SOLN
500.0000 [IU] | Freq: Once | INTRAVENOUS | Status: AC | PRN
  Administered 2019-03-15: 11:00:00 500 [IU]
  Filled 2019-03-15: qty 5

## 2019-03-15 MED ORDER — SODIUM CHLORIDE 0.9% FLUSH
10.0000 mL | Freq: Once | INTRAVENOUS | Status: AC
  Administered 2019-03-15: 08:00:00 10 mL via INTRAVENOUS
  Filled 2019-03-15: qty 10

## 2019-03-15 MED ORDER — SODIUM CHLORIDE 0.9 % IV SOLN
Freq: Once | INTRAVENOUS | Status: AC
  Filled 2019-03-15: qty 250

## 2019-03-15 MED ORDER — PROCHLORPERAZINE MALEATE 10 MG PO TABS
10.0000 mg | ORAL_TABLET | Freq: Once | ORAL | Status: AC
  Administered 2019-03-15: 10 mg via ORAL
  Filled 2019-03-15: qty 1

## 2019-03-15 MED ORDER — PACLITAXEL PROTEIN-BOUND CHEMO INJECTION 100 MG
85.0000 mg/m2 | Freq: Once | INTRAVENOUS | Status: AC
  Administered 2019-03-15: 150 mg via INTRAVENOUS
  Filled 2019-03-15: qty 30

## 2019-03-15 MED ORDER — HEPARIN SOD (PORK) LOCK FLUSH 100 UNIT/ML IV SOLN
INTRAVENOUS | Status: AC
  Filled 2019-03-15: qty 5

## 2019-03-15 MED ORDER — OXYCODONE HCL 5 MG PO TABS: 5.0000 mg | ORAL_TABLET | Freq: Four times a day (QID) | ORAL | 0 refills | Status: DC | PRN

## 2019-03-15 MED ORDER — SODIUM CHLORIDE 0.9 % IV SOLN
1600.0000 mg | Freq: Once | INTRAVENOUS | Status: AC
  Administered 2019-03-15: 1600 mg via INTRAVENOUS
  Filled 2019-03-15: qty 26.3

## 2019-03-15 NOTE — Progress Notes (Signed)
Patient reports feeling more tired.  Did have an episode of leg weakness at [facility] where she went "down" at check out.  She has a 6 lb wt loss since last documented wt.136/83

## 2019-03-15 NOTE — Progress Notes (Signed)
Hematology/Oncology follow up note [name] Telephone:(336) [phone] Fax:([phone]) [phone]   Patient Care Team: Adin Hector, MD as PCP - General (Internal Medicine) Clent Jacks, RN as Oncology Nurse Navigator  REFERRING PROVIDER: Adin Hector, MD  CHIEF COMPLAINTS/REASON FOR VISIT:  Follow-up for pancreatic cancer   HISTORY OF PRESENTING ILLNESS:   Melissa [name] is a  78 y.o.  female with PMH listed below was seen in consultation at the request of  Adin Hector, MD  for evaluation of abnormal pancreatic disease. Patient was recently seen by primary care provider Dr. Caryl [name] for evaluation of nausea, diarrhea and jaundice.  Blood work on 10/19/2018 showed potassium 2.9, bilirubin 15, alkaline phosphatase 478, AST 114, ALT 95. Abdomen pelvis CT scan on 10/20/2018 showed moderate to market intra-and extra hepatic biliary dilation with mild diffuse dilatation of the pancreatic duct. Patient has had poor appetite and has lost a 5 to 10 pounds recently. She also had diarrhea.  She noticed sand like stool for 1- 2 weeks.  She takes care of her 62 year old mother and recently feels she is not able to take care of her anymore due to progressively worsening weakness and fatigue.  She placed her mother to assisted living yesterday. She has had discussion with Dr. Caryl [name] about her blood work and CT scans.  She understands that there is a strong suspicion of cancer.  Nonessential medication has been stopped. CA 19-9 was check on 10/21/2018, level was elevated at 1500.  Today she denies any pain.  Itchy all over her body.. Per patient's request, patient's daughter Melissa [name] was [name] and Melissa [name] was able to hear entire clinical encounter conversation and participate in the reported history and discussion.  # Patient was admitted from 10/29/2018-10/31/2018.  Status post ERCP and biliary stenting.  Patient had duodenum ampulla biopsy showed nondiagnostic.  Negative for  invasive carcinoma. Bilirubin trended down to 11 at the time of discharge on 10/31/2018.  #PET scan images were independent reviewed by me and discussed with patient. cTxN2 M1a Patient has at least M1 a disease due to retroperitoneal/peri-aortic lymph node involvement,  sclerotic lesion of right proximal femur with mild hypermetabolic activity, as well as left sacrum  # EUS biopsy of pancreatic mass showed positive for malignancy, Adenocarcinoma. There is not enough tissue for NGS testing.  Attempt to at MMR, still not enough tissue to complete the testing.  INTERVAL HISTORY Melissa [name] is a 78 y.o. female who has above history reviewed by me today presents for follow up visit for management of stage IV pancreatic cancer Patient has been on palliative chemotherapy with gemcitabine and Abraxane.   Today patient reports feeling tired and fatigued. 2 weeks ago she felt that her lower extremity gave out when she was waiting in the line at [facility] for checkout.  Afterwards she recovered and today she does not feel any focal lower extremity weakness.  Appetite is good.  Continue to lose weight.  Lost 6 pounds since 2 weeks ago. 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  She feels the symptoms are stable.  Some days are worse than other days. Denies any nausea, vomiting, fever, chills or abdominal pain. She reports having back pain.  8-9 out of 10.  She has not used any pain pill.  Request oxycodone refills.   Review of Systems  Constitutional: Positive for fatigue. Negative for appetite change, chills and fever.  HENT:   Negative for hearing loss and voice change.  Eyes: Negative for eye problems and icterus.  Respiratory: Negative for chest tightness and cough.   Cardiovascular: Negative for chest pain.  Gastrointestinal: Negative for abdominal distention, abdominal pain and blood in stool.  Endocrine: Negative for hot flashes.  Genitourinary: Negative for difficulty urinating and  frequency.   Musculoskeletal: Negative for arthralgias.  Skin: Negative for itching and rash.  Neurological: Positive for numbness. Negative for extremity weakness.  Hematological: Negative for adenopathy.  Psychiatric/Behavioral: Negative for confusion.    MEDICAL HISTORY:  Past Medical History:  Diagnosis Date   Glaucoma    Hypertension    Osteopenia    Stroke [facility])     SURGICAL HISTORY: Past Surgical History:  Procedure Laterality Date   BREAST BIOPSY Right    core- stereo- neg   ERCP N/A 10/29/2018   Procedure: ENDOSCOPIC RETROGRADE CHOLANGIOPANCREATOGRAPHY (ERCP);  Surgeon: [name], MD;  Location: [facility] ENDOSCOPY;  Service: Endoscopy;  Laterality: N/A;   ERCP N/A 01/28/2019   Procedure: ENDOSCOPIC RETROGRADE CHOLANGIOPANCREATOGRAPHY (ERCP) STENT REMOVAL;  Surgeon: [name], MD;  Location: [facility] ENDOSCOPY;  Service: Endoscopy;  Laterality: N/A;   EUS N/A 11/18/2018   Procedure: FULL UPPER ENDOSCOPIC ULTRASOUND (EUS) RADIAL;  Surgeon: Holly Bodily, MD;  Location: [facility] ENDOSCOPY;  Service: Gastroenterology;  Laterality: N/A;   HIP ARTHROPLASTY Left 01/02/2016   Procedure: ARTHROPLASTY BIPOLAR HIP (HEMIARTHROPLASTY);  Surgeon: Dereck Leep, MD;  Location: [facility] ORS;  Service: Orthopedics;  Laterality: Left;   JOINT REPLACEMENT Left    THR   PORTACATH PLACEMENT Right 11/26/2018   Procedure: INSERTION PORT-A-CATH;  Surgeon: Jules Husbands, MD;  Location: [facility] ORS;  Service: General;  Laterality: Right;   TONSILLECTOMY      SOCIAL HISTORY: Social History   Socioeconomic History   Marital status: Married    Spouse name: Not on file   Number of children: Not on file   Years of education: Not on file   Highest education level: Not on file  Occupational History   Not on file  Tobacco Use   Smoking status: Never Smoker   Smokeless tobacco: Never Used  Substance and Sexual Activity   Alcohol use: No   Drug use: Never   Sexual activity:  Not on file  Other Topics Concern   Not on file  Social History Narrative   Not on file   Social Determinants of Health   Financial Resource Strain:    Difficulty of Paying Living Expenses: Not on file  Food Insecurity:    Worried About Running Out of Food in the Last Year: Not on file   Ran Out of Food in the Last Year: Not on file  Transportation Needs:    Lack of Transportation (Medical): Not on file   Lack of Transportation (Non-Medical): Not on file  Physical Activity:    Days of Exercise per Week: Not on file   Minutes of Exercise per Session: Not on file  Stress:    Feeling of Stress : Not on file  Social Connections:    Frequency of Communication with Friends and Family: Not on file   Frequency of Social Gatherings with Friends and Family: Not on file   Attends Religious Services: Not on file   Active Member of Clubs or Organizations: Not on file   Attends Archivist Meetings: Not on file   Marital Status: Not on file  Intimate Partner Violence:    Fear of Current or Ex-Partner: Not on file   Emotionally Abused: Not on file  Physically Abused: Not on file   Sexually Abused: Not on file    FAMILY HISTORY: Family History  Problem Relation Age of Onset   Kidney disease Father    Breast cancer Neg Hx     ALLERGIES:  has No Known Allergies.  MEDICATIONS:  Current Outpatient Medications  Medication Sig Dispense Refill   acetaminophen (TYLENOL) 500 MG tablet Take 500 mg by mouth every 6 (six) hours as needed.     amLODipine (NORVASC) 10 MG tablet Take 10 mg by mouth at bedtime.      docusate sodium (COLACE) 100 MG capsule Take 1 capsule (100 mg total) by mouth daily as needed for mild constipation or moderate constipation. Do not take if you have loose stools 60 capsule 2   gabapentin (NEURONTIN) 100 MG capsule Take 1 capsule (100 mg total) by mouth 3 (three) times daily. 90 capsule 0   lidocaine-prilocaine (EMLA) cream Apply  to affected area once 30 g 3   ondansetron (ZOFRAN) 8 MG tablet Take 1 tablet by mouth as needed.     oxyCODONE (ROXICODONE) 5 MG immediate release tablet Take 1 tablet (5 mg total) by mouth every 6 (six) hours as needed for moderate pain or severe pain. (Patient not taking: Reported on 03/01/2019) 60 tablet 0   potassium chloride (K-DUR) 10 MEQ tablet Take 1 tablet (10 mEq total) by mouth daily. (Patient taking differently: Take 10 mEq by mouth 2 (two) times daily. ) 30 tablet 0   prochlorperazine (COMPAZINE) 10 MG tablet Take 1 tablet by mouth as needed.     No current facility-administered medications for this visit.     PHYSICAL EXAMINATION: ECOG PERFORMANCE STATUS: 2 - Symptomatic, <50% confined to bed Vitals:   03/15/19 0835  BP: 136/83  Pulse: 96  Resp: 16  Temp: (!) 97.4 F (36.3 C)   Filed Weights   03/15/19 0835  Weight: 136 lb 11.2 oz (62 kg)    Physical Exam Constitutional:      General: She is not in acute distress.    Appearance: She is ill-appearing.     Comments: Frail, sits in wheelchair.  HENT:     Head: Normocephalic and atraumatic.     Comments: Alopecia Eyes:     General: No scleral icterus.    Pupils: Pupils are equal, round, and reactive to light.  Cardiovascular:     Rate and Rhythm: Normal rate and regular rhythm.     Heart sounds: Normal heart sounds.  Pulmonary:     Effort: Pulmonary effort is normal. No respiratory distress.     Breath sounds: Normal breath sounds. No wheezing.  Abdominal:     General: Bowel sounds are normal. There is no distension.     Palpations: Abdomen is soft. There is no mass.     Tenderness: There is no abdominal tenderness.  Musculoskeletal:        General: No deformity. Normal range of motion.     Cervical back: Normal range of motion and neck supple.  Skin:    General: Skin is warm and dry.     Coloration: Skin is not jaundiced.     Findings: No erythema or rash.  Neurological:     Mental Status: She is  alert and oriented to person, place, and time.     Cranial Nerves: No cranial nerve deficit.     Coordination: Coordination normal.  Psychiatric:        Behavior: Behavior normal.  Thought Content: Thought content normal.     LABORATORY DATA:  I have reviewed the data as listed Lab Results  Component Value Date   WBC 8.0 03/15/2019   HGB 11.8 (L) 03/15/2019   HCT 36.6 03/15/2019   MCV 107.3 (H) 03/15/2019   PLT 380 03/15/2019   Recent Labs    10/29/18 1318 02/08/19 0817 02/22/19 0820 03/01/19 0811  NA 135 138 138 140  K 3.0* 3.9 3.8 4.2  CL 104 103 104 105  CO2 21* '26 25 25  ' GLUCOSE 110* 168* 154* 114*  BUN '13 14 14 14  ' CREATININE 0.32* 0.69 0.56 0.70  CALCIUM 9.0 9.2 9.4 9.9  GFRNONAA >60 >60 >60 >60  GFRAA >60 >60 >60 >60  PROT 7.0 7.6 7.8 7.7  ALBUMIN 2.6* 3.6 3.9 3.6  AST 119* 37 32 52*  ALT 79* 25 28 33  ALKPHOS 399* 194* 413* 365*  BILITOT 18.2* 0.5 0.5 0.6  BILIDIR 10.2*  --   --   --    Iron/TIBC/Ferritin/ %Sat No results found for: IRON, TIBC, FERRITIN, IRONPCTSAT   RADIOGRAPHIC STUDIES: I have personally reviewed the radiological images as listed and agreed with the findings in the report. MR Lumbar Spine W Wo Contrast  Result Date: 03/10/2019 CLINICAL DATA:  Pancreatic cancer. Osseous metastases on CT. EXAM: MRI LUMBAR SPINE WITHOUT AND WITH CONTRAST TECHNIQUE: Multiplanar and multiecho pulse sequences of the lumbar spine were obtained without and with intravenous contrast. CONTRAST:  58m GADAVIST GADOBUTROL 1 MMOL/ML IV SOLN COMPARISON:  Abdominopelvic CT 02/15/2019. PET-CT 11/09/2018. FINDINGS: Segmentation: Conventional anatomy assumed, with the last open disc space designated L5-S1.Concordant with previous imaging. Alignment: Stable slight degenerative anterolisthesis at L3-4. Vertebrae: As seen on the recent CT, there is widespread osseous metastatic disease to the lumbar spine, sacrum and both iliac bones. Representative lesions on series 8  include a 2.2 cm lesion in the L2 vertebral body (image 12), a 2.4 cm lesion in the L4 vertebral body (image 26) and a 2.8 cm left iliac lesion on image 35. Stable mild superior endplate compression deformity at L1 without residual marrow edema. No evidence of acute pathologic fracture. These osseous metastases enhance following contrast. Conus medullaris: Extends to the L1 level and appears normal. No abnormal intradural enhancement or significant epidural tumor. Paraspinal and other soft tissues: There are multiple enlarged retroperitoneal lymph nodes consistent with nodal metastases. These are grossly stable compared with the recent CT, measuring up to 1.6 cm short axis in the aortocaval space (image 10/8). Disc levels: No significant disc space findings at T12-L1 or L1-2. L2-3: Mild disc bulging eccentric to the left, facet and ligamentous hypertrophy. Borderline spinal stenosis with mild left foraminal narrowing. L3-4: Loss of disc height with annular disc bulging. There is moderate facet and ligamentous hypertrophy accounting for the grade 1 anterolisthesis. These factors contribute to severe spinal stenosis with moderate narrowing of both lateral recesses and mild left foraminal narrowing. L4-5: Relatively preserved disc height. Mild disc bulging with moderate facet and ligamentous hypertrophy. Mild spinal stenosis. No nerve root encroachment. L5-S1: Mild disc bulging with moderate asymmetric right-sided facet hypertrophy. No significant spinal stenosis or nerve root encroachment. IMPRESSION: 1. 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 No evidence of acute pathologic fracture. 2. Stable mild superior endplate compression deformity at L1. No residual marrow edema. 3. Stable retroperitoneal nodal metastases. 4. Severe multifactorial spinal stenosis at L3-4 with  moderate narrowing of both lateral recesses  and mild left foraminal narrowing. 5. Stable mild multifactorial spinal stenosis at L2-3 and L4-5. Electronically Signed   By: Richardean Sale M.D.   On: 03/10/2019 20:13   CT Abdomen Pelvis W Contrast  Result Date: 02/15/2019 CLINICAL DATA:  Pancreatic adenocarcinoma. EXAM: CT ABDOMEN AND PELVIS WITH CONTRAST TECHNIQUE: Multidetector CT imaging of the abdomen and pelvis was performed using the standard protocol following bolus administration of intravenous contrast. CONTRAST:  68m OMNIPAQUE IOHEXOL 300 MG/ML  SOLN COMPARISON:  10/20/2018 FINDINGS: Lower chest: Unremarkable. Hepatobiliary: 8 mm hypodensity in the inferior right liver adjacent to the gallbladder (25/2) is new in the interval. 9 mm hypodensity in the tip of the left liver is stable and likely a cyst. Another 5 mm hypodensity in the dome of the liver is stable. Multiple gallstones evident with gas visible in the gallbladder lumen. Intrahepatic biliary duct dilatation seen previously has decreased with common bile duct stent visualized in situ. Pancreas: Pancreatic parenchymal atrophy noted with dilatation of the main duct in the body and tail of pancreas. No discrete pancreatic mass lesion evident although an ill-defined area of hypoenhancement in the head of the pancreas measures approximately 3 x 2.6 cm today. Spleen: No splenomegaly. No focal mass lesion. Adrenals/Urinary Tract: No adrenal nodule or mass. Kidneys unremarkable. No evidence for hydroureter. The urinary bladder appears normal for the degree of distention. Stomach/Bowel: Tiny hiatal hernia. Stomach otherwise unremarkable. Duodenum is normally positioned as is the ligament of Treitz. No small bowel wall thickening. No small bowel dilatation. The terminal ileum is normal. The appendix is normal. No gross colonic mass. No colonic wall thickening. Diverticular changes are noted in the left colon without evidence of diverticulitis. Vascular/Lymphatic: There is abdominal aortic  atherosclerosis without aneurysm. Portal vein, superior mesenteric vein, and splenic vein are patent. Celiac axis and SMA are patent. Mild lymphadenopathy in the gastrohepatic and hepato duodenal ligaments is not substantially changed. Index gastrohepatic ligament lymph node measures 9 mm short axis on 14/2. 15 mm retrocaval node on 21/2 was 18 mm short axis previously. Left para-aortic node measuring 1.4 cm short axis on 23/2 was 1.4 cm previously. No pelvic sidewall lymphadenopathy. Reproductive: The uterus is unremarkable.  There is no adnexal mass. Other: Trace free fluid noted in the cul-de-sac. Musculoskeletal: Status post left total hip replacement. Interval development of multiple lytic and sclerotic bone lesions consistent with metastatic involvement. 2.8 cm lesion identified left iliac bone on 47/2. New 13 mm lytic and sclerotic lesion in the left L2 vertebral body noted on 24/2. 14 mm sclerotic lesion in the sacrum visible on 51/2. IMPRESSION: 1. Interval development of multiple lytic and sclerotic bone lesions consistent with bony metastatic involvement. 2. Decrease in intrahepatic biliary duct dilatation with common bile duct stent visualized in situ. 3. Ill-defined hypoenhancing pancreatic head mass measuring 3.0 x 2.6 cm today. 4. New 8 mm hypodensity in the inferior right liver adjacent to the gallbladder. Attention on follow-up recommended. 5. Upper abdominal lymphadenopathy shows no substantial change. 6.  Aortic Atherosclerois (ICD10-170.0) Electronically Signed   By: EMisty StanleyM.D.   On: 02/15/2019 10:04   DG C-Arm 1-60 Min-No Report  Result Date: 01/28/2019 Fluoroscopy was utilized by the requesting physician.  No radiographic interpretation.    [facility] CA 19-9 was check on 10/21/2018, level was elevated at 1500.    ASSESSMENT & PLAN:  1. Malignant neoplasm of head of pancreas ([place]   2. Weight loss  3. Encounter for antineoplastic chemotherapy   4. Neuropathy     5. 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 Neoplasm related pain   . # Stage IV pancreatic cancer- Labs are reviewed and discussed with patient CA19.9 1500--> 2101-->1614-->  897-->940--> 1972 Lab results were reviewed and discussed with patient. Her counts are stable and acceptable to proceed with cycle 6 gemcitabine and Abraxane. CA 19-9 has trended up.  Repeat level today is still pending.  #Worsening of bone metastasis. MRI showed widespread osseous metastatic disease to the lumbar spine, sacrum, both iliac bones.  No cord compression.  Low evidence of acute pathologic fracture.  Stable retroperitoneal nodal metastasis.  Stable mild endplate compression deformity at L1.  Severe multifactorial spinal stenosis and moderate narrowing of both lateral recesses and mild left foraminal narrowing.  Stable mild multifactorial spinal stenosis at L2-3 and L4-5. Patient has obtain dental clearance.  We will arrange patient to start Zometa. Rationale of Zometa and potential side effects of Zometa were discussed in details with patient.  She voices understanding and is willing to proceed with Zometa. Vies patient to take calcium and vitamin D supplementation.   #Intermittent back pain, I refilled the patient's oxycodone.  Prescription sent to pharmacy.  #Grade 2 neuropathy, Abraxane has been decreased to 85 mg/m. Continue gabapentin 100 mg 3 times daily.  Weight loss, refer to dietitian. Palliative care following . #Discussed about genetic testing.  Patient has been referred to genetic testing.   All questions were answered. The patient knows to call the clinic with any problems questions or concerns.  cc Adin Hector, MD   Return of visit: 1 week We spent sufficient time to discuss many aspect of care, questions were answered to patient's satisfaction.  Earlie Server, MD, PhD Hematology Oncology [place] at [facility] 03/15/2019

## 2019-03-15 NOTE — Progress Notes (Signed)
[place]  Telephone:([phone] [phone]   Name: Melissa [name] Date: 03/15/2019 MRN: [id number]  DOB: 1940-05-19  Patient Care Team: Adin Hector, MD as PCP - General (Internal Medicine) Clent Jacks, RN as Oncology Nurse Navigator    REASON FOR CONSULTATION: Palliative Care consult requested for this 78 y.o. female with multiple medical problems including stage IV pancreatic cancer.  Patient was admitted 10/29/2018-10/31/2018 for ERCP and biliary stenting secondary to obstructive jaundice.  PET scan revealed hypermetabolic activity to retroperitoneal periaortic lymph node, sclerotic lesion of the right proximal femur and left sacrum.  Patient was referred to palliative care to help address goals and manage ongoing symptoms.   SOCIAL HISTORY:     reports that she has never smoked. She has never used smokeless tobacco. She reports that she does not drink alcohol or use drugs.   Patient is married and lives at home with her husband.  She was the caregiver for her 44 year old mother but recently had to move her into an ALF and mother subsequently died 6 Melissa later.  Patient has a daughter who lives next door.  She had another daughter who died at age 80 from complications related to spina bifida.  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  ADVANCE DIRECTIVES:  Not on file.  Daughter is reportedly her healthcare power of attorney.  Patient also has a living will.  CODE STATUS: DNR  PAST MEDICAL HISTORY: Past Medical History:  Diagnosis Date   Glaucoma    Hypertension    Osteopenia    Stroke ([place])     PAST SURGICAL HISTORY:  Past Surgical History:  Procedure Laterality Date   BREAST BIOPSY Right    core- stereo- neg   ERCP N/A 10/29/2018   Procedure: ENDOSCOPIC RETROGRADE CHOLANGIOPANCREATOGRAPHY (ERCP);  Surgeon: [name], MD;  Location: [facility] ENDOSCOPY;  Service:  Endoscopy;  Laterality: N/A;   ERCP N/A 01/28/2019   Procedure: ENDOSCOPIC RETROGRADE CHOLANGIOPANCREATOGRAPHY (ERCP) STENT REMOVAL;  Surgeon: [name], MD;  Location: [facility] ENDOSCOPY;  Service: Endoscopy;  Laterality: N/A;   EUS N/A 11/18/2018   Procedure: FULL UPPER ENDOSCOPIC ULTRASOUND (EUS) RADIAL;  Surgeon: Holly Bodily, MD;  Location: [facility] ENDOSCOPY;  Service: Gastroenterology;  Laterality: N/A;   HIP ARTHROPLASTY Left 01/02/2016   Procedure: ARTHROPLASTY BIPOLAR HIP (HEMIARTHROPLASTY);  Surgeon: Dereck Leep, MD;  Location: [facility] ORS;  Service: Orthopedics;  Laterality: Left;   JOINT REPLACEMENT Left    THR   PORTACATH PLACEMENT Right 11/26/2018   Procedure: INSERTION PORT-A-CATH;  Surgeon: Jules Husbands, MD;  Location: [facility] ORS;  Service: General;  Laterality: Right;   TONSILLECTOMY      HEMATOLOGY/ONCOLOGY HISTORY:  Oncology History Overview Note  DAYSI [name] is a  78 y.o.  female with PMH listed below was seen in consultation at the request of  Adin Hector, MD  for evaluation of abnormal pancreatic disease. Patient was recently seen by primary care provider Dr. Caryl Comes for evaluation of nausea, diarrhea and jaundice.  Blood work on 10/19/2018 showed potassium 2.9, bilirubin 15, alkaline phosphatase 478, AST 114, ALT 95. Abdomen pelvis CT scan on 10/20/2018 showed moderate to market intra-and extra hepatic biliary dilation with mild diffuse dilatation of the pancreatic duct. Patient has had poor appetite and has lost a 5 to 10 pounds recently. She also had diarrhea.  She noticed sand like stool for 1- 2 weeks.  She takes care of  her 72 year old mother and recently feels she is not able to take care of her anymore due to progressively worsening weakness and fatigue.  She placed her mother to assisted living yesterday. She has had discussion with Dr. Caryl Comes about her blood work and CT scans.  She understands that there is a strong suspicion of cancer.  Nonessential  medication has been stopped. CA 19-9 was check on 10/21/2018, level was elevated at 1500.   # Patient was admitted from 10/29/2018-10/31/2018.  Status post ERCP and biliary stenting.  Patient had duodenum ampulla biopsy showed nondiagnostic.  Negative for invasive carcinoma. Bilirubin trended down to 11 at the time of discharge on 10/31/2018.   #PET scan images were independent reviewed by me and discussed with patient. cTxN2 M1a Patient has at least M1 a disease due to retroperitoneal/peri-aortic lymph node involvement,  sclerotic lesion of right proximal femur with mild hypermetabolic activity, as well as left sacrum   # EUS biopsy of pancreatic mass showed positive for malignancy, Adenocarcinoma. There is not enough tissue for NGS testing.  Attempt to at MMR, still not enough tissue to complete the testing   Malignant neoplasm of head of pancreas ([place])  11/02/2018 Initial Diagnosis   Malignant neoplasm of head of pancreas [facility])    Chemotherapy   The patient had PACLitaxel-protein bound (ABRAXANE) chemo infusion 200 mg, 115 mg/m2 = 225 mg, Intravenous,  Once, 1 of 5 cycles  Administration: 200 mg (11/30/2018)    gemcitabine (GEMZAR) 1,600 mg in sodium chloride 0.9 % 250 mL chemo infusion, 1,710 mg, Intravenous,  Once, 1 of 5 cycles  Administration: 1,600 mg (11/30/2018)  for chemotherapy treatment.       ALLERGIES:  has No Known Allergies.  MEDICATIONS:  Current Outpatient Medications  Medication Sig Dispense Refill   acetaminophen (TYLENOL) 500 MG tablet Take 500 mg by mouth every 6 (six) hours as needed.     amLODipine (NORVASC) 10 MG tablet Take 10 mg by mouth at bedtime.      docusate sodium (COLACE) 100 MG capsule Take 1 capsule (100 mg total) by mouth daily as needed for mild constipation or moderate constipation. Do not take if you have loose stools 60 capsule 2   gabapentin (NEURONTIN) 100 MG capsule Take 1 capsule (100 mg total) by mouth 3 (three) times daily. 90 capsule  0   lidocaine-prilocaine (EMLA) cream Apply to affected area once 30 g 3   ondansetron (ZOFRAN) 8 MG tablet Take 1 tablet by mouth as needed.     oxyCODONE (ROXICODONE) 5 MG immediate release tablet Take 1 tablet (5 mg total) by mouth every 6 (six) hours as needed for moderate pain or severe pain. 60 tablet 0   potassium chloride (K-DUR) 10 MEQ tablet Take 1 tablet (10 mEq total) by mouth daily. (Patient taking differently: Take 10 mEq by mouth 2 (two) times daily. ) 30 tablet 0   prochlorperazine (COMPAZINE) 10 MG tablet Take 1 tablet by mouth as needed.     No current facility-administered medications for this visit.    VITAL SIGNS: There were no vitals taken for this visit. There were no vitals filed for this visit.  Estimated body mass index is 26.7 kg/m as calculated from the following:   Height as of 01/28/19: 5' (1.524 m).   Weight as of an earlier encounter on 03/15/19: 136 lb 11.2 oz (62 kg).  LABS: CBC:    Component Value Date/Time   WBC 8.0 03/15/2019 0817   HGB 11.8 (L)  03/15/2019 0817   HCT 36.6 03/15/2019 0817   PLT 380 03/15/2019 0817   MCV 107.3 (H) 03/15/2019 0817   NEUTROABS 5.9 03/15/2019 0817   LYMPHSABS 0.8 03/15/2019 0817   MONOABS 1.2 (H) 03/15/2019 0817   EOSABS 0.1 03/15/2019 0817   BASOSABS 0.0 03/15/2019 0817   Comprehensive Metabolic Panel:    Component Value Date/Time   NA 137 03/15/2019 0817   K 3.5 03/15/2019 0817   CL 102 03/15/2019 0817   CO2 24 03/15/2019 0817   BUN 11 03/15/2019 0817   CREATININE 0.62 03/15/2019 0817   GLUCOSE 171 (H) 03/15/2019 0817   CALCIUM 9.3 03/15/2019 0817   AST 29 03/15/2019 0817   ALT 19 03/15/2019 0817   ALKPHOS 535 (H) 03/15/2019 0817   BILITOT 0.6 03/15/2019 0817   PROT 8.1 03/15/2019 0817   ALBUMIN 3.5 03/15/2019 0817    RADIOGRAPHIC STUDIES: MR Lumbar Spine W Wo Contrast  Result Date: 03/10/2019 CLINICAL DATA:  Pancreatic cancer. Osseous metastases on CT. EXAM: MRI LUMBAR SPINE WITHOUT AND  WITH CONTRAST TECHNIQUE: Multiplanar and multiecho pulse sequences of the lumbar spine were obtained without and with intravenous contrast. CONTRAST:  32m GADAVIST GADOBUTROL 1 MMOL/ML IV SOLN COMPARISON:  Abdominopelvic CT 02/15/2019. PET-CT 11/09/2018. FINDINGS: Segmentation: Conventional anatomy assumed, with the last open disc space designated L5-S1.Concordant with previous imaging. Alignment: Stable slight degenerative anterolisthesis at L3-4. Vertebrae: As seen on the recent CT, there is widespread osseous metastatic disease to the lumbar spine, sacrum and both iliac bones. Representative lesions on series 8 include a 2.2 cm lesion in the L2 vertebral body (image 12), a 2.4 cm lesion in the L4 vertebral body (image 26) and a 2.8 cm left iliac lesion on image 35. Stable mild superior endplate compression deformity at L1 without residual marrow edema. No evidence of acute pathologic fracture. These osseous metastases enhance following contrast. Conus medullaris: Extends to the L1 level and appears normal. No abnormal intradural enhancement or significant epidural tumor. Paraspinal and other soft tissues: There are multiple enlarged retroperitoneal lymph nodes consistent with nodal metastases. These are grossly stable compared with the recent CT, measuring up to 1.6 cm short axis in the aortocaval space (image 10/8). Disc levels: No significant disc space findings at T12-L1 or L1-2. L2-3: Mild disc bulging eccentric to the left, facet and ligamentous hypertrophy. Borderline spinal stenosis with mild left foraminal narrowing. L3-4: Loss of disc height with annular disc bulging. There is moderate facet and ligamentous hypertrophy accounting for the grade 1 anterolisthesis. These factors contribute to severe spinal stenosis with moderate narrowing of both lateral recesses and mild left foraminal narrowing. L4-5: Relatively preserved disc height. Mild disc bulging with moderate facet and ligamentous hypertrophy. Mild  spinal stenosis. No nerve root encroachment. L5-S1: Mild disc bulging with moderate asymmetric right-sided facet hypertrophy. No significant spinal stenosis or nerve root encroachment. IMPRESSION: 1. 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 No evidence of acute pathologic fracture. 2. Stable mild superior endplate compression deformity at L1. No residual marrow edema. 3. Stable retroperitoneal nodal metastases. 4. Severe multifactorial spinal stenosis at L3-4 with moderate narrowing of both lateral recesses and mild left foraminal narrowing. 5. Stable mild multifactorial spinal stenosis at L2-3 and L4-5. Electronically Signed   By: WRichardean SaleM.D.   On: 03/10/2019 20:13   CT Abdomen Pelvis W Contrast  Result Date: 02/15/2019 CLINICAL DATA:  Pancreatic adenocarcinoma. EXAM: CT  ABDOMEN AND PELVIS WITH CONTRAST TECHNIQUE: Multidetector CT imaging of the abdomen and pelvis was performed using the standard protocol following bolus administration of intravenous contrast. CONTRAST:  27m OMNIPAQUE IOHEXOL 300 MG/ML  SOLN COMPARISON:  10/20/2018 FINDINGS: Lower chest: Unremarkable. Hepatobiliary: 8 mm hypodensity in the inferior right liver adjacent to the gallbladder (25/2) is new in the interval. 9 mm hypodensity in the tip of the left liver is stable and likely a cyst. Another 5 mm hypodensity in the dome of the liver is stable. Multiple gallstones evident with gas visible in the gallbladder lumen. Intrahepatic biliary duct dilatation seen previously has decreased with common bile duct stent visualized in situ. Pancreas: Pancreatic parenchymal atrophy noted with dilatation of the main duct in the body and tail of pancreas. No discrete pancreatic mass lesion evident although an ill-defined area of hypoenhancement in the head of the pancreas measures approximately 3 x 2.6 cm today. Spleen: No splenomegaly. No  focal mass lesion. Adrenals/Urinary Tract: No adrenal nodule or mass. Kidneys unremarkable. No evidence for hydroureter. The urinary bladder appears normal for the degree of distention. Stomach/Bowel: Tiny hiatal hernia. Stomach otherwise unremarkable. Duodenum is normally positioned as is the ligament of Treitz. No small bowel wall thickening. No small bowel dilatation. The terminal ileum is normal. The appendix is normal. No gross colonic mass. No colonic wall thickening. Diverticular changes are noted in the left colon without evidence of diverticulitis. Vascular/Lymphatic: There is abdominal aortic atherosclerosis without aneurysm. Portal vein, superior mesenteric vein, and splenic vein are patent. Celiac axis and SMA are patent. Mild lymphadenopathy in the gastrohepatic and hepato duodenal ligaments is not substantially changed. Index gastrohepatic ligament lymph node measures 9 mm short axis on 14/2. 15 mm retrocaval node on 21/2 was 18 mm short axis previously. Left para-aortic node measuring 1.4 cm short axis on 23/2 was 1.4 cm previously. No pelvic sidewall lymphadenopathy. Reproductive: The uterus is unremarkable.  There is no adnexal mass. Other: Trace free fluid noted in the cul-de-sac. Musculoskeletal: Status post left total hip replacement. Interval development of multiple lytic and sclerotic bone lesions consistent with metastatic involvement. 2.8 cm lesion identified left iliac bone on 47/2. New 13 mm lytic and sclerotic lesion in the left L2 vertebral body noted on 24/2. 14 mm sclerotic lesion in the sacrum visible on 51/2. IMPRESSION: 1. Interval development of multiple lytic and sclerotic bone lesions consistent with bony metastatic involvement. 2. Decrease in intrahepatic biliary duct dilatation with common bile duct stent visualized in situ. 3. Ill-defined hypoenhancing pancreatic head mass measuring 3.0 x 2.6 cm today. 4. New 8 mm hypodensity in the inferior right liver adjacent to the  gallbladder. Attention on follow-up recommended. 5. Upper abdominal lymphadenopathy shows no substantial change. 6.  Aortic Atherosclerois (ICD10-170.0) Electronically Signed   By: EMisty StanleyM.D.   On: 02/15/2019 10:04    PERFORMANCE STATUS (ECOG) : 2 - Symptomatic, <50% confined to bed  Review of Systems Unless otherwise noted, a complete review of systems is negative.  Physical Exam General: NAD, frail appearing, thin Pulmonary: Unlabored Extremities: no edema, no joint deformities Skin: no rashes Neurological: Weakness but otherwise nonfocal  IMPRESSION: Routine follow-up visit made today.  Patient was seen in the infusion room.  Patient feels some improved this week but says that last week she was having worsening weakness.  [place]  Discussed fall prevention strategies.  She feels like her appetite is a little better.  However, her weight is down  approximately 6 pounds in the past 2 weeks.  I suggested that she increase her oral supplements to 3 times daily from twice daily.  I suggested a trial of mirtazapine as patient is also having some difficulty sleeping but she declined starting a new medication.  We will have dietitian follow-up.  PLAN: -Continue current scope of treatment -increase oral supplements to 3 times daily -Would suggest trial of mirtazapine if patient will allow -RD to follow -Follow-up telephone visit in 4 weeks   Patient expressed understanding and was in agreement with this plan. She also understands that She can call the clinic at any time with any questions, concerns, or complaints.     Time Total: 15 minutes  Visit consisted of counseling and education dealing with the complex and emotionally intense issues of symptom management and palliative care in the setting of serious and potentially life-threatening illness.Greater than 50%  of this time was spent counseling and coordinating care related to the above  assessment and plan.  Signed by: Altha Harm, PhD, NP-C [phone] (Work Cell)

## 2019-03-16 LAB — CANCER ANTIGEN 19-9: CA 19-9: 3374 U/mL — ABNORMAL HIGH (ref 0–35)

## 2019-03-21 ENCOUNTER — Other Ambulatory Visit: Payer: Self-pay

## 2019-03-21 NOTE — Progress Notes (Signed)
Patient pre screened for office appointment, no questions or concerns today. Patient reminded of upcoming appointment time and date. 

## 2019-03-22 ENCOUNTER — Inpatient Hospital Stay: Payer: Medicare Other | Attending: Oncology

## 2019-03-22 ENCOUNTER — Inpatient Hospital Stay: Payer: Medicare Other

## 2019-03-22 ENCOUNTER — Other Ambulatory Visit: Payer: Self-pay | Admitting: Oncology

## 2019-03-22 ENCOUNTER — Ambulatory Visit: Payer: Medicare Other | Admitting: Oncology

## 2019-03-22 ENCOUNTER — Other Ambulatory Visit: Payer: Medicare Other

## 2019-03-22 ENCOUNTER — Telehealth: Payer: Self-pay

## 2019-03-22 ENCOUNTER — Other Ambulatory Visit: Payer: Self-pay

## 2019-03-22 ENCOUNTER — Inpatient Hospital Stay (HOSPITAL_BASED_OUTPATIENT_CLINIC_OR_DEPARTMENT_OTHER): Payer: Medicare Other | Admitting: Oncology

## 2019-03-22 ENCOUNTER — Encounter: Payer: Self-pay | Admitting: Oncology

## 2019-03-22 VITALS — BP 125/79 | HR 81 | Temp 96.3°F | Resp 18 | Wt 139.1 lb

## 2019-03-22 DIAGNOSIS — R079 Chest pain, unspecified: Secondary | ICD-10-CM | POA: Insufficient documentation

## 2019-03-22 DIAGNOSIS — G629 Polyneuropathy, unspecified: Secondary | ICD-10-CM | POA: Diagnosis not present

## 2019-03-22 DIAGNOSIS — D649 Anemia, unspecified: Secondary | ICD-10-CM | POA: Insufficient documentation

## 2019-03-22 DIAGNOSIS — C7951 Secondary malignant neoplasm of bone: Secondary | ICD-10-CM | POA: Diagnosis not present

## 2019-03-22 DIAGNOSIS — C250 Malignant neoplasm of head of pancreas: Secondary | ICD-10-CM

## 2019-03-22 DIAGNOSIS — C772 Secondary and unspecified malignant neoplasm of intra-abdominal lymph nodes: Secondary | ICD-10-CM | POA: Insufficient documentation

## 2019-03-22 DIAGNOSIS — D490 Neoplasm of unspecified behavior of digestive system: Secondary | ICD-10-CM

## 2019-03-22 DIAGNOSIS — Z95828 Presence of other vascular implants and grafts: Secondary | ICD-10-CM

## 2019-03-22 DIAGNOSIS — R197 Diarrhea, unspecified: Secondary | ICD-10-CM | POA: Diagnosis not present

## 2019-03-22 DIAGNOSIS — R5382 Chronic fatigue, unspecified: Secondary | ICD-10-CM | POA: Insufficient documentation

## 2019-03-22 DIAGNOSIS — Z5111 Encounter for antineoplastic chemotherapy: Secondary | ICD-10-CM | POA: Diagnosis present

## 2019-03-22 DIAGNOSIS — Z515 Encounter for palliative care: Secondary | ICD-10-CM | POA: Insufficient documentation

## 2019-03-22 DIAGNOSIS — R634 Abnormal weight loss: Secondary | ICD-10-CM

## 2019-03-22 DIAGNOSIS — E876 Hypokalemia: Secondary | ICD-10-CM

## 2019-03-22 DIAGNOSIS — Z66 Do not resuscitate: Secondary | ICD-10-CM | POA: Diagnosis not present

## 2019-03-22 LAB — COMPREHENSIVE METABOLIC PANEL
ALT: 23 U/L (ref 0–44)
AST: 41 U/L (ref 15–41)
Albumin: 3.3 g/dL — ABNORMAL LOW (ref 3.5–5.0)
Alkaline Phosphatase: 382 U/L — ABNORMAL HIGH (ref 38–126)
Anion gap: 9 (ref 5–15)
BUN: 13 mg/dL (ref 8–23)
CO2: 25 mmol/L (ref 22–32)
Calcium: 9.6 mg/dL (ref 8.9–10.3)
Chloride: 104 mmol/L (ref 98–111)
Creatinine, Ser: 0.63 mg/dL (ref 0.44–1.00)
GFR calc Af Amer: >60 mL/min (ref >60)
GFR calc non Af Amer: >60 mL/min (ref >60)
Glucose, Bld: 124 mg/dL — ABNORMAL HIGH (ref 70–99)
Potassium: 4.1 mmol/L (ref 3.5–5.1)
Sodium: 138 mmol/L (ref 135–145)
Total Bilirubin: 0.5 mg/dL (ref 0.3–1.2)
Total Protein: 7.5 g/dL (ref 6.5–8.1)

## 2019-03-22 LAB — CBC WITH DIFFERENTIAL/PLATELET
Abs Immature Granulocytes: 0.62 10*3/uL — ABNORMAL HIGH (ref 0.00–0.07)
Basophils Absolute: 0.1 10*3/uL (ref 0.0–0.1)
Basophils Relative: 1 %
Eosinophils Absolute: 0.1 10*3/uL (ref 0.0–0.5)
Eosinophils Relative: 1 %
HCT: 34 % — ABNORMAL LOW (ref 36.0–46.0)
Hemoglobin: 11 g/dL — ABNORMAL LOW (ref 12.0–15.0)
Immature Granulocytes: 8 %
Lymphocytes Relative: 9 %
Lymphs Abs: 0.7 10*3/uL (ref 0.7–4.0)
MCH: 34.9 pg — ABNORMAL HIGH (ref 26.0–34.0)
MCHC: 32.4 g/dL (ref 30.0–36.0)
MCV: 107.9 fL — ABNORMAL HIGH (ref 80.0–100.0)
Monocytes Absolute: 1.2 10*3/uL — ABNORMAL HIGH (ref 0.1–1.0)
Monocytes Relative: 15 %
Neutro Abs: 5.4 10*3/uL (ref 1.7–7.7)
Neutrophils Relative %: 66 %
Platelets: 243 10*3/uL (ref 150–400)
RBC: 3.15 MIL/uL — ABNORMAL LOW (ref 3.87–5.11)
RDW: 15.2 % (ref 11.5–15.5)
WBC: 8 10*3/uL (ref 4.0–10.5)
nRBC: 0.6 % — ABNORMAL HIGH (ref 0.0–0.2)

## 2019-03-22 MED ORDER — ZOLEDRONIC ACID 4 MG/5ML IV CONC
3.3000 mg | Freq: Once | INTRAVENOUS | Status: AC
  Administered 2019-03-22: 13:00:00 3.3 mg via INTRAVENOUS
  Filled 2019-03-22: qty 4.13

## 2019-03-22 MED ORDER — SODIUM CHLORIDE 0.9% FLUSH
10.0000 mL | Freq: Once | INTRAVENOUS | Status: AC
  Administered 2019-03-22: 10 mL via INTRAVENOUS
  Filled 2019-03-22: qty 10

## 2019-03-22 MED ORDER — GABAPENTIN 300 MG PO CAPS: 300.0000 mg | ORAL_CAPSULE | Freq: Two times a day (BID) | ORAL | 0 refills | Status: DC

## 2019-03-22 MED ORDER — PACLITAXEL PROTEIN-BOUND CHEMO INJECTION 100 MG
100.0000 mg/m2 | Freq: Once | INTRAVENOUS | Status: AC
  Administered 2019-03-22: 13:00:00 150 mg via INTRAVENOUS
  Filled 2019-03-22: qty 30

## 2019-03-22 MED ORDER — HEPARIN SOD (PORK) LOCK FLUSH 100 UNIT/ML IV SOLN
500.0000 [IU] | Freq: Once | INTRAVENOUS | Status: AC | PRN
  Administered 2019-03-22: 15:00:00 500 [IU]
  Filled 2019-03-22: qty 5

## 2019-03-22 MED ORDER — SODIUM CHLORIDE 0.9 % IV SOLN
1600.0000 mg | Freq: Once | INTRAVENOUS | Status: AC
  Administered 2019-03-22: 14:00:00 1600 mg via INTRAVENOUS
  Filled 2019-03-22: qty 26.3

## 2019-03-22 MED ORDER — SODIUM CHLORIDE 0.9 % IV SOLN
Freq: Once | INTRAVENOUS | Status: AC
  Filled 2019-03-22: qty 250

## 2019-03-22 MED ORDER — PROCHLORPERAZINE MALEATE 10 MG PO TABS
10.0000 mg | ORAL_TABLET | Freq: Once | ORAL | Status: AC
  Administered 2019-03-22: 12:00:00 10 mg via ORAL
  Filled 2019-03-22: qty 1

## 2019-03-22 MED ORDER — SODIUM CHLORIDE 0.9 % IV SOLN
Freq: Once | INTRAVENOUS | Status: DC
  Filled 2019-03-22: qty 250

## 2019-03-22 NOTE — Telephone Encounter (Signed)
[facility] liquid biopsy requested by Dr. Tasia Catchings. Collected blood, signed order, demographic sheet, copy of insurance card and path report sent to [facility] via [place].

## 2019-03-22 NOTE — Progress Notes (Signed)
Pt tolerated infusion well. Pt and VS stable at discharge.  

## 2019-03-22 NOTE — Progress Notes (Signed)
Hematology/Oncology follow up note [facility] Telephone:([phone] Fax:([phone]) [phone]   Patient Care Team: Adin Hector, MD as PCP - General (Internal Medicine) Clent Jacks, RN as Oncology Nurse Navigator  REFERRING PROVIDER: Adin Hector, MD  CHIEF COMPLAINTS/REASON FOR VISIT:  Follow-up for pancreatic cancer   HISTORY OF PRESENTING ILLNESS:   Melissa [name] is a  79 y.o.  female with PMH listed below was seen in consultation at the request of  Adin Hector, MD  for evaluation of abnormal pancreatic disease. Patient was recently seen by primary care provider Dr. Caryl Comes for evaluation of nausea, diarrhea and jaundice.  Blood work on 10/19/2018 showed potassium 2.9, bilirubin 15, alkaline phosphatase 478, AST 114, ALT 95. Abdomen pelvis CT scan on 10/20/2018 showed moderate to market intra-and extra hepatic biliary dilation with mild diffuse dilatation of the pancreatic duct. Patient has had poor appetite and has lost a 5 to 10 pounds recently. She also had diarrhea.  She noticed sand like stool for 1- 2 weeks.  She takes care of her 33 year old mother and recently feels she is not able to take care of her anymore due to progressively worsening weakness and fatigue.  She placed her mother to assisted living yesterday. She has had discussion with Dr. Caryl Comes about her blood work and CT scans.  She understands that there is a strong suspicion of cancer.  Nonessential medication has been stopped. CA 19-9 was check on 10/21/2018, level was elevated at 1500.  Today she denies any pain.  Itchy all over her body.. Per patient's request, patient's daughter [name] was [name] and [name] was able to hear entire clinical encounter conversation and participate in the reported history and discussion.  # Patient was admitted from 10/29/2018-10/31/2018.  Status post ERCP and biliary stenting.  Patient had duodenum ampulla biopsy showed nondiagnostic.  Negative for  invasive carcinoma. Bilirubin trended down to 11 at the time of discharge on 10/31/2018.  #PET scan images were independent reviewed by me and discussed with patient. cTxN2 M1a Patient has at least M1 a disease due to retroperitoneal/peri-aortic lymph node involvement,  sclerotic lesion of right proximal femur with mild hypermetabolic activity, as well as left sacrum  # EUS biopsy of pancreatic mass showed positive for malignancy, Adenocarcinoma. There is not enough tissue for NGS testing.  Attempt to at MMR, still not enough tissue to complete the testing.  INTERVAL HISTORY DAYSHIA [name] is a 79 y.o. female who has above history reviewed by me today presents for follow up visit for management of stage IV pancreatic cancer Patient has been on palliative chemotherapy with gemcitabine and Abraxane.   Patient reports feeling fatigued. Otherwise at baseline. Appetite has improved. She gained 3 pounds since last visit. She drinks Ensure supplements 2-3 times a day. Denies any nausea, vomiting, fever, chills, abdominal pain. Back pain is improved on current pain regimen with oxycodone 5 mg every 6 hours as needed for pain. 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 She feels the symptoms are stable.     Review of Systems  Constitutional: Positive for fatigue. Negative for appetite change, chills and fever.  HENT:   Negative for hearing loss and voice change.   Eyes: Negative for eye problems and icterus.  Respiratory: Negative for chest tightness and cough.   Cardiovascular: Negative for chest pain.  Gastrointestinal: Negative for abdominal distention, abdominal pain and blood in stool.  Endocrine: Negative for hot flashes.  Genitourinary: Negative for  difficulty urinating and frequency.   Musculoskeletal: Positive for back pain. Negative for arthralgias.  Skin: Negative for itching and rash.  Neurological: Positive for numbness. Negative for extremity weakness.  Hematological:  Negative for adenopathy.  Psychiatric/Behavioral: Negative for confusion.    MEDICAL HISTORY:  Past Medical History:  Diagnosis Date  . Glaucoma   . Hypertension   . Osteopenia   . Stroke [facility])     SURGICAL HISTORY: Past Surgical History:  Procedure Laterality Date  . BREAST BIOPSY Right    core- stereo- neg  . ERCP N/A 10/29/2018   Procedure: ENDOSCOPIC RETROGRADE CHOLANGIOPANCREATOGRAPHY (ERCP);  Surgeon: [name], MD;  Location: [facility] ENDOSCOPY;  Service: Endoscopy;  Laterality: N/A;  . ERCP N/A 01/28/2019   Procedure: ENDOSCOPIC RETROGRADE CHOLANGIOPANCREATOGRAPHY (ERCP) STENT REMOVAL;  Surgeon: [name], MD;  Location: [facility] ENDOSCOPY;  Service: Endoscopy;  Laterality: N/A;  . EUS N/A 11/18/2018   Procedure: FULL UPPER ENDOSCOPIC ULTRASOUND (EUS) RADIAL;  Surgeon: Holly Bodily, MD;  Location: [facility] ENDOSCOPY;  Service: Gastroenterology;  Laterality: N/A;  . HIP ARTHROPLASTY Left 01/02/2016   Procedure: ARTHROPLASTY BIPOLAR HIP (HEMIARTHROPLASTY);  Surgeon: Dereck Leep, MD;  Location: [facility] ORS;  Service: Orthopedics;  Laterality: Left;  . JOINT REPLACEMENT Left    THR  . PORTACATH PLACEMENT Right 11/26/2018   Procedure: INSERTION PORT-A-CATH;  Surgeon: Jules Husbands, MD;  Location: [facility] ORS;  Service: General;  Laterality: Right;  . TONSILLECTOMY      SOCIAL HISTORY: Social History   Socioeconomic History  . Marital status: Married    Spouse name: Not on file  . Number of children: Not on file  . Years of education: Not on file  . Highest education level: Not on file  Occupational History  . Not on file  Tobacco Use  . Smoking status: Never Smoker  . Smokeless tobacco: Never Used  Substance and Sexual Activity  . Alcohol use: No  . Drug use: Never  . Sexual activity: Not on file  Other Topics Concern  . Not on file  Social History Narrative  . Not on file   Social Determinants of Health   Financial Resource Strain:   . Difficulty of Paying  Living Expenses: Not on file  Food Insecurity:   . Worried About Charity fundraiser in the Last Year: Not on file  . Ran Out of Food in the Last Year: Not on file  Transportation Needs:   . Lack of Transportation (Medical): Not on file  . Lack of Transportation (Non-Medical): Not on file  Physical Activity:   . Days of Exercise per Week: Not on file  . Minutes of Exercise per Session: Not on file  Stress:   . Feeling of Stress : Not on file  Social Connections:   . Frequency of Communication with Friends and Family: Not on file  . Frequency of Social Gatherings with Friends and Family: Not on file  . Attends Religious Services: Not on file  . Active Member of Clubs or Organizations: Not on file  . Attends Archivist Meetings: Not on file  . Marital Status: Not on file  Intimate Partner Violence:   . Fear of Current or Ex-Partner: Not on file  . Emotionally Abused: Not on file  . Physically Abused: Not on file  . Sexually Abused: Not on file    FAMILY HISTORY: Family History  Problem Relation Age of Onset  . Kidney disease Father   . Breast cancer Neg Hx  ALLERGIES:  has No Known Allergies.  MEDICATIONS:  Current Outpatient Medications  Medication Sig Dispense Refill  . acetaminophen (TYLENOL) 500 MG tablet Take 500 mg by mouth every 6 (six) hours as needed.    Marland Kitchen amLODipine (NORVASC) 10 MG tablet Take 10 mg by mouth at bedtime.     . docusate sodium (COLACE) 100 MG capsule Take 1 capsule (100 mg total) by mouth daily as needed for mild constipation or moderate constipation. Do not take if you have loose stools 60 capsule 2  . lidocaine-prilocaine (EMLA) cream Apply to affected area once 30 g 3  . ondansetron (ZOFRAN) 8 MG tablet Take 1 tablet by mouth as needed.    Marland Kitchen oxyCODONE (ROXICODONE) 5 MG immediate release tablet Take 1 tablet (5 mg total) by mouth every 6 (six) hours as needed for moderate pain or severe pain. 60 tablet 0  . potassium chloride (K-DUR)  10 MEQ tablet Take 1 tablet (10 mEq total) by mouth daily. (Patient taking differently: Take 10 mEq by mouth 2 (two) times daily. ) 30 tablet 0  . prochlorperazine (COMPAZINE) 10 MG tablet Take 1 tablet by mouth as needed.    . gabapentin (NEURONTIN) 300 MG capsule Take 1 capsule (300 mg total) by mouth 2 (two) times daily. 60 capsule 0   No current facility-administered medications for this visit.     PHYSICAL EXAMINATION: ECOG PERFORMANCE STATUS: 2 - Symptomatic, <50% confined to bed Vitals:   03/22/19 1133  BP: 125/79  Pulse: 81  Resp: 18  Temp: (!) 96.3 F (35.7 C)   Filed Weights   03/22/19 1133  Weight: 139 lb 1.6 oz (63.1 kg)    Physical Exam Constitutional:      General: She is not in acute distress.    Appearance: She is ill-appearing.     Comments: Frail, sits in wheelchair.  HENT:     Head: Normocephalic and atraumatic.     Comments: Alopecia Eyes:     General: No scleral icterus.    Pupils: Pupils are equal, round, and reactive to light.  Cardiovascular:     Rate and Rhythm: Normal rate and regular rhythm.     Heart sounds: Normal heart sounds.  Pulmonary:     Effort: Pulmonary effort is normal. No respiratory distress.     Breath sounds: Normal breath sounds. No wheezing.  Abdominal:     General: Bowel sounds are normal. There is no distension.     Palpations: Abdomen is soft. There is no mass.     Tenderness: There is no abdominal tenderness.  Musculoskeletal:        General: No deformity. Normal range of motion.     Cervical back: Normal range of motion and neck supple.  Skin:    General: Skin is warm and dry.     Coloration: Skin is not jaundiced.     Findings: No erythema or rash.  Neurological:     Mental Status: She is alert and oriented to person, place, and time.     Cranial Nerves: No cranial nerve deficit.     Coordination: Coordination normal.  Psychiatric:        Behavior: Behavior normal.        Thought Content: Thought content  normal.     LABORATORY DATA:  I have reviewed the data as listed Lab Results  Component Value Date   WBC 8.0 03/22/2019   HGB 11.0 (L) 03/22/2019   HCT 34.0 (L) 03/22/2019   MCV 107.9 (  H) 03/22/2019   PLT 243 03/22/2019   Recent Labs    10/29/18 1318 03/01/19 0811 03/15/19 0817 03/22/19 1107  NA 135 140 137 138  K 3.0* 4.2 3.5 4.1  CL 104 105 102 104  CO2 21* '25 24 25  ' GLUCOSE 110* 114* 171* 124*  BUN '13 14 11 13  ' CREATININE 0.32* 0.70 0.62 0.63  CALCIUM 9.0 9.9 9.3 9.6  GFRNONAA >60 >60 >60 >60  GFRAA >60 >60 >60 >60  PROT 7.0 7.7 8.1 7.5  ALBUMIN 2.6* 3.6 3.5 3.3*  AST 119* 52* 29 41  ALT 79* 33 19 23  ALKPHOS 399* 365* 535* 382*  BILITOT 18.2* 0.6 0.6 0.5  BILIDIR 10.2*  --   --   --    Iron/TIBC/Ferritin/ %Sat No results found for: IRON, TIBC, FERRITIN, IRONPCTSAT   RADIOGRAPHIC STUDIES: I have personally reviewed the radiological images as listed and agreed with the findings in the report. MR Lumbar Spine W Wo Contrast  Result Date: 03/10/2019 CLINICAL DATA:  Pancreatic cancer. Osseous metastases on CT. EXAM: MRI LUMBAR SPINE WITHOUT AND WITH CONTRAST TECHNIQUE: Multiplanar and multiecho pulse sequences of the lumbar spine were obtained without and with intravenous contrast. CONTRAST:  68m GADAVIST GADOBUTROL 1 MMOL/ML IV SOLN COMPARISON:  Abdominopelvic CT 02/15/2019. PET-CT 11/09/2018. FINDINGS: Segmentation: Conventional anatomy assumed, with the last open disc space designated L5-S1.Concordant with previous imaging. Alignment: Stable slight degenerative anterolisthesis at L3-4. Vertebrae: As seen on the recent CT, there is widespread osseous metastatic disease to the lumbar spine, sacrum and both iliac bones. Representative lesions on series 8 include a 2.2 cm lesion in the L2 vertebral body (image 12), a 2.4 cm lesion in the L4 vertebral body (image 26) and a 2.8 cm left iliac lesion on image 35. Stable mild superior endplate compression deformity at L1 without  residual marrow edema. No evidence of acute pathologic fracture. These osseous metastases enhance following contrast. Conus medullaris: Extends to the L1 level and appears normal. No abnormal intradural enhancement or significant epidural tumor. Paraspinal and other soft tissues: There are multiple enlarged retroperitoneal lymph nodes consistent with nodal metastases. These are grossly stable compared with the recent CT, measuring up to 1.6 cm short axis in the aortocaval space (image 10/8). Disc levels: No significant disc space findings at T12-L1 or L1-2. L2-3: Mild disc bulging eccentric to the left, facet and ligamentous hypertrophy. Borderline spinal stenosis with mild left foraminal narrowing. L3-4: Loss of disc height with annular disc bulging. There is moderate facet and ligamentous hypertrophy accounting for the grade 1 anterolisthesis. These factors contribute to severe spinal stenosis with moderate narrowing of both lateral recesses and mild left foraminal narrowing. L4-5: Relatively preserved disc height. Mild disc bulging with moderate facet and ligamentous hypertrophy. Mild spinal stenosis. No nerve root encroachment. L5-S1: Mild disc bulging with moderate asymmetric right-sided facet hypertrophy. No significant spinal stenosis or nerve root encroachment. IMPRESSION: 1. 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 No evidence of acute pathologic fracture. 2. Stable mild superior endplate compression deformity at L1. No residual marrow edema. 3. Stable retroperitoneal nodal metastases. 4. Severe multifactorial spinal stenosis at L3-4 with moderate narrowing of both lateral recesses and mild left foraminal narrowing. 5. Stable mild multifactorial spinal stenosis at L2-3 and L4-5. Electronically Signed   By: WRichardean SaleM.D.   On: 03/10/2019 20:13   CT Abdomen Pelvis W Contrast  Result Date:  02/15/2019 CLINICAL DATA:  Pancreatic adenocarcinoma. EXAM: CT ABDOMEN AND PELVIS WITH CONTRAST TECHNIQUE: Multidetector CT imaging of the abdomen and pelvis was performed using the standard protocol following bolus administration of intravenous contrast. CONTRAST:  60m OMNIPAQUE IOHEXOL 300 MG/ML  SOLN COMPARISON:  10/20/2018 FINDINGS: Lower chest: Unremarkable. Hepatobiliary: 8 mm hypodensity in the inferior right liver adjacent to the gallbladder (25/2) is new in the interval. 9 mm hypodensity in the tip of the left liver is stable and likely a cyst. Another 5 mm hypodensity in the dome of the liver is stable. Multiple gallstones evident with gas visible in the gallbladder lumen. Intrahepatic biliary duct dilatation seen previously has decreased with common bile duct stent visualized in situ. Pancreas: Pancreatic parenchymal atrophy noted with dilatation of the main duct in the body and tail of pancreas. No discrete pancreatic mass lesion evident although an ill-defined area of hypoenhancement in the head of the pancreas measures approximately 3 x 2.6 cm today. Spleen: No splenomegaly. No focal mass lesion. Adrenals/Urinary Tract: No adrenal nodule or mass. Kidneys unremarkable. No evidence for hydroureter. The urinary bladder appears normal for the degree of distention. Stomach/Bowel: Tiny hiatal hernia. Stomach otherwise unremarkable. Duodenum is normally positioned as is the ligament of Treitz. No small bowel wall thickening. No small bowel dilatation. The terminal ileum is normal. The appendix is normal. No gross colonic mass. No colonic wall thickening. Diverticular changes are noted in the left colon without evidence of diverticulitis. Vascular/Lymphatic: There is abdominal aortic atherosclerosis without aneurysm. Portal vein, superior mesenteric vein, and splenic vein are patent. Celiac axis and SMA are patent. Mild lymphadenopathy in the gastrohepatic and hepato duodenal ligaments is not substantially  changed. Index gastrohepatic ligament lymph node measures 9 mm short axis on 14/2. 15 mm retrocaval node on 21/2 was 18 mm short axis previously. Left para-aortic node measuring 1.4 cm short axis on 23/2 was 1.4 cm previously. No pelvic sidewall lymphadenopathy. Reproductive: The uterus is unremarkable.  There is no adnexal mass. Other: Trace free fluid noted in the cul-de-sac. Musculoskeletal: Status post left total hip replacement. Interval development of multiple lytic and sclerotic bone lesions consistent with metastatic involvement. 2.8 cm lesion identified left iliac bone on 47/2. New 13 mm lytic and sclerotic lesion in the left L2 vertebral body noted on 24/2. 14 mm sclerotic lesion in the sacrum visible on 51/2. IMPRESSION: 1. Interval development of multiple lytic and sclerotic bone lesions consistent with bony metastatic involvement. 2. Decrease in intrahepatic biliary duct dilatation with common bile duct stent visualized in situ. 3. Ill-defined hypoenhancing pancreatic head mass measuring 3.0 x 2.6 cm today. 4. New 8 mm hypodensity in the inferior right liver adjacent to the gallbladder. Attention on follow-up recommended. 5. Upper abdominal lymphadenopathy shows no substantial change. 6.  Aortic Atherosclerois (ICD10-170.0) Electronically Signed   By: EMisty StanleyM.D.   On: 02/15/2019 10:04   DG C-Arm 1-60 Min-No Report  Result Date: 01/28/2019 Fluoroscopy was utilized by the requesting physician.  No radiographic interpretation.    [facility] labs CA 19-9 was check on 10/21/2018, level was elevated at 1500.    ASSESSMENT & PLAN:  1. [place]   2. Weight loss   3. Normocytic anemia   4. Encounter for antineoplastic chemotherapy   5. Neuropathy   . # Stage IV pancreatic cancer- Labs are reviewed and discussed with patient CA19.9 1500--> 2101-->1614-->  897-->940--> 1972-->3374--> pending Labs reviewed and discussed with patient. Her counts  are  stable and acceptable to proceed with gemcitabine Abraxane treatment today. CA 19-9 has significantly trended up. Repeat level today is pending. Discussion with patient that although nonspecifically, progressively rising CA 19-9 indicates possible disease progression.  Abraxane was previously dose reduced given worsening of neuropathy.  After discussed with patient, she agrees with resume on the standard dose Abraxane 100 mg/m. Awaiting today's results. Consider repeat CT scan for further evaluation of treatment response. Liquid biopsy was obtained and sent out today.  #Worsening of bone metastasis, patient has obtained dental clearance. Proceed with Zometa today. Patient has an appointment with radiation oncology for evaluation.  Encourage patient to keep appointment.  #Neoplasm related pain/back pain Continue oxycodone 5 mg every 6 hours as needed for pain.  #Neuropathy, grade 1/2.  Currently on gabapentin 100 mg 3 times daily. I anticipate that neuropathy will be worsened with a cumulative dose of Abraxane and I recommend patient to increase to gabapentin 300 mg twice a day for better symptom improved.  #Weight loss, continue follow-up with dietitian. Palliative care following . #Discussed about genetic testing.  Patient has been referred to genetic testing.   All questions were answered. The patient knows to call the clinic with any problems questions or concerns.  cc Adin Hector, MD   Return of visit: 2 weeks We spent sufficient time to discuss many aspect of care, questions were answered to patient's satisfaction.  Earlie Server, MD, PhD Hematology Oncology [place] at [facility] 03/22/2019

## 2019-03-24 ENCOUNTER — Ambulatory Visit
Admission: RE | Admit: 2019-03-24 | Discharge: 2019-03-24 | Disposition: A | Discharge status: Home / Self Care | Payer: Medicare Other | Source: Ambulatory Visit | Attending: Radiation Oncology | Admitting: Radiation Oncology

## 2019-03-24 ENCOUNTER — Encounter: Payer: Self-pay | Admitting: Radiation Oncology

## 2019-03-24 ENCOUNTER — Other Ambulatory Visit: Payer: Self-pay

## 2019-03-24 ENCOUNTER — Other Ambulatory Visit: Payer: Self-pay | Admitting: *Deleted

## 2019-03-24 VITALS — BP 126/75 | HR 98 | Temp 96.9°F | Resp 16 | Wt 139.9 lb

## 2019-03-24 DIAGNOSIS — I10 Essential (primary) hypertension: Secondary | ICD-10-CM | POA: Diagnosis not present

## 2019-03-24 DIAGNOSIS — Z79899 Other long term (current) drug therapy: Secondary | ICD-10-CM | POA: Diagnosis not present

## 2019-03-24 DIAGNOSIS — Z7982 Long term (current) use of aspirin: Secondary | ICD-10-CM | POA: Insufficient documentation

## 2019-03-24 DIAGNOSIS — C250 Malignant neoplasm of head of pancreas: Secondary | ICD-10-CM | POA: Insufficient documentation

## 2019-03-24 DIAGNOSIS — M8580 Other specified disorders of bone density and structure, unspecified site: Secondary | ICD-10-CM | POA: Diagnosis not present

## 2019-03-24 DIAGNOSIS — R590 Localized enlarged lymph nodes: Secondary | ICD-10-CM | POA: Insufficient documentation

## 2019-03-24 DIAGNOSIS — C7951 Secondary malignant neoplasm of bone: Secondary | ICD-10-CM

## 2019-03-24 DIAGNOSIS — Z8673 Personal history of transient ischemic attack (TIA), and cerebral infarction without residual deficits: Secondary | ICD-10-CM | POA: Diagnosis not present

## 2019-03-24 NOTE — Consult Note (Signed)
NEW PATIENT EVALUATION  Name: Melissa [name]  MRN: [id number]  Date:   03/24/2019     DOB: August 30, 1940   This 79 y.o. female patient presents to the clinic for initial evaluation of palliative treatment for bone metastasis in patient with known stage IV pancreatic cancer.  REFERRING PHYSICIAN: Adin Hector, MD  CHIEF COMPLAINT:  Chief Complaint  Patient presents with  . Cancer    Initial consultation of bone mets    DIAGNOSIS: The encounter diagnosis was Bone metastasis ([place]).   PREVIOUS INVESTIGATIONS:  CT scans prior PET CT scans MRI scans reviewed Pathology report reviewed Clinical notes reviewed  HPI: Patient is a 79 year old female who originally presented with increasing nausea diarrhea and jaundice.  Abdominal pelvic CT scan in August of 05/06/2018 showed intra and extrahepatic biliary dilatation with mild diffuse dilatation of the pancreatic duct.  She underwent biliary stenting back in August 2020 biopsy at that time showed atypia and no definitive evidence of invasive carcinoma.  PET CT scan demonstrated retroperitoneal and periaortic lymphadenopathy involvement with also evidence of sclerotic lesions in the right proximal femur as well as left sacrum.  EUS biopsy of pancreatic mass was positive for malignancy adenocarcinoma.  Patient has been treated with gemcitabine and Abraxane in a palliative mode which she is tolerated fairly well.  She has had persistent back pain which is improved somewhat on narcotic analgesics although was referred today for consideration of palliative treatment.  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  CT scan of her pelvis back in early December showed development of multiple lytic and sclerotic bone lesions.  She is wheelchair-bound today though she is ambulating.  She specifically denies any focal neurologic deficits.  PLANNED TREATMENT REGIMEN: Palliative radiation therapy to  probable L-spine and SI joint region  PAST MEDICAL HISTORY:  has a past medical history of Glaucoma, Hypertension, Osteopenia, and Stroke ([place]).    PAST SURGICAL HISTORY:  Past Surgical History:  Procedure Laterality Date  . BREAST BIOPSY Right    core- stereo- neg  . ERCP N/A 10/29/2018   Procedure: ENDOSCOPIC RETROGRADE CHOLANGIOPANCREATOGRAPHY (ERCP);  Surgeon: [name], MD;  Location: [facility] ENDOSCOPY;  Service: Endoscopy;  Laterality: N/A;  . ERCP N/A 01/28/2019   Procedure: ENDOSCOPIC RETROGRADE CHOLANGIOPANCREATOGRAPHY (ERCP) STENT REMOVAL;  Surgeon: [name], MD;  Location: [facility] ENDOSCOPY;  Service: Endoscopy;  Laterality: N/A;  . EUS N/A 11/18/2018   Procedure: FULL UPPER ENDOSCOPIC ULTRASOUND (EUS) RADIAL;  Surgeon: Holly Bodily, MD;  Location: [facility] ENDOSCOPY;  Service: Gastroenterology;  Laterality: N/A;  . HIP ARTHROPLASTY Left 01/02/2016   Procedure: ARTHROPLASTY BIPOLAR HIP (HEMIARTHROPLASTY);  Surgeon: Dereck Leep, MD;  Location: [facility] ORS;  Service: Orthopedics;  Laterality: Left;  . JOINT REPLACEMENT Left    THR  . PORTACATH PLACEMENT Right 11/26/2018   Procedure: INSERTION PORT-A-CATH;  Surgeon: Jules Husbands, MD;  Location: [facility] ORS;  Service: General;  Laterality: Right;  . TONSILLECTOMY      FAMILY HISTORY: family history includes Kidney disease in her father.  SOCIAL HISTORY:  reports that she has never smoked. She has never used smokeless tobacco. She reports that she does not drink alcohol or use drugs.  ALLERGIES: Patient has no known allergies.  MEDICATIONS:  Current Outpatient Medications  Medication Sig Dispense Refill  . acetaminophen (TYLENOL) 500 MG tablet Take 500 mg by mouth every 6 (six) hours as needed.    Marland Kitchen amLODipine (NORVASC) 10  MG tablet Take 10 mg by mouth at bedtime.     . docusate sodium (COLACE) 100 MG capsule Take 1 capsule (100 mg total) by mouth daily as needed for mild constipation or moderate constipation. Do not take if  you have loose stools 60 capsule 2  . gabapentin (NEURONTIN) 300 MG capsule Take 1 capsule (300 mg total) by mouth 2 (two) times daily. 60 capsule 0  . lidocaine-prilocaine (EMLA) cream Apply to affected area once 30 g 3  . ondansetron (ZOFRAN) 8 MG tablet Take 1 tablet by mouth as needed.    Marland Kitchen oxyCODONE (ROXICODONE) 5 MG immediate release tablet Take 1 tablet (5 mg total) by mouth every 6 (six) hours as needed for moderate pain or severe pain. 60 tablet 0  . potassium chloride (K-DUR) 10 MEQ tablet Take 1 tablet (10 mEq total) by mouth daily. (Patient taking differently: Take 10 mEq by mouth 2 (two) times daily. ) 30 tablet 0  . prochlorperazine (COMPAZINE) 10 MG tablet Take 1 tablet by mouth as needed.     No current facility-administered medications for this encounter.    ECOG PERFORMANCE STATUS:  2 - Symptomatic, <50% confined to bed  REVIEW OF SYSTEMS: Patient denies any weight loss, fatigue, weakness, fever, chills or night sweats. Patient denies any loss of vision, blurred vision. Patient denies any ringing  of the ears or hearing loss. No irregular heartbeat. Patient denies heart murmur or history of fainting. Patient denies any chest pain or pain radiating to her upper extremities. Patient denies any shortness of breath, difficulty breathing at night, cough or hemoptysis. Patient denies any swelling in the lower legs. Patient denies any nausea vomiting, vomiting of blood, or coffee ground material in the vomitus. Patient denies any stomach pain. Patient states has had normal bowel movements no significant constipation or diarrhea. Patient denies any dysuria, hematuria or significant nocturia. Patient denies any problems walking, swelling in the joints or loss of balance. Patient denies any skin changes, loss of hair or loss of weight. Patient denies any excessive worrying or anxiety or significant depression. Patient denies any problems with insomnia. Patient denies excessive thirst, polyuria,  polydipsia. Patient denies any swollen glands, patient denies easy bruising or easy bleeding. Patient denies any recent infections, allergies or URI. Patient "s visual fields have not changed significantly in recent time.   PHYSICAL EXAM: BP 126/75 (BP Location: Left Arm, Patient Position: Sitting)   Pulse 98   Temp (!) 96.9 F (36.1 C) (Tympanic)   Resp 16   Wt 139 lb 14.4 oz (63.5 kg)   BMI 27.32 kg/m  Well-developed female in NAD motor sensory and DTR levels are equal and symmetrical lower extremities bilaterally.  Proprioception is intact.  Well-developed well-nourished patient in NAD. HEENT reveals PERLA, EOMI, discs not visualized.  Oral cavity is clear. No oral mucosal lesions are identified. Neck is clear without evidence of cervical or supraclavicular adenopathy. Lungs are clear to A&P. Cardiac examination is essentially unremarkable with regular rate and rhythm without murmur rub or thrill. Abdomen is benign with no organomegaly or masses noted. Motor sensory and DTR levels are equal and symmetric in the upper and lower extremities. Cranial nerves II through XII are grossly intact. Proprioception is intact. No peripheral adenopathy or edema is identified. No motor or sensory levels are noted. Crude visual fields are within normal range.  LABORATORY DATA: Pathology report reviewed    RADIOLOGY RESULTS: Bone scan ordered MRI and CT scans reviewed   IMPRESSION: Widespread  bony metastasis from known stage IV pancreatic cancer in 79 year old female for consideration of palliative radiation therapy to her L-spine and SI joints.  PLAN: This time of ordered a bone scan to better delineate all areas of bony involvement.  I would plan on delivering 3000 cGy in 10 fractions to her L-spine SI joints focusing my fields on areas of increased uptake on bone scan.  Risks and benefits of treatment including possible diarrhea possible increased lower urinary tract symptoms skin reaction fatigue  alteration of blood counts all were discussed with the patient and her husband.  They both seem to comprehend my treatment plan well.  I have scheduled bone scan for next week and her simulation the day after.  Patient knows to call with any concerns at any time.  I would like to take this opportunity to thank you for allowing me to participate in the care of your patient.Noreene Filbert, MD

## 2019-03-25 LAB — CANCER ANTIGEN 19-9: CA 19-9: 6032 U/mL — ABNORMAL HIGH (ref 0–35)

## 2019-03-30 ENCOUNTER — Telehealth: Payer: Self-pay | Admitting: *Deleted

## 2019-03-30 ENCOUNTER — Other Ambulatory Visit: Payer: Self-pay | Admitting: Oncology

## 2019-03-30 DIAGNOSIS — C250 Malignant neoplasm of head of pancreas: Secondary | ICD-10-CM

## 2019-03-30 DIAGNOSIS — D490 Neoplasm of unspecified behavior of digestive system: Secondary | ICD-10-CM

## 2019-03-30 NOTE — Telephone Encounter (Signed)
Per Dr.Yu pts needs to be scheduled for a CT Chest Abd/Pelvis Pt was scheduled as requested. I notified her and made her aware of  her scheduled 03/31/19 CT @ 1130. Pt is aware of date, time, location and that the prep kit Must be picked up today 03/30/19.

## 2019-03-31 ENCOUNTER — Ambulatory Visit
Admission: RE | Admit: 2019-03-31 | Discharge: 2019-03-31 | Disposition: A | Discharge status: Home / Self Care | Payer: Medicare Other | Source: Ambulatory Visit | Attending: Oncology | Admitting: Oncology

## 2019-03-31 ENCOUNTER — Other Ambulatory Visit: Payer: Self-pay

## 2019-03-31 ENCOUNTER — Other Ambulatory Visit: Payer: Self-pay | Admitting: Oncology

## 2019-03-31 ENCOUNTER — Encounter
Admission: RE | Admit: 2019-03-31 | Discharge: 2019-03-31 | Disposition: A | Discharge status: Home / Self Care | Payer: Medicare Other | Source: Ambulatory Visit | Attending: Radiation Oncology | Admitting: Radiation Oncology

## 2019-03-31 DIAGNOSIS — C7951 Secondary malignant neoplasm of bone: Secondary | ICD-10-CM | POA: Insufficient documentation

## 2019-03-31 DIAGNOSIS — C250 Malignant neoplasm of head of pancreas: Secondary | ICD-10-CM | POA: Insufficient documentation

## 2019-03-31 DIAGNOSIS — D490 Neoplasm of unspecified behavior of digestive system: Secondary | ICD-10-CM

## 2019-03-31 MED ORDER — LIDOCAINE-PRILOCAINE 2.5-2.5 % EX CREA: TOPICAL_CREAM | CUTANEOUS | 3 refills | Status: DC

## 2019-03-31 MED ORDER — LOPERAMIDE HCL 2 MG PO TABS: 2.0000 mg | ORAL_TABLET | ORAL | 1 refills | Status: DC

## 2019-03-31 MED ORDER — DEXAMETHASONE 4 MG PO TABS: 8.0000 mg | ORAL_TABLET | Freq: Every day | ORAL | 5 refills | Status: AC

## 2019-03-31 MED ORDER — IOHEXOL 300 MG/ML  SOLN
100.0000 mL | Freq: Once | INTRAMUSCULAR | Status: AC | PRN
  Administered 2019-03-31: 100 mL via INTRAVENOUS

## 2019-03-31 MED ORDER — PROCHLORPERAZINE MALEATE 10 MG PO TABS: 10.0000 mg | ORAL_TABLET | Freq: Four times a day (QID) | ORAL | 1 refills | Status: DC | PRN

## 2019-03-31 MED ORDER — TECHNETIUM TC 99M MEDRONATE IV KIT
20.0000 | PACK | Freq: Once | INTRAVENOUS | Status: AC | PRN
  Administered 2019-03-31: 22.989 via INTRAVENOUS

## 2019-03-31 NOTE — Progress Notes (Signed)
DISCONTINUE ON PATHWAY REGIMEN - Pancreatic Adenocarcinoma     A cycle is every 28 days:     Nab-paclitaxel (protein bound)      Gemcitabine   **Always confirm dose/schedule in your pharmacy ordering system**  REASON: Disease Progression PRIOR TREATMENT: PANOS51: Nab-Paclitaxel (Abraxane) 125 mg/m2 D1, 8, 15 + Gemcitabine 1,000 mg/m2 D1, 8, 15 q28 Days Until Progression or Toxicity TREATMENT RESPONSE: Progressive Disease (PD)  START ON PATHWAY REGIMEN - Pancreatic Adenocarcinoma     A cycle is every 14 days:     Liposomal irinotecan      Leucovorin      Fluorouracil   **Always confirm dose/schedule in your pharmacy ordering system**  Patient Characteristics: Metastatic Disease, Second Line, MSS/pMMR or MSI Unknown, Gemcitabine-Based Therapy First Line Current evidence of distant metastases<= Yes AJCC T Category: [place] AJCC N Category: N2 AJCC M Category: M1 AJCC 8 Stage Grouping: IV Line of Therapy: Second Line Microsatellite/Mismatch Repair Status: Unknown Intent of Therapy: Non-Curative / Palliative Intent, Discussed with Patient

## 2019-04-01 ENCOUNTER — Other Ambulatory Visit: Payer: Self-pay

## 2019-04-01 ENCOUNTER — Ambulatory Visit
Admission: RE | Admit: 2019-04-01 | Discharge: 2019-04-01 | Disposition: A | Discharge status: Home / Self Care | Payer: Medicare Other | Source: Ambulatory Visit | Attending: Radiation Oncology | Admitting: Radiation Oncology

## 2019-04-01 ENCOUNTER — Telehealth: Payer: Self-pay

## 2019-04-01 DIAGNOSIS — Z510 Encounter for antineoplastic radiation therapy: Secondary | ICD-10-CM | POA: Diagnosis not present

## 2019-04-01 DIAGNOSIS — C7951 Secondary malignant neoplasm of bone: Secondary | ICD-10-CM | POA: Insufficient documentation

## 2019-04-01 DIAGNOSIS — C250 Malignant neoplasm of head of pancreas: Secondary | ICD-10-CM | POA: Diagnosis present

## 2019-04-01 NOTE — Telephone Encounter (Signed)
-----   Message from Earlie Server, MD sent at 03/31/2019 10:37 PM EST ----- Please change her chemo to 5-FU and liposomal irinotecan on 1/19. If not enough time, I will still see her on 1/19 for discussion and can do chemo when she gets a spot.

## 2019-04-01 NOTE — Telephone Encounter (Signed)
Treatment has been changed for same day and time.  No P/A required.  MD will discuss with patient at next appt.

## 2019-04-04 ENCOUNTER — Encounter: Payer: Self-pay | Admitting: Oncology

## 2019-04-04 DIAGNOSIS — Z510 Encounter for antineoplastic radiation therapy: Secondary | ICD-10-CM | POA: Diagnosis not present

## 2019-04-05 ENCOUNTER — Inpatient Hospital Stay: Payer: Medicare Other

## 2019-04-05 ENCOUNTER — Ambulatory Visit
Admission: RE | Admit: 2019-04-05 | Discharge: 2019-04-05 | Disposition: A | Discharge status: Home / Self Care | Payer: Medicare Other | Source: Ambulatory Visit | Attending: Oncology | Admitting: Oncology

## 2019-04-05 ENCOUNTER — Other Ambulatory Visit: Payer: Self-pay

## 2019-04-05 ENCOUNTER — Encounter: Payer: Self-pay | Admitting: Oncology

## 2019-04-05 ENCOUNTER — Inpatient Hospital Stay (HOSPITAL_BASED_OUTPATIENT_CLINIC_OR_DEPARTMENT_OTHER): Payer: Medicare Other | Admitting: Oncology

## 2019-04-05 ENCOUNTER — Other Ambulatory Visit: Payer: Self-pay | Admitting: Oncology

## 2019-04-05 VITALS — BP 130/77 | HR 91 | Resp 20

## 2019-04-05 VITALS — BP 111/94 | HR 100 | Temp 98.1°F | Resp 18 | Wt 141.6 lb

## 2019-04-05 DIAGNOSIS — C250 Malignant neoplasm of head of pancreas: Secondary | ICD-10-CM

## 2019-04-05 DIAGNOSIS — M7989 Other specified soft tissue disorders: Secondary | ICD-10-CM | POA: Insufficient documentation

## 2019-04-05 DIAGNOSIS — D490 Neoplasm of unspecified behavior of digestive system: Secondary | ICD-10-CM

## 2019-04-05 DIAGNOSIS — Z5111 Encounter for antineoplastic chemotherapy: Secondary | ICD-10-CM

## 2019-04-05 DIAGNOSIS — D649 Anemia, unspecified: Secondary | ICD-10-CM | POA: Diagnosis not present

## 2019-04-05 DIAGNOSIS — N1330 Unspecified hydronephrosis: Secondary | ICD-10-CM

## 2019-04-05 DIAGNOSIS — G629 Polyneuropathy, unspecified: Secondary | ICD-10-CM

## 2019-04-05 DIAGNOSIS — Z95828 Presence of other vascular implants and grafts: Secondary | ICD-10-CM

## 2019-04-05 LAB — CBC WITH DIFFERENTIAL/PLATELET
Abs Immature Granulocytes: 0.14 10*3/uL — ABNORMAL HIGH (ref 0.00–0.07)
Basophils Absolute: 0 10*3/uL (ref 0.0–0.1)
Basophils Relative: 0 %
Eosinophils Absolute: 0 10*3/uL (ref 0.0–0.5)
Eosinophils Relative: 0 %
HCT: 34.5 % — ABNORMAL LOW (ref 36.0–46.0)
Hemoglobin: 11.2 g/dL — ABNORMAL LOW (ref 12.0–15.0)
Immature Granulocytes: 1 %
Lymphocytes Relative: 4 %
Lymphs Abs: 0.5 10*3/uL — ABNORMAL LOW (ref 0.7–4.0)
MCH: 35.2 pg — ABNORMAL HIGH (ref 26.0–34.0)
MCHC: 32.5 g/dL (ref 30.0–36.0)
MCV: 108.5 fL — ABNORMAL HIGH (ref 80.0–100.0)
Monocytes Absolute: 1.8 10*3/uL — ABNORMAL HIGH (ref 0.1–1.0)
Monocytes Relative: 14 %
Neutro Abs: 10.3 10*3/uL — ABNORMAL HIGH (ref 1.7–7.7)
Neutrophils Relative %: 81 %
Platelets: 342 10*3/uL (ref 150–400)
RBC: 3.18 MIL/uL — ABNORMAL LOW (ref 3.87–5.11)
RDW: 15.9 % — ABNORMAL HIGH (ref 11.5–15.5)
WBC: 12.8 10*3/uL — ABNORMAL HIGH (ref 4.0–10.5)
nRBC: 0 % (ref 0.0–0.2)

## 2019-04-05 LAB — COMPREHENSIVE METABOLIC PANEL
ALT: 16 U/L (ref 0–44)
AST: 30 U/L (ref 15–41)
Albumin: 3.1 g/dL — ABNORMAL LOW (ref 3.5–5.0)
Alkaline Phosphatase: 599 U/L — ABNORMAL HIGH (ref 38–126)
Anion gap: 10 (ref 5–15)
BUN: 16 mg/dL (ref 8–23)
CO2: 22 mmol/L (ref 22–32)
Calcium: 8.2 mg/dL — ABNORMAL LOW (ref 8.9–10.3)
Chloride: 101 mmol/L (ref 98–111)
Creatinine, Ser: 0.67 mg/dL (ref 0.44–1.00)
GFR calc Af Amer: >60 mL/min (ref >60)
GFR calc non Af Amer: >60 mL/min (ref >60)
Glucose, Bld: 171 mg/dL — ABNORMAL HIGH (ref 70–99)
Potassium: 4 mmol/L (ref 3.5–5.1)
Sodium: 133 mmol/L — ABNORMAL LOW (ref 135–145)
Total Bilirubin: 0.8 mg/dL (ref 0.3–1.2)
Total Protein: 7.7 g/dL (ref 6.5–8.1)

## 2019-04-05 MED ORDER — PALONOSETRON HCL INJECTION 0.25 MG/5ML
0.2500 mg | Freq: Once | INTRAVENOUS | Status: AC
  Administered 2019-04-05: 0.25 mg via INTRAVENOUS
  Filled 2019-04-05: qty 5

## 2019-04-05 MED ORDER — SODIUM CHLORIDE 0.9 % IV SOLN
2400.0000 mg/m2 | INTRAVENOUS | Status: DC
  Administered 2019-04-05: 3950 mg via INTRAVENOUS
  Filled 2019-04-05: qty 79

## 2019-04-05 MED ORDER — SODIUM CHLORIDE 0.9% FLUSH
10.0000 mL | INTRAVENOUS | Status: DC | PRN
  Administered 2019-04-05: 10 mL
  Filled 2019-04-05: qty 10

## 2019-04-05 MED ORDER — SODIUM CHLORIDE 0.9 % IV SOLN
396.0000 mg/m2 | Freq: Once | INTRAVENOUS | Status: AC
  Administered 2019-04-05: 650 mg via INTRAVENOUS
  Filled 2019-04-05: qty 25

## 2019-04-05 MED ORDER — SODIUM CHLORIDE 0.9 % IV SOLN
52.0000 mg/m2 | Freq: Once | INTRAVENOUS | Status: AC
  Administered 2019-04-05: 86 mg via INTRAVENOUS
  Filled 2019-04-05: qty 20

## 2019-04-05 MED ORDER — SODIUM CHLORIDE 0.9 % IV SOLN
Freq: Once | INTRAVENOUS | Status: AC
  Filled 2019-04-05: qty 250

## 2019-04-05 MED ORDER — SODIUM CHLORIDE 0.9% FLUSH
10.0000 mL | Freq: Once | INTRAVENOUS | Status: AC
  Administered 2019-04-05: 10 mL via INTRAVENOUS
  Filled 2019-04-05: qty 10

## 2019-04-05 MED ORDER — SODIUM CHLORIDE 0.9 % IV SOLN
10.0000 mg | Freq: Once | INTRAVENOUS | Status: AC
  Administered 2019-04-05: 10 mg via INTRAVENOUS
  Filled 2019-04-05: qty 10

## 2019-04-05 NOTE — Progress Notes (Signed)
Hematology/Oncology follow up note [facility] [phone] [phone]   Patient Care Team: Adin Hector, MD as PCP - General (Internal Medicine) Clent Jacks, RN as Oncology Nurse Navigator  REFERRING PROVIDER: Adin Hector, MD  CHIEF COMPLAINTS/REASON FOR VISIT:  Follow-up for pancreatic cancer   HISTORY OF PRESENTING ILLNESS:   Melissa [name] is a  79 y.o.  female with PMH listed below was seen in consultation at the request of  Adin Hector, MD  for evaluation of abnormal pancreatic disease. Patient was recently seen by primary care provider Dr. Caryl Comes for evaluation of nausea, diarrhea and jaundice.  Blood work on 10/19/2018 showed potassium 2.9, bilirubin 15, alkaline phosphatase 478, AST 114, ALT 95. Abdomen pelvis CT scan on 10/20/2018 showed moderate to market intra-and extra hepatic biliary dilation with mild diffuse dilatation of the pancreatic duct. Patient has had poor appetite and has lost a 5 to 10 pounds recently. She also had diarrhea.  She noticed sand like stool for 1- 2 weeks.  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  She placed her mother to assisted living yesterday. She has had discussion with Dr. Caryl Comes about her blood work and CT scans.  She understands that there is a strong suspicion of cancer.  Nonessential medication has been stopped. CA 19-9 was check on 10/21/2018, level was elevated at 1500.  Today she denies any pain.  Itchy all over her body.. Per patient's request, patient's daughter Melissa [name] was [name] and Melissa [name] was able to hear entire clinical encounter conversation and participate in the reported history and discussion.  # Patient was admitted from 10/29/2018-10/31/2018.  Status post ERCP and biliary stenting.  Patient had duodenum ampulla biopsy showed nondiagnostic.  Negative for  invasive carcinoma. Bilirubin trended down to 11 at the time of discharge on 10/31/2018.  #PET scan images were independent reviewed by me and discussed with patient. cTxN2 M1a Patient has at least M1 a disease due to retroperitoneal/peri-aortic lymph node involvement,  sclerotic lesion of right proximal femur with mild hypermetabolic activity, as well as left sacrum  # EUS biopsy of pancreatic mass showed positive for malignancy, Adenocarcinoma. There is not enough tissue for NGS testing.  Attempt to at MMR, still not enough tissue to complete the testing.  INTERVAL HISTORY Melissa [name] is a 79 y.o. female who has above history reviewed by me today presents for follow up visit for management of stage IV pancreatic cancer Patient has been on palliative chemotherapy with gemcitabine and Abraxane.   Patient reports feeling tired and fatigued.  Back pain is controlled with current regimen. Patient has establish care with radiation oncology and plan for palliative radiation to her spine. Also reports bilateral lower extremity edema which developed since last visit.  No tenderness. During the interval patient had CT scan done due to the concern of progressively increasing of tumor marker.     Review of Systems  Constitutional: Positive for fatigue. Negative for appetite change, chills and fever.  HENT:   Negative for hearing loss and voice change.   Eyes: Negative for eye problems and icterus.  Respiratory: Negative for chest tightness and cough.   Cardiovascular: Positive for leg swelling. Negative for chest pain.  Gastrointestinal: Negative for abdominal distention, abdominal pain and blood in stool.  Endocrine: Negative for hot flashes.  Genitourinary: Negative for  difficulty urinating and frequency.   Musculoskeletal: Positive for back pain. Negative for arthralgias.  Skin: Negative for itching and rash.  Neurological: Positive for numbness. Negative for extremity weakness.    Hematological: Negative for adenopathy.  Psychiatric/Behavioral: Negative for confusion.    MEDICAL HISTORY:  Past Medical History:  Diagnosis Date   Glaucoma    Hypertension    Osteopenia    Stroke [facility])     SURGICAL HISTORY: Past Surgical History:  Procedure Laterality Date   BREAST BIOPSY Right    core- stereo- neg   ERCP N/A 10/29/2018   Procedure: ENDOSCOPIC RETROGRADE CHOLANGIOPANCREATOGRAPHY (ERCP);  Surgeon: [name], MD;  Location: [facility] ENDOSCOPY;  Service: Endoscopy;  Laterality: N/A;   ERCP N/A 01/28/2019   Procedure: ENDOSCOPIC RETROGRADE CHOLANGIOPANCREATOGRAPHY (ERCP) STENT REMOVAL;  Surgeon: [name], MD;  Location: [facility] ENDOSCOPY;  Service: Endoscopy;  Laterality: N/A;   EUS N/A 11/18/2018   Procedure: FULL UPPER ENDOSCOPIC ULTRASOUND (EUS) RADIAL;  Surgeon: Holly Bodily, MD;  Location: [facility] ENDOSCOPY;  Service: Gastroenterology;  Laterality: N/A;   HIP ARTHROPLASTY Left 01/02/2016   Procedure: ARTHROPLASTY BIPOLAR HIP (HEMIARTHROPLASTY);  Surgeon: Dereck Leep, MD;  Location: [facility] ORS;  Service: Orthopedics;  Laterality: Left;   JOINT REPLACEMENT Left    THR   PORTACATH PLACEMENT Right 11/26/2018   Procedure: INSERTION PORT-A-CATH;  Surgeon: Jules Husbands, MD;  Location: [facility] ORS;  Service: General;  Laterality: Right;   TONSILLECTOMY      SOCIAL HISTORY: Social History   Socioeconomic History   Marital status: Married    Spouse name: Not on file   Number of children: Not on file   Years of education: Not on file   Highest education level: Not on file  Occupational History   Not on file  Tobacco Use   Smoking status: Never Smoker   Smokeless tobacco: Never Used  Substance and Sexual Activity   Alcohol use: No   Drug use: Never   Sexual activity: Not on file  Other Topics Concern   Not on file  Social History Narrative   Not on file   Social Determinants of Health   Financial Resource Strain:     Difficulty of Paying Living Expenses: Not on file  Food Insecurity:    Worried About Running Out of Food in the Last Year: Not on file   Ran Out of Food in the Last Year: Not on file  Transportation Needs:    Lack of Transportation (Medical): Not on file   Lack of Transportation (Non-Medical): Not on file  Physical Activity:    Days of Exercise per Week: Not on file   Minutes of Exercise per Session: Not on file  Stress:    Feeling of Stress : Not on file  Social Connections:    Frequency of Communication with Friends and Family: Not on file   Frequency of Social Gatherings with Friends and Family: Not on file   Attends Religious Services: Not on file   Active Member of Clubs or Organizations: Not on file   Attends Archivist Meetings: Not on file   Marital Status: Not on file  Intimate Partner Violence:    Fear of Current or Ex-Partner: Not on file   Emotionally Abused: Not on file   Physically Abused: Not on file   Sexually Abused: Not on file    FAMILY HISTORY: Family History  Problem Relation Age of Onset   Kidney disease Father    Breast cancer Neg Hx  ALLERGIES:  has No Known Allergies.  MEDICATIONS:  Current Outpatient Medications  Medication Sig Dispense Refill   acetaminophen (TYLENOL) 500 MG tablet Take 500 mg by mouth every 6 (six) hours as needed.     amLODipine (NORVASC) 10 MG tablet Take 10 mg by mouth at bedtime.      dexamethasone (DECADRON) 4 MG tablet Take 2 tablets (8 mg total) by mouth daily. Start the day after irinotecan chemotherapy for 2 days. 8 tablet 5   docusate sodium (COLACE) 100 MG capsule Take 1 capsule (100 mg total) by mouth daily as needed for mild constipation or moderate constipation. Do not take if you have loose stools 60 capsule 2   gabapentin (NEURONTIN) 300 MG capsule Take 1 capsule (300 mg total) by mouth 2 (two) times daily. 60 capsule 0   lidocaine-prilocaine (EMLA) cream Apply to affected  area once 30 g 3   loperamide (IMODIUM A-D) 2 MG tablet Take 1 tablet (2 mg total) by mouth See admin instructions. Take 2 at diarrhea onset , then 1 every 2hr until 12hrs with no BM. May take 2 every 4hrs at night. If diarrhea recurs repeat. 100 tablet 1   ondansetron (ZOFRAN) 8 MG tablet Take 1 tablet by mouth as needed.     oxyCODONE (ROXICODONE) 5 MG immediate release tablet Take 1 tablet (5 mg total) by mouth every 6 (six) hours as needed for moderate pain or severe pain. 60 tablet 0   potassium chloride (K-DUR) 10 MEQ tablet Take 1 tablet (10 mEq total) by mouth daily. (Patient taking differently: Take 10 mEq by mouth 2 (two) times daily. ) 30 tablet 0   prochlorperazine (COMPAZINE) 10 MG tablet Take 1 tablet by mouth as needed.     prochlorperazine (COMPAZINE) 10 MG tablet Take 1 tablet (10 mg total) by mouth every 6 (six) hours as needed (NAUSEA). 30 tablet 1   No current facility-administered medications for this visit.   Facility-Administered Medications Ordered in Other Visits  Medication Dose Route Frequency Provider Last Rate Last Admin   fluorouracil (ADRUCIL) 3,950 mg in sodium chloride 0.9 % 71 mL chemo infusion  2,400 mg/m2 (Treatment Plan Recorded) Intravenous 1 day or 1 dose Earlie Server, MD       irinotecan LIPOSOME (ONIVYDE) 86 mg in sodium chloride 0.9 % 500 mL chemo infusion  52 mg/m2 (Treatment Plan Recorded) Intravenous Once Earlie Server, MD       leucovorin 650 mg in sodium chloride 0.9 % 250 mL infusion  396 mg/m2 (Treatment Plan Recorded) Intravenous Once Earlie Server, MD         PHYSICAL EXAMINATION: ECOG PERFORMANCE STATUS: 2 - Symptomatic, <50% confined to bed Vitals:   04/05/19 0926  BP: (!) 111/94  Pulse: 100  Resp: 18  Temp: 98.1 F (36.7 C)  SpO2: 99%   Filed Weights   04/05/19 0926  Weight: 141 lb 9.6 oz (64.2 kg)    Physical Exam Constitutional:      General: She is not in acute distress.    Appearance: She is ill-appearing.     Comments: Frail,  sits in wheelchair.  HENT:     Head: Normocephalic and atraumatic.     Comments: Alopecia Eyes:     General: No scleral icterus.    Pupils: Pupils are equal, round, and reactive to light.  Cardiovascular:     Rate and Rhythm: Normal rate and regular rhythm.     Heart sounds: Normal heart sounds.  Pulmonary:  Effort: Pulmonary effort is normal. No respiratory distress.     Breath sounds: Normal breath sounds. No wheezing.  Abdominal:     General: Bowel sounds are normal. There is no distension.     Palpations: Abdomen is soft. There is no mass.     Tenderness: There is no abdominal tenderness.  Musculoskeletal:        General: No deformity. Normal range of motion.     Cervical back: Normal range of motion and neck supple.  Skin:    General: Skin is warm and dry.     Coloration: Skin is not jaundiced.     Findings: No erythema or rash.  Neurological:     Mental Status: She is alert and oriented to person, place, and time.     Cranial Nerves: No cranial nerve deficit.     Coordination: Coordination normal.  Psychiatric:        Behavior: Behavior normal.        Thought Content: Thought content normal.     LABORATORY DATA:  I have reviewed the data as listed Lab Results  Component Value Date   WBC 12.8 (H) 04/05/2019   HGB 11.2 (L) 04/05/2019   HCT 34.5 (L) 04/05/2019   MCV 108.5 (H) 04/05/2019   PLT 342 04/05/2019   Recent Labs    10/29/18 1318 10/30/18 0531 03/15/19 0817 03/22/19 1107 04/05/19 0851  NA 135   < > 137 138 133*  K 3.0*   < > 3.5 4.1 4.0  CL 104   < > 102 104 101  CO2 21*   < > '24 25 22  ' GLUCOSE 110*   < > 171* 124* 171*  BUN 13   < > '11 13 16  ' CREATININE 0.32*   < > 0.62 0.63 0.67  CALCIUM 9.0   < > 9.3 9.6 8.2*  GFRNONAA >60   < > >60 >60 >60  GFRAA >60   < > >60 >60 >60  PROT 7.0   < > 8.1 7.5 7.7  ALBUMIN 2.6*   < > 3.5 3.3* 3.1*  AST 119*   < > 29 41 30  ALT 79*   < > '19 23 16  ' ALKPHOS 399*   < > 535* 382* 599*  BILITOT 18.2*   < >  0.6 0.5 0.8  BILIDIR 10.2*  --   --   --   --    < > = values in this interval not displayed.   Iron/TIBC/Ferritin/ %Sat No results found for: IRON, TIBC, FERRITIN, IRONPCTSAT   RADIOGRAPHIC STUDIES: I have personally reviewed the radiological images as listed and agreed with the findings in the report. 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 Restaging. EXAM: CT CHEST, ABDOMEN, AND PELVIS WITH CONTRAST TECHNIQUE: Multidetector CT imaging of the chest, abdomen and pelvis was performed following the standard protocol during bolus administration of intravenous contrast. CONTRAST:  161m OMNIPAQUE IOHEXOL 300 MG/ML  SOLN COMPARISON:  02/15/2019 CT abdomen/pelvis.  11/09/2018 PET-CT. FINDINGS: CT CHEST FINDINGS Cardiovascular: Top-normal heart size. Right internal jugular Port-A-Cath terminates at the cavoatrial junction. No significant pericardial effusion/thickening. Atherosclerotic nonaneurysmal thoracic aorta. Normal caliber pulmonary arteries. No central pulmonary emboli. Mediastinum/Nodes: Heterogeneous thyroid gland with subcentimeter bilateral thyroid nodules requiring no follow-up. Unremarkable esophagus. No axillary or hilar adenopathy. Mildly enlarged 1.0 cm lower right paraesophageal node (series 2/image 34), stable since 11/09/2018 PET-CT. Left retrocrural enlarged 1.4 cm  node (series 2/image 46), increased from 0.9 cm on 02/15/2019 CT abdomen. No additional pathologically enlarged mediastinal nodes. Lungs/Pleura: No pneumothorax. No pleural effusion. Anterior right middle lobe 3 mm solid pulmonary nodule (series 4/image 80) is stable since 11/09/2018 PET-CT. Mild platelike scarring versus atelectasis at both lung bases. No acute consolidative airspace disease, lung masses or new significant pulmonary nodules. Musculoskeletal: Several new sclerotic lesions throughout thoracic osseous  structures including the sternum, bilateral ribs, left superior scapula, left clavicle and thoracic spine, new since 11/09/2018 PET-CT. Moderate thoracic spondylosis. CT ABDOMEN PELVIS FINDINGS Hepatobiliary: Normal liver size. Simple lateral segment left liver lobe 1.0 cm cyst is stable. Subcentimeter hypodense segment 4B left liver lobe lesion is new (series 2/image 57). Subcentimeter hypodense segment 6 right liver lesion (series 2/image 58) is stable. Subcentimeter segment 8 right liver dome hypodense lesion (series 2/image 39) is stable. No additional new liver lesions. Numerous calcified gallstones throughout the gallbladder with stable mild diffuse gallbladder wall thickening. Pneumobilia in the intrahepatic left liver lobe bile ducts with stable mild central intrahepatic biliary ductal dilatation. CBD stent well positioned with distal tip in the duodenal lumen just beyond the ampulla. Pneumobilia throughout the stent lumen. Pancreas: Stable mild fullness of the pancreatic head with an distinct surrounding soft tissue planes and no discrete measurable pancreatic mass by CT. Stable mild diffuse pancreatic duct dilation. No new pancreatic lesions. Spleen: Normal size. No mass. Adrenals/Urinary Tract: Normal adrenals. New moderate to severe right hydroureteronephrosis to the level of the lower right lumbar ureter without discrete obstructing stone or mass, although with mild urothelial wall thickening at the site of caliber transition of the right ureter (series 2/image 86). No left hydronephrosis. No renal masses. Bladder nondistended and obscured by streak artifact from left hip hardware. Stomach/Bowel: Small hiatal hernia. Otherwise normal nondistended stomach. Normal caliber small bowel with no small bowel wall thickening. Normal appendix. Mild sigmoid diverticulosis, with no large bowel wall thickening or acute pericolonic fat stranding. Vascular/Lymphatic: Atherosclerotic nonaneurysmal abdominal aorta.  Patent portal, splenic, hepatic and renal veins. Stable mildly enlarged 1.0 cm gastrohepatic ligament lymph node (series 2/image 48). Enlarged 1.4 cm porta hepatis node (series 2/image 51), previously 1.3 cm on 02/15/2019 CT, not appreciably changed. Stable enlarged 1.3 cm portacaval node (series 2/image 58). Enlarged 2.2 cm aortocaval node (series 2/image 53), previously 1.9 cm, mildly increased. Enlarged 1.7 cm left para-aortic node (series 2/image 60), previously 1.6 cm, not substantially changed. No pathologically enlarged pelvic lymph nodes. Reproductive: Grossly normal uterus.  No adnexal mass. Other: No pneumoperitoneum, ascites or focal fluid collection. Upper omental 1.3 cm soft tissue nodule (series 2/image 68), previously a 1.1 cm, slightly increased. Adjacent 0.7 cm upper omental soft tissue nodule (series 2/image 67), previously 0.5 cm, slightly increased. Small fat containing umbilical hernia. Musculoskeletal: Several sclerotic lesions scattered throughout the lumbar spine, upper sacrum, bilateral iliac bones and right pubic rami are unchanged since 02/15/2019 CT. No new osseous lesions. Left total hip arthroplasty. Mild lumbar spondylosis. IMPRESSION: 1. Findings suggest mild progression of metastatic disease. 2. Left retrocrural and aortocaval lymphadenopathy is mildly increased. Lower right paraesophageal, gastrohepatic ligament, porta hepatis, portacaval and left para-aortic lymphadenopathy is stable. 3. Upper omental soft tissue nodules are slightly increased and likely represent peritoneal metastases. 4. Three scattered subcentimeter hypodense liver lesions, one of which is new and two of which are stable, suspicious for liver metastases. 5. 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 Well-positioned  CBD stent with pneumobilia indicating stent patency. 6. New moderate to severe right  hydroureteronephrosis to the level of the lower right lumbar ureter. No discrete stone or mass, although there is mild urothelial wall thickening at the site of caliber transition in the right ureter. Findings are indeterminate for benign treatment related or malignant right ureteral stricture. 7. 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 Sclerotic osseous metastases throughout the chest are new since most recent comparison chest imaging of 11/09/2018 PET-CT, compatible with response to therapy. 8. Aortic Atherosclerosis (ICD10-I70.0). Additional chronic findings as detailed. Electronically Signed   By: Ilona Sorrel M.D.   On: 03/31/2019 13:25   MR Lumbar Spine W Wo Contrast  Result Date: 03/10/2019 CLINICAL DATA:  Pancreatic cancer. Osseous metastases on CT. EXAM: MRI LUMBAR SPINE WITHOUT AND WITH CONTRAST TECHNIQUE: Multiplanar and multiecho pulse sequences of the lumbar spine were obtained without and with intravenous contrast. CONTRAST:  55m GADAVIST GADOBUTROL 1 MMOL/ML IV SOLN COMPARISON:  Abdominopelvic CT 02/15/2019. PET-CT 11/09/2018. FINDINGS: Segmentation: Conventional anatomy assumed, with the last open disc space designated L5-S1.Concordant with previous imaging. Alignment: Stable slight degenerative anterolisthesis at L3-4. Vertebrae: As seen on the recent CT, there is widespread osseous metastatic disease to the lumbar spine, sacrum and both iliac bones. Representative lesions on series 8 include a 2.2 cm lesion in the L2 vertebral body (image 12), a 2.4 cm lesion in the L4 vertebral body (image 26) and a 2.8 cm left iliac lesion on image 35. Stable mild superior endplate compression deformity at L1 without residual marrow edema. No evidence of acute pathologic fracture. These osseous metastases enhance following contrast. Conus medullaris: Extends to the L1 level and appears normal. No abnormal intradural enhancement or significant epidural  tumor. Paraspinal and other soft tissues: There are multiple enlarged retroperitoneal lymph nodes consistent with nodal metastases. These are grossly stable compared with the recent CT, measuring up to 1.6 cm short axis in the aortocaval space (image 10/8). Disc levels: No significant disc space findings at T12-L1 or L1-2. L2-3: Mild disc bulging eccentric to the left, facet and ligamentous hypertrophy. Borderline spinal stenosis with mild left foraminal narrowing. L3-4: Loss of disc height with annular disc bulging. There is moderate facet and ligamentous hypertrophy accounting for the grade 1 anterolisthesis. These factors contribute to severe spinal stenosis with moderate narrowing of both lateral recesses and mild left foraminal narrowing. L4-5: Relatively preserved disc height. Mild disc bulging with moderate facet and ligamentous hypertrophy. Mild spinal stenosis. No nerve root encroachment. L5-S1: Mild disc bulging with moderate asymmetric right-sided facet hypertrophy. No significant spinal stenosis or nerve root encroachment. IMPRESSION: 1. 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 No evidence of acute pathologic fracture. 2. Stable mild superior endplate compression deformity at L1. No residual marrow edema. 3. Stable retroperitoneal nodal metastases. 4. Severe multifactorial spinal stenosis at L3-4 with moderate narrowing of both lateral recesses and mild left foraminal narrowing. 5. Stable mild multifactorial spinal stenosis at L2-3 and L4-5. Electronically Signed   By: WRichardean SaleM.D.   On: 03/10/2019 20:13   NM Bone Scan Whole Body  Result Date: 03/31/2019 CLINICAL DATA:  Stage IV pancreatic cancer EXAM: NUCLEAR MEDICINE WHOLE BODY BONE SCAN TECHNIQUE: Whole body anterior and posterior images were obtained approximately 3 hours after intravenous injection of radiopharmaceutical.  RADIOPHARMACEUTICALS:  22.989 mCi Technetium-933mDP IV COMPARISON:  CT chest  abdomen pelvis 03/31/2019, PET-CT 11/09/2018 FINDINGS: Numerous sites of abnormal osseous tracer accumulation are identified consistent with widespread osseous metastatic disease. These include calvaria, BILATERAL ribs, sternum, thoracic/lumbar spine, and pelvis. In addition, numerous foci of abnormal uptake are seen within BILATERAL humeri, BILATERAL femora, and proximal forearms likely radius. Many of these are identified on accompanying CT as well. RIGHT hydronephrosis and hydroureter are identified with abnormal thickening at the mid RIGHT ureter series 2, image 86. LEFT hip prosthesis incidentally noted. IMPRESSION: Widespread osseous metastatic disease including long bone metastases as above. RIGHT hydronephrosis and proximal hydroureter. Electronically Signed   By: Lavonia Dana M.D.   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 Restaging. EXAM: CT CHEST, ABDOMEN, AND PELVIS WITH CONTRAST TECHNIQUE: Multidetector CT imaging of the chest, abdomen and pelvis was performed following the standard protocol during bolus administration of intravenous contrast. CONTRAST:  168m OMNIPAQUE IOHEXOL 300 MG/ML  SOLN COMPARISON:  02/15/2019 CT abdomen/pelvis.  11/09/2018 PET-CT. FINDINGS: CT CHEST FINDINGS Cardiovascular: Top-normal heart size. Right internal jugular Port-A-Cath terminates at the cavoatrial junction. No significant pericardial effusion/thickening. Atherosclerotic nonaneurysmal thoracic aorta. Normal caliber pulmonary arteries. No central pulmonary emboli. Mediastinum/Nodes: Heterogeneous thyroid gland with subcentimeter bilateral thyroid nodules requiring no follow-up. Unremarkable esophagus. No axillary or hilar adenopathy. Mildly enlarged 1.0 cm lower right paraesophageal node (series 2/image  34), stable since 11/09/2018 PET-CT. Left retrocrural enlarged 1.4 cm node (series 2/image 46), increased from 0.9 cm on 02/15/2019 CT abdomen. No additional pathologically enlarged mediastinal nodes. Lungs/Pleura: No pneumothorax. No pleural effusion. Anterior right middle lobe 3 mm solid pulmonary nodule (series 4/image 80) is stable since 11/09/2018 PET-CT. Mild platelike scarring versus atelectasis at both lung bases. No acute consolidative airspace disease, lung masses or new significant pulmonary nodules. Musculoskeletal: Several new sclerotic lesions throughout thoracic osseous structures including the sternum, bilateral ribs, left superior scapula, left clavicle and thoracic spine, new since 11/09/2018 PET-CT. Moderate thoracic spondylosis. CT ABDOMEN PELVIS FINDINGS Hepatobiliary: Normal liver size. Simple lateral segment left liver lobe 1.0 cm cyst is stable. Subcentimeter hypodense segment 4B left liver lobe lesion is new (series 2/image 57). Subcentimeter hypodense segment 6 right liver lesion (series 2/image 58) is stable. Subcentimeter segment 8 right liver dome hypodense lesion (series 2/image 39) is stable. No additional new liver lesions. Numerous calcified gallstones throughout the gallbladder with stable mild diffuse gallbladder wall thickening. Pneumobilia in the intrahepatic left liver lobe bile ducts with stable mild central intrahepatic biliary ductal dilatation. CBD stent well positioned with distal tip in the duodenal lumen just beyond the ampulla. Pneumobilia throughout the stent lumen. Pancreas: Stable mild fullness of the pancreatic head with an distinct surrounding soft tissue planes and no discrete measurable pancreatic mass by CT. Stable mild diffuse pancreatic duct dilation. No new pancreatic lesions. Spleen: Normal size. No mass. Adrenals/Urinary Tract: Normal adrenals. New moderate to severe right hydroureteronephrosis to the level of the lower right lumbar ureter without discrete  obstructing stone or mass, although with mild urothelial wall thickening at the site of caliber transition of the right ureter (series 2/image 86). No left hydronephrosis. No renal masses. Bladder nondistended and obscured by streak artifact from left hip hardware. Stomach/Bowel: Small hiatal hernia. Otherwise normal nondistended stomach. Normal caliber small bowel with no small bowel wall thickening. Normal appendix. Mild sigmoid diverticulosis, with no large bowel wall thickening or acute pericolonic fat stranding. Vascular/Lymphatic: Atherosclerotic nonaneurysmal abdominal aorta.  Patent portal, splenic, hepatic and renal veins. Stable mildly enlarged 1.0 cm gastrohepatic ligament lymph node (series 2/image 48). Enlarged 1.4 cm porta hepatis node (series 2/image 51), previously 1.3 cm on 02/15/2019 CT, not appreciably changed. Stable enlarged 1.3 cm portacaval node (series 2/image 58). Enlarged 2.2 cm aortocaval node (series 2/image 53), previously 1.9 cm, mildly increased. Enlarged 1.7 cm left para-aortic node (series 2/image 60), previously 1.6 cm, not substantially changed. No pathologically enlarged pelvic lymph nodes. Reproductive: Grossly normal uterus.  No adnexal mass. Other: No pneumoperitoneum, ascites or focal fluid collection. Upper omental 1.3 cm soft tissue nodule (series 2/image 68), previously a 1.1 cm, slightly increased. Adjacent 0.7 cm upper omental soft tissue nodule (series 2/image 67), previously 0.5 cm, slightly increased. Small fat containing umbilical hernia. Musculoskeletal: Several sclerotic lesions scattered throughout the lumbar spine, upper sacrum, bilateral iliac bones and right pubic rami are unchanged since 02/15/2019 CT. No new osseous lesions. Left total hip arthroplasty. Mild lumbar spondylosis. IMPRESSION: 1. Findings suggest mild progression of metastatic disease. 2. Left retrocrural and aortocaval lymphadenopathy is mildly increased. Lower right paraesophageal, gastrohepatic  ligament, porta hepatis, portacaval and left para-aortic lymphadenopathy is stable. 3. Upper omental soft tissue nodules are slightly increased and likely represent peritoneal metastases. 4. Three scattered subcentimeter hypodense liver lesions, one of which is new and two of which are stable, suspicious for liver metastases. 5. 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 Well-positioned CBD stent with pneumobilia indicating stent patency. 6. 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 No discrete stone or mass, although there is mild urothelial wall thickening at the site of caliber transition in the right ureter. Findings are indeterminate for benign treatment related or malignant right ureteral stricture. 7. 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 Sclerotic osseous metastases throughout the chest are new since most recent comparison chest imaging of 11/09/2018 PET-CT, compatible with response to therapy. 8. Aortic Atherosclerosis (ICD10-I70.0). Additional chronic findings as detailed. Electronically Signed   By: Ilona Sorrel M.D.   On: 03/31/2019 13:25   CT Abdomen Pelvis W Contrast  Result Date: 02/15/2019 CLINICAL DATA:  Pancreatic adenocarcinoma. EXAM: CT ABDOMEN AND PELVIS WITH CONTRAST TECHNIQUE: Multidetector CT imaging of the abdomen and pelvis was performed using the standard protocol following bolus administration of intravenous contrast. CONTRAST:  70m OMNIPAQUE IOHEXOL 300 MG/ML  SOLN COMPARISON:  10/20/2018 FINDINGS: Lower chest: Unremarkable. Hepatobiliary: 8 mm hypodensity in the inferior right liver adjacent to the gallbladder (25/2) is new in the interval. 9 mm hypodensity in the tip of the left liver is stable and likely a cyst. Another 5 mm hypodensity in the dome of the liver  is stable. Multiple gallstones evident with gas visible in the gallbladder lumen. Intrahepatic biliary duct dilatation seen previously has decreased with common bile duct stent visualized in situ. Pancreas: Pancreatic parenchymal atrophy noted with dilatation of the main duct in the body and tail of pancreas. No discrete pancreatic mass lesion evident although an ill-defined area of hypoenhancement in the head of the pancreas measures approximately 3 x 2.6 cm today. Spleen: No splenomegaly. No focal mass lesion. Adrenals/Urinary Tract: No adrenal nodule or mass. Kidneys unremarkable. No evidence for hydroureter. The urinary bladder appears normal for the degree of distention. Stomach/Bowel: Tiny hiatal hernia. Stomach otherwise unremarkable. Duodenum is normally positioned as is the ligament of Treitz. No small  bowel wall thickening. No small bowel dilatation. The terminal ileum is normal. The appendix is normal. No gross colonic mass. No colonic wall thickening. Diverticular changes are noted in the left colon without evidence of diverticulitis. Vascular/Lymphatic: There is abdominal aortic atherosclerosis without aneurysm. Portal vein, superior mesenteric vein, and splenic vein are patent. Celiac axis and SMA are patent. Mild lymphadenopathy in the gastrohepatic and hepato duodenal ligaments is not substantially changed. Index gastrohepatic ligament lymph node measures 9 mm short axis on 14/2. 15 mm retrocaval node on 21/2 was 18 mm short axis previously. Left para-aortic node measuring 1.4 cm short axis on 23/2 was 1.4 cm previously. No pelvic sidewall lymphadenopathy. Reproductive: The uterus is unremarkable.  There is no adnexal mass. Other: Trace free fluid noted in the cul-de-sac. Musculoskeletal: Status post left total hip replacement. Interval development of multiple lytic and sclerotic bone lesions consistent with metastatic involvement. 2.8 cm lesion identified left iliac bone on 47/2. New 13 mm lytic and  sclerotic lesion in the left L2 vertebral body noted on 24/2. 14 mm sclerotic lesion in the sacrum visible on 51/2. IMPRESSION: 1. Interval development of multiple lytic and sclerotic bone lesions consistent with bony metastatic involvement. 2. Decrease in intrahepatic biliary duct dilatation with common bile duct stent visualized in situ. 3. Ill-defined hypoenhancing pancreatic head mass measuring 3.0 x 2.6 cm today. 4. New 8 mm hypodensity in the inferior right liver adjacent to the gallbladder. Attention on follow-up recommended. 5. Upper abdominal lymphadenopathy shows no substantial change. 6.  Aortic Atherosclerois (ICD10-170.0) Electronically Signed   By: Misty Stanley M.D.   On: 02/15/2019 10:04   DG C-Arm 1-60 Min-No Report  Result Date: 01/28/2019 Fluoroscopy was utilized by the requesting physician.  No radiographic interpretation.    [facility] labs CA 19-9 was check on 10/21/2018, level was elevated at 1500.    ASSESSMENT & PLAN:  1. Malignant neoplasm of head of pancreas ([place])   2. Swelling of lower leg   3. Hydronephrosis, unspecified hydronephrosis type   4. Normocytic anemia   5. Encounter for antineoplastic chemotherapy   6. Neuropathy   . # Stage IV pancreatic cancer-7Labs are reviewed and discussed with patient CA19.9 1500--> 2101-->1614-->  897-->940--> 1972-->3374--> 6032 Labs reviewed and discussed with patient. Her tumor marker continues to go up. Interval CT chest abdomen pelvis image and a bone scan images were reviewed and discussed with patient .  Unfortunately patient has developed mild progression of her metastatic disease. Left retrocrural/aortocaval lymphadenopathy has mildly increased.  Upper omental soft tissue nodule or increased. There is a new liver lesion along with 2 other stable size liver lesion. Pancreatic head fullness and heterogenicity unchanged.  CBD stent is in good position. There is a new moderate to severe right hydroureteronephrosis to  the level of lower right lumbar ureter.  Findings are indeterminate.  Questionable malignant right ureteral stricture. Sclerosis osseous metastasis.  #Goals of care, discussed with patient that her prognosis is poor.  Unfortunately first-line chemotherapy only controlled her disease for about 4 months.  Patient has developed radiographic disease progression, which is consistent with progressively increasing of tumor marker.  I recommend switch to second line chemotherapy treatments. Patient wishes to continue care with her cancer aggressively.  I discussed about using 5-FU with liposomal irinotecan as second line treatment. Her previous biopsy specimen did not have enough tissue for NGS. I sent liquid biopsy which did not show any reportable alteration.  Blood tumor burden 30mt/mb I explained to the patient  the risks  5-FU with liposomal irinotecan including all but not limited to infusion reaction, hair loss, hearing loss, mouth sore, nausea, vomiting, diarrhea, low blood counts, bleeding, heart failure, kidney failure and risk of life threatening infection and even death etc.   Patient voices understanding and willing to proceed chemotherapy.  Given that patient will be starting elective radiation to his back which may cause additional marrow suppression, I will give first dose of 5-FU and dose reduced liposomal Irinotecan to 19m/m for this cycle. Patient was instructed to take dexamethasone for 2 days after chemotherapy and also use Imodium as instructed if she develops diarrhea.  She voices understanding. Patient will get IV fluids 500 cc x 1 when she presents on day 3 for pump discontinuation.   #Worsening of bone metastasis, patient will start Zometa monthly.  Last Zometa 03/22/2019. Follow-up with radiation oncology for palliative radiation. #Neoplasm related pain/back pain Continue oxycodone 5 mg every 6 hours as needed for pain.  #Neuropathy, grade 1/2.  Currently on gabapentin 100 mg 3  times daily. #Weight loss, weight has been stable Follow-up with palliative care..Marland Kitchen#Discussed about genetic testing.  Patient has been referred to genetic testing.   #Bilateral lower extremity swelling, etiology unknown.  Check ultrasound lower extremity to rule out DVT.  All questions were answered. The patient knows to call the clinic with any problems questions or concerns.  cc KAdin Hector MD   Return of visit: 1 week We spent sufficient time to discuss many aspect of care, questions were answered to patient's satisfaction.  ZEarlie Server MD, PhD [facility] [facility]19[facility]2021

## 2019-04-05 NOTE — Progress Notes (Signed)
Patient here for slow. Pt feeling weak and tired. Pt has increased swelling to both feet and ankles.

## 2019-04-06 ENCOUNTER — Other Ambulatory Visit: Payer: Self-pay

## 2019-04-06 ENCOUNTER — Ambulatory Visit
Admission: RE | Admit: 2019-04-06 | Discharge: 2019-04-06 | Disposition: A | Discharge status: Home / Self Care | Payer: Medicare Other | Source: Ambulatory Visit | Attending: Radiation Oncology | Admitting: Radiation Oncology

## 2019-04-06 ENCOUNTER — Telehealth: Payer: Self-pay

## 2019-04-06 DIAGNOSIS — Z510 Encounter for antineoplastic radiation therapy: Secondary | ICD-10-CM | POA: Diagnosis not present

## 2019-04-06 LAB — CANCER ANTIGEN 19-9: CA 19-9: 5285 U/mL — ABNORMAL HIGH (ref 0–35)

## 2019-04-06 NOTE — Telephone Encounter (Signed)
-----   Message from Earlie Server, MD sent at 04/05/2019 10:23 PM EST ----- Please advise patient to try compression stocking. No DVT.

## 2019-04-06 NOTE — Telephone Encounter (Signed)
Patient informed. 

## 2019-04-07 ENCOUNTER — Inpatient Hospital Stay: Payer: Medicare Other

## 2019-04-07 ENCOUNTER — Other Ambulatory Visit: Payer: Self-pay

## 2019-04-07 ENCOUNTER — Ambulatory Visit
Admission: RE | Admit: 2019-04-07 | Discharge: 2019-04-07 | Disposition: A | Discharge status: Home / Self Care | Payer: Medicare Other | Source: Ambulatory Visit | Attending: Radiation Oncology | Admitting: Radiation Oncology

## 2019-04-07 VITALS — BP 120/63 | HR 79 | Temp 97.7°F | Resp 19

## 2019-04-07 DIAGNOSIS — Z5111 Encounter for antineoplastic chemotherapy: Secondary | ICD-10-CM | POA: Diagnosis not present

## 2019-04-07 DIAGNOSIS — D490 Neoplasm of unspecified behavior of digestive system: Secondary | ICD-10-CM

## 2019-04-07 DIAGNOSIS — Z510 Encounter for antineoplastic radiation therapy: Secondary | ICD-10-CM | POA: Diagnosis not present

## 2019-04-07 MED ORDER — SODIUM CHLORIDE 0.9 % IV SOLN
Freq: Once | INTRAVENOUS | Status: AC
  Filled 2019-04-07: qty 250

## 2019-04-07 MED ORDER — HEPARIN SOD (PORK) LOCK FLUSH 100 UNIT/ML IV SOLN
500.0000 [IU] | Freq: Once | INTRAVENOUS | Status: AC | PRN
  Administered 2019-04-07: 15:00:00 500 [IU]
  Filled 2019-04-07: qty 5

## 2019-04-08 ENCOUNTER — Other Ambulatory Visit: Payer: Self-pay

## 2019-04-08 ENCOUNTER — Ambulatory Visit
Admission: RE | Admit: 2019-04-08 | Discharge: 2019-04-08 | Disposition: A | Discharge status: Home / Self Care | Payer: Medicare Other | Source: Ambulatory Visit | Attending: Radiation Oncology | Admitting: Radiation Oncology

## 2019-04-08 DIAGNOSIS — Z510 Encounter for antineoplastic radiation therapy: Secondary | ICD-10-CM | POA: Diagnosis not present

## 2019-04-11 ENCOUNTER — Ambulatory Visit
Admission: RE | Admit: 2019-04-11 | Discharge: 2019-04-11 | Disposition: A | Discharge status: Home / Self Care | Payer: Medicare Other | Source: Ambulatory Visit | Attending: Radiation Oncology | Admitting: Radiation Oncology

## 2019-04-11 ENCOUNTER — Other Ambulatory Visit: Payer: Self-pay

## 2019-04-11 DIAGNOSIS — Z510 Encounter for antineoplastic radiation therapy: Secondary | ICD-10-CM | POA: Diagnosis not present

## 2019-04-12 ENCOUNTER — Other Ambulatory Visit: Payer: Self-pay

## 2019-04-12 ENCOUNTER — Ambulatory Visit
Admission: RE | Admit: 2019-04-12 | Discharge: 2019-04-12 | Disposition: A | Discharge status: Home / Self Care | Payer: Medicare Other | Source: Ambulatory Visit | Attending: Radiation Oncology | Admitting: Radiation Oncology

## 2019-04-12 DIAGNOSIS — Z510 Encounter for antineoplastic radiation therapy: Secondary | ICD-10-CM | POA: Diagnosis not present

## 2019-04-12 NOTE — Progress Notes (Signed)
Patient pre screened for office appointment, no questions or concerns today. Patient reminded of upcoming appointment time and date. 

## 2019-04-13 ENCOUNTER — Ambulatory Visit
Admission: RE | Admit: 2019-04-13 | Discharge: 2019-04-13 | Disposition: A | Discharge status: Home / Self Care | Payer: Medicare Other | Source: Ambulatory Visit | Attending: Radiation Oncology | Admitting: Radiation Oncology

## 2019-04-13 ENCOUNTER — Encounter: Payer: Self-pay | Admitting: Oncology

## 2019-04-13 ENCOUNTER — Inpatient Hospital Stay (HOSPITAL_BASED_OUTPATIENT_CLINIC_OR_DEPARTMENT_OTHER): Payer: Medicare Other | Admitting: Oncology

## 2019-04-13 ENCOUNTER — Inpatient Hospital Stay: Payer: Medicare Other

## 2019-04-13 ENCOUNTER — Other Ambulatory Visit: Payer: Self-pay

## 2019-04-13 VITALS — BP 118/64 | HR 70 | Temp 96.1°F | Resp 18 | Wt 139.4 lb

## 2019-04-13 DIAGNOSIS — D490 Neoplasm of unspecified behavior of digestive system: Secondary | ICD-10-CM

## 2019-04-13 DIAGNOSIS — D649 Anemia, unspecified: Secondary | ICD-10-CM | POA: Diagnosis not present

## 2019-04-13 DIAGNOSIS — Z510 Encounter for antineoplastic radiation therapy: Secondary | ICD-10-CM | POA: Diagnosis not present

## 2019-04-13 DIAGNOSIS — R197 Diarrhea, unspecified: Secondary | ICD-10-CM | POA: Diagnosis not present

## 2019-04-13 DIAGNOSIS — E876 Hypokalemia: Secondary | ICD-10-CM

## 2019-04-13 DIAGNOSIS — C250 Malignant neoplasm of head of pancreas: Secondary | ICD-10-CM

## 2019-04-13 DIAGNOSIS — R7989 Other specified abnormal findings of blood chemistry: Secondary | ICD-10-CM

## 2019-04-13 DIAGNOSIS — G629 Polyneuropathy, unspecified: Secondary | ICD-10-CM | POA: Diagnosis not present

## 2019-04-13 DIAGNOSIS — Z5111 Encounter for antineoplastic chemotherapy: Secondary | ICD-10-CM | POA: Diagnosis not present

## 2019-04-13 LAB — CBC WITH DIFFERENTIAL/PLATELET
Abs Immature Granulocytes: 0.07 10*3/uL (ref 0.00–0.07)
Basophils Absolute: 0 10*3/uL (ref 0.0–0.1)
Basophils Relative: 0 %
Eosinophils Absolute: 0.3 10*3/uL (ref 0.0–0.5)
Eosinophils Relative: 4 %
HCT: 32.2 % — ABNORMAL LOW (ref 36.0–46.0)
Hemoglobin: 10.6 g/dL — ABNORMAL LOW (ref 12.0–15.0)
Immature Granulocytes: 1 %
Lymphocytes Relative: 3 %
Lymphs Abs: 0.2 10*3/uL — ABNORMAL LOW (ref 0.7–4.0)
MCH: 35.1 pg — ABNORMAL HIGH (ref 26.0–34.0)
MCHC: 32.9 g/dL (ref 30.0–36.0)
MCV: 106.6 fL — ABNORMAL HIGH (ref 80.0–100.0)
Monocytes Absolute: 0.4 10*3/uL (ref 0.1–1.0)
Monocytes Relative: 7 %
Neutro Abs: 5.4 10*3/uL (ref 1.7–7.7)
Neutrophils Relative %: 85 %
Platelets: 258 10*3/uL (ref 150–400)
RBC: 3.02 MIL/uL — ABNORMAL LOW (ref 3.87–5.11)
RDW: 15.1 % (ref 11.5–15.5)
WBC: 6.4 10*3/uL (ref 4.0–10.5)
nRBC: 0.3 % — ABNORMAL HIGH (ref 0.0–0.2)

## 2019-04-13 LAB — COMPREHENSIVE METABOLIC PANEL
ALT: 22 U/L (ref 0–44)
AST: 26 U/L (ref 15–41)
Albumin: 2.9 g/dL — ABNORMAL LOW (ref 3.5–5.0)
Alkaline Phosphatase: 366 U/L — ABNORMAL HIGH (ref 38–126)
Anion gap: 7 (ref 5–15)
BUN: 17 mg/dL (ref 8–23)
CO2: 25 mmol/L (ref 22–32)
Calcium: 8.3 mg/dL — ABNORMAL LOW (ref 8.9–10.3)
Chloride: 103 mmol/L (ref 98–111)
Creatinine, Ser: 0.68 mg/dL (ref 0.44–1.00)
GFR calc Af Amer: >60 mL/min (ref >60)
GFR calc non Af Amer: >60 mL/min (ref >60)
Glucose, Bld: 129 mg/dL — ABNORMAL HIGH (ref 70–99)
Potassium: 3.9 mmol/L (ref 3.5–5.1)
Sodium: 135 mmol/L (ref 135–145)
Total Bilirubin: 0.7 mg/dL (ref 0.3–1.2)
Total Protein: 6.7 g/dL (ref 6.5–8.1)

## 2019-04-13 LAB — VITAMIN B12: Vitamin B-12: 477 pg/mL (ref 180–914)

## 2019-04-13 LAB — IRON AND TIBC
Iron: 117 ug/dL (ref 28–170)
Saturation Ratios: 53 % — ABNORMAL HIGH (ref 10.4–31.8)
TIBC: 223 ug/dL — ABNORMAL LOW (ref 250–450)
UIBC: 106 ug/dL

## 2019-04-13 LAB — FOLATE: Folate: 18 ng/mL (ref >5.9)

## 2019-04-13 LAB — FERRITIN: Ferritin: 1108 ng/mL — ABNORMAL HIGH (ref 11–307)

## 2019-04-13 MED ORDER — HEPARIN SOD (PORK) LOCK FLUSH 100 UNIT/ML IV SOLN
500.0000 [IU] | Freq: Once | INTRAVENOUS | Status: AC
  Administered 2019-04-13: 500 [IU] via INTRAVENOUS
  Filled 2019-04-13: qty 5

## 2019-04-13 MED ORDER — SODIUM CHLORIDE 0.9% FLUSH
10.0000 mL | INTRAVENOUS | Status: DC | PRN
  Administered 2019-04-13: 10 mL via INTRAVENOUS
  Filled 2019-04-13: qty 10

## 2019-04-13 MED ORDER — SODIUM CHLORIDE 0.9 % IV SOLN
Freq: Once | INTRAVENOUS | Status: AC
  Filled 2019-04-13: qty 250

## 2019-04-13 MED ORDER — HEPARIN SOD (PORK) LOCK FLUSH 100 UNIT/ML IV SOLN
INTRAVENOUS | Status: AC
  Filled 2019-04-13: qty 5

## 2019-04-13 NOTE — Progress Notes (Signed)
Patient here for follow up. Pt reports that she had a very dark/black stool on Monday.

## 2019-04-13 NOTE — Progress Notes (Signed)
Hematology/Oncology follow up note [facility] [phone] [phone]   Patient Care Team: Melissa Hector, MD as PCP - General (Internal Medicine) Clent Jacks, RN as Oncology Nurse Navigator  REFERRING PROVIDER: Adin Hector, MD  CHIEF COMPLAINTS/REASON FOR VISIT:  Follow-up for pancreatic cancer   HISTORY OF PRESENTING ILLNESS:   Melissa [name] is a  79 y.o.  female with PMH listed below was seen in consultation at the request of  Melissa Hector, MD  for evaluation of abnormal pancreatic disease. Patient was recently seen by primary care provider Dr. Caryl [name] for evaluation of nausea, diarrhea and jaundice.  Blood work on 10/19/2018 showed potassium 2.9, bilirubin 15, alkaline phosphatase 478, AST 114, ALT 95. Abdomen pelvis CT scan on 10/20/2018 showed moderate to market intra-and extra hepatic biliary dilation with mild diffuse dilatation of the pancreatic duct. Patient has had poor appetite and has lost a 5 to 10 pounds recently. She also had diarrhea.  She noticed sand like stool for 1- 2 weeks.  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  She placed her mother to assisted living yesterday. She has had discussion with Dr. Caryl [name] about her blood work and CT scans.  She understands that there is a strong suspicion of cancer.  Nonessential medication has been stopped. CA 19-9 was check on 10/21/2018, level was elevated at 1500.  Today she denies any pain.  Itchy all over her body.. Per patient's request, patient's daughter Melissa [name] was Melissa [name] and Melissa [name] was able to hear entire clinical encounter conversation and participate in the reported history and discussion.  # Patient was admitted from 10/29/2018-10/31/2018.  Status post ERCP and biliary stenting.  Patient had duodenum ampulla biopsy showed nondiagnostic.  Negative for  invasive carcinoma. Bilirubin trended down to 11 at the time of discharge on 10/31/2018.  #PET scan images were independent reviewed by me and discussed with patient. cTxN2 M1a Patient has at least M1 a disease due to retroperitoneal/peri-aortic lymph node involvement,  sclerotic lesion of right proximal femur with mild hypermetabolic activity, as well as left sacrum  # EUS biopsy of pancreatic mass showed positive for malignancy, Adenocarcinoma. There is not enough tissue for NGS testing.  Attempt to at MMR, still not enough tissue to complete the testing.  # liquid biopsy sent on 03/22/2019- no reportable alteration  blood tumor burden 8mts/mb  INTERVAL HISTORY Melissa KLINGBEILis a 79y.o. female who has above history reviewed by me today presents for follow up visit for management of stage IV pancreatic cancer, follow-up for tolerability of second line chemotherapy with 5-FU and liposomal irinotecan. Patient reports a few episodes of dark loose stool 2 days ago.  Yesterday she had one episode of loose stool.  She did not take Imodium.  Today she has not had any bowel movement. Appetite is poor. She has lost 2 pounds since last visit.  Chronic fatigue slightly worse. Bilateral lower extremity  swelling has improved. Started palliative radiation to her spine.  Review of Systems  Constitutional: Positive for fatigue. Negative for appetite change, chills and fever.  HENT:   Negative for hearing loss and voice change.   Eyes: Negative for eye problems and icterus.  Respiratory: Negative for chest tightness and cough.   Cardiovascular: Positive for leg swelling. Negative for chest pain.  Gastrointestinal: Negative for abdominal distention,  abdominal pain and blood in stool.  Endocrine: Negative for hot flashes.  Genitourinary: Negative for difficulty urinating and frequency.   Musculoskeletal: Positive for back pain. Negative for arthralgias.  Skin: Negative for itching and rash.    Neurological: Positive for numbness. Negative for extremity weakness.  Hematological: Negative for adenopathy.  Psychiatric/Behavioral: Negative for confusion.    MEDICAL HISTORY:  Past Medical History:  Diagnosis Date  . Glaucoma   . Hypertension   . Osteopenia   . Stroke [facility])     SURGICAL HISTORY: Past Surgical History:  Procedure Laterality Date  . BREAST BIOPSY Right    core- stereo- neg  . ERCP N/A 10/29/2018   Procedure: ENDOSCOPIC RETROGRADE CHOLANGIOPANCREATOGRAPHY (ERCP);  Surgeon: [name], MD;  Location: [facility] ENDOSCOPY;  Service: Endoscopy;  Laterality: N/A;  . ERCP N/A 01/28/2019   Procedure: ENDOSCOPIC RETROGRADE CHOLANGIOPANCREATOGRAPHY (ERCP) STENT REMOVAL;  Surgeon: [name], MD;  Location: [facility] ENDOSCOPY;  Service: Endoscopy;  Laterality: N/A;  . EUS N/A 11/18/2018   Procedure: FULL UPPER ENDOSCOPIC ULTRASOUND (EUS) RADIAL;  Surgeon: Holly Bodily, MD;  Location: [facility] ENDOSCOPY;  Service: Gastroenterology;  Laterality: N/A;  . HIP ARTHROPLASTY Left 01/02/2016   Procedure: ARTHROPLASTY BIPOLAR HIP (HEMIARTHROPLASTY);  Surgeon: Dereck Leep, MD;  Location: [facility] ORS;  Service: Orthopedics;  Laterality: Left;  . JOINT REPLACEMENT Left    THR  . PORTACATH PLACEMENT Right 11/26/2018   Procedure: INSERTION PORT-A-CATH;  Surgeon: Jules Husbands, MD;  Location: [facility] ORS;  Service: General;  Laterality: Right;  . TONSILLECTOMY      SOCIAL HISTORY: Social History   Socioeconomic History  . Marital status: Married    Spouse name: Not on file  . Number of children: Not on file  . Years of education: Not on file  . Highest education level: Not on file  Occupational History  . Not on file  Tobacco Use  . Smoking status: Never Smoker  . Smokeless tobacco: Never Used  Substance and Sexual Activity  . Alcohol use: No  . Drug use: Never  . Sexual activity: Not on file  Other Topics Concern  . Not on file  Social History Narrative  . Not on file    Social Determinants of Health   Financial Resource Strain:   . Difficulty of Paying Living Expenses: Not on file  Food Insecurity:   . Worried About Charity fundraiser in the Last Year: Not on file  . Ran Out of Food in the Last Year: Not on file  Transportation Needs:   . Lack of Transportation (Medical): Not on file  . Lack of Transportation (Non-Medical): Not on file  Physical Activity:   . Days of Exercise per Week: Not on file  . Minutes of Exercise per Session: Not on file  Stress:   . Feeling of Stress : Not on file  Social Connections:   . Frequency of Communication with Friends and Family: Not on file  . Frequency of Social Gatherings with Friends and Family: Not on file  . Attends Religious Services: Not on file  . Active Member of Clubs or Organizations: Not on file  . Attends Archivist Meetings: Not on file  . Marital Status: Not on file  Intimate Partner Violence:   . Fear of Current or Ex-Partner: Not on file  . Emotionally Abused: Not on file  . Physically Abused: Not on file  . Sexually Abused: Not on file    FAMILY HISTORY: Family History  Problem  Relation Age of Onset  . Kidney disease Father   . Breast cancer Neg Hx     ALLERGIES:  has No Known Allergies.  MEDICATIONS:  Current Outpatient Medications  Medication Sig Dispense Refill  . acetaminophen (TYLENOL) 500 MG tablet Take 500 mg by mouth every 6 (six) hours as needed.    Marland Kitchen amLODipine (NORVASC) 10 MG tablet Take 10 mg by mouth at bedtime.     Marland Kitchen dexamethasone (DECADRON) 4 MG tablet Take 2 tablets (8 mg total) by mouth daily. Start the day after irinotecan chemotherapy for 2 days. 8 tablet 5  . docusate sodium (COLACE) 100 MG capsule Take 1 capsule (100 mg total) by mouth daily as needed for mild constipation or moderate constipation. Do not take if you have loose stools 60 capsule 2  . gabapentin (NEURONTIN) 300 MG capsule Take 1 capsule (300 mg total) by mouth 2 (two) times daily. 60  capsule 0  . lidocaine-prilocaine (EMLA) cream Apply to affected area once 30 g 3  . loperamide (IMODIUM A-D) 2 MG tablet Take 1 tablet (2 mg total) by mouth See admin instructions. Take 2 at diarrhea onset , then 1 every 2hr until 12hrs with no BM. May take 2 every 4hrs at night. If diarrhea recurs repeat. 100 tablet 1  . ondansetron (ZOFRAN) 8 MG tablet Take 1 tablet by mouth as needed.    Marland Kitchen oxyCODONE (ROXICODONE) 5 MG immediate release tablet Take 1 tablet (5 mg total) by mouth every 6 (six) hours as needed for moderate pain or severe pain. 60 tablet 0  . potassium chloride (K-DUR) 10 MEQ tablet Take 1 tablet (10 mEq total) by mouth daily. (Patient taking differently: Take 10 mEq by mouth 2 (two) times daily. ) 30 tablet 0  . prochlorperazine (COMPAZINE) 10 MG tablet Take 1 tablet (10 mg total) by mouth every 6 (six) hours as needed (NAUSEA). 30 tablet 1   No current facility-administered medications for this visit.     PHYSICAL EXAMINATION: ECOG PERFORMANCE STATUS: 2 - Symptomatic, <50% confined to bed Vitals:   04/13/19 1020  BP: 118/64  Pulse: 70  Resp: 18  Temp: (!) 96.1 F (35.6 C)   Filed Weights   04/13/19 1020  Weight: 139 lb 6.4 oz (63.2 kg)    Physical Exam Constitutional:      General: She is not in acute distress.    Appearance: She is ill-appearing.     Comments: Frail, sits in wheelchair.  HENT:     Head: Normocephalic and atraumatic.     Comments: Alopecia Eyes:     General: No scleral icterus.    Pupils: Pupils are equal, round, and reactive to light.  Cardiovascular:     Rate and Rhythm: Normal rate and regular rhythm.     Heart sounds: Normal heart sounds.  Pulmonary:     Effort: Pulmonary effort is normal. No respiratory distress.     Breath sounds: Normal breath sounds. No wheezing.  Abdominal:     General: Bowel sounds are normal. There is no distension.     Palpations: Abdomen is soft. There is no mass.     Tenderness: There is no abdominal  tenderness.  Musculoskeletal:        General: No deformity. Normal range of motion.     Cervical back: Normal range of motion and neck supple.  Skin:    General: Skin is warm and dry.     Coloration: Skin is not jaundiced.  Findings: No erythema or rash.  Neurological:     Mental Status: She is alert and oriented to person, place, and time. Mental status is at baseline.     Cranial Nerves: No cranial nerve deficit.     Coordination: Coordination normal.  Psychiatric:        Mood and Affect: Mood normal.     LABORATORY DATA:  I have reviewed the data as listed Lab Results  Component Value Date   WBC 6.4 04/13/2019   HGB 10.6 (L) 04/13/2019   HCT 32.2 (L) 04/13/2019   MCV 106.6 (H) 04/13/2019   PLT 258 04/13/2019   Recent Labs    10/29/18 1318 10/30/18 0531 03/22/19 1107 04/05/19 0851 04/13/19 0943  NA 135   < > 138 133* 135  K 3.0*   < > 4.1 4.0 3.9  CL 104   < > 104 101 103  CO2 21*   < > '25 22 25  ' GLUCOSE 110*   < > 124* 171* 129*  BUN 13   < > '13 16 17  ' CREATININE 0.32*   < > 0.63 0.67 0.68  CALCIUM 9.0   < > 9.6 8.2* 8.3*  GFRNONAA >60   < > >60 >60 >60  GFRAA >60   < > >60 >60 >60  PROT 7.0   < > 7.5 7.7 6.7  ALBUMIN 2.6*   < > 3.3* 3.1* 2.9*  AST 119*   < > 41 30 26  ALT 79*   < > '23 16 22  ' ALKPHOS 399*   < > 382* 599* 366*  BILITOT 18.2*   < > 0.5 0.8 0.7  BILIDIR 10.2*  --   --   --   --    < > = values in this interval not displayed.   Iron/TIBC/Ferritin/ %Sat    Component Value Date/Time   IRON 117 04/13/2019 0951   TIBC 223 (L) 04/13/2019 0951   FERRITIN 1,108 (H) 04/13/2019 0951   IRONPCTSAT 53 (H) 04/13/2019 7371     RADIOGRAPHIC STUDIES: I have personally reviewed the radiological images as listed and agreed with the findings in the report. 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 Restaging. EXAM: CT CHEST, ABDOMEN, AND PELVIS WITH  CONTRAST TECHNIQUE: Multidetector CT imaging of the chest, abdomen and pelvis was performed following the standard protocol during bolus administration of intravenous contrast. CONTRAST:  160m OMNIPAQUE IOHEXOL 300 MG/ML  SOLN COMPARISON:  02/15/2019 CT abdomen/pelvis.  11/09/2018 PET-CT. FINDINGS: CT CHEST FINDINGS Cardiovascular: Top-normal heart size. Right internal jugular Port-A-Cath terminates at the cavoatrial junction. No significant pericardial effusion/thickening. Atherosclerotic nonaneurysmal thoracic aorta. Normal caliber pulmonary arteries. No central pulmonary emboli. Mediastinum/Nodes: Heterogeneous thyroid gland with subcentimeter bilateral thyroid nodules requiring no follow-up. Unremarkable esophagus. No axillary or hilar adenopathy. Mildly enlarged 1.0 cm lower right paraesophageal node (series 2/image 34), stable since 11/09/2018 PET-CT. Left retrocrural enlarged 1.4 cm node (series 2/image 46), increased from 0.9 cm on 02/15/2019 CT abdomen. No additional pathologically enlarged mediastinal nodes. Lungs/Pleura: No pneumothorax. No pleural effusion. Anterior right middle lobe 3 mm solid pulmonary nodule (series 4/image 80) is stable since 11/09/2018 PET-CT. Mild platelike scarring versus atelectasis at both lung bases. No acute consolidative airspace disease, lung masses or new significant pulmonary nodules. Musculoskeletal: Several new sclerotic lesions throughout thoracic osseous structures including the sternum, bilateral ribs, left superior scapula, left clavicle and thoracic spine, new since 11/09/2018 PET-CT.  Moderate thoracic spondylosis. CT ABDOMEN PELVIS FINDINGS Hepatobiliary: Normal liver size. Simple lateral segment left liver lobe 1.0 cm cyst is stable. Subcentimeter hypodense segment 4B left liver lobe lesion is new (series 2/image 57). Subcentimeter hypodense segment 6 right liver lesion (series 2/image 58) is stable. Subcentimeter segment 8 right liver dome hypodense lesion  (series 2/image 39) is stable. No additional new liver lesions. Numerous calcified gallstones throughout the gallbladder with stable mild diffuse gallbladder wall thickening. Pneumobilia in the intrahepatic left liver lobe bile ducts with stable mild central intrahepatic biliary ductal dilatation. CBD stent well positioned with distal tip in the duodenal lumen just beyond the ampulla. Pneumobilia throughout the stent lumen. Pancreas: Stable mild fullness of the pancreatic head with an distinct surrounding soft tissue planes and no discrete measurable pancreatic mass by CT. Stable mild diffuse pancreatic duct dilation. No new pancreatic lesions. Spleen: Normal size. No mass. Adrenals/Urinary Tract: Normal adrenals. New moderate to severe right hydroureteronephrosis to the level of the lower right lumbar ureter without discrete obstructing stone or mass, although with mild urothelial wall thickening at the site of caliber transition of the right ureter (series 2/image 86). No left hydronephrosis. No renal masses. Bladder nondistended and obscured by streak artifact from left hip hardware. Stomach/Bowel: Small hiatal hernia. Otherwise normal nondistended stomach. Normal caliber small bowel with no small bowel wall thickening. Normal appendix. Mild sigmoid diverticulosis, with no large bowel wall thickening or acute pericolonic fat stranding. Vascular/Lymphatic: Atherosclerotic nonaneurysmal abdominal aorta. Patent portal, splenic, hepatic and renal veins. Stable mildly enlarged 1.0 cm gastrohepatic ligament lymph node (series 2/image 48). Enlarged 1.4 cm porta hepatis node (series 2/image 51), previously 1.3 cm on 02/15/2019 CT, not appreciably changed. Stable enlarged 1.3 cm portacaval node (series 2/image 58). Enlarged 2.2 cm aortocaval node (series 2/image 53), previously 1.9 cm, mildly increased. Enlarged 1.7 cm left para-aortic node (series 2/image 60), previously 1.6 cm, not substantially changed. No  pathologically enlarged pelvic lymph nodes. Reproductive: Grossly normal uterus.  No adnexal mass. Other: No pneumoperitoneum, ascites or focal fluid collection. Upper omental 1.3 cm soft tissue nodule (series 2/image 68), previously a 1.1 cm, slightly increased. Adjacent 0.7 cm upper omental soft tissue nodule (series 2/image 67), previously 0.5 cm, slightly increased. Small fat containing umbilical hernia. Musculoskeletal: Several sclerotic lesions scattered throughout the lumbar spine, upper sacrum, bilateral iliac bones and right pubic rami are unchanged since 02/15/2019 CT. No new osseous lesions. Left total hip arthroplasty. Mild lumbar spondylosis. IMPRESSION: 1. Findings suggest mild progression of metastatic disease. 2. Left retrocrural and aortocaval lymphadenopathy is mildly increased. Lower right paraesophageal, gastrohepatic ligament, porta hepatis, portacaval and left para-aortic lymphadenopathy is stable. 3. Upper omental soft tissue nodules are slightly increased and likely represent peritoneal metastases. 4. Three scattered subcentimeter hypodense liver lesions, one of which is new and two of which are stable, suspicious for liver metastases. 5. 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 Well-positioned CBD stent with pneumobilia indicating stent patency. 6. 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 No discrete stone or mass, although there is mild urothelial wall thickening at the site of caliber transition in the right ureter. Findings are indeterminate for benign treatment related or malignant right ureteral stricture. 7. 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 Sclerotic osseous metastases throughout the chest are new since most recent comparison chest imaging of 11/09/2018 PET-CT,  compatible with response to therapy. 8. Aortic Atherosclerosis (ICD10-I70.0). Additional chronic findings as detailed. Electronically Signed   By: Ilona Sorrel M.D.   On: 03/31/2019 13:25   MR Lumbar Spine W Wo Contrast  Result Date: 03/10/2019 CLINICAL DATA:  Pancreatic cancer. Osseous metastases on CT. EXAM: MRI LUMBAR SPINE WITHOUT AND WITH CONTRAST TECHNIQUE: Multiplanar and multiecho pulse sequences of the lumbar spine were obtained without and with intravenous contrast. CONTRAST:  65m GADAVIST GADOBUTROL 1 MMOL/ML IV SOLN COMPARISON:  Abdominopelvic CT 02/15/2019. PET-CT 11/09/2018. FINDINGS: Segmentation: Conventional anatomy assumed, with the last open disc space designated L5-S1.Concordant with previous imaging. Alignment: Stable slight degenerative anterolisthesis at L3-4. Vertebrae: As seen on the recent CT, there is widespread osseous metastatic disease to the lumbar spine, sacrum and both iliac bones. Representative lesions on series 8 include a 2.2 cm lesion in the L2 vertebral body (image 12), a 2.4 cm lesion in the L4 vertebral body (image 26) and a 2.8 cm left iliac lesion on image 35. Stable mild superior endplate compression deformity at L1 without residual marrow edema. No evidence of acute pathologic fracture. These osseous metastases enhance following contrast. Conus medullaris: Extends to the L1 level and appears normal. No abnormal intradural enhancement or significant epidural tumor. Paraspinal and other soft tissues: There are multiple enlarged retroperitoneal lymph nodes consistent with nodal metastases. These are grossly stable compared with the recent CT, measuring up to 1.6 cm short axis in the aortocaval space (image 10/8). Disc levels: No significant disc space findings at T12-L1 or L1-2. L2-3: Mild disc bulging eccentric to the left, facet and ligamentous hypertrophy. Borderline spinal stenosis with mild left foraminal narrowing. L3-4: Loss of disc height with annular disc  bulging. There is moderate facet and ligamentous hypertrophy accounting for the grade 1 anterolisthesis. These factors contribute to severe spinal stenosis with moderate narrowing of both lateral recesses and mild left foraminal narrowing. L4-5: Relatively preserved disc height. Mild disc bulging with moderate facet and ligamentous hypertrophy. Mild spinal stenosis. No nerve root encroachment. L5-S1: Mild disc bulging with moderate asymmetric right-sided facet hypertrophy. No significant spinal stenosis or nerve root encroachment. IMPRESSION: 1. 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 No evidence of acute pathologic fracture. 2. Stable mild superior endplate compression deformity at L1. No residual marrow edema. 3. Stable retroperitoneal nodal metastases. 4. Severe multifactorial spinal stenosis at L3-4 with moderate narrowing of both lateral recesses and mild left foraminal narrowing. 5. Stable mild multifactorial spinal stenosis at L2-3 and L4-5. Electronically Signed   By: WRichardean SaleM.D.   On: 03/10/2019 20:13   NM Bone Scan Whole Body  Result Date: 03/31/2019 CLINICAL DATA:  Stage IV pancreatic cancer EXAM: NUCLEAR MEDICINE WHOLE BODY BONE SCAN TECHNIQUE: Whole body anterior and posterior images were obtained approximately 3 hours after intravenous injection of radiopharmaceutical. RADIOPHARMACEUTICALS:  22.989 mCi Technetium-981mDP IV COMPARISON:  CT chest abdomen pelvis 03/31/2019, PET-CT 11/09/2018 FINDINGS: Numerous sites of abnormal osseous tracer accumulation are identified consistent with widespread osseous metastatic disease. These include calvaria, BILATERAL ribs, sternum, thoracic/lumbar spine, and pelvis. In addition, numerous foci of abnormal uptake are seen within BILATERAL humeri, BILATERAL femora, and proximal forearms likely radius. Many of these are identified on accompanying CT  as well. RIGHT hydronephrosis and hydroureter are identified with abnormal thickening at the mid RIGHT ureter series 2, image 86. LEFT hip prosthesis incidentally noted. IMPRESSION: Widespread osseous metastatic disease including  long bone metastases as above. RIGHT hydronephrosis and proximal hydroureter. Electronically Signed   By: Lavonia Dana M.D.   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 Restaging. EXAM: CT CHEST, ABDOMEN, AND PELVIS WITH CONTRAST TECHNIQUE: Multidetector CT imaging of the chest, abdomen and pelvis was performed following the standard protocol during bolus administration of intravenous contrast. CONTRAST:  157m OMNIPAQUE IOHEXOL 300 MG/ML  SOLN COMPARISON:  02/15/2019 CT abdomen/pelvis.  11/09/2018 PET-CT. FINDINGS: CT CHEST FINDINGS Cardiovascular: Top-normal heart size. Right internal jugular Port-A-Cath terminates at the cavoatrial junction. No significant pericardial effusion/thickening. Atherosclerotic nonaneurysmal thoracic aorta. Normal caliber pulmonary arteries. No central pulmonary emboli. Mediastinum/Nodes: Heterogeneous thyroid gland with subcentimeter bilateral thyroid nodules requiring no follow-up. Unremarkable esophagus. No axillary or hilar adenopathy. Mildly enlarged 1.0 cm lower right paraesophageal node (series 2/image 34), stable since 11/09/2018 PET-CT. Left retrocrural enlarged 1.4 cm node (series 2/image 46), increased from 0.9 cm on 02/15/2019 CT abdomen. No additional pathologically enlarged mediastinal nodes. Lungs/Pleura: No pneumothorax. No pleural effusion. Anterior right middle lobe 3 mm solid pulmonary nodule (series 4/image 80) is stable since 11/09/2018 PET-CT. Mild platelike scarring versus atelectasis at both lung bases. No acute consolidative airspace disease, lung masses or new significant pulmonary nodules.  Musculoskeletal: Several new sclerotic lesions throughout thoracic osseous structures including the sternum, bilateral ribs, left superior scapula, left clavicle and thoracic spine, new since 11/09/2018 PET-CT. Moderate thoracic spondylosis. CT ABDOMEN PELVIS FINDINGS Hepatobiliary: Normal liver size. Simple lateral segment left liver lobe 1.0 cm cyst is stable. Subcentimeter hypodense segment 4B left liver lobe lesion is new (series 2/image 57). Subcentimeter hypodense segment 6 right liver lesion (series 2/image 58) is stable. Subcentimeter segment 8 right liver dome hypodense lesion (series 2/image 39) is stable. No additional new liver lesions. Numerous calcified gallstones throughout the gallbladder with stable mild diffuse gallbladder wall thickening. Pneumobilia in the intrahepatic left liver lobe bile ducts with stable mild central intrahepatic biliary ductal dilatation. CBD stent well positioned with distal tip in the duodenal lumen just beyond the ampulla. Pneumobilia throughout the stent lumen. Pancreas: Stable mild fullness of the pancreatic head with an distinct surrounding soft tissue planes and no discrete measurable pancreatic mass by CT. Stable mild diffuse pancreatic duct dilation. No new pancreatic lesions. Spleen: Normal size. No mass. Adrenals/Urinary Tract: Normal adrenals. New moderate to severe right hydroureteronephrosis to the level of the lower right lumbar ureter without discrete obstructing stone or mass, although with mild urothelial wall thickening at the site of caliber transition of the right ureter (series 2/image 86). No left hydronephrosis. No renal masses. Bladder nondistended and obscured by streak artifact from left hip hardware. Stomach/Bowel: Small hiatal hernia. Otherwise normal nondistended stomach. Normal caliber small bowel with no small bowel wall thickening. Normal appendix. Mild sigmoid diverticulosis, with no large bowel wall thickening or acute pericolonic fat  stranding. Vascular/Lymphatic: Atherosclerotic nonaneurysmal abdominal aorta. Patent portal, splenic, hepatic and renal veins. Stable mildly enlarged 1.0 cm gastrohepatic ligament lymph node (series 2/image 48). Enlarged 1.4 cm porta hepatis node (series 2/image 51), previously 1.3 cm on 02/15/2019 CT, not appreciably changed. Stable enlarged 1.3 cm portacaval node (series 2/image 58). Enlarged 2.2 cm aortocaval node (series 2/image 53), previously 1.9 cm, mildly increased. Enlarged 1.7 cm left para-aortic node (series 2/image 60), previously 1.6 cm, not substantially changed. No pathologically enlarged pelvic lymph nodes. Reproductive: Grossly normal uterus.  No adnexal mass. Other:  No pneumoperitoneum, ascites or focal fluid collection. Upper omental 1.3 cm soft tissue nodule (series 2/image 68), previously a 1.1 cm, slightly increased. Adjacent 0.7 cm upper omental soft tissue nodule (series 2/image 67), previously 0.5 cm, slightly increased. Small fat containing umbilical hernia. Musculoskeletal: Several sclerotic lesions scattered throughout the lumbar spine, upper sacrum, bilateral iliac bones and right pubic rami are unchanged since 02/15/2019 CT. No new osseous lesions. Left total hip arthroplasty. Mild lumbar spondylosis. IMPRESSION: 1. Findings suggest mild progression of metastatic disease. 2. Left retrocrural and aortocaval lymphadenopathy is mildly increased. Lower right paraesophageal, gastrohepatic ligament, porta hepatis, portacaval and left para-aortic lymphadenopathy is stable. 3. Upper omental soft tissue nodules are slightly increased and likely represent peritoneal metastases. 4. Three scattered subcentimeter hypodense liver lesions, one of which is new and two of which are stable, suspicious for liver metastases. 5. 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 Well-positioned CBD stent with  pneumobilia indicating stent patency. 6. 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 No discrete stone or mass, although there is mild urothelial wall thickening at the site of caliber transition in the right ureter. Findings are indeterminate for benign treatment related or malignant right ureteral stricture. 7. 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 Sclerotic osseous metastases throughout the chest are new since most recent comparison chest imaging of 11/09/2018 PET-CT, compatible with response to therapy. 8. Aortic Atherosclerosis (ICD10-I70.0). Additional chronic findings as detailed. Electronically Signed   By: Ilona Sorrel M.D.   On: 03/31/2019 13:25   CT Abdomen Pelvis W Contrast  Result Date: 02/15/2019 CLINICAL DATA:  Pancreatic adenocarcinoma. EXAM: CT ABDOMEN AND PELVIS WITH CONTRAST TECHNIQUE: Multidetector CT imaging of the abdomen and pelvis was performed using the standard protocol following bolus administration of intravenous contrast. CONTRAST:  63m OMNIPAQUE IOHEXOL 300 MG/ML  SOLN COMPARISON:  10/20/2018 FINDINGS: Lower chest: Unremarkable. Hepatobiliary: 8 mm hypodensity in the inferior right liver adjacent to the gallbladder (25/2) is new in the interval. 9 mm hypodensity in the tip of the left liver is stable and likely a cyst. Another 5 mm hypodensity in the dome of the liver is stable. Multiple gallstones evident with gas visible in the gallbladder lumen. Intrahepatic biliary duct dilatation seen previously has decreased with common bile duct stent visualized in situ. Pancreas: Pancreatic parenchymal atrophy noted with dilatation of the main duct in the body and tail of pancreas. No discrete pancreatic mass lesion evident although an ill-defined area of hypoenhancement in the head of the pancreas measures approximately 3 x 2.6 cm today. Spleen: No splenomegaly. No focal mass  lesion. Adrenals/Urinary Tract: No adrenal nodule or mass. Kidneys unremarkable. No evidence for hydroureter. The urinary bladder appears normal for the degree of distention. Stomach/Bowel: Tiny hiatal hernia. Stomach otherwise unremarkable. Duodenum is normally positioned as is the ligament of Treitz. No small bowel wall thickening. No small bowel dilatation. The terminal ileum is normal. The appendix is normal. No gross colonic mass. No colonic wall thickening. Diverticular changes are noted in the left colon without evidence of diverticulitis. Vascular/Lymphatic: There is abdominal aortic atherosclerosis without aneurysm. Portal vein, superior mesenteric vein, and splenic vein are patent. Celiac axis and SMA are patent. Mild lymphadenopathy in the gastrohepatic and hepato duodenal ligaments is not substantially changed. Index gastrohepatic ligament lymph node measures 9 mm short axis on 14/2. 15 mm retrocaval node  on 21/2 was 18 mm short axis previously. Left para-aortic node measuring 1.4 cm short axis on 23/2 was 1.4 cm previously. No pelvic sidewall lymphadenopathy. Reproductive: The uterus is unremarkable.  There is no adnexal mass. Other: Trace free fluid noted in the cul-de-sac. Musculoskeletal: Status post left total hip replacement. Interval development of multiple lytic and sclerotic bone lesions consistent with metastatic involvement. 2.8 cm lesion identified left iliac bone on 47/2. New 13 mm lytic and sclerotic lesion in the left L2 vertebral body noted on 24/2. 14 mm sclerotic lesion in the sacrum visible on 51/2. IMPRESSION: 1. Interval development of multiple lytic and sclerotic bone lesions consistent with bony metastatic involvement. 2. Decrease in intrahepatic biliary duct dilatation with common bile duct stent visualized in situ. 3. Ill-defined hypoenhancing pancreatic head mass measuring 3.0 x 2.6 cm today. 4. New 8 mm hypodensity in the inferior right liver adjacent to the gallbladder.  Attention on follow-up recommended. 5. Upper abdominal lymphadenopathy shows no substantial change. 6.  Aortic Atherosclerois (ICD10-170.0) Electronically Signed   By: Misty Stanley M.D.   On: 02/15/2019 10:04   US Venous Img Lower Bilateral  Result Date: 04/05/2019 CLINICAL DATA:  Acute bilateral lower extremity swelling. EXAM: BILATERAL LOWER EXTREMITY VENOUS DOPPLER ULTRASOUND TECHNIQUE: Gray-scale sonography with graded compression, as well as color Doppler and duplex ultrasound were performed to evaluate the lower extremity deep venous systems from the level of the common femoral vein and including the common femoral, femoral, profunda femoral, popliteal and calf veins including the posterior tibial, peroneal and gastrocnemius veins when visible. The superficial great saphenous vein was also interrogated. Spectral Doppler was utilized to evaluate flow at rest and with distal augmentation maneuvers in the common femoral, femoral and popliteal veins. COMPARISON:  None. FINDINGS: RIGHT LOWER EXTREMITY Common Femoral Vein: No evidence of thrombus. Normal compressibility, respiratory phasicity and response to augmentation. Saphenofemoral Junction: No evidence of thrombus. Normal compressibility and flow on color Doppler imaging. Profunda Femoral Vein: No evidence of thrombus. Normal compressibility and flow on color Doppler imaging. Femoral Vein: No evidence of thrombus. Normal compressibility, respiratory phasicity and response to augmentation. Popliteal Vein: No evidence of thrombus. Normal compressibility, respiratory phasicity and response to augmentation. Calf Veins: No evidence of thrombus. Normal compressibility and flow on color Doppler imaging. Venous Reflux:  None. Other Findings: Probable 3.7 cm Baker's cyst is noted in popliteal fossa. LEFT LOWER EXTREMITY Common Femoral Vein: No evidence of thrombus. Normal compressibility, respiratory phasicity and response to augmentation. Saphenofemoral Junction:  No evidence of thrombus. Normal compressibility and flow on color Doppler imaging. Profunda Femoral Vein: No evidence of thrombus. Normal compressibility and flow on color Doppler imaging. Femoral Vein: No evidence of thrombus. Normal compressibility, respiratory phasicity and response to augmentation. Popliteal Vein: No evidence of thrombus. Normal compressibility, respiratory phasicity and response to augmentation. Calf Veins: No evidence of thrombus. Normal compressibility and flow on color Doppler imaging. Venous Reflux:  None. Other Findings: Probable 1.3 cm Baker's cyst seen in left popliteal fossa. IMPRESSION: No evidence of deep venous thrombosis in either lower extremity. Electronically Signed   By: Marijo Conception M.D.   On: 04/05/2019 15:21   DG C-Arm 1-60 Min-No Report  Result Date: 01/28/2019 Fluoroscopy was utilized by the requesting physician.  No radiographic interpretation.    [place] clinic labs CA 19-9 was check on 10/21/2018, level was elevated at 1500.    ASSESSMENT & PLAN:  1. Diarrhea, unspecified type   2. Malignant neoplasm of head of pancreas ([place])  3. Neuropathy   4. Normocytic anemia   . # Stage IV pancreatic cancer-7Labs are reviewed and discussed with patient CA19.9 1500--> 2101-->1614-->  897-->940--> 1972-->3374--> 6032 Status post 1 cycle of second line treatment with 5-FU and liposomal irinotecan. Labs reviewed and discussed with patient. Counts are stable  #Diarrhea, likely secondary to chemotherapy. Advised patient to use Imodium as instructed. Patient will receive 1 L of normal saline IV fluid today.  #Osseous metastasis, patient has started palliative radiation. #Neoplasm related pain/back pain Continue oxycodone 5 mg every 6 hours as needed for pain.  #Neuropathy, grade 1/2.  Continue gabapentin 100 mg 3 times daily. #Weight loss, weight has been stable Follow-up with palliative care.. Anemia, hemoglobin 10.6.  Likely secondary to  chemotherapy. Iron panel was checked which showed high ferritin level, increased saturation ratio.  Likely anemia secondary to chronic disease.  High ferritin and iron saturation.  Check hemochromatosis Normal vitamin B12 and folate level. #Discussed about genetic testing.  Patient has been referred to genetic testing.   #Bilateral lower extremity swelling, etiology unknown.  Ultrasound is negative for DVT.  She has Baker's cyst left popliteal.  All questions were answered. The patient knows to call the clinic with any problems questions or concerns.  cc Melissa Hector, MD   Return of visit: 1 week We spent sufficient time to discuss many aspect of care, questions were answered to patient's satisfaction.  Earlie Server, MD, PhD Hematology Oncology [place] at [facility] 04/13/2019

## 2019-04-14 ENCOUNTER — Inpatient Hospital Stay: Payer: Medicare Other

## 2019-04-14 ENCOUNTER — Ambulatory Visit
Admission: RE | Admit: 2019-04-14 | Discharge: 2019-04-14 | Disposition: A | Discharge status: Home / Self Care | Payer: Medicare Other | Source: Ambulatory Visit | Attending: Radiation Oncology | Admitting: Radiation Oncology

## 2019-04-14 ENCOUNTER — Inpatient Hospital Stay (HOSPITAL_BASED_OUTPATIENT_CLINIC_OR_DEPARTMENT_OTHER): Payer: Medicare Other | Admitting: Licensed Clinical Social Worker

## 2019-04-14 ENCOUNTER — Other Ambulatory Visit: Payer: Self-pay

## 2019-04-14 DIAGNOSIS — C250 Malignant neoplasm of head of pancreas: Secondary | ICD-10-CM

## 2019-04-14 DIAGNOSIS — Z510 Encounter for antineoplastic radiation therapy: Secondary | ICD-10-CM | POA: Diagnosis not present

## 2019-04-14 NOTE — Progress Notes (Signed)
REFERRING PROVIDER: Earlie Server, MD [place],  [place] [address]  PRIMARY PROVIDER:  Adin Hector, MD  PRIMARY REASON FOR VISIT:  1. Malignant neoplasm of head of pancreas ([place])    I connected with Melissa [name] on 04/14/2019 at 10:55 AM EDT by Webex and verified that I am speaking with the correct person using two identifiers.    Patient location: [place] Provider location: [facility]   HISTORY OF PRESENT ILLNESS:   Melissa [name], a 79 y.o. female, was seen for a [place] cancer genetics consultation at the request of Dr. Tasia Catchings due to a personal history of pancreatic cancer.  Melissa [name] presents to clinic today to discuss the possibility of a hereditary predisposition to cancer, genetic testing, and to further clarify her future cancer risks, as well as potential cancer risks for family members.   In 2020, at the age of 53, Melissa [name] was diagnosed with pancreatic cancer. The current treatment plan includes chemotherapy and radiation.   CANCER HISTORY:  Oncology History Overview Note  Melissa [name] is a  79 y.o.  female with PMH listed below was seen in consultation at the request of  Adin Hector, MD  for evaluation of abnormal pancreatic disease. Patient was recently seen by primary care provider Dr. Caryl Comes for evaluation of nausea, diarrhea and jaundice.  Blood work on 10/19/2018 showed potassium 2.9, bilirubin 15, alkaline phosphatase 478, AST 114, ALT 95. Abdomen pelvis CT scan on 10/20/2018 showed moderate to market intra-and extra hepatic biliary dilation with mild diffuse dilatation of the pancreatic duct. Patient has had poor appetite and has lost a 5 to 10 pounds recently. She also had diarrhea.  She noticed sand like stool for 1- 2 weeks.  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  She placed her mother to assisted living  yesterday. She has had discussion with Dr. Caryl Comes about her blood work and CT scans.  She understands that there is a strong suspicion of cancer.  Nonessential medication has been stopped. CA 19-9 was check on 10/21/2018, level was elevated at 1500.   # Patient was admitted from 10/29/2018-10/31/2018.  Status post ERCP and biliary stenting.  Patient had duodenum ampulla biopsy showed nondiagnostic.  Negative for invasive carcinoma. Bilirubin trended down to 11 at the time of discharge on 10/31/2018.   #PET scan images were independent reviewed by me and discussed with patient. cTxN2 M1a Patient has at least M1 a disease due to retroperitoneal/peri-aortic lymph node involvement,  sclerotic lesion of right proximal femur with mild hypermetabolic activity, as well as left sacrum   # EUS biopsy of pancreatic mass showed positive for malignancy, Adenocarcinoma. There is not enough tissue for NGS testing.  Attempt to at MMR, still not enough tissue to complete the testing   Malignant neoplasm of head of pancreas ([place])  11/02/2018 Initial Diagnosis   Malignant neoplasm of head of pancreas [facility])    Chemotherapy   The patient had PACLitaxel-protein bound (ABRAXANE) chemo infusion 200 mg, 115 mg/m2 = 225 mg, Intravenous,  Once, 1 of 5 cycles  Administration: 200 mg (11/30/2018)    gemcitabine (GEMZAR) 1,600 mg in sodium chloride 0.9 % 250 mL chemo infusion, 1,710 mg, Intravenous,  Once, 1 of 5 cycles  Administration: 1,600 mg (11/30/2018)  for chemotherapy treatment.  RISK FACTORS:  Menarche was at age 37  First live birth at age 89.  OCP use: yes Ovaries intact: yes.  Hysterectomy: no.  Menopausal status: postmenopausal.  HRT use: 0 years. Colonoscopy: yes; normal. Number of breast biopsies: 1. Any excessive radiation exposure in the past: no  Past Medical History:  Diagnosis Date  . Glaucoma   . Hypertension   . Osteopenia   . Stroke [facility])     Past Surgical History:   Procedure Laterality Date  . BREAST BIOPSY Right    core- stereo- neg  . ERCP N/A 10/29/2018   Procedure: ENDOSCOPIC RETROGRADE CHOLANGIOPANCREATOGRAPHY (ERCP);  Surgeon: [name], MD;  Location: [facility] ENDOSCOPY;  Service: Endoscopy;  Laterality: N/A;  . ERCP N/A 01/28/2019   Procedure: ENDOSCOPIC RETROGRADE CHOLANGIOPANCREATOGRAPHY (ERCP) STENT REMOVAL;  Surgeon: [name], MD;  Location: [facility] ENDOSCOPY;  Service: Endoscopy;  Laterality: N/A;  . EUS N/A 11/18/2018   Procedure: FULL UPPER ENDOSCOPIC ULTRASOUND (EUS) RADIAL;  Surgeon: Holly Bodily, MD;  Location: [facility] ENDOSCOPY;  Service: Gastroenterology;  Laterality: N/A;  . HIP ARTHROPLASTY Left 01/02/2016   Procedure: ARTHROPLASTY BIPOLAR HIP (HEMIARTHROPLASTY);  Surgeon: Dereck Leep, MD;  Location: [facility] ORS;  Service: Orthopedics;  Laterality: Left;  . JOINT REPLACEMENT Left    THR  . PORTACATH PLACEMENT Right 11/26/2018   Procedure: INSERTION PORT-A-CATH;  Surgeon: Jules Husbands, MD;  Location: [facility] ORS;  Service: General;  Laterality: Right;  . TONSILLECTOMY      Social History   Socioeconomic History  . Marital status: Married    Spouse name: Not on file  . Number of children: Not on file  . Years of education: Not on file  . Highest education level: Not on file  Occupational History  . Not on file  Tobacco Use  . Smoking status: Never Smoker  . Smokeless tobacco: Never Used  Substance and Sexual Activity  . Alcohol use: No  . Drug use: Never  . Sexual activity: Not on file  Other Topics Concern  . Not on file  Social History Narrative  . Not on file   Social Determinants of Health   Financial Resource Strain:   . Difficulty of Paying Living Expenses: Not on file  Food Insecurity:   . Worried About Charity fundraiser in the Last Year: Not on file  . Ran Out of Food in the Last Year: Not on file  Transportation Needs:   . Lack of Transportation (Medical): Not on file  . Lack of Transportation  (Non-Medical): Not on file  Physical Activity:   . Days of Exercise per Week: Not on file  . Minutes of Exercise per Session: Not on file  Stress:   . Feeling of Stress : Not on file  Social Connections:   . Frequency of Communication with Friends and Family: Not on file  . Frequency of Social Gatherings with Friends and Family: Not on file  . Attends Religious Services: Not on file  . Active Member of Clubs or Organizations: Not on file  . Attends Archivist Meetings: Not on file  . Marital Status: Not on file     FAMILY HISTORY:  We obtained a detailed, 4-generation family history.  Significant diagnoses are listed below: Family History  Problem Relation Age of Onset  . Kidney disease Father   . Breast cancer Neg Hx    Melissa [name] had 2 daughters. One of her daughters had spina bifida and died at 91. Her other  daughter is living at 51. Melissa [name] has 3 grandchildren. She also has 1 brother, living at 70 with no cancer history.  Melissa [name] mother died at 36, no cancer history. Patient had approximately 6 maternal uncles and 4 maternal aunts, no cancers. She believes a maternal cousin may have had cancer but is unsure the type. Maternal grandparents did not have cancer that she is aware of.  Melissa [name] father died at 4, no history of cancer. Patient had approximately 6 paternal aunts and 4 paternal uncles, no cancers. No known cancers in paternal cousins. Paternal grandparents did not have cancer.  Melissa [name] is unaware of previous family history of genetic testing for hereditary cancer risks. Patient's maternal ancestors are of English/unknown descent, and paternal ancestors are of English/unknown descent. There is no reported Ashkenazi Jewish ancestry. There is no known consanguinity.  GENETIC COUNSELING ASSESSMENT: Melissa [name] is a 79 y.o. female with a personal history which is somewhat suggestive of a hereditary cancer syndrome and predisposition to cancer. We,  therefore, discussed and recommended the following at today's visit.   DISCUSSION: We discussed that 5 - 10% of pancreatic cancer is hereditary, with most cases associated with BRCA1/BRCA2 mutations.  There are other genes that can be associated with hereditary pancreatic cancer syndromes.   We discussed that testing is beneficial for several reasons including knowing if they are a candidate for certain targeted therapies, knowing how to follow individuals for cancer screenings, and understand if other family members could be at risk for cancer and allow them to undergo genetic testing.   We reviewed the characteristics, features and inheritance patterns of hereditary cancer syndromes. We also discussed genetic testing, including the appropriate family members to test, the process of testing, insurance coverage and turn-around-time for results. We discussed the implications of a negative, positive and/or variant of uncertain significant result. We recommended Melissa [name] pursue genetic testing for the Common Hereditary Cancers gene panel.   Based on Melissa [name]'s personal history of cancer, she meets medical criteria for genetic testing. Despite that she meets criteria, she may still have an out of pocket cost.   PLAN: After considering the risks, benefits, and limitations, Melissa [name] provided informed consent to pursue genetic testing and the blood sample was sent to [facility] for analysis of the Common Hereditary Cancers Panel. Results should be available within approximately 2-3 weeks' time, at which point they will be disclosed by telephone to Melissa [name], as will any additional recommendations warranted by these results. Melissa [name] will receive a summary of her genetic counseling visit and a copy of her results once available. This information will also be available in Epic.   Melissa [name] questions were answered to her satisfaction today. Our contact information was provided should additional  questions or concerns arise. Thank you for the referral and allowing [place] to share in the care of your patient.   Faith Rogue, MS, [facility] Genetic Counselor [email or web address] Phone: [phone]  The patient was seen for a total of 25 minutes in face-to-face genetic counseling.  Drs. Magrinat, Lindi Adie and/or Burr Medico were available for discussion regarding this case.   _______________________________________________________________________ For Office Staff:  Number of people involved in session: 1 Was an Intern/ student involved with case: no

## 2019-04-14 NOTE — Progress Notes (Signed)
Nutrition Follow-up:  Patient with stage IV pancreatic cancer.  Patient receiving palliative chemotherapy 5 fu and liposomal irinotecan  Met with patient today in clinic following genetic appointment.  Patient reports fairly good appetite.  Reports that she has not been doing most of the cooking lately due to cancer and treatments.  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  "I am not licking the bowls like I used to. " My husband has lost weight too."  Family and friends are bring in foods for her to eat.  Daughter lives nearby and helps with meals as well.  Reports diarrhea from Sunday until yesterday.  Did not take imodium but knows now to take it.  Reports that typically she eats cereal and yogurt for breakfast and drinks ensure plus.  Lunch is sandwich and supper is meat and vegetables (pork chop, sweet potatoes and green beans or chicken and rice casserole).  Typically drinks ensure plus after supper as well.  "My husband tries to get me to eat a bowl of ice cream too before bed. "    Denies nausea, diarrhea has resolved.      Medications: imodium  Labs: reviewed   Anthropometrics:   Weight 139 lb 6.4 oz on 1/27 decreased from 143 lb on 9/22.     NUTRITION DIAGNOSIS: Inadequate oral intake continues with weight loss   INTERVENTION:  Reviewed strategies to increase calories and protein. Encouraged patient to continue ensure plus BID and add snack between lunch and supper.  Reviewed importance of monitoring diarrhea and taking medications.  Reviewed foods that could increase diarrhea.     MONITORING, EVALUATION, GOAL: Patient will consume adequate calories and protein to maintain weight   NEXT VISIT: Feb 25th phone visit  Olyn Landstrom B. Zenia Resides, [place], [place] Registered Dietitian [phone] (pager)

## 2019-04-15 ENCOUNTER — Ambulatory Visit
Admission: RE | Admit: 2019-04-15 | Discharge: 2019-04-15 | Disposition: A | Discharge status: Home / Self Care | Payer: Medicare Other | Source: Ambulatory Visit | Attending: Radiation Oncology | Admitting: Radiation Oncology

## 2019-04-15 ENCOUNTER — Inpatient Hospital Stay (HOSPITAL_BASED_OUTPATIENT_CLINIC_OR_DEPARTMENT_OTHER): Payer: Medicare Other | Admitting: Hospice and Palliative Medicine

## 2019-04-15 ENCOUNTER — Other Ambulatory Visit: Payer: Self-pay

## 2019-04-15 DIAGNOSIS — Z515 Encounter for palliative care: Secondary | ICD-10-CM

## 2019-04-15 DIAGNOSIS — C250 Malignant neoplasm of head of pancreas: Secondary | ICD-10-CM

## 2019-04-15 DIAGNOSIS — Z510 Encounter for antineoplastic radiation therapy: Secondary | ICD-10-CM | POA: Diagnosis not present

## 2019-04-15 DIAGNOSIS — Z5111 Encounter for antineoplastic chemotherapy: Secondary | ICD-10-CM | POA: Diagnosis not present

## 2019-04-15 NOTE — Progress Notes (Signed)
[place]  [phone] [phone]   Name: Melissa [name] Date: 04/15/2019 MRN: [id number]  DOB: November 25, 1940  Patient Care Team: Melissa [name] (Internal Medicine) Clent Jacks, RN as Oncology Nurse Navigator    REASON FOR CONSULTATION: Palliative Care consult requested for this 79 y.o. female with multiple medical problems including stage IV pancreatic cancer.  Patient was admitted 10/29/2018-10/31/2018 for ERCP and biliary stenting secondary to obstructive jaundice.  PET scan revealed hypermetabolic activity to retroperitoneal periaortic lymph node, sclerotic lesion of the right proximal femur and left sacrum.  Patient was referred to palliative care to help address goals and manage ongoing symptoms.   SOCIAL HISTORY:     reports that she has never smoked. She has never used smokeless tobacco. She reports that she does not drink alcohol or use drugs.   Patient is married and lives at home with her husband.  She was the caregiver for her 40 year old mother but recently had to move her into an ALF and mother subsequently died 6 days later.  Patient has a daughter who lives next door.  She had another daughter who died at age 82 from complications related to spina bifida.  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  ADVANCE DIRECTIVES:  Not on file.  Daughter is reportedly her healthcare power of attorney.  Patient also has a living will.  CODE STATUS: DNR  PAST MEDICAL HISTORY: Past Medical History:  Diagnosis Date  . Glaucoma   . Hypertension   . Osteopenia   . Stroke [facility])     PAST SURGICAL HISTORY:  Past Surgical History:  Procedure Laterality Date  . BREAST BIOPSY Right    core- stereo- neg  . ERCP N/A 10/29/2018   Procedure: ENDOSCOPIC RETROGRADE CHOLANGIOPANCREATOGRAPHY (ERCP);  Surgeon: Melissa Lame, [name];  Location: [facility] ENDOSCOPY;  Service:  Endoscopy;  Laterality: N/A;  . ERCP N/A 01/28/2019   Procedure: ENDOSCOPIC RETROGRADE CHOLANGIOPANCREATOGRAPHY (ERCP) STENT REMOVAL;  Surgeon: Melissa Lame, [name];  Location: [facility] ENDOSCOPY;  Service: Endoscopy;  Laterality: N/A;  . EUS N/A 11/18/2018   Procedure: FULL UPPER ENDOSCOPIC ULTRASOUND (EUS) RADIAL;  Surgeon: Melissa Bodily, [name];  Location: [facility] ENDOSCOPY;  Service: Gastroenterology;  Laterality: N/A;  . HIP ARTHROPLASTY Left 01/02/2016   Procedure: ARTHROPLASTY BIPOLAR HIP (HEMIARTHROPLASTY);  Surgeon: Melissa Leep, [name];  Location: [facility] ORS;  Service: Orthopedics;  Laterality: Left;  . JOINT REPLACEMENT Left    THR  . PORTACATH PLACEMENT Right 11/26/2018   Procedure: INSERTION PORT-A-CATH;  Surgeon: Melissa Husbands, [name];  Location: [facility] ORS;  Service: [name];  Laterality: Right;  . TONSILLECTOMY      HEMATOLOGY/ONCOLOGY HISTORY:  Oncology History Overview Note  Melissa [name] is a  79 y.o.  female with PMH listed below was seen in consultation at the request of  Melissa [name]  for evaluation of abnormal pancreatic disease. Patient was recently seen by primary care provider Melissa [name] for evaluation of nausea, diarrhea and jaundice.  Blood work on 10/19/2018 showed potassium 2.9, bilirubin 15, alkaline phosphatase 478, AST 114, ALT 95. Abdomen pelvis CT scan on 10/20/2018 showed moderate to market intra-and extra hepatic biliary dilation with mild diffuse dilatation of the pancreatic duct. Patient has had poor appetite and has lost a 5 to 10 pounds recently. She also had diarrhea.  She noticed sand like stool for 1- 2 weeks.  She takes care of  her 73 year old mother and recently feels she is not able to take care of her anymore due to progressively worsening weakness and fatigue.  She placed her mother to assisted living yesterday. She has had discussion with Melissa [name] about her blood work and CT scans.  She understands that there is a strong suspicion of cancer.  Nonessential  medication has been stopped. CA 19-9 was check on 10/21/2018, level was elevated at 1500.   # Patient was admitted from 10/29/2018-10/31/2018.  Status post ERCP and biliary stenting.  Patient had duodenum ampulla biopsy showed nondiagnostic.  Negative for invasive carcinoma. Bilirubin trended down to 11 at the time of discharge on 10/31/2018.   #PET scan images were independent reviewed by me and discussed with patient. cTxN2 M1a Patient has at least M1 a disease due to retroperitoneal/peri-aortic lymph node involvement,  sclerotic lesion of right proximal femur with mild hypermetabolic activity, as well as left sacrum   # EUS biopsy of pancreatic mass showed positive for malignancy, Adenocarcinoma. There is not enough tissue for NGS testing.  Attempt to at MMR, still not enough tissue to complete the testing   Malignant neoplasm of head of pancreas ([place])  11/02/2018 Initial Diagnosis   Malignant neoplasm of head of pancreas [facility])    Chemotherapy   The patient had PACLitaxel-protein bound (ABRAXANE) chemo infusion 200 mg, 115 mg/m2 = 225 mg, Intravenous,  Once, 1 of 5 cycles  Administration: 200 mg (11/30/2018)    gemcitabine (GEMZAR) 1,600 mg in sodium chloride 0.9 % 250 mL chemo infusion, 1,710 mg, Intravenous,  Once, 1 of 5 cycles  Administration: 1,600 mg (11/30/2018)  for chemotherapy treatment.       ALLERGIES:  has No Known Allergies.  MEDICATIONS:  Current Outpatient Medications  Medication Sig Dispense Refill  . acetaminophen (TYLENOL) 500 MG tablet Take 500 mg by mouth every 6 (six) hours as needed.    Marland Kitchen amLODipine (NORVASC) 10 MG tablet Take 10 mg by mouth at bedtime.     Marland Kitchen dexamethasone (DECADRON) 4 MG tablet Take 2 tablets (8 mg total) by mouth daily. Start the day after irinotecan chemotherapy for 2 days. 8 tablet 5  . docusate sodium (COLACE) 100 MG capsule Take 1 capsule (100 mg total) by mouth daily as needed for mild constipation or moderate constipation. Do not take  if you have loose stools 60 capsule 2  . gabapentin (NEURONTIN) 300 MG capsule Take 1 capsule (300 mg total) by mouth 2 (two) times daily. 60 capsule 0  . lidocaine-prilocaine (EMLA) cream Apply to affected area once 30 g 3  . loperamide (IMODIUM A-D) 2 MG tablet Take 1 tablet (2 mg total) by mouth See admin instructions. Take 2 at diarrhea onset , then 1 every 2hr until 12hrs with no BM. May take 2 every 4hrs at night. If diarrhea recurs repeat. 100 tablet 1  . ondansetron (ZOFRAN) 8 MG tablet Take 1 tablet by mouth as needed.    Marland Kitchen oxyCODONE (ROXICODONE) 5 MG immediate release tablet Take 1 tablet (5 mg total) by mouth every 6 (six) hours as needed for moderate pain or severe pain. 60 tablet 0  . potassium chloride (K-DUR) 10 MEQ tablet Take 1 tablet (10 mEq total) by mouth daily. (Patient taking differently: Take 10 mEq by mouth 2 (two) times daily. ) 30 tablet 0  . prochlorperazine (COMPAZINE) 10 MG tablet Take 1 tablet (10 mg total) by mouth every 6 (six) hours as needed (NAUSEA). 30 tablet 1   No  current facility-administered medications for this visit.    VITAL SIGNS: There were no vitals taken for this visit. There were no vitals filed for this visit.  Estimated body mass index is 27.22 kg/m as calculated from the following:   Height as of 01/28/19: 5' (1.524 m).   Weight as of 04/13/19: 139 lb 6.4 oz (63.2 kg).  LABS: CBC:    Component Value Date/Time   WBC 6.4 04/13/2019 0943   HGB 10.6 (L) 04/13/2019 0943   HCT 32.2 (L) 04/13/2019 0943   PLT 258 04/13/2019 0943   MCV 106.6 (H) 04/13/2019 0943   NEUTROABS 5.4 04/13/2019 0943   LYMPHSABS 0.2 (L) 04/13/2019 0943   MONOABS 0.4 04/13/2019 0943   EOSABS 0.3 04/13/2019 0943   BASOSABS 0.0 04/13/2019 0943   Comprehensive Metabolic Panel:    Component Value Date/Time   NA 135 04/13/2019 0943   K 3.9 04/13/2019 0943   CL 103 04/13/2019 0943   CO2 25 04/13/2019 0943   BUN 17 04/13/2019 0943   CREATININE 0.68 04/13/2019 0943     GLUCOSE 129 (H) 04/13/2019 0943   CALCIUM 8.3 (L) 04/13/2019 0943   AST 26 04/13/2019 0943   ALT 22 04/13/2019 0943   ALKPHOS 366 (H) 04/13/2019 0943   BILITOT 0.7 04/13/2019 0943   PROT 6.7 04/13/2019 0943   ALBUMIN 2.9 (L) 04/13/2019 0943    RADIOGRAPHIC STUDIES: CT Chest W Contrast  Result Date: 03/31/2019 CLINICAL DATA:  Stage IV pancreatic adenocarcinoma diagnosed August 2020 on palliative chemotherapy. Restaging. EXAM: CT CHEST, ABDOMEN, AND PELVIS WITH CONTRAST TECHNIQUE: Multidetector CT imaging of the chest, abdomen and pelvis was performed following the standard protocol during bolus administration of intravenous contrast. CONTRAST:  157m OMNIPAQUE IOHEXOL 300 MG/ML  SOLN COMPARISON:  02/15/2019 CT abdomen/pelvis.  11/09/2018 PET-CT. FINDINGS: CT CHEST FINDINGS Cardiovascular: Top-normal heart size. Right internal jugular Port-A-Cath terminates at the cavoatrial junction. No significant pericardial effusion/thickening. Atherosclerotic nonaneurysmal thoracic aorta. Normal caliber pulmonary arteries. No central pulmonary emboli. Mediastinum/Nodes: Heterogeneous thyroid gland with subcentimeter bilateral thyroid nodules requiring no follow-up. Unremarkable esophagus. No axillary or hilar adenopathy. Mildly enlarged 1.0 cm lower right paraesophageal node (series 2/image 34), stable since 11/09/2018 PET-CT. Left retrocrural enlarged 1.4 cm node (series 2/image 46), increased from 0.9 cm on 02/15/2019 CT abdomen. No additional pathologically enlarged mediastinal nodes. Lungs/Pleura: No pneumothorax. No pleural effusion. Anterior right middle lobe 3 mm solid pulmonary nodule (series 4/image 80) is stable since 11/09/2018 PET-CT. Mild platelike scarring versus atelectasis at both lung bases. No acute consolidative airspace disease, lung masses or new significant pulmonary nodules. Musculoskeletal: Several new sclerotic lesions throughout thoracic osseous structures including the sternum, bilateral  ribs, left superior scapula, left clavicle and thoracic spine, new since 11/09/2018 PET-CT. Moderate thoracic spondylosis. CT ABDOMEN PELVIS FINDINGS Hepatobiliary: Normal liver size. Simple lateral segment left liver lobe 1.0 cm cyst is stable. Subcentimeter hypodense segment 4B left liver lobe lesion is new (series 2/image 57). Subcentimeter hypodense segment 6 right liver lesion (series 2/image 58) is stable. Subcentimeter segment 8 right liver dome hypodense lesion (series 2/image 39) is stable. No additional new liver lesions. Numerous calcified gallstones throughout the gallbladder with stable mild diffuse gallbladder wall thickening. Pneumobilia in the intrahepatic left liver lobe bile ducts with stable mild central intrahepatic biliary ductal dilatation. CBD stent well positioned with distal tip in the duodenal lumen just beyond the ampulla. Pneumobilia throughout the stent lumen. Pancreas: Stable mild fullness of the pancreatic head with an distinct surrounding soft tissue planes  and no discrete measurable pancreatic mass by CT. Stable mild diffuse pancreatic duct dilation. No new pancreatic lesions. Spleen: Normal size. No mass. Adrenals/Urinary Tract: Normal adrenals. New moderate to severe right hydroureteronephrosis to the level of the lower right lumbar ureter without discrete obstructing stone or mass, although with mild urothelial wall thickening at the site of caliber transition of the right ureter (series 2/image 86). No left hydronephrosis. No renal masses. Bladder nondistended and obscured by streak artifact from left hip hardware. Stomach/Bowel: Small hiatal hernia. Otherwise normal nondistended stomach. Normal caliber small bowel with no small bowel wall thickening. Normal appendix. Mild sigmoid diverticulosis, with no large bowel wall thickening or acute pericolonic fat stranding. Vascular/Lymphatic: Atherosclerotic nonaneurysmal abdominal aorta. Patent portal, splenic, hepatic and renal  veins. Stable mildly enlarged 1.0 cm gastrohepatic ligament lymph node (series 2/image 48). Enlarged 1.4 cm porta hepatis node (series 2/image 51), previously 1.3 cm on 02/15/2019 CT, not appreciably changed. Stable enlarged 1.3 cm portacaval node (series 2/image 58). Enlarged 2.2 cm aortocaval node (series 2/image 53), previously 1.9 cm, mildly increased. Enlarged 1.7 cm left para-aortic node (series 2/image 60), previously 1.6 cm, not substantially changed. No pathologically enlarged pelvic lymph nodes. Reproductive: Grossly normal uterus.  No adnexal mass. Other: No pneumoperitoneum, ascites or focal fluid collection. Upper omental 1.3 cm soft tissue nodule (series 2/image 68), previously a 1.1 cm, slightly increased. Adjacent 0.7 cm upper omental soft tissue nodule (series 2/image 67), previously 0.5 cm, slightly increased. Small fat containing umbilical hernia. Musculoskeletal: Several sclerotic lesions scattered throughout the lumbar spine, upper sacrum, bilateral iliac bones and right pubic rami are unchanged since 02/15/2019 CT. No new osseous lesions. Left total hip arthroplasty. Mild lumbar spondylosis. IMPRESSION: 1. Findings suggest mild progression of metastatic disease. 2. Left retrocrural and aortocaval lymphadenopathy is mildly increased. Lower right paraesophageal, gastrohepatic ligament, porta hepatis, portacaval and left para-aortic lymphadenopathy is stable. 3. Upper omental soft tissue nodules are slightly increased and likely represent peritoneal metastases. 4. Three scattered subcentimeter hypodense liver lesions, one of which is new and two of which are stable, suspicious for liver metastases. 5. 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 Well-positioned CBD stent with pneumobilia indicating stent patency. 6. New moderate to severe right hydroureteronephrosis to the level of the lower  right lumbar ureter. No discrete stone or mass, although there is mild urothelial wall thickening at the site of caliber transition in the right ureter. Findings are indeterminate for benign treatment related or malignant right ureteral stricture. 7. 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 Sclerotic osseous metastases throughout the chest are new since most recent comparison chest imaging of 11/09/2018 PET-CT, compatible with response to therapy. 8. Aortic Atherosclerosis (ICD10-I70.0). Additional chronic findings as detailed. Electronically Signed   By: Ilona Sorrel M.D.   On: 03/31/2019 13:25   NM Bone Scan Whole Body  Result Date: 03/31/2019 CLINICAL DATA:  Stage IV pancreatic cancer EXAM: NUCLEAR MEDICINE WHOLE BODY BONE SCAN TECHNIQUE: Whole body anterior and posterior images were obtained approximately 3 hours after intravenous injection of radiopharmaceutical. RADIOPHARMACEUTICALS:  22.989 mCi Technetium-44mMDP IV COMPARISON:  CT chest abdomen pelvis 03/31/2019, PET-CT 11/09/2018 FINDINGS: Numerous sites of abnormal osseous tracer accumulation are identified consistent with widespread osseous metastatic disease. These include calvaria, BILATERAL ribs, sternum, thoracic/lumbar spine, and pelvis. In addition, numerous foci of abnormal uptake are seen within BILATERAL humeri, BILATERAL femora, and proximal forearms likely  radius. Many of these are identified on accompanying CT as well. RIGHT hydronephrosis and hydroureter are identified with abnormal thickening at the mid RIGHT ureter series 2, image 86. LEFT hip prosthesis incidentally noted. IMPRESSION: Widespread osseous metastatic disease including long bone metastases as above. RIGHT hydronephrosis and proximal hydroureter. Electronically Signed   By: Lavonia Dana M.D.   On: 03/31/2019 15:09   CT Abdomen Pelvis W Contrast  Result Date: 03/31/2019 CLINICAL DATA:  Stage IV pancreatic  adenocarcinoma diagnosed August 2020 on palliative chemotherapy. Restaging. EXAM: CT CHEST, ABDOMEN, AND PELVIS WITH CONTRAST TECHNIQUE: Multidetector CT imaging of the chest, abdomen and pelvis was performed following the standard protocol during bolus administration of intravenous contrast. CONTRAST:  177m OMNIPAQUE IOHEXOL 300 MG/ML  SOLN COMPARISON:  02/15/2019 CT abdomen/pelvis.  11/09/2018 PET-CT. FINDINGS: CT CHEST FINDINGS Cardiovascular: Top-normal heart size. Right internal jugular Port-A-Cath terminates at the cavoatrial junction. No significant pericardial effusion/thickening. Atherosclerotic nonaneurysmal thoracic aorta. Normal caliber pulmonary arteries. No central pulmonary emboli. Mediastinum/Nodes: Heterogeneous thyroid gland with subcentimeter bilateral thyroid nodules requiring no follow-up. Unremarkable esophagus. No axillary or hilar adenopathy. Mildly enlarged 1.0 cm lower right paraesophageal node (series 2/image 34), stable since 11/09/2018 PET-CT. Left retrocrural enlarged 1.4 cm node (series 2/image 46), increased from 0.9 cm on 02/15/2019 CT abdomen. No additional pathologically enlarged mediastinal nodes. Lungs/Pleura: No pneumothorax. No pleural effusion. Anterior right middle lobe 3 mm solid pulmonary nodule (series 4/image 80) is stable since 11/09/2018 PET-CT. Mild platelike scarring versus atelectasis at both lung bases. No acute consolidative airspace disease, lung masses or new significant pulmonary nodules. Musculoskeletal: Several new sclerotic lesions throughout thoracic osseous structures including the sternum, bilateral ribs, left superior scapula, left clavicle and thoracic spine, new since 11/09/2018 PET-CT. Moderate thoracic spondylosis. CT ABDOMEN PELVIS FINDINGS Hepatobiliary: Normal liver size. Simple lateral segment left liver lobe 1.0 cm cyst is stable. Subcentimeter hypodense segment 4B left liver lobe lesion is new (series 2/image 57). Subcentimeter hypodense  segment 6 right liver lesion (series 2/image 58) is stable. Subcentimeter segment 8 right liver dome hypodense lesion (series 2/image 39) is stable. No additional new liver lesions. Numerous calcified gallstones throughout the gallbladder with stable mild diffuse gallbladder wall thickening. Pneumobilia in the intrahepatic left liver lobe bile ducts with stable mild central intrahepatic biliary ductal dilatation. CBD stent well positioned with distal tip in the duodenal lumen just beyond the ampulla. Pneumobilia throughout the stent lumen. Pancreas: Stable mild fullness of the pancreatic head with an distinct surrounding soft tissue planes and no discrete measurable pancreatic mass by CT. Stable mild diffuse pancreatic duct dilation. No new pancreatic lesions. Spleen: Normal size. No mass. Adrenals/Urinary Tract: Normal adrenals. New moderate to severe right hydroureteronephrosis to the level of the lower right lumbar ureter without discrete obstructing stone or mass, although with mild urothelial wall thickening at the site of caliber transition of the right ureter (series 2/image 86). No left hydronephrosis. No renal masses. Bladder nondistended and obscured by streak artifact from left hip hardware. Stomach/Bowel: Small hiatal hernia. Otherwise normal nondistended stomach. Normal caliber small bowel with no small bowel wall thickening. Normal appendix. Mild sigmoid diverticulosis, with no large bowel wall thickening or acute pericolonic fat stranding. Vascular/Lymphatic: Atherosclerotic nonaneurysmal abdominal aorta. Patent portal, splenic, hepatic and renal veins. Stable mildly enlarged 1.0 cm gastrohepatic ligament lymph node (series 2/image 48). Enlarged 1.4 cm porta hepatis node (series 2/image 51), previously 1.3 cm on 02/15/2019 CT, not appreciably changed. Stable enlarged 1.3 cm portacaval node (series 2/image 58). Enlarged 2.2 cm  aortocaval node (series 2/image 53), previously 1.9 cm, mildly increased.  Enlarged 1.7 cm left para-aortic node (series 2/image 60), previously 1.6 cm, not substantially changed. No pathologically enlarged pelvic lymph nodes. Reproductive: Grossly normal uterus.  No adnexal mass. Other: No pneumoperitoneum, ascites or focal fluid collection. Upper omental 1.3 cm soft tissue nodule (series 2/image 68), previously a 1.1 cm, slightly increased. Adjacent 0.7 cm upper omental soft tissue nodule (series 2/image 67), previously 0.5 cm, slightly increased. Small fat containing umbilical hernia. Musculoskeletal: Several sclerotic lesions scattered throughout the lumbar spine, upper sacrum, bilateral iliac bones and right pubic rami are unchanged since 02/15/2019 CT. No new osseous lesions. Left total hip arthroplasty. Mild lumbar spondylosis. IMPRESSION: 1. Findings suggest mild progression of metastatic disease. 2. Left retrocrural and aortocaval lymphadenopathy is mildly increased. Lower right paraesophageal, gastrohepatic ligament, porta hepatis, portacaval and left para-aortic lymphadenopathy is stable. 3. Upper omental soft tissue nodules are slightly increased and likely represent peritoneal metastases. 4. Three scattered subcentimeter hypodense liver lesions, one of which is new and two of which are stable, suspicious for liver metastases. 5. 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 Well-positioned CBD stent with pneumobilia indicating stent patency. 6. 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 No discrete stone or mass, although there is mild urothelial wall thickening at the site of caliber transition in the right ureter. Findings are indeterminate for benign treatment related or malignant right ureteral stricture. 7. 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 Sclerotic  osseous metastases throughout the chest are new since most recent comparison chest imaging of 11/09/2018 PET-CT, compatible with response to therapy. 8. Aortic Atherosclerosis (ICD10-I70.0). Additional chronic findings as detailed. Electronically Signed   By: Ilona Sorrel M.D.   On: 03/31/2019 13:25   US Venous Img Lower Bilateral  Result Date: 04/05/2019 CLINICAL DATA:  Acute bilateral lower extremity swelling. EXAM: BILATERAL LOWER EXTREMITY VENOUS DOPPLER ULTRASOUND TECHNIQUE: Gray-scale sonography with graded compression, as well as color Doppler and duplex ultrasound were performed to evaluate the lower extremity deep venous systems from the level of the common femoral vein and including the common femoral, femoral, profunda femoral, popliteal and calf veins including the posterior tibial, peroneal and gastrocnemius veins when visible. The superficial great saphenous vein was also interrogated. Spectral Doppler was utilized to evaluate flow at rest and with distal augmentation maneuvers in the common femoral, femoral and popliteal veins. COMPARISON:  None. FINDINGS: RIGHT LOWER EXTREMITY Common Femoral Vein: No evidence of thrombus. Normal compressibility, respiratory phasicity and response to augmentation. Saphenofemoral Junction: No evidence of thrombus. Normal compressibility and flow on color Doppler imaging. Profunda Femoral Vein: No evidence of thrombus. Normal compressibility and flow on color Doppler imaging. Femoral Vein: No evidence of thrombus. Normal compressibility, respiratory phasicity and response to augmentation. Popliteal Vein: No evidence of thrombus. Normal compressibility, respiratory phasicity and response to augmentation. Calf Veins: No evidence of thrombus. Normal compressibility and flow on color Doppler imaging. Venous Reflux:  None. Other Findings: Probable 3.7 cm Baker's cyst is noted in popliteal fossa. LEFT LOWER EXTREMITY Common Femoral Vein: No evidence of thrombus. Normal  compressibility, respiratory phasicity and response to augmentation. Saphenofemoral Junction: No evidence of thrombus. Normal compressibility and flow on color Doppler imaging. Profunda Femoral Vein: No evidence of thrombus. Normal compressibility and flow on color Doppler imaging. Femoral Vein: No evidence of thrombus. Normal  compressibility, respiratory phasicity and response to augmentation. Popliteal Vein: No evidence of thrombus. Normal compressibility, respiratory phasicity and response to augmentation. Calf Veins: No evidence of thrombus. Normal compressibility and flow on color Doppler imaging. Venous Reflux:  None. Other Findings: Probable 1.3 cm Baker's cyst seen in left popliteal fossa. IMPRESSION: No evidence of deep venous thrombosis in either lower extremity. Electronically Signed   By: Marijo Conception M.D.   On: 04/05/2019 15:21    PERFORMANCE STATUS (ECOG) : 2 - Symptomatic, <50% confined to bed  Review of Systems Unless otherwise noted, a complete review of systems is negative.  Physical Exam [name]: NAD, frail appearing, thin Pulmonary: Unlabored Extremities: no edema, no joint deformities Skin: no rashes Neurological: Weakness but otherwise nonfocal  IMPRESSION: Routine follow-up visit made today.  Patient was seen in the consult room and accompanied by her husband.  Patient says that she is doing about the same.  She denies any significant changes or concerns.  She continues to have chronic fatigue.  She continues to have poor oral intake.  Weight is down about 2 pounds over the past week.  We discussed starting an appetite stimulant but patient declined.  She is being followed by nutrition.  Patient says that she is having right-sided pain.  She is receiving RT to the L-spine and SI joints.  Patient says that her pain is improved with use of oxycodone.  She is currently taking about every 6 hours.  She finds pain is tolerable on that regimen.  Patient asked about her  long-term prognosis.  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  I encouraged her to speak with Dr. Tasia Catchings, although this is been discussed previously.  PLAN: -Continue current scope of treatment -continue oral supplements -Would suggest trial of mirtazapine if patient will allow -Continue oxycodone 5 mg every 6 hours as needed -Prophylactic bowel regimen -Follow-up telephone visit in 4 weeks   Patient expressed understanding and was in agreement with this plan. She also understands that She can call the clinic at any time with any questions, concerns, or complaints.     Time Total: 15 minutes  Visit consisted of counseling and education dealing with the complex and emotionally intense issues of symptom management and palliative care in the setting of serious and potentially life-threatening illness.Greater than 50%  of this time was spent counseling and coordinating care related to the above assessment and plan.  Signed by: Altha Harm, PhD, NP-C [phone] (Work Cell)

## 2019-04-18 ENCOUNTER — Ambulatory Visit
Admission: RE | Admit: 2019-04-18 | Discharge: 2019-04-18 | Disposition: A | Discharge status: Home / Self Care | Payer: Medicare Other | Source: Ambulatory Visit | Attending: Radiation Oncology | Admitting: Radiation Oncology

## 2019-04-18 ENCOUNTER — Other Ambulatory Visit: Payer: Self-pay

## 2019-04-18 DIAGNOSIS — C7951 Secondary malignant neoplasm of bone: Secondary | ICD-10-CM | POA: Diagnosis present

## 2019-04-18 DIAGNOSIS — Z510 Encounter for antineoplastic radiation therapy: Secondary | ICD-10-CM | POA: Diagnosis not present

## 2019-04-18 DIAGNOSIS — C250 Malignant neoplasm of head of pancreas: Secondary | ICD-10-CM | POA: Insufficient documentation

## 2019-04-19 ENCOUNTER — Inpatient Hospital Stay: Payer: Medicare Other

## 2019-04-19 ENCOUNTER — Ambulatory Visit
Admission: RE | Admit: 2019-04-19 | Discharge: 2019-04-19 | Disposition: A | Discharge status: Home / Self Care | Payer: Medicare Other | Source: Ambulatory Visit | Attending: Radiation Oncology | Admitting: Radiation Oncology

## 2019-04-19 ENCOUNTER — Inpatient Hospital Stay: Payer: Medicare Other | Attending: Oncology

## 2019-04-19 ENCOUNTER — Encounter: Payer: Self-pay | Admitting: Oncology

## 2019-04-19 ENCOUNTER — Inpatient Hospital Stay (HOSPITAL_BASED_OUTPATIENT_CLINIC_OR_DEPARTMENT_OTHER): Payer: Medicare Other | Admitting: Oncology

## 2019-04-19 ENCOUNTER — Other Ambulatory Visit: Payer: Self-pay

## 2019-04-19 VITALS — BP 122/76 | HR 82 | Temp 97.0°F | Wt 138.0 lb

## 2019-04-19 DIAGNOSIS — C7951 Secondary malignant neoplasm of bone: Secondary | ICD-10-CM | POA: Diagnosis present

## 2019-04-19 DIAGNOSIS — R197 Diarrhea, unspecified: Secondary | ICD-10-CM

## 2019-04-19 DIAGNOSIS — Z9221 Personal history of antineoplastic chemotherapy: Secondary | ICD-10-CM | POA: Insufficient documentation

## 2019-04-19 DIAGNOSIS — Z923 Personal history of irradiation: Secondary | ICD-10-CM | POA: Insufficient documentation

## 2019-04-19 DIAGNOSIS — Z510 Encounter for antineoplastic radiation therapy: Secondary | ICD-10-CM | POA: Diagnosis not present

## 2019-04-19 DIAGNOSIS — E860 Dehydration: Secondary | ICD-10-CM | POA: Insufficient documentation

## 2019-04-19 DIAGNOSIS — C250 Malignant neoplasm of head of pancreas: Secondary | ICD-10-CM

## 2019-04-19 DIAGNOSIS — C772 Secondary and unspecified malignant neoplasm of intra-abdominal lymph nodes: Secondary | ICD-10-CM | POA: Insufficient documentation

## 2019-04-19 DIAGNOSIS — Z5111 Encounter for antineoplastic chemotherapy: Secondary | ICD-10-CM | POA: Insufficient documentation

## 2019-04-19 DIAGNOSIS — D490 Neoplasm of unspecified behavior of digestive system: Secondary | ICD-10-CM

## 2019-04-19 DIAGNOSIS — K521 Toxic gastroenteritis and colitis: Secondary | ICD-10-CM | POA: Insufficient documentation

## 2019-04-19 DIAGNOSIS — G629 Polyneuropathy, unspecified: Secondary | ICD-10-CM

## 2019-04-19 DIAGNOSIS — E876 Hypokalemia: Secondary | ICD-10-CM

## 2019-04-19 DIAGNOSIS — D649 Anemia, unspecified: Secondary | ICD-10-CM | POA: Diagnosis not present

## 2019-04-19 DIAGNOSIS — K1230 Oral mucositis (ulcerative), unspecified: Secondary | ICD-10-CM

## 2019-04-19 LAB — COMPREHENSIVE METABOLIC PANEL
ALT: 16 U/L (ref 0–44)
AST: 25 U/L (ref 15–41)
Albumin: 2.9 g/dL — ABNORMAL LOW (ref 3.5–5.0)
Alkaline Phosphatase: 291 U/L — ABNORMAL HIGH (ref 38–126)
Anion gap: 12 (ref 5–15)
BUN: 13 mg/dL (ref 8–23)
CO2: 22 mmol/L (ref 22–32)
Calcium: 8.4 mg/dL — ABNORMAL LOW (ref 8.9–10.3)
Chloride: 105 mmol/L (ref 98–111)
Creatinine, Ser: 0.79 mg/dL (ref 0.44–1.00)
GFR calc Af Amer: >60 mL/min (ref >60)
GFR calc non Af Amer: >60 mL/min (ref >60)
Glucose, Bld: 150 mg/dL — ABNORMAL HIGH (ref 70–99)
Potassium: 3.3 mmol/L — ABNORMAL LOW (ref 3.5–5.1)
Sodium: 139 mmol/L (ref 135–145)
Total Bilirubin: 0.6 mg/dL (ref 0.3–1.2)
Total Protein: 6.4 g/dL — ABNORMAL LOW (ref 6.5–8.1)

## 2019-04-19 LAB — CBC WITH DIFFERENTIAL/PLATELET
Abs Immature Granulocytes: 0.03 10*3/uL (ref 0.00–0.07)
Basophils Absolute: 0 10*3/uL (ref 0.0–0.1)
Basophils Relative: 0 %
Eosinophils Absolute: 0.2 10*3/uL (ref 0.0–0.5)
Eosinophils Relative: 3 %
HCT: 31.9 % — ABNORMAL LOW (ref 36.0–46.0)
Hemoglobin: 10.5 g/dL — ABNORMAL LOW (ref 12.0–15.0)
Immature Granulocytes: 1 %
Lymphocytes Relative: 4 %
Lymphs Abs: 0.2 10*3/uL — ABNORMAL LOW (ref 0.7–4.0)
MCH: 34.8 pg — ABNORMAL HIGH (ref 26.0–34.0)
MCHC: 32.9 g/dL (ref 30.0–36.0)
MCV: 105.6 fL — ABNORMAL HIGH (ref 80.0–100.0)
Monocytes Absolute: 0.7 10*3/uL (ref 0.1–1.0)
Monocytes Relative: 13 %
Neutro Abs: 4.5 10*3/uL (ref 1.7–7.7)
Neutrophils Relative %: 79 %
Platelets: 206 10*3/uL (ref 150–400)
RBC: 3.02 MIL/uL — ABNORMAL LOW (ref 3.87–5.11)
RDW: 15.9 % — ABNORMAL HIGH (ref 11.5–15.5)
WBC: 5.7 10*3/uL (ref 4.0–10.5)
nRBC: 0 % (ref 0.0–0.2)

## 2019-04-19 MED ORDER — MAGIC MOUTHWASH W/LIDOCAINE: 5.0000 mL | Freq: Four times a day (QID) | ORAL | 1 refills | Status: AC | PRN

## 2019-04-19 MED ORDER — SODIUM CHLORIDE 0.9 % IV SOLN
396.0000 mg/m2 | Freq: Once | INTRAVENOUS | Status: AC
  Administered 2019-04-19: 650 mg via INTRAVENOUS
  Filled 2019-04-19: qty 25

## 2019-04-19 MED ORDER — SODIUM CHLORIDE 0.9 % IV SOLN
Freq: Once | INTRAVENOUS | Status: AC
  Filled 2019-04-19: qty 250

## 2019-04-19 MED ORDER — POTASSIUM CHLORIDE 20 MEQ/100ML IV SOLN
20.0000 meq | Freq: Once | INTRAVENOUS | Status: AC
  Administered 2019-04-19: 20 meq via INTRAVENOUS

## 2019-04-19 MED ORDER — VALACYCLOVIR HCL 500 MG PO TABS: 500.0000 mg | ORAL_TABLET | Freq: Two times a day (BID) | ORAL | 0 refills | Status: AC

## 2019-04-19 MED ORDER — SODIUM CHLORIDE 0.9% FLUSH
10.0000 mL | Freq: Once | INTRAVENOUS | Status: AC
  Administered 2019-04-19: 10 mL via INTRAVENOUS
  Filled 2019-04-19: qty 10

## 2019-04-19 MED ORDER — HEPARIN SOD (PORK) LOCK FLUSH 100 UNIT/ML IV SOLN
500.0000 [IU] | Freq: Once | INTRAVENOUS | Status: DC
  Filled 2019-04-19: qty 5

## 2019-04-19 MED ORDER — SODIUM CHLORIDE 0.9 % IV SOLN
2400.0000 mg/m2 | INTRAVENOUS | Status: DC
  Administered 2019-04-19: 3950 mg via INTRAVENOUS
  Filled 2019-04-19: qty 79

## 2019-04-19 MED ORDER — SODIUM CHLORIDE 0.9 % IV SOLN
10.0000 mg | Freq: Once | INTRAVENOUS | Status: AC
  Administered 2019-04-19: 10 mg via INTRAVENOUS
  Filled 2019-04-19: qty 10

## 2019-04-19 MED ORDER — SODIUM CHLORIDE 0.9 % IV SOLN
52.0000 mg/m2 | Freq: Once | INTRAVENOUS | Status: AC
  Administered 2019-04-19: 86 mg via INTRAVENOUS
  Filled 2019-04-19: qty 20

## 2019-04-19 MED ORDER — PALONOSETRON HCL INJECTION 0.25 MG/5ML
0.2500 mg | Freq: Once | INTRAVENOUS | Status: AC
  Administered 2019-04-19: 0.25 mg via INTRAVENOUS
  Filled 2019-04-19: qty 5

## 2019-04-19 NOTE — Progress Notes (Signed)
Hematology/Oncology follow up note [facility] [phone] [phone]   Patient Care Team: Adin Hector, MD as PCP - General (Internal Medicine) Clent Jacks, RN as Oncology Nurse Navigator  REFERRING PROVIDER: Adin Hector, MD  CHIEF COMPLAINTS/REASON FOR VISIT:  Follow-up for pancreatic cancer   HISTORY OF PRESENTING ILLNESS:   Melissa [name] is a  79 y.o.  female with PMH listed below was seen in consultation at the request of  Adin Hector, MD  for evaluation of abnormal pancreatic disease. Patient was recently seen by primary care provider Dr. Caryl Comes for evaluation of nausea, diarrhea and jaundice.  Blood work on 10/19/2018 showed potassium 2.9, bilirubin 15, alkaline phosphatase 478, AST 114, ALT 95. Abdomen pelvis CT scan on 10/20/2018 showed moderate to market intra-and extra hepatic biliary dilation with mild diffuse dilatation of the pancreatic duct. Patient has had poor appetite and has lost a 5 to 10 pounds recently. She also had diarrhea.  She noticed sand like stool for 1- 2 weeks.  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  She placed her mother to assisted living yesterday. She has had discussion with Dr. Caryl Comes about her blood work and CT scans.  She understands that there is a strong suspicion of cancer.  Nonessential medication has been stopped. CA 19-9 was check on 10/21/2018, level was elevated at 1500.  Today she denies any pain.  Itchy all over her body.. Per patient's request, patient's daughter [name] was [name] and [name] was able to hear entire clinical encounter conversation and participate in the reported history and discussion.  # Patient was admitted from 10/29/2018-10/31/2018.  Status post ERCP and biliary stenting.  Patient had duodenum ampulla biopsy showed nondiagnostic.  Negative for  invasive carcinoma. Bilirubin trended down to 11 at the time of discharge on 10/31/2018.  #PET scan images were independent reviewed by me and discussed with patient. cTxN2 M1a Patient has at least M1 a disease due to retroperitoneal/peri-aortic lymph node involvement,  sclerotic lesion of right proximal femur with mild hypermetabolic activity, as well as left sacrum  # EUS biopsy of pancreatic mass showed positive for malignancy, Adenocarcinoma. There is not enough tissue for NGS testing.  Attempt to at MMR, still not enough tissue to complete the testing.  # liquid biopsy sent on 03/22/2019- no reportable alteration  blood tumor burden 66mts/mb  INTERVAL HISTORY Melissa FISCHETTIis a 79y.o. female who has above history reviewed by me today presents for follow up visit for management of stage IV pancreatic cancer, follow-up for tolerability of second line chemotherapy with 5-FU and liposomal irinotecan. Patient was accompanied by her husband. Diarrhea has improved after using Imodium. She has mou which is exacerbated with eating food. Denies any fever, chills, nausea, Patient is on palliative radiation to her back and she reports lower back pain has improved.  Review of Systems  Constitutional: Positive for fatigue. Negative for appetite change, chills and fever.  HENT:   Negative for hearing loss and voice change.   Eyes: Negative for eye problems and icterus.  Respiratory: Negative for chest tightness and cough.   Cardiovascular: Positive for leg swelling. Negative for chest pain.  Gastrointestinal: Negative for abdominal distention, abdominal pain and blood in stool.  Endocrine: Negative for hot flashes.  Genitourinary: Negative for difficulty urinating and frequency.   Musculoskeletal:  Positive for back pain. Negative for arthralgias.  Skin: Negative for itching and rash.  Neurological: Positive for numbness. Negative for extremity weakness.  Hematological: Negative for  adenopathy.  Psychiatric/Behavioral: Negative for confusion.    MEDICAL HISTORY:  Past Medical History:  Diagnosis Date  . Glaucoma   . Hypertension   . Osteopenia   . Stroke [facility])     SURGICAL HISTORY: Past Surgical History:  Procedure Laterality Date  . BREAST BIOPSY Right    core- stereo- neg  . ERCP N/A 10/29/2018   Procedure: ENDOSCOPIC RETROGRADE CHOLANGIOPANCREATOGRAPHY (ERCP);  Surgeon: [name], MD;  Location: [facility] ENDOSCOPY;  Service: Endoscopy;  Laterality: N/A;  . ERCP N/A 01/28/2019   Procedure: ENDOSCOPIC RETROGRADE CHOLANGIOPANCREATOGRAPHY (ERCP) STENT REMOVAL;  Surgeon: [name], MD;  Location: [facility] ENDOSCOPY;  Service: Endoscopy;  Laterality: N/A;  . EUS N/A 11/18/2018   Procedure: FULL UPPER ENDOSCOPIC ULTRASOUND (EUS) RADIAL;  Surgeon: Holly Bodily, MD;  Location: [facility] ENDOSCOPY;  Service: Gastroenterology;  Laterality: N/A;  . HIP ARTHROPLASTY Left 01/02/2016   Procedure: ARTHROPLASTY BIPOLAR HIP (HEMIARTHROPLASTY);  Surgeon: Dereck Leep, MD;  Location: [facility] ORS;  Service: Orthopedics;  Laterality: Left;  . JOINT REPLACEMENT Left    THR  . PORTACATH PLACEMENT Right 11/26/2018   Procedure: INSERTION PORT-A-CATH;  Surgeon: Jules Husbands, MD;  Location: [facility] ORS;  Service: General;  Laterality: Right;  . TONSILLECTOMY      SOCIAL HISTORY: Social History   Socioeconomic History  . Marital status: Married    Spouse name: Not on file  . Number of children: Not on file  . Years of education: Not on file  . Highest education level: Not on file  Occupational History  . Not on file  Tobacco Use  . Smoking status: Never Smoker  . Smokeless tobacco: Never Used  Substance and Sexual Activity  . Alcohol use: No  . Drug use: Never  . Sexual activity: Not on file  Other Topics Concern  . Not on file  Social History Narrative  . Not on file   Social Determinants of Health   Financial Resource Strain:   . Difficulty of Paying Living  Expenses: Not on file  Food Insecurity:   . Worried About Charity fundraiser in the Last Year: Not on file  . Ran Out of Food in the Last Year: Not on file  Transportation Needs:   . Lack of Transportation (Medical): Not on file  . Lack of Transportation (Non-Medical): Not on file  Physical Activity:   . Days of Exercise per Week: Not on file  . Minutes of Exercise per Session: Not on file  Stress:   . Feeling of Stress : Not on file  Social Connections:   . Frequency of Communication with Friends and Family: Not on file  . Frequency of Social Gatherings with Friends and Family: Not on file  . Attends Religious Services: Not on file  . Active Member of Clubs or Organizations: Not on file  . Attends Archivist Meetings: Not on file  . Marital Status: Not on file  Intimate Partner Violence:   . Fear of Current or Ex-Partner: Not on file  . Emotionally Abused: Not on file  . Physically Abused: Not on file  . Sexually Abused: Not on file    FAMILY HISTORY: Family History  Problem Relation Age of Onset  . Kidney disease Father   . Breast cancer Neg Hx     ALLERGIES:  has No  Known Allergies.  MEDICATIONS:  Current Outpatient Medications  Medication Sig Dispense Refill  . acetaminophen (TYLENOL) 500 MG tablet Take 500 mg by mouth every 6 (six) hours as needed.    Marland Kitchen amLODipine (NORVASC) 10 MG tablet Take 10 mg by mouth at bedtime.     Marland Kitchen dexamethasone (DECADRON) 4 MG tablet Take 2 tablets (8 mg total) by mouth daily. Start the day after irinotecan chemotherapy for 2 days. 8 tablet 5  . docusate sodium (COLACE) 100 MG capsule Take 1 capsule (100 mg total) by mouth daily as needed for mild constipation or moderate constipation. Do not take if you have loose stools 60 capsule 2  . gabapentin (NEURONTIN) 300 MG capsule Take 1 capsule (300 mg total) by mouth 2 (two) times daily. 60 capsule 0  . lidocaine-prilocaine (EMLA) cream Apply to affected area once 30 g 3  . loperamide  (IMODIUM A-D) 2 MG tablet Take 1 tablet (2 mg total) by mouth See admin instructions. Take 2 at diarrhea onset , then 1 every 2hr until 12hrs with no BM. May take 2 every 4hrs at night. If diarrhea recurs repeat. 100 tablet 1  . ondansetron (ZOFRAN) 8 MG tablet Take 1 tablet by mouth as needed.    Marland Kitchen oxyCODONE (ROXICODONE) 5 MG immediate release tablet Take 1 tablet (5 mg total) by mouth every 6 (six) hours as needed for moderate pain or severe pain. 60 tablet 0  . potassium chloride (K-DUR) 10 MEQ tablet Take 1 tablet (10 mEq total) by mouth daily. (Patient taking differently: Take 10 mEq by mouth 2 (two) times daily. ) 30 tablet 0  . prochlorperazine (COMPAZINE) 10 MG tablet Take 1 tablet (10 mg total) by mouth every 6 (six) hours as needed (NAUSEA). 30 tablet 1  . magic mouthwash w/lidocaine SOLN Take 5 mLs by mouth 4 (four) times daily as needed for mouth pain. Sig: Swish/Swallow 5-10 ml four times a day as needed. Dispense 480 ml. 1RF 480 mL 1  . valACYclovir (VALTREX) 500 MG tablet Take 1 tablet (500 mg total) by mouth 2 (two) times daily. 60 tablet 0   No current facility-administered medications for this visit.   Facility-Administered Medications Ordered in Other Visits  Medication Dose Route Frequency Provider Last Rate Last Admin  . fluorouracil (ADRUCIL) 3,950 mg in sodium chloride 0.9 % 71 mL chemo infusion  2,400 mg/m2 (Treatment Plan Recorded) Intravenous 1 day or 1 dose Earlie Server, MD      . heparin lock flush 100 unit/mL  500 Units Intravenous Once Earlie Server, MD      . irinotecan LIPOSOME (ONIVYDE) 86 mg in sodium chloride 0.9 % 500 mL chemo infusion  52 mg/m2 (Treatment Plan Recorded) Intravenous Once Earlie Server, MD 347 mL/hr at 04/19/19 1101 86 mg at 04/19/19 1101  . leucovorin 650 mg in sodium chloride 0.9 % 250 mL infusion  396 mg/m2 (Treatment Plan Recorded) Intravenous Once Earlie Server, MD         PHYSICAL EXAMINATION: ECOG PERFORMANCE STATUS: 2 - Symptomatic, <50% confined to  bed Vitals:   04/19/19 0851  BP: 122/76  Pulse: 82  Temp: (!) 97 F (36.1 C)   Filed Weights   04/19/19 0851  Weight: 138 lb (62.6 kg)    Physical Exam Constitutional:      General: She is not in acute distress.    Appearance: She is ill-appearing.     Comments: Frail, sits in wheelchair.  HENT:     Head: Normocephalic and  atraumatic.     Comments: Alopecia    Mouth/Throat:     Comments: Mild mucositis, no thrush Eyes:     General: No scleral icterus.    Pupils: Pupils are equal, round, and reactive to light.  Cardiovascular:     Rate and Rhythm: Normal rate and regular rhythm.     Heart sounds: Normal heart sounds.  Pulmonary:     Effort: Pulmonary effort is normal. No respiratory distress.     Breath sounds: Normal breath sounds. No wheezing.  Abdominal:     General: Bowel sounds are normal. There is no distension.     Palpations: Abdomen is soft. There is no mass.     Tenderness: There is no abdominal tenderness.  Musculoskeletal:        General: No deformity. Normal range of motion.     Cervical back: Normal range of motion and neck supple.  Skin:    General: Skin is warm and dry.     Coloration: Skin is not jaundiced.  Neurological:     Mental Status: She is alert and oriented to person, place, and time. Mental status is at baseline.     Cranial Nerves: No cranial nerve deficit.  Psychiatric:        Mood and Affect: Mood normal.     LABORATORY DATA:  I have reviewed the data as listed Lab Results  Component Value Date   WBC 5.7 04/19/2019   HGB 10.5 (L) 04/19/2019   HCT 31.9 (L) 04/19/2019   MCV 105.6 (H) 04/19/2019   PLT 206 04/19/2019   Recent Labs    10/29/18 1318 10/30/18 0531 04/05/19 0851 04/13/19 0943 04/19/19 0834  NA 135   < > 133* 135 139  K 3.0*   < > 4.0 3.9 3.3*  CL 104   < > 101 103 105  CO2 21*   < > '22 25 22  ' GLUCOSE 110*   < > 171* 129* 150*  BUN 13   < > '16 17 13  ' CREATININE 0.32*   < > 0.67 0.68 0.79  CALCIUM 9.0   < >  8.2* 8.3* 8.4*  GFRNONAA >60   < > >60 >60 >60  GFRAA >60   < > >60 >60 >60  PROT 7.0   < > 7.7 6.7 6.4*  ALBUMIN 2.6*   < > 3.1* 2.9* 2.9*  AST 119*   < > '30 26 25  ' ALT 79*   < > '16 22 16  ' ALKPHOS 399*   < > 599* 366* 291*  BILITOT 18.2*   < > 0.8 0.7 0.6  BILIDIR 10.2*  --   --   --   --    < > = values in this interval not displayed.   Iron/TIBC/Ferritin/ %Sat    Component Value Date/Time   IRON 117 04/13/2019 0951   TIBC 223 (L) 04/13/2019 0951   FERRITIN 1,108 (H) 04/13/2019 0951   IRONPCTSAT 53 (H) 04/13/2019 1610     RADIOGRAPHIC STUDIES: I have personally reviewed the radiological images as listed and agreed with the findings in the report. 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 Restaging. EXAM: CT CHEST, ABDOMEN, AND PELVIS WITH CONTRAST TECHNIQUE: Multidetector CT imaging of the chest, abdomen and pelvis was performed following the standard protocol during bolus administration of intravenous contrast. CONTRAST:  151m OMNIPAQUE IOHEXOL 300 MG/ML  SOLN COMPARISON:  02/15/2019 CT abdomen/pelvis.  11/09/2018 PET-CT. FINDINGS: CT CHEST FINDINGS Cardiovascular: Top-normal heart size. Right internal jugular Port-A-Cath terminates at the cavoatrial junction. No significant pericardial effusion/thickening. Atherosclerotic nonaneurysmal thoracic aorta. Normal caliber pulmonary arteries. No central pulmonary emboli. Mediastinum/Nodes: Heterogeneous thyroid gland with subcentimeter bilateral thyroid nodules requiring no follow-up. Unremarkable esophagus. No axillary or hilar adenopathy. Mildly enlarged 1.0 cm lower right paraesophageal node (series 2/image 34), stable since 11/09/2018 PET-CT. Left retrocrural enlarged 1.4 cm node (series 2/image 46), increased from 0.9 cm on 02/15/2019 CT abdomen. No additional pathologically enlarged mediastinal nodes. Lungs/Pleura: No pneumothorax. No  pleural effusion. Anterior right middle lobe 3 mm solid pulmonary nodule (series 4/image 80) is stable since 11/09/2018 PET-CT. Mild platelike scarring versus atelectasis at both lung bases. No acute consolidative airspace disease, lung masses or new significant pulmonary nodules. Musculoskeletal: Several new sclerotic lesions throughout thoracic osseous structures including the sternum, bilateral ribs, left superior scapula, left clavicle and thoracic spine, new since 11/09/2018 PET-CT. Moderate thoracic spondylosis. CT ABDOMEN PELVIS FINDINGS Hepatobiliary: Normal liver size. Simple lateral segment left liver lobe 1.0 cm cyst is stable. Subcentimeter hypodense segment 4B left liver lobe lesion is new (series 2/image 57). Subcentimeter hypodense segment 6 right liver lesion (series 2/image 58) is stable. Subcentimeter segment 8 right liver dome hypodense lesion (series 2/image 39) is stable. No additional new liver lesions. Numerous calcified gallstones throughout the gallbladder with stable mild diffuse gallbladder wall thickening. Pneumobilia in the intrahepatic left liver lobe bile ducts with stable mild central intrahepatic biliary ductal dilatation. CBD stent well positioned with distal tip in the duodenal lumen just beyond the ampulla. Pneumobilia throughout the stent lumen. Pancreas: Stable mild fullness of the pancreatic head with an distinct surrounding soft tissue planes and no discrete measurable pancreatic mass by CT. Stable mild diffuse pancreatic duct dilation. No new pancreatic lesions. Spleen: Normal size. No mass. Adrenals/Urinary Tract: Normal adrenals. New moderate to severe right hydroureteronephrosis to the level of the lower right lumbar ureter without discrete obstructing stone or mass, although with mild urothelial wall thickening at the site of caliber transition of the right ureter (series 2/image 86). No left hydronephrosis. No renal masses. Bladder nondistended and obscured by streak  artifact from left hip hardware. Stomach/Bowel: Small hiatal hernia. Otherwise normal nondistended stomach. Normal caliber small bowel with no small bowel wall thickening. Normal appendix. Mild sigmoid diverticulosis, with no large bowel wall thickening or acute pericolonic fat stranding. Vascular/Lymphatic: Atherosclerotic nonaneurysmal abdominal aorta. Patent portal, splenic, hepatic and renal veins. Stable mildly enlarged 1.0 cm gastrohepatic ligament lymph node (series 2/image 48). Enlarged 1.4 cm porta hepatis node (series 2/image 51), previously 1.3 cm on 02/15/2019 CT, not appreciably changed. Stable enlarged 1.3 cm portacaval node (series 2/image 58). Enlarged 2.2 cm aortocaval node (series 2/image 53), previously 1.9 cm, mildly increased. Enlarged 1.7 cm left para-aortic node (series 2/image 60), previously 1.6 cm, not substantially changed. No pathologically enlarged pelvic lymph nodes. Reproductive: Grossly normal uterus.  No adnexal mass. Other: No pneumoperitoneum, ascites or focal fluid collection. Upper omental 1.3 cm soft tissue nodule (series 2/image 68), previously a 1.1 cm, slightly increased. Adjacent 0.7 cm upper omental soft tissue nodule (series 2/image 67), previously 0.5 cm, slightly increased. Small fat containing umbilical hernia. Musculoskeletal: Several sclerotic lesions scattered throughout the lumbar spine, upper sacrum, bilateral iliac bones and right pubic rami are unchanged since 02/15/2019 CT. No new osseous lesions. Left total hip arthroplasty. Mild lumbar spondylosis. IMPRESSION: 1. Findings suggest mild progression of metastatic disease. 2. Left retrocrural and aortocaval lymphadenopathy is  mildly increased. Lower right paraesophageal, gastrohepatic ligament, porta hepatis, portacaval and left para-aortic lymphadenopathy is stable. 3. Upper omental soft tissue nodules are slightly increased and likely represent peritoneal metastases. 4. Three scattered subcentimeter hypodense  liver lesions, one of which is new and two of which are stable, suspicious for liver metastases. 5. 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 Well-positioned CBD stent with pneumobilia indicating stent patency. 6. 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 No discrete stone or mass, although there is mild urothelial wall thickening at the site of caliber transition in the right ureter. Findings are indeterminate for benign treatment related or malignant right ureteral stricture. 7. 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 Sclerotic osseous metastases throughout the chest are new since most recent comparison chest imaging of 11/09/2018 PET-CT, compatible with response to therapy. 8. Aortic Atherosclerosis (ICD10-I70.0). Additional chronic findings as detailed. Electronically Signed   By: Ilona Sorrel M.D.   On: 03/31/2019 13:25   MR Lumbar Spine W Wo Contrast  Result Date: 03/10/2019 CLINICAL DATA:  Pancreatic cancer. Osseous metastases on CT. EXAM: MRI LUMBAR SPINE WITHOUT AND WITH CONTRAST TECHNIQUE: Multiplanar and multiecho pulse sequences of the lumbar spine were obtained without and with intravenous contrast. CONTRAST:  9m GADAVIST GADOBUTROL 1 MMOL/ML IV SOLN COMPARISON:  Abdominopelvic CT 02/15/2019. PET-CT 11/09/2018. FINDINGS: Segmentation: Conventional anatomy assumed, with the last open disc space designated L5-S1.Concordant with previous imaging. Alignment: Stable slight degenerative anterolisthesis at L3-4. Vertebrae: As seen on the recent CT, there is widespread osseous metastatic disease to the lumbar spine, sacrum and both iliac bones. Representative lesions on series 8 include a 2.2 cm lesion in the L2 vertebral body (image 12), a 2.4 cm lesion in the L4 vertebral body (image 26)  and a 2.8 cm left iliac lesion on image 35. Stable mild superior endplate compression deformity at L1 without residual marrow edema. No evidence of acute pathologic fracture. These osseous metastases enhance following contrast. Conus medullaris: Extends to the L1 level and appears normal. No abnormal intradural enhancement or significant epidural tumor. Paraspinal and other soft tissues: There are multiple enlarged retroperitoneal lymph nodes consistent with nodal metastases. These are grossly stable compared with the recent CT, measuring up to 1.6 cm short axis in the aortocaval space (image 10/8). Disc levels: No significant disc space findings at T12-L1 or L1-2. L2-3: Mild disc bulging eccentric to the left, facet and ligamentous hypertrophy. Borderline spinal stenosis with mild left foraminal narrowing. L3-4: Loss of disc height with annular disc bulging. There is moderate facet and ligamentous hypertrophy accounting for the grade 1 anterolisthesis. These factors contribute to severe spinal stenosis with moderate narrowing of both lateral recesses and mild left foraminal narrowing. L4-5: Relatively preserved disc height. Mild disc bulging with moderate facet and ligamentous hypertrophy. Mild spinal stenosis. No nerve root encroachment. L5-S1: Mild disc bulging with moderate asymmetric right-sided facet hypertrophy. No significant spinal stenosis or nerve root encroachment. IMPRESSION: 1. 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 No evidence of acute pathologic fracture. 2. Stable mild superior endplate compression deformity at L1. No residual marrow edema. 3. Stable retroperitoneal nodal metastases. 4. Severe multifactorial spinal stenosis at L3-4 with moderate narrowing of both lateral recesses and mild left foraminal narrowing. 5. Stable mild multifactorial  spinal stenosis at L2-3 and L4-5. Electronically  Signed   By: Richardean Sale M.D.   On: 03/10/2019 20:13   NM Bone Scan Whole Body  Result Date: 03/31/2019 CLINICAL DATA:  Stage IV pancreatic cancer EXAM: NUCLEAR MEDICINE WHOLE BODY BONE SCAN TECHNIQUE: Whole body anterior and posterior images were obtained approximately 3 hours after intravenous injection of radiopharmaceutical. RADIOPHARMACEUTICALS:  22.989 mCi Technetium-92mMDP IV COMPARISON:  CT chest abdomen pelvis 03/31/2019, PET-CT 11/09/2018 FINDINGS: Numerous sites of abnormal osseous tracer accumulation are identified consistent with widespread osseous metastatic disease. These include calvaria, BILATERAL ribs, sternum, thoracic/lumbar spine, and pelvis. In addition, numerous foci of abnormal uptake are seen within BILATERAL humeri, BILATERAL femora, and proximal forearms likely radius. Many of these are identified on accompanying CT as well. RIGHT hydronephrosis and hydroureter are identified with abnormal thickening at the mid RIGHT ureter series 2, image 86. LEFT hip prosthesis incidentally noted. IMPRESSION: Widespread osseous metastatic disease including long bone metastases as above. RIGHT hydronephrosis and proximal hydroureter. Electronically Signed   By: MLavonia DanaM.D.   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 Restaging. EXAM: CT CHEST, ABDOMEN, AND PELVIS WITH CONTRAST TECHNIQUE: Multidetector CT imaging of the chest, abdomen and pelvis was performed following the standard protocol during bolus administration of intravenous contrast. CONTRAST:  103mOMNIPAQUE IOHEXOL 300 MG/ML  SOLN COMPARISON:  02/15/2019 CT abdomen/pelvis.  11/09/2018 PET-CT. FINDINGS: CT CHEST FINDINGS Cardiovascular: Top-normal heart size. Right internal jugular Port-A-Cath terminates at the cavoatrial junction. No significant pericardial effusion/thickening. Atherosclerotic  nonaneurysmal thoracic aorta. Normal caliber pulmonary arteries. No central pulmonary emboli. Mediastinum/Nodes: Heterogeneous thyroid gland with subcentimeter bilateral thyroid nodules requiring no follow-up. Unremarkable esophagus. No axillary or hilar adenopathy. Mildly enlarged 1.0 cm lower right paraesophageal node (series 2/image 34), stable since 11/09/2018 PET-CT. Left retrocrural enlarged 1.4 cm node (series 2/image 46), increased from 0.9 cm on 02/15/2019 CT abdomen. No additional pathologically enlarged mediastinal nodes. Lungs/Pleura: No pneumothorax. No pleural effusion. Anterior right middle lobe 3 mm solid pulmonary nodule (series 4/image 80) is stable since 11/09/2018 PET-CT. Mild platelike scarring versus atelectasis at both lung bases. No acute consolidative airspace disease, lung masses or new significant pulmonary nodules. Musculoskeletal: Several new sclerotic lesions throughout thoracic osseous structures including the sternum, bilateral ribs, left superior scapula, left clavicle and thoracic spine, new since 11/09/2018 PET-CT. Moderate thoracic spondylosis. CT ABDOMEN PELVIS FINDINGS Hepatobiliary: Normal liver size. Simple lateral segment left liver lobe 1.0 cm cyst is stable. Subcentimeter hypodense segment 4B left liver lobe lesion is new (series 2/image 57). Subcentimeter hypodense segment 6 right liver lesion (series 2/image 58) is stable. Subcentimeter segment 8 right liver dome hypodense lesion (series 2/image 39) is stable. No additional new liver lesions. Numerous calcified gallstones throughout the gallbladder with stable mild diffuse gallbladder wall thickening. Pneumobilia in the intrahepatic left liver lobe bile ducts with stable mild central intrahepatic biliary ductal dilatation. CBD stent well positioned with distal tip in the duodenal lumen just beyond the ampulla. Pneumobilia throughout the stent lumen. Pancreas: Stable mild fullness of the pancreatic head with an distinct  surrounding soft tissue planes and no discrete measurable pancreatic mass by CT. Stable mild diffuse pancreatic duct dilation. No new pancreatic lesions. Spleen: Normal size. No mass. Adrenals/Urinary Tract: Normal adrenals. New moderate to severe right hydroureteronephrosis to the level of the lower right lumbar ureter without discrete obstructing stone or mass, although  with mild urothelial wall thickening at the site of caliber transition of the right ureter (series 2/image 86). No left hydronephrosis. No renal masses. Bladder nondistended and obscured by streak artifact from left hip hardware. Stomach/Bowel: Small hiatal hernia. Otherwise normal nondistended stomach. Normal caliber small bowel with no small bowel wall thickening. Normal appendix. Mild sigmoid diverticulosis, with no large bowel wall thickening or acute pericolonic fat stranding. Vascular/Lymphatic: Atherosclerotic nonaneurysmal abdominal aorta. Patent portal, splenic, hepatic and renal veins. Stable mildly enlarged 1.0 cm gastrohepatic ligament lymph node (series 2/image 48). Enlarged 1.4 cm porta hepatis node (series 2/image 51), previously 1.3 cm on 02/15/2019 CT, not appreciably changed. Stable enlarged 1.3 cm portacaval node (series 2/image 58). Enlarged 2.2 cm aortocaval node (series 2/image 53), previously 1.9 cm, mildly increased. Enlarged 1.7 cm left para-aortic node (series 2/image 60), previously 1.6 cm, not substantially changed. No pathologically enlarged pelvic lymph nodes. Reproductive: Grossly normal uterus.  No adnexal mass. Other: No pneumoperitoneum, ascites or focal fluid collection. Upper omental 1.3 cm soft tissue nodule (series 2/image 68), previously a 1.1 cm, slightly increased. Adjacent 0.7 cm upper omental soft tissue nodule (series 2/image 67), previously 0.5 cm, slightly increased. Small fat containing umbilical hernia. Musculoskeletal: Several sclerotic lesions scattered throughout the lumbar spine, upper sacrum,  bilateral iliac bones and right pubic rami are unchanged since 02/15/2019 CT. No new osseous lesions. Left total hip arthroplasty. Mild lumbar spondylosis. IMPRESSION: 1. Findings suggest mild progression of metastatic disease. 2. Left retrocrural and aortocaval lymphadenopathy is mildly increased. Lower right paraesophageal, gastrohepatic ligament, porta hepatis, portacaval and left para-aortic lymphadenopathy is stable. 3. Upper omental soft tissue nodules are slightly increased and likely represent peritoneal metastases. 4. Three scattered subcentimeter hypodense liver lesions, one of which is new and two of which are stable, suspicious for liver metastases. 5. 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 Well-positioned CBD stent with pneumobilia indicating stent patency. 6. 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 No discrete stone or mass, although there is mild urothelial wall thickening at the site of caliber transition in the right ureter. Findings are indeterminate for benign treatment related or malignant right ureteral stricture. 7. 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 Sclerotic osseous metastases throughout the chest are new since most recent comparison chest imaging of 11/09/2018 PET-CT, compatible with response to therapy. 8. Aortic Atherosclerosis (ICD10-I70.0). Additional chronic findings as detailed. Electronically Signed   By: Ilona Sorrel M.D.   On: 03/31/2019 13:25   CT Abdomen Pelvis W Contrast  Result Date: 02/15/2019 CLINICAL DATA:  Pancreatic adenocarcinoma. EXAM: CT ABDOMEN AND PELVIS WITH CONTRAST TECHNIQUE: Multidetector CT imaging of the abdomen and pelvis was performed using the standard protocol following bolus administration of intravenous contrast. CONTRAST:   45m OMNIPAQUE IOHEXOL 300 MG/ML  SOLN COMPARISON:  10/20/2018 FINDINGS: Lower chest: Unremarkable. Hepatobiliary: 8 mm hypodensity in the inferior right liver adjacent to the gallbladder (25/2) is new in the interval. 9 mm hypodensity in the tip of the left liver is stable and likely a cyst. Another 5 mm hypodensity in the dome of the liver is stable. Multiple gallstones evident with gas visible in the gallbladder lumen. Intrahepatic biliary duct dilatation seen previously has decreased with common bile duct stent visualized in situ. Pancreas: Pancreatic parenchymal atrophy noted with dilatation of the main duct in the body and tail of pancreas.  No discrete pancreatic mass lesion evident although an ill-defined area of hypoenhancement in the head of the pancreas measures approximately 3 x 2.6 cm today. Spleen: No splenomegaly. No focal mass lesion. Adrenals/Urinary Tract: No adrenal nodule or mass. Kidneys unremarkable. No evidence for hydroureter. The urinary bladder appears normal for the degree of distention. Stomach/Bowel: Tiny hiatal hernia. Stomach otherwise unremarkable. Duodenum is normally positioned as is the ligament of Treitz. No small bowel wall thickening. No small bowel dilatation. The terminal ileum is normal. The appendix is normal. No gross colonic mass. No colonic wall thickening. Diverticular changes are noted in the left colon without evidence of diverticulitis. Vascular/Lymphatic: There is abdominal aortic atherosclerosis without aneurysm. Portal vein, superior mesenteric vein, and splenic vein are patent. Celiac axis and SMA are patent. Mild lymphadenopathy in the gastrohepatic and hepato duodenal ligaments is not substantially changed. Index gastrohepatic ligament lymph node measures 9 mm short axis on 14/2. 15 mm retrocaval node on 21/2 was 18 mm short axis previously. Left para-aortic node measuring 1.4 cm short axis on 23/2 was 1.4 cm previously. No pelvic sidewall lymphadenopathy.  Reproductive: The uterus is unremarkable.  There is no adnexal mass. Other: Trace free fluid noted in the cul-de-sac. Musculoskeletal: Status post left total hip replacement. Interval development of multiple lytic and sclerotic bone lesions consistent with metastatic involvement. 2.8 cm lesion identified left iliac bone on 47/2. New 13 mm lytic and sclerotic lesion in the left L2 vertebral body noted on 24/2. 14 mm sclerotic lesion in the sacrum visible on 51/2. IMPRESSION: 1. Interval development of multiple lytic and sclerotic bone lesions consistent with bony metastatic involvement. 2. Decrease in intrahepatic biliary duct dilatation with common bile duct stent visualized in situ. 3. Ill-defined hypoenhancing pancreatic head mass measuring 3.0 x 2.6 cm today. 4. New 8 mm hypodensity in the inferior right liver adjacent to the gallbladder. Attention on follow-up recommended. 5. Upper abdominal lymphadenopathy shows no substantial change. 6.  Aortic Atherosclerois (ICD10-170.0) Electronically Signed   By: Misty Stanley M.D.   On: 02/15/2019 10:04   US Venous Img Lower Bilateral  Result Date: 04/05/2019 CLINICAL DATA:  Acute bilateral lower extremity swelling. EXAM: BILATERAL LOWER EXTREMITY VENOUS DOPPLER ULTRASOUND TECHNIQUE: Gray-scale sonography with graded compression, as well as color Doppler and duplex ultrasound were performed to evaluate the lower extremity deep venous systems from the level of the common femoral vein and including the common femoral, femoral, profunda femoral, popliteal and calf veins including the posterior tibial, peroneal and gastrocnemius veins when visible. The superficial great saphenous vein was also interrogated. Spectral Doppler was utilized to evaluate flow at rest and with distal augmentation maneuvers in the common femoral, femoral and popliteal veins. COMPARISON:  None. FINDINGS: RIGHT LOWER EXTREMITY Common Femoral Vein: No evidence of thrombus. Normal compressibility,  respiratory phasicity and response to augmentation. Saphenofemoral Junction: No evidence of thrombus. Normal compressibility and flow on color Doppler imaging. Profunda Femoral Vein: No evidence of thrombus. Normal compressibility and flow on color Doppler imaging. Femoral Vein: No evidence of thrombus. Normal compressibility, respiratory phasicity and response to augmentation. Popliteal Vein: No evidence of thrombus. Normal compressibility, respiratory phasicity and response to augmentation. Calf Veins: No evidence of thrombus. Normal compressibility and flow on color Doppler imaging. Venous Reflux:  None. Other Findings: Probable 3.7 cm Baker's cyst is noted in popliteal fossa. LEFT LOWER EXTREMITY Common Femoral Vein: No evidence of thrombus. Normal compressibility, respiratory phasicity and response to augmentation. Saphenofemoral Junction: No evidence of thrombus. Normal compressibility and flow  on color Doppler imaging. Profunda Femoral Vein: No evidence of thrombus. Normal compressibility and flow on color Doppler imaging. Femoral Vein: No evidence of thrombus. Normal compressibility, respiratory phasicity and response to augmentation. Popliteal Vein: No evidence of thrombus. Normal compressibility, respiratory phasicity and response to augmentation. Calf Veins: No evidence of thrombus. Normal compressibility and flow on color Doppler imaging. Venous Reflux:  None. Other Findings: Probable 1.3 cm Baker's cyst seen in left popliteal fossa. IMPRESSION: No evidence of deep venous thrombosis in either lower extremity. Electronically Signed   By: Marijo Conception M.D.   On: 04/05/2019 15:21   DG C-Arm 1-60 Min-No Report  Result Date: 01/28/2019 Fluoroscopy was utilized by the requesting physician.  No radiographic interpretation.    [facility] labs CA 19-9 was check on 10/21/2018, level was elevated at 1500.    ASSESSMENT & PLAN:  1. Malignant neoplasm of head of pancreas ([place])   2. Diarrhea,  unspecified type   3. Neuropathy   4. Normocytic anemia   5. Encounter for antineoplastic chemotherapy   6. Mucositis   7. Hypokalemia   . # Stage IV pancreatic cancer-7Labs are reviewed and discussed with patient CA19.9 1500--> 2101-->1614-->  897-->940--> 1972-->3374--> 6032-> pending. Status post 1 cycle of second line treatment with 5-FU and liposomal irinotecan. She tolerates with moderate difficulties. Labs reviewed and discussed with patient. Counts acceptable to proceed with cycle two 5-FU and liposomal irinotecan.  #Diarrhea, chemotherapy-induced has improved after taking Imodium.  Continue as instructed. #Mucositis, likely secondary to 5-FU.  I recommend patient to use Magic mouthwash with lidocaine prior to each meal.  She may also use her pain medication to help with the pain if not improved with mouthwash. 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  I will start her on Valtrex 500 mg twice daily for prophylaxis.  #Hypokalemia, patient will receive 20 mEq potassium chloride along with her chemotherapy treatments today.  Encourage potassium rich food.  #Macrocytic anemia, normal vitamin B12 and folate level.  Likely secondary to chemotherapy. Hemoglobin remained stable.  Continue to monitor. #Neuropathy, continue gabapentin 300 mg 2 times daily. #Goals of care discussion, discussed with both patient and husband.  Both are aware that patient's condition is not curable.  Prognosis is poor.  She is currently on second line chemotherapy with response rate about 15%.  I will discussed with patient for genetic testing at the next visit.  #Bone metastasis, patient has been on Zometa monthly.  She will get it on 04/21/2019. All questions were answered. The patient knows to call the clinic with any problems questions or concerns.  Return of visit: Follow-up in 1 week. We spent sufficient time to discuss many aspect of care, questions were answered to patient's  satisfaction.  Earlie Server, MD, PhD Hematology Oncology [place] [facility] 04/19/2019

## 2019-04-19 NOTE — Progress Notes (Signed)
Patient reports her mouth feels raw and is not able to eat.

## 2019-04-20 ENCOUNTER — Ambulatory Visit
Admission: RE | Admit: 2019-04-20 | Discharge: 2019-04-20 | Disposition: A | Discharge status: Home / Self Care | Payer: Medicare Other | Source: Ambulatory Visit | Attending: Radiation Oncology | Admitting: Radiation Oncology

## 2019-04-20 ENCOUNTER — Other Ambulatory Visit: Payer: Self-pay

## 2019-04-20 DIAGNOSIS — Z510 Encounter for antineoplastic radiation therapy: Secondary | ICD-10-CM | POA: Diagnosis not present

## 2019-04-20 LAB — CANCER ANTIGEN 19-9: CA 19-9: 4365 U/mL — ABNORMAL HIGH (ref 0–35)

## 2019-04-21 ENCOUNTER — Other Ambulatory Visit: Payer: Self-pay

## 2019-04-21 ENCOUNTER — Inpatient Hospital Stay: Payer: Medicare Other

## 2019-04-21 VITALS — BP 131/58 | HR 81 | Temp 96.7°F | Resp 18

## 2019-04-21 DIAGNOSIS — Z5111 Encounter for antineoplastic chemotherapy: Secondary | ICD-10-CM | POA: Diagnosis not present

## 2019-04-21 DIAGNOSIS — E876 Hypokalemia: Secondary | ICD-10-CM

## 2019-04-21 MED ORDER — ZOLEDRONIC ACID 4 MG/5ML IV CONC
3.3000 mg | Freq: Once | INTRAVENOUS | Status: AC
  Administered 2019-04-21: 3.3 mg via INTRAVENOUS
  Filled 2019-04-21: qty 4.13

## 2019-04-21 MED ORDER — HEPARIN SOD (PORK) LOCK FLUSH 100 UNIT/ML IV SOLN
500.0000 [IU] | Freq: Once | INTRAVENOUS | Status: AC | PRN
  Administered 2019-04-21: 500 [IU]
  Filled 2019-04-21: qty 5

## 2019-04-21 MED ORDER — HEPARIN SOD (PORK) LOCK FLUSH 100 UNIT/ML IV SOLN
INTRAVENOUS | Status: AC
  Filled 2019-04-21: qty 5

## 2019-04-21 MED ORDER — SODIUM CHLORIDE 0.9 % IV SOLN
Freq: Once | INTRAVENOUS | Status: AC
  Filled 2019-04-21: qty 250

## 2019-04-21 NOTE — Progress Notes (Signed)
1330: pt reports that starting 04/20/19 she began experiencing increased fatigue and feeling "shaky" when up and moving. Pt reports that she is eating and drinking but "probably not enough".  Dr. Tasia Catchings aware, pt offered to see an NP and offered IVFs per Tasia Catchings. Pt declines both and states she will increase fluid intake. Pt educated to call clinic if symptoms persist or worsens, pt verbalizes understanding. Per Dr. Tasia Catchings okay to proceed with treatment of Zometa and discharge home. Calcium 8.4, per Dr. Tasia Catchings okay to proceed with Zometa.   1400: pt stable at discharge.

## 2019-04-25 ENCOUNTER — Telehealth: Payer: Self-pay | Admitting: Licensed Clinical Social Worker

## 2019-04-25 NOTE — Telephone Encounter (Signed)
Revealed negative genetic testing.  Revealed that a VUS in MSH2 was identified.  We discussed that we do not know why she has pancreatic cancer. It could be due to a different gene that we are not testing, or something our current technology cannot pick up.  It will be important for her/ her family to keep in contact with genetics to learn if additional testing may be needed in the future.

## 2019-04-26 ENCOUNTER — Other Ambulatory Visit: Payer: Self-pay

## 2019-04-26 ENCOUNTER — Encounter: Payer: Self-pay | Admitting: Oncology

## 2019-04-26 ENCOUNTER — Encounter: Payer: Self-pay | Admitting: Licensed Clinical Social Worker

## 2019-04-26 ENCOUNTER — Inpatient Hospital Stay: Payer: Medicare Other

## 2019-04-26 ENCOUNTER — Ambulatory Visit: Payer: Self-pay | Admitting: Licensed Clinical Social Worker

## 2019-04-26 ENCOUNTER — Inpatient Hospital Stay (HOSPITAL_BASED_OUTPATIENT_CLINIC_OR_DEPARTMENT_OTHER): Payer: Medicare Other | Admitting: Oncology

## 2019-04-26 VITALS — BP 132/77 | HR 96 | Temp 98.5°F | Wt 133.4 lb

## 2019-04-26 VITALS — Resp 20

## 2019-04-26 DIAGNOSIS — Z5111 Encounter for antineoplastic chemotherapy: Secondary | ICD-10-CM | POA: Diagnosis not present

## 2019-04-26 DIAGNOSIS — R197 Diarrhea, unspecified: Secondary | ICD-10-CM | POA: Diagnosis not present

## 2019-04-26 DIAGNOSIS — D649 Anemia, unspecified: Secondary | ICD-10-CM | POA: Diagnosis not present

## 2019-04-26 DIAGNOSIS — Z1379 Encounter for other screening for genetic and chromosomal anomalies: Secondary | ICD-10-CM

## 2019-04-26 DIAGNOSIS — E876 Hypokalemia: Secondary | ICD-10-CM

## 2019-04-26 DIAGNOSIS — G629 Polyneuropathy, unspecified: Secondary | ICD-10-CM

## 2019-04-26 DIAGNOSIS — C250 Malignant neoplasm of head of pancreas: Secondary | ICD-10-CM

## 2019-04-26 DIAGNOSIS — B370 Candidal stomatitis: Secondary | ICD-10-CM

## 2019-04-26 DIAGNOSIS — R7989 Other specified abnormal findings of blood chemistry: Secondary | ICD-10-CM

## 2019-04-26 DIAGNOSIS — D490 Neoplasm of unspecified behavior of digestive system: Secondary | ICD-10-CM

## 2019-04-26 DIAGNOSIS — M7989 Other specified soft tissue disorders: Secondary | ICD-10-CM

## 2019-04-26 DIAGNOSIS — Z95828 Presence of other vascular implants and grafts: Secondary | ICD-10-CM

## 2019-04-26 DIAGNOSIS — K1230 Oral mucositis (ulcerative), unspecified: Secondary | ICD-10-CM

## 2019-04-26 LAB — CBC WITH DIFFERENTIAL/PLATELET
Abs Immature Granulocytes: 0.03 10*3/uL (ref 0.00–0.07)
Basophils Absolute: 0 10*3/uL (ref 0.0–0.1)
Basophils Relative: 1 %
Eosinophils Absolute: 0.3 10*3/uL (ref 0.0–0.5)
Eosinophils Relative: 9 %
HCT: 31 % — ABNORMAL LOW (ref 36.0–46.0)
Hemoglobin: 10.4 g/dL — ABNORMAL LOW (ref 12.0–15.0)
Immature Granulocytes: 1 %
Lymphocytes Relative: 6 %
Lymphs Abs: 0.2 10*3/uL — ABNORMAL LOW (ref 0.7–4.0)
MCH: 34.4 pg — ABNORMAL HIGH (ref 26.0–34.0)
MCHC: 33.5 g/dL (ref 30.0–36.0)
MCV: 102.6 fL — ABNORMAL HIGH (ref 80.0–100.0)
Monocytes Absolute: 0.3 10*3/uL (ref 0.1–1.0)
Monocytes Relative: 9 %
Neutro Abs: 2.4 10*3/uL (ref 1.7–7.7)
Neutrophils Relative %: 74 %
Platelets: 149 10*3/uL — ABNORMAL LOW (ref 150–400)
RBC: 3.02 MIL/uL — ABNORMAL LOW (ref 3.87–5.11)
RDW: 15 % (ref 11.5–15.5)
WBC: 3.3 10*3/uL — ABNORMAL LOW (ref 4.0–10.5)
nRBC: 0 % (ref 0.0–0.2)

## 2019-04-26 LAB — COMPREHENSIVE METABOLIC PANEL
ALT: 17 U/L (ref 0–44)
AST: 23 U/L (ref 15–41)
Albumin: 2.8 g/dL — ABNORMAL LOW (ref 3.5–5.0)
Alkaline Phosphatase: 266 U/L — ABNORMAL HIGH (ref 38–126)
Anion gap: 10 (ref 5–15)
BUN: 14 mg/dL (ref 8–23)
CO2: 21 mmol/L — ABNORMAL LOW (ref 22–32)
Calcium: 7.9 mg/dL — ABNORMAL LOW (ref 8.9–10.3)
Chloride: 102 mmol/L (ref 98–111)
Creatinine, Ser: 0.6 mg/dL (ref 0.44–1.00)
GFR calc Af Amer: >60 mL/min (ref >60)
GFR calc non Af Amer: >60 mL/min (ref >60)
Glucose, Bld: 141 mg/dL — ABNORMAL HIGH (ref 70–99)
Potassium: 3.1 mmol/L — ABNORMAL LOW (ref 3.5–5.1)
Sodium: 133 mmol/L — ABNORMAL LOW (ref 135–145)
Total Bilirubin: 0.7 mg/dL (ref 0.3–1.2)
Total Protein: 6.3 g/dL — ABNORMAL LOW (ref 6.5–8.1)

## 2019-04-26 MED ORDER — NYSTATIN 100000 UNIT/ML MT SUSP: 5.0000 mL | Freq: Four times a day (QID) | OROMUCOSAL | 0 refills | Status: AC

## 2019-04-26 MED ORDER — DIPHENOXYLATE-ATROPINE 2.5-0.025 MG PO TABS: 1.0000 | ORAL_TABLET | Freq: Four times a day (QID) | ORAL | 0 refills | Status: AC | PRN

## 2019-04-26 MED ORDER — SODIUM CHLORIDE 0.9 % IV SOLN
INTRAVENOUS | Status: DC
  Filled 2019-04-26 (×2): qty 1000

## 2019-04-26 MED ORDER — SODIUM CHLORIDE 0.9% FLUSH
10.0000 mL | Freq: Once | INTRAVENOUS | Status: AC
  Administered 2019-04-26: 13:00:00 10 mL via INTRAVENOUS
  Filled 2019-04-26: qty 10

## 2019-04-26 MED ORDER — HEPARIN SOD (PORK) LOCK FLUSH 100 UNIT/ML IV SOLN
500.0000 [IU] | Freq: Once | INTRAVENOUS | Status: AC
  Administered 2019-04-26: 500 [IU] via INTRAVENOUS
  Filled 2019-04-26: qty 5

## 2019-04-26 MED ORDER — HEPARIN SOD (PORK) LOCK FLUSH 100 UNIT/ML IV SOLN
INTRAVENOUS | Status: AC
  Filled 2019-04-26: qty 5

## 2019-04-26 MED ORDER — SODIUM CHLORIDE 0.9% FLUSH
10.0000 mL | INTRAVENOUS | Status: DC | PRN
  Administered 2019-04-26: 10 mL via INTRAVENOUS
  Filled 2019-04-26: qty 10

## 2019-04-26 NOTE — Progress Notes (Signed)
Pt here for follow up. Patient reports feeling tired and having diarrhea all weekend. Took antidiarrheal but was not effective.

## 2019-04-26 NOTE — Progress Notes (Addendum)
Hematology/Oncology follow up note [facility] Telephone:([phone] Fax:([phone]) [phone]   Patient Care Team: Adin Hector, MD as PCP - General (Internal Medicine) Clent Jacks, RN as Oncology Nurse Navigator  REFERRING PROVIDER: Adin Hector, MD  CHIEF COMPLAINTS/REASON FOR VISIT:  Follow-up for pancreatic cancer   HISTORY OF PRESENTING ILLNESS:   Melissa [name] is a  79 y.o.  female with PMH listed below was seen in consultation at the request of  Adin Hector, MD  for evaluation of abnormal pancreatic disease. Patient was recently seen by primary care provider Dr. Caryl Comes for evaluation of nausea, diarrhea and jaundice.  Blood work on 10/19/2018 showed potassium 2.9, bilirubin 15, alkaline phosphatase 478, AST 114, ALT 95. Abdomen pelvis CT scan on 10/20/2018 showed moderate to market intra-and extra hepatic biliary dilation with mild diffuse dilatation of the pancreatic duct. Patient has had poor appetite and has lost a 5 to 10 pounds recently. She also had diarrhea.  She noticed sand like stool for 1- 2 weeks.  She takes care of her 62 year old mother and recently feels she is not able to take care of her anymore due to progressively worsening weakness and fatigue.  She placed her mother to assisted living yesterday. She has had discussion with Dr. Caryl Comes about her blood work and CT scans.  She understands that there is a strong suspicion of cancer.  Nonessential medication has been stopped. CA 19-9 was check on 10/21/2018, level was elevated at 1500.  Today she denies any pain.  Itchy all over her body.. Per patient's request, patient's daughter [name] was [name] and [name] was able to hear entire clinical encounter conversation and participate in the reported history and discussion.  # Patient was admitted from 10/29/2018-10/31/2018.  Status post ERCP and biliary stenting.  Patient had duodenum ampulla biopsy showed nondiagnostic.  Negative for  invasive carcinoma. Bilirubin trended down to 11 at the time of discharge on 10/31/2018.  #PET scan images were independent reviewed by me and discussed with patient. cTxN2 M1a Patient has at least M1 a disease due to retroperitoneal/peri-aortic lymph node involvement,  sclerotic lesion of right proximal femur with mild hypermetabolic activity, as well as left sacrum  # EUS biopsy of pancreatic mass showed positive for malignancy, Adenocarcinoma. There is not enough tissue for NGS testing.  Attempt to at MMR, still not enough tissue to complete the testing.  # liquid biopsy sent on 03/22/2019- no reportable alteration  blood tumor burden 27mts/mb  INTERVAL HISTORY Melissa BRIDGETTis a 79y.o. female who has above history reviewed by me today presents for follow up visit for management of stage IV pancreatic cancer, follow-up for tolerability of second line chemotherapy with 5-FU and liposomal irinotecan. Patient reports feeling fatigued. She was accompanied by her husband. Patient reports having multiple diarrhea episodes after last treatment during the past weekend. She has used Imodium with no significant improvement.  She did not seek medical advice is. Today she reports that diarrhea has slightly improved.  3 loose bowel meant in the past 24 hours. Appetite is fair. Denies any mouth sores. Denies any fever, chills, nausea. She finished palliative radiation last week Back pain has improved   Review of Systems  Constitutional: Positive for fatigue. Negative for appetite change, chills and fever.  HENT:   Negative for hearing loss and voice change.   Eyes: Negative for eye problems and icterus.  Respiratory: Negative for chest tightness and cough.  Cardiovascular: Positive for leg swelling. Negative for chest pain.  Gastrointestinal: Positive for diarrhea. Negative for abdominal distention, abdominal pain and blood in stool.  Endocrine: Negative for hot flashes.  Genitourinary:  Negative for difficulty urinating and frequency.   Musculoskeletal: Positive for back pain. Negative for arthralgias.  Skin: Negative for itching and rash.  Neurological: Positive for numbness. Negative for extremity weakness.  Hematological: Negative for adenopathy.  Psychiatric/Behavioral: Negative for confusion.    MEDICAL HISTORY:  Past Medical History:  Diagnosis Date  . Glaucoma   . Hypertension   . Osteopenia   . Stroke [facility])     SURGICAL HISTORY: Past Surgical History:  Procedure Laterality Date  . BREAST BIOPSY Right    core- stereo- neg  . ERCP N/A 10/29/2018   Procedure: ENDOSCOPIC RETROGRADE CHOLANGIOPANCREATOGRAPHY (ERCP);  Surgeon: [name], MD;  Location: [facility] ENDOSCOPY;  Service: Endoscopy;  Laterality: N/A;  . ERCP N/A 01/28/2019   Procedure: ENDOSCOPIC RETROGRADE CHOLANGIOPANCREATOGRAPHY (ERCP) STENT REMOVAL;  Surgeon: [name], MD;  Location: [facility] ENDOSCOPY;  Service: Endoscopy;  Laterality: N/A;  . EUS N/A 11/18/2018   Procedure: FULL UPPER ENDOSCOPIC ULTRASOUND (EUS) RADIAL;  Surgeon: Holly Bodily, MD;  Location: [facility] ENDOSCOPY;  Service: Gastroenterology;  Laterality: N/A;  . HIP ARTHROPLASTY Left 01/02/2016   Procedure: ARTHROPLASTY BIPOLAR HIP (HEMIARTHROPLASTY);  Surgeon: Dereck Leep, MD;  Location: [facility] ORS;  Service: Orthopedics;  Laterality: Left;  . JOINT REPLACEMENT Left    THR  . PORTACATH PLACEMENT Right 11/26/2018   Procedure: INSERTION PORT-A-CATH;  Surgeon: Jules Husbands, MD;  Location: [facility] ORS;  Service: General;  Laterality: Right;  . TONSILLECTOMY      SOCIAL HISTORY: Social History   Socioeconomic History  . Marital status: Married    Spouse name: Not on file  . Number of children: Not on file  . Years of education: Not on file  . Highest education level: Not on file  Occupational History  . Not on file  Tobacco Use  . Smoking status: Never Smoker  . Smokeless tobacco: Never Used  Substance and Sexual Activity    . Alcohol use: No  . Drug use: Never  . Sexual activity: Not on file  Other Topics Concern  . Not on file  Social History Narrative  . Not on file   Social Determinants of Health   Financial Resource Strain:   . Difficulty of Paying Living Expenses: Not on file  Food Insecurity:   . Worried About Charity fundraiser in the Last Year: Not on file  . Ran Out of Food in the Last Year: Not on file  Transportation Needs:   . Lack of Transportation (Medical): Not on file  . Lack of Transportation (Non-Medical): Not on file  Physical Activity:   . Days of Exercise per Week: Not on file  . Minutes of Exercise per Session: Not on file  Stress:   . Feeling of Stress : Not on file  Social Connections:   . Frequency of Communication with Friends and Family: Not on file  . Frequency of Social Gatherings with Friends and Family: Not on file  . Attends Religious Services: Not on file  . Active Member of Clubs or Organizations: Not on file  . Attends Archivist Meetings: Not on file  . Marital Status: Not on file  Intimate Partner Violence:   . Fear of Current or Ex-Partner: Not on file  . Emotionally Abused: Not on file  . Physically Abused: Not  on file  . Sexually Abused: Not on file    FAMILY HISTORY: Family History  Problem Relation Age of Onset  . Kidney disease Father   . Breast cancer Neg Hx     ALLERGIES:  has No Known Allergies.  MEDICATIONS:  Current Outpatient Medications  Medication Sig Dispense Refill  . acetaminophen (TYLENOL) 500 MG tablet Take 500 mg by mouth every 6 (six) hours as needed.    Marland Kitchen amLODipine (NORVASC) 10 MG tablet Take 10 mg by mouth at bedtime.     Marland Kitchen dexamethasone (DECADRON) 4 MG tablet Take 2 tablets (8 mg total) by mouth daily. Start the day after irinotecan chemotherapy for 2 days. 8 tablet 5  . gabapentin (NEURONTIN) 300 MG capsule Take 1 capsule (300 mg total) by mouth 2 (two) times daily. 60 capsule 0  . lidocaine-prilocaine (EMLA)  cream Apply to affected area once 30 g 3  . loperamide (IMODIUM A-D) 2 MG tablet Take 1 tablet (2 mg total) by mouth See admin instructions. Take 2 at diarrhea onset , then 1 every 2hr until 12hrs with no BM. May take 2 every 4hrs at night. If diarrhea recurs repeat. 100 tablet 1  . magic mouthwash w/lidocaine SOLN Take 5 mLs by mouth 4 (four) times daily as needed for mouth pain. Sig: Swish/Swallow 5-10 ml four times a day as needed. Dispense 480 ml. 1RF 480 mL 1  . ondansetron (ZOFRAN) 8 MG tablet Take 1 tablet by mouth as needed.    Marland Kitchen oxyCODONE (ROXICODONE) 5 MG immediate release tablet Take 1 tablet (5 mg total) by mouth every 6 (six) hours as needed for moderate pain or severe pain. 60 tablet 0  . potassium chloride (K-DUR) 10 MEQ tablet Take 1 tablet (10 mEq total) by mouth daily. (Patient taking differently: Take 10 mEq by mouth 2 (two) times daily. ) 30 tablet 0  . prochlorperazine (COMPAZINE) 10 MG tablet Take 1 tablet (10 mg total) by mouth every 6 (six) hours as needed (NAUSEA). 30 tablet 1  . valACYclovir (VALTREX) 500 MG tablet Take 1 tablet (500 mg total) by mouth 2 (two) times daily. 60 tablet 0  . diphenoxylate-atropine (LOMOTIL) 2.5-0.025 MG tablet Take 1-2 tablets by mouth 4 (four) times daily as needed for diarrhea or loose stools. 60 tablet 0  . docusate sodium (COLACE) 100 MG capsule Take 1 capsule (100 mg total) by mouth daily as needed for mild constipation or moderate constipation. Do not take if you have loose stools (Patient not taking: Reported on 04/26/2019) 60 capsule 2  . nystatin (MYCOSTATIN) 100000 UNIT/ML suspension Take 5 mLs (500,000 Units total) by mouth 4 (four) times daily. 473 mL 0   No current facility-administered medications for this visit.     PHYSICAL EXAMINATION: ECOG PERFORMANCE STATUS: 2 - Symptomatic, <50% confined to bed Vitals:   04/26/19 1324  BP: 132/77  Pulse: 96  Temp: 98.5 F (36.9 C)   Filed Weights   04/26/19 1324  Weight: 133 lb 6.4  oz (60.5 kg)    Physical Exam Constitutional:      General: She is not in acute distress.    Appearance: She is ill-appearing.     Comments: Frail, sits in wheelchair.  HENT:     Head: Normocephalic and atraumatic.     Comments: Alopecia    Mouth/Throat:     Comments: Mild mucositis, + thrush Eyes:     General: No scleral icterus.    Pupils: Pupils are equal, round, and reactive  to light.  Cardiovascular:     Rate and Rhythm: Normal rate and regular rhythm.     Heart sounds: Normal heart sounds.  Pulmonary:     Effort: Pulmonary effort is normal. No respiratory distress.     Breath sounds: Normal breath sounds. No wheezing.  Abdominal:     General: Bowel sounds are normal. There is no distension.     Palpations: Abdomen is soft. There is no mass.     Tenderness: There is no abdominal tenderness.  Musculoskeletal:        General: Swelling present. No deformity. Normal range of motion.     Cervical back: Normal range of motion and neck supple.  Skin:    General: Skin is warm and dry.     Coloration: Skin is not jaundiced.     Findings: No erythema or rash.  Neurological:     Mental Status: She is alert and oriented to person, place, and time. Mental status is at baseline.     Cranial Nerves: No cranial nerve deficit.     Coordination: Coordination normal.  Psychiatric:        Mood and Affect: Mood normal.     LABORATORY DATA:  I have reviewed the data as listed Lab Results  Component Value Date   WBC 3.3 (L) 04/26/2019   HGB 10.4 (L) 04/26/2019   HCT 31.0 (L) 04/26/2019   MCV 102.6 (H) 04/26/2019   PLT 149 (L) 04/26/2019   Recent Labs    10/29/18 1318 10/30/18 0531 04/13/19 0943 04/19/19 0834 04/26/19 1305  NA 135   < > 135 139 133*  K 3.0*   < > 3.9 3.3* 3.1*  CL 104   < > 103 105 102  CO2 21*   < > 25 22 21*  GLUCOSE 110*   < > 129* 150* 141*  BUN 13   < > _0 CREATININE 0.32*   < > 0.68 0.79 0.60  CALCIUM 9.0   < > 8.3* 8.4* 7.9*  GFRNONAA >60    < > >60 >60 >60  GFRAA >60   < > >60 >60 >60  PROT 7.0   < > 6.7 6.4* 6.3*  ALBUMIN 2.6*   < > 2.9* 2.9* 2.8*  AST 119*   < > _1 ALT 79*   < > _2 ALKPHOS 399*   < > 366* 291* 266*  BILITOT 18.2*   < > 0.7 0.6 0.7  BILIDIR 10.2*  --   --   --   --    < > = values in this interval not displayed.   Iron/TIBC/Ferritin/ %Sat    Component Value Date/Time   IRON 117 04/13/2019 0951   TIBC 223 (L) 04/13/2019 0951   FERRITIN 1,108 (H) 04/13/2019 0951   IRONPCTSAT 53 (H) 04/13/2019 6333    RADIOGRAPHIC STUDIES: I have personally reviewed the radiological images as listed and agreed with the findings in the report.  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 Restaging. EXAM: CT CHEST, ABDOMEN, AND PELVIS WITH CONTRAST TECHNIQUE: Multidetector CT imaging of the chest, abdomen and pelvis was performed following the standard protocol during bolus administration of intravenous contrast. CONTRAST:  151m OMNIPAQUE IOHEXOL 300 MG/ML  SOLN COMPARISON:  02/15/2019 CT abdomen/pelvis.  11/09/2018 PET-CT. FINDINGS: CT CHEST FINDINGS Cardiovascular: Top-normal heart size. Right internal jugular Port-A-Cath terminates at the cavoatrial  junction. No significant pericardial effusion/thickening. Atherosclerotic nonaneurysmal thoracic aorta. Normal caliber pulmonary arteries. No central pulmonary emboli. Mediastinum/Nodes: Heterogeneous thyroid gland with subcentimeter bilateral thyroid nodules requiring no follow-up. Unremarkable esophagus. No axillary or hilar adenopathy. Mildly enlarged 1.0 cm lower right paraesophageal node (series 2/image 34), stable since 11/09/2018 PET-CT. Left retrocrural enlarged 1.4 cm node (series 2/image 46), increased from 0.9 cm on 02/15/2019 CT abdomen. No additional pathologically enlarged mediastinal nodes. Lungs/Pleura: No pneumothorax. No pleural effusion. Anterior  right middle lobe 3 mm solid pulmonary nodule (series 4/image 80) is stable since 11/09/2018 PET-CT. Mild platelike scarring versus atelectasis at both lung bases. No acute consolidative airspace disease, lung masses or new significant pulmonary nodules. Musculoskeletal: Several new sclerotic lesions throughout thoracic osseous structures including the sternum, bilateral ribs, left superior scapula, left clavicle and thoracic spine, new since 11/09/2018 PET-CT. Moderate thoracic spondylosis. CT ABDOMEN PELVIS FINDINGS Hepatobiliary: Normal liver size. Simple lateral segment left liver lobe 1.0 cm cyst is stable. Subcentimeter hypodense segment 4B left liver lobe lesion is new (series 2/image 57). Subcentimeter hypodense segment 6 right liver lesion (series 2/image 58) is stable. Subcentimeter segment 8 right liver dome hypodense lesion (series 2/image 39) is stable. No additional new liver lesions. Numerous calcified gallstones throughout the gallbladder with stable mild diffuse gallbladder wall thickening. Pneumobilia in the intrahepatic left liver lobe bile ducts with stable mild central intrahepatic biliary ductal dilatation. CBD stent well positioned with distal tip in the duodenal lumen just beyond the ampulla. Pneumobilia throughout the stent lumen. Pancreas: Stable mild fullness of the pancreatic head with an distinct surrounding soft tissue planes and no discrete measurable pancreatic mass by CT. Stable mild diffuse pancreatic duct dilation. No new pancreatic lesions. Spleen: Normal size. No mass. Adrenals/Urinary Tract: Normal adrenals. New moderate to severe right hydroureteronephrosis to the level of the lower right lumbar ureter without discrete obstructing stone or mass, although with mild urothelial wall thickening at the site of caliber transition of the right ureter (series 2/image 86). No left hydronephrosis. No renal masses. Bladder nondistended and obscured by streak artifact from left hip  hardware. Stomach/Bowel: Small hiatal hernia. Otherwise normal nondistended stomach. Normal caliber small bowel with no small bowel wall thickening. Normal appendix. Mild sigmoid diverticulosis, with no large bowel wall thickening or acute pericolonic fat stranding. Vascular/Lymphatic: Atherosclerotic nonaneurysmal abdominal aorta. Patent portal, splenic, hepatic and renal veins. Stable mildly enlarged 1.0 cm gastrohepatic ligament lymph node (series 2/image 48). Enlarged 1.4 cm porta hepatis node (series 2/image 51), previously 1.3 cm on 02/15/2019 CT, not appreciably changed. Stable enlarged 1.3 cm portacaval node (series 2/image 58). Enlarged 2.2 cm aortocaval node (series 2/image 53), previously 1.9 cm, mildly increased. Enlarged 1.7 cm left para-aortic node (series 2/image 60), previously 1.6 cm, not substantially changed. No pathologically enlarged pelvic lymph nodes. Reproductive: Grossly normal uterus.  No adnexal mass. Other: No pneumoperitoneum, ascites or focal fluid collection. Upper omental 1.3 cm soft tissue nodule (series 2/image 68), previously a 1.1 cm, slightly increased. Adjacent 0.7 cm upper omental soft tissue nodule (series 2/image 67), previously 0.5 cm, slightly increased. Small fat containing umbilical hernia. Musculoskeletal: Several sclerotic lesions scattered throughout the lumbar spine, upper sacrum, bilateral iliac bones and right pubic rami are unchanged since 02/15/2019 CT. No new osseous lesions. Left total hip arthroplasty. Mild lumbar spondylosis. IMPRESSION: 1. Findings suggest mild progression of metastatic disease. 2. Left retrocrural and aortocaval lymphadenopathy is mildly increased. Lower right paraesophageal, gastrohepatic ligament, porta hepatis, portacaval and left para-aortic lymphadenopathy is stable. 3. Upper  omental soft tissue nodules are slightly increased and likely represent peritoneal metastases. 4. Three scattered subcentimeter hypodense liver lesions, one of  which is new and two of which are stable, suspicious for liver metastases. 5. 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 Well-positioned CBD stent with pneumobilia indicating stent patency. 6. 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 No discrete stone or mass, although there is mild urothelial wall thickening at the site of caliber transition in the right ureter. Findings are indeterminate for benign treatment related or malignant right ureteral stricture. 7. 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 Sclerotic osseous metastases throughout the chest are new since most recent comparison chest imaging of 11/09/2018 PET-CT, compatible with response to therapy. 8. Aortic Atherosclerosis (ICD10-I70.0). Additional chronic findings as detailed. Electronically Signed   By: Ilona Sorrel M.D.   On: 03/31/2019 13:25   MR Lumbar Spine W Wo Contrast  Result Date: 03/10/2019 CLINICAL DATA:  Pancreatic cancer. Osseous metastases on CT. EXAM: MRI LUMBAR SPINE WITHOUT AND WITH CONTRAST TECHNIQUE: Multiplanar and multiecho pulse sequences of the lumbar spine were obtained without and with intravenous contrast. CONTRAST:  57m GADAVIST GADOBUTROL 1 MMOL/ML IV SOLN COMPARISON:  Abdominopelvic CT 02/15/2019. PET-CT 11/09/2018. FINDINGS: Segmentation: Conventional anatomy assumed, with the last open disc space designated L5-S1.Concordant with previous imaging. Alignment: Stable slight degenerative anterolisthesis at L3-4. Vertebrae: As seen on the recent CT, there is widespread osseous metastatic disease to the lumbar spine, sacrum and both iliac bones. Representative lesions on series 8 include a 2.2 cm lesion in the L2 vertebral body (image 12), a 2.4 cm lesion in the L4 vertebral body (image 26) and a 2.8 cm left  iliac lesion on image 35. Stable mild superior endplate compression deformity at L1 without residual marrow edema. No evidence of acute pathologic fracture. These osseous metastases enhance following contrast. Conus medullaris: Extends to the L1 level and appears normal. No abnormal intradural enhancement or significant epidural tumor. Paraspinal and other soft tissues: There are multiple enlarged retroperitoneal lymph nodes consistent with nodal metastases. These are grossly stable compared with the recent CT, measuring up to 1.6 cm short axis in the aortocaval space (image 10/8). Disc levels: No significant disc space findings at T12-L1 or L1-2. L2-3: Mild disc bulging eccentric to the left, facet and ligamentous hypertrophy. Borderline spinal stenosis with mild left foraminal narrowing. L3-4: Loss of disc height with annular disc bulging. There is moderate facet and ligamentous hypertrophy accounting for the grade 1 anterolisthesis. These factors contribute to severe spinal stenosis with moderate narrowing of both lateral recesses and mild left foraminal narrowing. L4-5: Relatively preserved disc height. Mild disc bulging with moderate facet and ligamentous hypertrophy. Mild spinal stenosis. No nerve root encroachment. L5-S1: Mild disc bulging with moderate asymmetric right-sided facet hypertrophy. No significant spinal stenosis or nerve root encroachment. IMPRESSION: 1. 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 No evidence of acute pathologic fracture. 2. Stable mild superior endplate compression deformity at L1. No residual marrow edema. 3. Stable retroperitoneal nodal metastases. 4. Severe multifactorial spinal stenosis at L3-4 with moderate narrowing of both lateral recesses and mild left foraminal narrowing. 5. Stable mild multifactorial spinal stenosis at L2-3 and L4-5. Electronically Signed   By:  WCaryl ComesD.  On: 03/10/2019 20:13   NM Bone Scan Whole Body  Result Date: 03/31/2019 CLINICAL DATA:  Stage IV pancreatic cancer EXAM: NUCLEAR MEDICINE WHOLE BODY BONE SCAN TECHNIQUE: Whole body anterior and posterior images were obtained approximately 3 hours after intravenous injection of radiopharmaceutical. RADIOPHARMACEUTICALS:  22.989 mCi Technetium-73mMDP IV COMPARISON:  CT chest abdomen pelvis 03/31/2019, PET-CT 11/09/2018 FINDINGS: Numerous sites of abnormal osseous tracer accumulation are identified consistent with widespread osseous metastatic disease. These include calvaria, BILATERAL ribs, sternum, thoracic/lumbar spine, and pelvis. In addition, numerous foci of abnormal uptake are seen within BILATERAL humeri, BILATERAL femora, and proximal forearms likely radius. Many of these are identified on accompanying CT as well. RIGHT hydronephrosis and hydroureter are identified with abnormal thickening at the mid RIGHT ureter series 2, image 86. LEFT hip prosthesis incidentally noted. IMPRESSION: Widespread osseous metastatic disease including long bone metastases as above. RIGHT hydronephrosis and proximal hydroureter. Electronically Signed   By: MLavonia DanaM.D.   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 Restaging. EXAM: CT CHEST, ABDOMEN, AND PELVIS WITH CONTRAST TECHNIQUE: Multidetector CT imaging of the chest, abdomen and pelvis was performed following the standard protocol during bolus administration of intravenous contrast. CONTRAST:  1073mOMNIPAQUE IOHEXOL 300 MG/ML  SOLN COMPARISON:  02/15/2019 CT abdomen/pelvis.  11/09/2018 PET-CT. FINDINGS: CT CHEST FINDINGS Cardiovascular: Top-normal heart size. Right internal jugular Port-A-Cath terminates at the cavoatrial junction. No significant pericardial effusion/thickening. Atherosclerotic nonaneurysmal  thoracic aorta. Normal caliber pulmonary arteries. No central pulmonary emboli. Mediastinum/Nodes: Heterogeneous thyroid gland with subcentimeter bilateral thyroid nodules requiring no follow-up. Unremarkable esophagus. No axillary or hilar adenopathy. Mildly enlarged 1.0 cm lower right paraesophageal node (series 2/image 34), stable since 11/09/2018 PET-CT. Left retrocrural enlarged 1.4 cm node (series 2/image 46), increased from 0.9 cm on 02/15/2019 CT abdomen. No additional pathologically enlarged mediastinal nodes. Lungs/Pleura: No pneumothorax. No pleural effusion. Anterior right middle lobe 3 mm solid pulmonary nodule (series 4/image 80) is stable since 11/09/2018 PET-CT. Mild platelike scarring versus atelectasis at both lung bases. No acute consolidative airspace disease, lung masses or new significant pulmonary nodules. Musculoskeletal: Several new sclerotic lesions throughout thoracic osseous structures including the sternum, bilateral ribs, left superior scapula, left clavicle and thoracic spine, new since 11/09/2018 PET-CT. Moderate thoracic spondylosis. CT ABDOMEN PELVIS FINDINGS Hepatobiliary: Normal liver size. Simple lateral segment left liver lobe 1.0 cm cyst is stable. Subcentimeter hypodense segment 4B left liver lobe lesion is new (series 2/image 57). Subcentimeter hypodense segment 6 right liver lesion (series 2/image 58) is stable. Subcentimeter segment 8 right liver dome hypodense lesion (series 2/image 39) is stable. No additional new liver lesions. Numerous calcified gallstones throughout the gallbladder with stable mild diffuse gallbladder wall thickening. Pneumobilia in the intrahepatic left liver lobe bile ducts with stable mild central intrahepatic biliary ductal dilatation. CBD stent well positioned with distal tip in the duodenal lumen just beyond the ampulla. Pneumobilia throughout the stent lumen. Pancreas: Stable mild fullness of the pancreatic head with an distinct surrounding soft  tissue planes and no discrete measurable pancreatic mass by CT. Stable mild diffuse pancreatic duct dilation. No new pancreatic lesions. Spleen: Normal size. No mass. Adrenals/Urinary Tract: Normal adrenals. New moderate to severe right hydroureteronephrosis to the level of the lower right lumbar ureter without discrete obstructing stone or mass, although with mild urothelial wall thickening at the site of caliber transition of the right ureter (series 2/image 86).  No left hydronephrosis. No renal masses. Bladder nondistended and obscured by streak artifact from left hip hardware. Stomach/Bowel: Small hiatal hernia. Otherwise normal nondistended stomach. Normal caliber small bowel with no small bowel wall thickening. Normal appendix. Mild sigmoid diverticulosis, with no large bowel wall thickening or acute pericolonic fat stranding. Vascular/Lymphatic: Atherosclerotic nonaneurysmal abdominal aorta. Patent portal, splenic, hepatic and renal veins. Stable mildly enlarged 1.0 cm gastrohepatic ligament lymph node (series 2/image 48). Enlarged 1.4 cm porta hepatis node (series 2/image 51), previously 1.3 cm on 02/15/2019 CT, not appreciably changed. Stable enlarged 1.3 cm portacaval node (series 2/image 58). Enlarged 2.2 cm aortocaval node (series 2/image 53), previously 1.9 cm, mildly increased. Enlarged 1.7 cm left para-aortic node (series 2/image 60), previously 1.6 cm, not substantially changed. No pathologically enlarged pelvic lymph nodes. Reproductive: Grossly normal uterus.  No adnexal mass. Other: No pneumoperitoneum, ascites or focal fluid collection. Upper omental 1.3 cm soft tissue nodule (series 2/image 68), previously a 1.1 cm, slightly increased. Adjacent 0.7 cm upper omental soft tissue nodule (series 2/image 67), previously 0.5 cm, slightly increased. Small fat containing umbilical hernia. Musculoskeletal: Several sclerotic lesions scattered throughout the lumbar spine, upper sacrum, bilateral iliac  bones and right pubic rami are unchanged since 02/15/2019 CT. No new osseous lesions. Left total hip arthroplasty. Mild lumbar spondylosis. IMPRESSION: 1. Findings suggest mild progression of metastatic disease. 2. Left retrocrural and aortocaval lymphadenopathy is mildly increased. Lower right paraesophageal, gastrohepatic ligament, porta hepatis, portacaval and left para-aortic lymphadenopathy is stable. 3. Upper omental soft tissue nodules are slightly increased and likely represent peritoneal metastases. 4. Three scattered subcentimeter hypodense liver lesions, one of which is new and two of which are stable, suspicious for liver metastases. 5. 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 Well-positioned CBD stent with pneumobilia indicating stent patency. 6. 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 No discrete stone or mass, although there is mild urothelial wall thickening at the site of caliber transition in the right ureter. Findings are indeterminate for benign treatment related or malignant right ureteral stricture. 7. 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 Sclerotic osseous metastases throughout the chest are new since most recent comparison chest imaging of 11/09/2018 PET-CT, compatible with response to therapy. 8. Aortic Atherosclerosis (ICD10-I70.0). Additional chronic findings as detailed. Electronically Signed   By: Ilona Sorrel M.D.   On: 03/31/2019 13:25   CT Abdomen Pelvis W Contrast  Result Date: 02/15/2019 CLINICAL DATA:  Pancreatic adenocarcinoma. EXAM: CT ABDOMEN AND PELVIS WITH CONTRAST TECHNIQUE: Multidetector CT imaging of the abdomen and pelvis was performed using the standard protocol following bolus administration of intravenous contrast. CONTRAST:  75m OMNIPAQUE  IOHEXOL 300 MG/ML  SOLN COMPARISON:  10/20/2018 FINDINGS: Lower chest: Unremarkable. Hepatobiliary: 8 mm hypodensity in the inferior right liver adjacent to the gallbladder (25/2) is new in the interval. 9 mm hypodensity in the tip of the left liver is stable and likely a cyst. Another 5 mm hypodensity in the dome of the liver is stable. Multiple gallstones evident with gas visible in the gallbladder lumen. Intrahepatic biliary duct dilatation seen previously has decreased with common bile duct stent visualized in situ. Pancreas: Pancreatic parenchymal atrophy noted with dilatation of the main duct in the body and tail of pancreas. No discrete pancreatic mass lesion evident although an ill-defined area of hypoenhancement in the head of the pancreas  measures approximately 3 x 2.6 cm today. Spleen: No splenomegaly. No focal mass lesion. Adrenals/Urinary Tract: No adrenal nodule or mass. Kidneys unremarkable. No evidence for hydroureter. The urinary bladder appears normal for the degree of distention. Stomach/Bowel: Tiny hiatal hernia. Stomach otherwise unremarkable. Duodenum is normally positioned as is the ligament of Treitz. No small bowel wall thickening. No small bowel dilatation. The terminal ileum is normal. The appendix is normal. No gross colonic mass. No colonic wall thickening. Diverticular changes are noted in the left colon without evidence of diverticulitis. Vascular/Lymphatic: There is abdominal aortic atherosclerosis without aneurysm. Portal vein, superior mesenteric vein, and splenic vein are patent. Celiac axis and SMA are patent. Mild lymphadenopathy in the gastrohepatic and hepato duodenal ligaments is not substantially changed. Index gastrohepatic ligament lymph node measures 9 mm short axis on 14/2. 15 mm retrocaval node on 21/2 was 18 mm short axis previously. Left para-aortic node measuring 1.4 cm short axis on 23/2 was 1.4 cm previously. No pelvic sidewall lymphadenopathy. Reproductive: The  uterus is unremarkable.  There is no adnexal mass. Other: Trace free fluid noted in the cul-de-sac. Musculoskeletal: Status post left total hip replacement. Interval development of multiple lytic and sclerotic bone lesions consistent with metastatic involvement. 2.8 cm lesion identified left iliac bone on 47/2. New 13 mm lytic and sclerotic lesion in the left L2 vertebral body noted on 24/2. 14 mm sclerotic lesion in the sacrum visible on 51/2. IMPRESSION: 1. Interval development of multiple lytic and sclerotic bone lesions consistent with bony metastatic involvement. 2. Decrease in intrahepatic biliary duct dilatation with common bile duct stent visualized in situ. 3. Ill-defined hypoenhancing pancreatic head mass measuring 3.0 x 2.6 cm today. 4. New 8 mm hypodensity in the inferior right liver adjacent to the gallbladder. Attention on follow-up recommended. 5. Upper abdominal lymphadenopathy shows no substantial change. 6.  Aortic Atherosclerois (ICD10-170.0) Electronically Signed   By: Misty Stanley M.D.   On: 02/15/2019 10:04   US Venous Img Lower Bilateral  Result Date: 04/05/2019 CLINICAL DATA:  Acute bilateral lower extremity swelling. EXAM: BILATERAL LOWER EXTREMITY VENOUS DOPPLER ULTRASOUND TECHNIQUE: Gray-scale sonography with graded compression, as well as color Doppler and duplex ultrasound were performed to evaluate the lower extremity deep venous systems from the level of the common femoral vein and including the common femoral, femoral, profunda femoral, popliteal and calf veins including the posterior tibial, peroneal and gastrocnemius veins when visible. The superficial great saphenous vein was also interrogated. Spectral Doppler was utilized to evaluate flow at rest and with distal augmentation maneuvers in the common femoral, femoral and popliteal veins. COMPARISON:  None. FINDINGS: RIGHT LOWER EXTREMITY Common Femoral Vein: No evidence of thrombus. Normal compressibility, respiratory  phasicity and response to augmentation. Saphenofemoral Junction: No evidence of thrombus. Normal compressibility and flow on color Doppler imaging. Profunda Femoral Vein: No evidence of thrombus. Normal compressibility and flow on color Doppler imaging. Femoral Vein: No evidence of thrombus. Normal compressibility, respiratory phasicity and response to augmentation. Popliteal Vein: No evidence of thrombus. Normal compressibility, respiratory phasicity and response to augmentation. Calf Veins: No evidence of thrombus. Normal compressibility and flow on color Doppler imaging. Venous Reflux:  None. Other Findings: Probable 3.7 cm Baker's cyst is noted in popliteal fossa. LEFT LOWER EXTREMITY Common Femoral Vein: No evidence of thrombus. Normal compressibility, respiratory phasicity and response to augmentation. Saphenofemoral Junction: No evidence of thrombus. Normal compressibility and flow on color Doppler imaging. Profunda Femoral Vein: No evidence of thrombus. Normal compressibility and flow on color Doppler  imaging. Femoral Vein: No evidence of thrombus. Normal compressibility, respiratory phasicity and response to augmentation. Popliteal Vein: No evidence of thrombus. Normal compressibility, respiratory phasicity and response to augmentation. Calf Veins: No evidence of thrombus. Normal compressibility and flow on color Doppler imaging. Venous Reflux:  None. Other Findings: Probable 1.3 cm Baker's cyst seen in left popliteal fossa. IMPRESSION: No evidence of deep venous thrombosis in either lower extremity. Electronically Signed   By: Marijo Conception M.D.   On: 04/05/2019 15:21   DG C-Arm 1-60 Min-No Report  Result Date: 01/28/2019 Fluoroscopy was utilized by the requesting physician.  No radiographic interpretation.    [facility] labs CA 19-9 was check on 10/21/2018, level was elevated at 1500.    ASSESSMENT & PLAN:  1. Malignant neoplasm of head of pancreas ([place])   2. Diarrhea, unspecified type     3. Neuropathy   4. Normocytic anemia   5. Mucositis   6. Thrush   7. Hypokalemia   8. Swelling of lower leg   . # Stage IV pancreatic cancer-7Labs are reviewed and discussed with patient CA19.9 1500--> 2101-->1614-->  897-->940--> 1972-->3374--> 6032-> 5085--> 4365 Status post 2 cycles of second line treatment with 5-FU and liposomal irinotecan. Patient tolerates with moderate difficulties.  Labs were reviewed and discussed with patient and husband.  #Diarrhea secondary to chemotherapy.  Patient has taken Imodium with no significant improvement this time. Discussed with patient and advised patient to try Lomotil 1 to 2 tablets 4 times daily as needed.  Prescription was sent to pharmacy.  Patient will receive IV fluid for hydration today.  #Thrush, advised patient to use nystatin swish and spit 3-4 times daily. #Mucositis, improved.  Magic mouth wash as needed.  Continue Valtrex 500 mg twice daily for lymphopenia prophylaxis.  #Hypokalemia, continue oral potassium chloride 10 mEq twice daily.  Patient will receive 20 mEq potassium chloride with 1 L of IV fluid today.Marland Kitchen  #Macrocytic anemia, normal vitamin B12 and folate level.  Likely secondary to chemotherapy. Hemoglobin is stable at 10.4.  Continue to monitor. #Neuropathy, continue gabapentin 300 mg twice daily.  #Bone metastasis, patient has been on Zometa monthly.  Last dose was 04/21/2019.  Next dose in early March 2021. #Bilateral lower extremity edema, no DVT on ultrasound.  Recommend patient to use compression stocking. All questions were answered. The patient knows to call the clinic with any problems questions or concerns.  Return of visit: Follow-up in in 3 days to see symptom management nurse practitioner. Follow-up with me in 1 week for evaluation prior to next chemo. We spent sufficient time to discuss many aspect of care, questions were answered to patient's satisfaction.  Earlie Server, MD, PhD Hematology Oncology [facility] 04/26/2019   #Addendum, patient came on 04/29/2019 and was seen by symptom management nurse practitioner Ander Purpura.  Patient was given IV fluid.  Per patient she continues to have 4-6 bouts of watery loose bowel movement every day despite taking Lomotil 1 p.o. every 4 hours.  Patient was advised to increase to Lomotil 2 tablets every 4 hours as needed. Given that patient has Imodium/Lomotil refractory diarrhea, I also called patient and asked her to return to the clinic to receive octreotide 150 MCG x1 today.  I have also sent prescription of octreotide 100 MCG 3 times daily prescription to pharmacy. Patient to follow-up in 3 days for reevaluation.  Patient understands that if her symptoms gets worse over the weekend, she needs to go  to emergency room for additional evaluation.  Earlie Server 04/29/2019

## 2019-04-26 NOTE — Progress Notes (Signed)
HPI:  Melissa [name] was previously seen in the [place] clinic due to a personal history of cancer and concerns regarding a hereditary predisposition to cancer. Please refer to our prior cancer genetics clinic note for more information regarding our discussion, assessment and recommendations, at the time. Melissa [name] recent genetic test results were disclosed to her, as were recommendations warranted by these results. These results and recommendations are discussed in more detail below.  CANCER HISTORY:  Oncology History Overview Note  Melissa [name] is a  79 y.o.  female with PMH listed below was seen in consultation at the request of  Adin Hector, MD  for evaluation of abnormal pancreatic disease. Patient was recently seen by primary care provider Dr. Caryl Comes for evaluation of nausea, diarrhea and jaundice.  Blood work on 10/19/2018 showed potassium 2.9, bilirubin 15, alkaline phosphatase 478, AST 114, ALT 95. Abdomen pelvis CT scan on 10/20/2018 showed moderate to market intra-and extra hepatic biliary dilation with mild diffuse dilatation of the pancreatic duct. Patient has had poor appetite and has lost a 5 to 10 pounds recently. She also had diarrhea.  She noticed sand like stool for 1- 2 weeks.  She takes care of her 53 year old mother and recently feels she is not able to take care of her anymore due to progressively worsening weakness and fatigue.  She placed her mother to assisted living yesterday. She has had discussion with Dr. Caryl Comes about her blood work and CT scans.  She understands that there is a strong suspicion of cancer.  Nonessential medication has been stopped. CA 19-9 was check on 10/21/2018, level was elevated at 1500.   # Patient was admitted from 10/29/2018-10/31/2018.  Status post ERCP and biliary stenting.  Patient had duodenum ampulla biopsy showed nondiagnostic.  Negative for invasive carcinoma. Bilirubin trended down to 11 at the time of discharge on  10/31/2018.   #PET scan images were independent reviewed by me and discussed with patient. cTxN2 M1a Patient has at least M1 a disease due to retroperitoneal/peri-aortic lymph node involvement,  sclerotic lesion of right proximal femur with mild hypermetabolic activity, as well as left sacrum   # EUS biopsy of pancreatic mass showed positive for malignancy, Adenocarcinoma. There is not enough tissue for NGS testing.  Attempt to at MMR, still not enough tissue to complete the testing   Malignant neoplasm of head of pancreas ([place])  11/02/2018 Initial Diagnosis   Malignant neoplasm of head of pancreas [facility])    Chemotherapy   The patient had PACLitaxel-protein bound (ABRAXANE) chemo infusion 200 mg, 115 mg/m2 = 225 mg, Intravenous,  Once, 1 of 5 cycles  Administration: 200 mg (11/30/2018)    gemcitabine (GEMZAR) 1,600 mg in sodium chloride 0.9 % 250 mL chemo infusion, 1,710 mg, Intravenous,  Once, 1 of 5 cycles  Administration: 1,600 mg (11/30/2018)  for chemotherapy treatment.       FAMILY HISTORY:  We obtained a detailed, 4-generation family history.  Significant diagnoses are listed below: Family History  Problem Relation Age of Onset   Kidney disease Father    Breast cancer Neg Hx     Melissa [name] had 2 daughters. One of her daughters had spina bifida and died at 72. Her other daughter is living at 32. Melissa [name] has 3 grandchildren. She also has 1 brother, living at 66 with no cancer history.  Melissa [name] mother died at 41, no cancer history. Patient had approximately 6 maternal uncles and 4 maternal aunts,  no cancers. She believes a maternal cousin may have had cancer but is unsure the type. Maternal grandparents did not have cancer that she is aware of.  Melissa [name] father died at 77, no history of cancer. Patient had approximately 6 paternal aunts and 4 paternal uncles, no cancers. No known cancers in paternal cousins. Paternal grandparents did not have cancer.  Ms.  [name] is unaware of previous family history of genetic testing for hereditary cancer risks. Patient's maternal ancestors are of English/unknown descent, and paternal ancestors are of English/unknown descent. There is no reported Ashkenazi Jewish ancestry. There is no known consanguinity.  GENETIC TEST RESULTS: Genetic testing reported out on 04/23/2019  through the [facility] Common Hereditary cancer panel found no pathogenic mutations.  The Common Hereditary Cancers Panel offered by [facility] includes sequencing and/or deletion duplication testing of the following 48 genes: APC, ATM, AXIN2, BARD1, BMPR1A, BRCA1, BRCA2, BRIP1, CDH1, CDKN2A (p14ARF), CDKN2A (p16INK4a), CKD4, CHEK2, CTNNA1, DICER1, EPCAM (Deletion/duplication testing only), GREM1 (promoter region deletion/duplication testing only), KIT, MEN1, MLH1, MSH2, MSH3, MSH6, MUTYH, NBN, NF1, NHTL1, PALB2, PDGFRA, PMS2, POLD1, POLE, PTEN, RAD50, RAD51C, RAD51D, RNF43, SDHB, SDHC, SDHD, SMAD4, SMARCA4. STK11, TP53, TSC1, TSC2, and VHL.  The following genes were evaluated for sequence changes only: SDHA and HOXB13 c.251G>A variant only.  The test report has been scanned into EPIC and is located under the Molecular Pathology section of the Results Review tab.  A portion of the result report is included below for reference.     We discussed with Melissa [name] that because current genetic testing is not perfect, it is possible there may be a gene mutation in one of these genes that current testing cannot detect, but that chance is small.  We also discussed, that there could be another gene that has not yet been discovered, or that we have not yet tested, that is responsible for the cancer diagnoses in the family. It is also possible there is a hereditary cause for the cancer in the family that Melissa [name] did not inherit and therefore was not identified in her testing.  Therefore, it is important to remain in touch with cancer genetics in the future so that we can  continue to offer Melissa [name] the most up to date genetic testing.   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  At this time, it is unknown if this variant is associated with increased cancer risk or if this is a normal finding, but most variants such as this get reclassified to being inconsequential. It should not be used to make medical management decisions. With time, we suspect the lab will determine the significance of this variant, if any. If we do learn more about it, we will try to contact Ms. Shingler to discuss it further. However, it is important to stay in touch with [place] periodically and keep the address and phone number up to date.  ADDITIONAL GENETIC TESTING: We discussed with Ms. Childers that her genetic testing was fairly extensive.  If there are genes identified to increase cancer risk that can be analyzed in the future, we would be happy to discuss and coordinate this testing at that time.    CANCER SCREENING RECOMMENDATIONS: Ms. Suares test result is considered negative (normal).  This means that we have not identified a hereditary cause for her  personal  history of cancer at this time. Most cancers happen by chance and this negative  test suggests that her cancer may fall into this category.    While reassuring, this does not definitively rule out a hereditary predisposition to cancer. It is still possible that there could be genetic mutations that are undetectable by current technology. There could be genetic mutations in genes that have not been tested or identified to increase cancer risk.  Therefore, it is recommended she continue to follow the cancer management and screening guidelines provided by her oncology and primary healthcare provider.   An individual's cancer risk and medical management are not determined by genetic test results alone. Overall cancer risk assessment incorporates additional  factors, including personal medical history, family history, and any available genetic information that may result in a personalized plan for cancer prevention and surveillance.  RECOMMENDATIONS FOR FAMILY MEMBERS:  Relatives in this family might be at some increased risk of developing cancer, over the general population risk, simply due to the family history of cancer.  We recommended female relatives in this family have a yearly mammogram beginning at age 29, or 31 years younger than the earliest onset of cancer, an annual clinical breast exam, and perform monthly breast self-exams. Female relatives in this family should also have a gynecological exam as recommended by their primary provider. All family members should have a colonoscopy by age 39, or as directed by their physicians.  FOLLOW-UP: Lastly, we discussed with Ms. Leise that cancer genetics is a rapidly advancing field and it is possible that new genetic tests will be appropriate for her and/or her family members in the future. We encouraged her to remain in contact with cancer genetics on an annual basis so we can update her personal and family histories and let her know of advances in cancer genetics that may benefit this family.   Our contact number was provided. Ms. Streetman questions were answered to her satisfaction, and she knows she is welcome to call us at anytime with additional questions or concerns.   Faith Rogue, MS, [facility] Genetic Counselor [email or web address] Phone: [phone]

## 2019-04-27 ENCOUNTER — Telehealth: Payer: Self-pay | Admitting: *Deleted

## 2019-04-27 ENCOUNTER — Telehealth: Payer: Self-pay | Admitting: Oncology

## 2019-04-27 NOTE — Telephone Encounter (Signed)
Patient's family member called triage RN this afternoon at 4:00 reporting diarrhea and wanted medication to be called in. Message was sent to me and I just saw this message at about 10:26 PM. I called patient at 10:30 PM and spoke to Melissa [name].  Lomotil was sent to pharmacy yesterday and the patient reports she gets the medication this afternoon.  She has tried Lomotil and the diarrhea has slowed down. Encourage patient to stay well-hydrated.  Call if symptoms are not getting better.  She appreciated call.

## 2019-04-27 NOTE — Telephone Encounter (Signed)
Patient daughter called and reports that patient has diarrhea again and that she needs something called in for it. Please advise

## 2019-04-27 NOTE — Telephone Encounter (Signed)
Has she tried lomotil? I sent her a Rx.  Please if urgent, please secure chat me. I just saw this message at 10:24pm.

## 2019-04-28 NOTE — Telephone Encounter (Signed)
When I called this morning and spoke with Melissa [name], she told she just picked up her lomotil last night, so she had not been taking it. I told her to try that and if no improvement to call us back

## 2019-04-29 ENCOUNTER — Inpatient Hospital Stay (HOSPITAL_BASED_OUTPATIENT_CLINIC_OR_DEPARTMENT_OTHER): Payer: Medicare Other | Admitting: Nurse Practitioner

## 2019-04-29 ENCOUNTER — Encounter: Payer: Self-pay | Admitting: Nurse Practitioner

## 2019-04-29 ENCOUNTER — Inpatient Hospital Stay: Payer: Medicare Other

## 2019-04-29 ENCOUNTER — Telehealth: Payer: Self-pay

## 2019-04-29 ENCOUNTER — Other Ambulatory Visit: Payer: Self-pay

## 2019-04-29 ENCOUNTER — Telehealth: Payer: Self-pay | Admitting: Pharmacy Technician

## 2019-04-29 VITALS — BP 125/84 | HR 99 | Temp 98.0°F | Resp 18

## 2019-04-29 DIAGNOSIS — E43 Unspecified severe protein-calorie malnutrition: Secondary | ICD-10-CM | POA: Insufficient documentation

## 2019-04-29 DIAGNOSIS — C250 Malignant neoplasm of head of pancreas: Secondary | ICD-10-CM

## 2019-04-29 DIAGNOSIS — R197 Diarrhea, unspecified: Secondary | ICD-10-CM

## 2019-04-29 DIAGNOSIS — Z95828 Presence of other vascular implants and grafts: Secondary | ICD-10-CM

## 2019-04-29 DIAGNOSIS — E876 Hypokalemia: Secondary | ICD-10-CM | POA: Diagnosis not present

## 2019-04-29 DIAGNOSIS — Z5111 Encounter for antineoplastic chemotherapy: Secondary | ICD-10-CM | POA: Diagnosis not present

## 2019-04-29 LAB — BASIC METABOLIC PANEL
Anion gap: 11 (ref 5–15)
BUN: 12 mg/dL (ref 8–23)
CO2: 21 mmol/L — ABNORMAL LOW (ref 22–32)
Calcium: 7.8 mg/dL — ABNORMAL LOW (ref 8.9–10.3)
Chloride: 102 mmol/L (ref 98–111)
Creatinine, Ser: 0.61 mg/dL (ref 0.44–1.00)
GFR calc Af Amer: >60 mL/min (ref >60)
GFR calc non Af Amer: >60 mL/min (ref >60)
Glucose, Bld: 141 mg/dL — ABNORMAL HIGH (ref 70–99)
Potassium: 2.8 mmol/L — ABNORMAL LOW (ref 3.5–5.1)
Sodium: 134 mmol/L — ABNORMAL LOW (ref 135–145)

## 2019-04-29 LAB — MAGNESIUM: Magnesium: 1.7 mg/dL (ref 1.7–2.4)

## 2019-04-29 LAB — C DIFFICILE QUICK SCREEN W PCR REFLEX
C Diff antigen: NEGATIVE
C Diff interpretation: NOT DETECTED
C Diff toxin: NEGATIVE

## 2019-04-29 LAB — HEMOCHROMATOSIS DNA-PCR(C282Y,H63D)

## 2019-04-29 MED ORDER — OCTREOTIDE ACETATE 100 MCG/ML IJ SOLN: 100.0000 ug | Freq: Three times a day (TID) | INTRAMUSCULAR | 0 refills | Status: DC

## 2019-04-29 MED ORDER — SODIUM CHLORIDE 0.9 % IV SOLN
Freq: Once | INTRAVENOUS | Status: AC
  Filled 2019-04-29: qty 1000

## 2019-04-29 MED ORDER — SODIUM CHLORIDE 0.9% FLUSH
10.0000 mL | Freq: Once | INTRAVENOUS | Status: AC
  Administered 2019-04-29: 10:00:00 10 mL via INTRAVENOUS
  Filled 2019-04-29: qty 10

## 2019-04-29 MED ORDER — POTASSIUM CHLORIDE CRYS ER 20 MEQ PO TBCR: 20.0000 meq | EXTENDED_RELEASE_TABLET | Freq: Every day | ORAL | 0 refills | Status: AC

## 2019-04-29 MED ORDER — OCTREOTIDE ACETATE 100 MCG/ML IJ SOLN
150.0000 ug | Freq: Once | INTRAMUSCULAR | Status: AC
  Administered 2019-04-29: 150 ug via SUBCUTANEOUS
  Filled 2019-04-29: qty 1.5

## 2019-04-29 NOTE — Addendum Note (Signed)
Addended by: Earlie Server on: 04/29/2019 04:02 PM   Modules accepted: Orders

## 2019-04-29 NOTE — Addendum Note (Signed)
Addended by: Earlie Server on: 04/29/2019 03:28 PM   Modules accepted: Orders

## 2019-04-29 NOTE — Telephone Encounter (Signed)
Pt received 150 mcg of octreotide this afternoon. Pt was advised to pick up potassium and octreotide (if approved by ins). Pt states she will go to pharmacy tomorrow morning. Education on the administration of octreotide given and indication to rotate sites with each injection. Paper with intructions given to patient. Pt voiced understanding.

## 2019-04-29 NOTE — Progress Notes (Signed)
Sympt[facility] [name]  [phone] [phone]  Patient Care Team: Adin Hector, MD as PCP - General (Internal Medicine) Clent Jacks, RN as Oncology Nurse Navigator   Name of the patient: Melissa [name]  [id number]  04-11-1940   Date of visit: 04/29/19  Diagnosis- Pancreatic Cancer  Chief complaint/ Reason for visit- Diarrhea  Heme/Onc history:  Oncology History Overview Note  Melissa [name] is a  79 y.o.  female with PMH listed below was seen in consultation at the request of  Adin Hector, MD  for evaluation of abnormal pancreatic disease. Patient was recently seen by primary care provider Dr. Caryl Comes for evaluation of nausea, diarrhea and jaundice.  Blood work on 10/19/2018 showed potassium 2.9, bilirubin 15, alkaline phosphatase 478, AST 114, ALT 95. Abdomen pelvis CT scan on 10/20/2018 showed moderate to market intra-and extra hepatic biliary dilation with mild diffuse dilatation of the pancreatic duct. Patient has had poor appetite and has lost a 5 to 10 pounds recently. She also had diarrhea.  She noticed sand like stool for 1- 2 weeks.  She takes care of her 54 year old mother and recently feels she is not able to take care of her anymore due to progressively worsening weakness and fatigue.  She placed her mother to assisted living yesterday. She has had discussion with Dr. Caryl Comes about her blood work and CT scans.  She understands that there is a strong suspicion of cancer.  Nonessential medication has been stopped. CA 19-9 was check on 10/21/2018, level was elevated at 1500.   # Patient was admitted from 10/29/2018-10/31/2018.  Status post ERCP and biliary stenting.  Patient had duodenum ampulla biopsy showed nondiagnostic.  Negative for invasive carcinoma. Bilirubin trended down to 11 at the time of discharge on 10/31/2018.   #PET scan images were independent reviewed by me and discussed with patient. cTxN2  M1a Patient has at least M1 a disease due to retroperitoneal/peri-aortic lymph node involvement,  sclerotic lesion of right proximal femur with mild hypermetabolic activity, as well as left sacrum   # EUS biopsy of pancreatic mass showed positive for malignancy, Adenocarcinoma. There is not enough tissue for NGS testing.  Attempt to at MMR, still not enough tissue to complete the testing   Malignant neoplasm of head of pancreas ([place])  11/02/2018 Initial Diagnosis   Malignant neoplasm of head of pancreas [facility])    Chemotherapy   The patient had PACLitaxel-protein bound (ABRAXANE) chemo infusion 200 mg, 115 mg/m2 = 225 mg, Intravenous,  Once, 1 of 5 cycles  Administration: 200 mg (11/30/2018)    gemcitabine (GEMZAR) 1,600 mg in sodium chloride 0.9 % 250 mL chemo infusion, 1,710 mg, Intravenous,  Once, 1 of 5 cycles  Administration: 1,600 mg (11/30/2018)  for chemotherapy treatment.       Interval history-Melissa [name], 79 year old female diagnosed with pancreatic cancer, currently receiving second line 5-FU and liposomal irinotecan, who presents to symptom management clinic for complaints of diarrhea.  She suffered diarrhea as part of her diagnosis but symptoms have recently resolved and have now recurred approximately 1 week ago.  She says diarrhea occurs approximately 4 times a day, described as watery and foul-smelling.  Estimates volume is large.  Diarrhea occurs at night.  Worse when she eats and drinks as this seems to onset symptoms.  Has not consumed solid food since 4 days ago and has been limiting solid food intake prior to that.  Associated weight loss.  Complains of fecal urgency.  Denies bleeding and bowel movements.  Has tried Imodium and Lomotil without significant effects.  Has secondary hypokalemia and has been on oral potassium.  She says that she can see whole potassium tablets in her stool undissolved.  No previous stool studies or infectious work-up.  No prior history of  malabsorption or inflammatory bowel.  Says she is generally weak and continues to lose weight.  Denies nausea or vomiting.  Denies abdominal pain.  Intermittent abdominal discomfort and bloating.  ECOG FS:2 - Symptomatic, <50% confined to bed  Review of systems- Review of Systems  Constitutional: Positive for malaise/fatigue and weight loss. Negative for chills and fever.  HENT: Negative for hearing loss, nosebleeds, sore throat and tinnitus.   Eyes: Negative for blurred vision and double vision.  Respiratory: Negative for cough, hemoptysis, shortness of breath and wheezing.   Cardiovascular: Negative for chest pain, palpitations and leg swelling.  Gastrointestinal: Positive for diarrhea. Negative for abdominal pain, blood in stool, constipation, melena, nausea and vomiting.       Bloating  Genitourinary: Negative for dysuria and urgency.  Musculoskeletal: Negative for back pain, falls, joint pain and myalgias.  Skin: Negative for itching and rash.  Neurological: Positive for weakness. Negative for dizziness, tingling, sensory change, loss of consciousness and headaches.  Endo/Heme/Allergies: Negative for environmental allergies. Does not bruise/bleed easily.  Psychiatric/Behavioral: Negative for depression. The patient is not nervous/anxious and does not have insomnia.      Current treatment-second line 5-FU and liposomal irinotecan  No Known Allergies  Past Medical History:  Diagnosis Date  . Glaucoma   . Hypertension   . Osteopenia   . Stroke [facility])     Past Surgical History:  Procedure Laterality Date  . BREAST BIOPSY Right    core- stereo- neg  . ERCP N/A 10/29/2018   Procedure: ENDOSCOPIC RETROGRADE CHOLANGIOPANCREATOGRAPHY (ERCP);  Surgeon: [name], MD;  Location: [facility] ENDOSCOPY;  Service: Endoscopy;  Laterality: N/A;  . ERCP N/A 01/28/2019   Procedure: ENDOSCOPIC RETROGRADE CHOLANGIOPANCREATOGRAPHY (ERCP) STENT REMOVAL;  Surgeon: [name], MD;  Location: [facility]  ENDOSCOPY;  Service: Endoscopy;  Laterality: N/A;  . EUS N/A 11/18/2018   Procedure: FULL UPPER ENDOSCOPIC ULTRASOUND (EUS) RADIAL;  Surgeon: Holly Bodily, MD;  Location[facility] ENDOSCOPY;  Service: Gastroenterology;  Laterality: N/A;  . HIP ARTHROPLASTY Left 01/02/2016   Procedure: ARTHROPLASTY BIPOLAR HIP (HEMIARTHROPLASTY);  Surgeon: Dereck Leep, MD;  Location: [facility] ORS;  Service: Orthopedics;  Laterality: Left;  . JOINT REPLACEMENT Left    THR  . PORTACATH PLACEMENT Right 11/26/2018   Procedure: INSERTION PORT-A-CATH;  Surgeon: Jules Husbands, MD;  Location: [facility] ORS;  Service: General;  Laterality: Right;  . TONSILLECTOMY      Social History   Socioeconomic History  . Marital status: Married    Spouse name: Not on file  . Number of children: Not on file  . Years of education: Not on file  . Highest education level: Not on file  Occupational History  . Not on file  Tobacco Use  . Smoking status: Never Smoker  . Smokeless tobacco: Never Used  Substance and Sexual Activity  . Alcohol use: No  . Drug use: Never  . Sexual activity: Not on file  Other Topics Concern  . Not on file  Social History Narrative  . Not on file   Social Determinants of Health   Financial Resource Strain:   . Difficulty of Paying Living Expenses: Not on file  Food  Insecurity:   . Worried About Charity fundraiser in the Last Year: Not on file  . Ran Out of Food in the Last Year: Not on file  Transportation Needs:   . Lack of Transportation (Medical): Not on file  . Lack of Transportation (Non-Medical): Not on file  Physical Activity:   . Days of Exercise per Week: Not on file  . Minutes of Exercise per Session: Not on file  Stress:   . Feeling of Stress : Not on file  Social Connections:   . Frequency of Communication with Friends and Family: Not on file  . Frequency of Social Gatherings with Friends and Family: Not on file  . Attends Religious Services: Not on file  . Active Member  of Clubs or Organizations: Not on file  . Attends Archivist Meetings: Not on file  . Marital Status: Not on file  Intimate Partner Violence:   . Fear of Current or Ex-Partner: Not on file  . Emotionally Abused: Not on file  . Physically Abused: Not on file  . Sexually Abused: Not on file    Family History  Problem Relation Age of Onset  . Kidney disease Father   . Breast cancer Neg Hx      Current Outpatient Medications:  .  acetaminophen (TYLENOL) 500 MG tablet, Take 500 mg by mouth every 6 (six) hours as needed., Disp: , Rfl:  .  amLODipine (NORVASC) 10 MG tablet, Take 10 mg by mouth at bedtime. , Disp: , Rfl:  .  dexamethasone (DECADRON) 4 MG tablet, Take 2 tablets (8 mg total) by mouth daily. Start the day after irinotecan chemotherapy for 2 days., Disp: 8 tablet, Rfl: 5 .  diphenoxylate-atropine (LOMOTIL) 2.5-0.025 MG tablet, Take 1-2 tablets by mouth 4 (four) times daily as needed for diarrhea or loose stools., Disp: 60 tablet, Rfl: 0 .  gabapentin (NEURONTIN) 300 MG capsule, Take 1 capsule (300 mg total) by mouth 2 (two) times daily., Disp: 60 capsule, Rfl: 0 .  magic mouthwash w/lidocaine SOLN, Take 5 mLs by mouth 4 (four) times daily as needed for mouth pain. Sig: Swish/Swallow 5-10 ml four times a day as needed. Dispense 480 ml. 1RF, Disp: 480 mL, Rfl: 1 .  nystatin (MYCOSTATIN) 100000 UNIT/ML suspension, Take 5 mLs (500,000 Units total) by mouth 4 (four) times daily., Disp: 473 mL, Rfl: 0 .  ondansetron (ZOFRAN) 8 MG tablet, Take 1 tablet by mouth as needed., Disp: , Rfl:  .  oxyCODONE (ROXICODONE) 5 MG immediate release tablet, Take 1 tablet (5 mg total) by mouth every 6 (six) hours as needed for moderate pain or severe pain., Disp: 60 tablet, Rfl: 0 .  docusate sodium (COLACE) 100 MG capsule, Take 1 capsule (100 mg total) by mouth daily as needed for mild constipation or moderate constipation. Do not take if you have loose stools (Patient not taking: Reported on  04/26/2019), Disp: 60 capsule, Rfl: 2 .  lidocaine-prilocaine (EMLA) cream, Apply to affected area once, Disp: 30 g, Rfl: 3 .  loperamide (IMODIUM A-D) 2 MG tablet, Take 1 tablet (2 mg total) by mouth See admin instructions. Take 2 at diarrhea onset , then 1 every 2hr until 12hrs with no BM. May take 2 every 4hrs at night. If diarrhea recurs repeat. (Patient not taking: Reported on 04/29/2019), Disp: 100 tablet, Rfl: 1 .  potassium chloride (K-DUR) 10 MEQ tablet, Take 1 tablet (10 mEq total) by mouth daily. (Patient taking differently: Take 10 mEq by  mouth 2 (two) times daily. ), Disp: 30 tablet, Rfl: 0 .  prochlorperazine (COMPAZINE) 10 MG tablet, Take 1 tablet (10 mg total) by mouth every 6 (six) hours as needed (NAUSEA). (Patient not taking: Reported on 04/29/2019), Disp: 30 tablet, Rfl: 1 .  valACYclovir (VALTREX) 500 MG tablet, Take 1 tablet (500 mg total) by mouth 2 (two) times daily., Disp: 60 tablet, Rfl: 0  Physical exam:  Vitals:   04/29/19 0953 04/29/19 0954  BP: 125/84   Pulse: 99 99  Resp: 18 18  Temp:  98 F (36.7 C)  TempSrc: Tympanic   SpO2:  100%   Physical Exam Constitutional:      General: She is not in acute distress.    Comments: Thin built.  Appears stated age.  In recliner in exam room.  Fatigued appearing.  Wearing mask.  Eyes:     General: No scleral icterus.    Conjunctiva/sclera: Conjunctivae normal.  Pulmonary:     Effort: No respiratory distress.  Abdominal:     General: There is no distension.     Palpations: Abdomen is soft.     Tenderness: There is no abdominal tenderness. There is no guarding.  Musculoskeletal:        General: Normal range of motion.  Skin:    General: Skin is warm and dry.  Neurological:     Mental Status: She is alert and oriented to person, place, and time.  Psychiatric:        Mood and Affect: Mood normal.        Behavior: Behavior normal.      CMP Latest Ref Rng & Units 04/29/2019  Glucose 70 - 99 mg/dL 141(H)  BUN 8 - 23  mg/dL 12  Creatinine 0.44 - 1.00 mg/dL 0.61  Sodium 135 - 145 mmol/L 134(L)  Potassium 3.5 - 5.1 mmol/L 2.8(L)  Chloride 98 - 111 mmol/L 102  CO2 22 - 32 mmol/L 21(L)  Calcium 8.9 - 10.3 mg/dL 7.8(L)  Total Protein 6.5 - 8.1 g/dL -  Total Bilirubin 0.3 - 1.2 mg/dL -  Alkaline Phos 38 - 126 U/L -  AST 15 - 41 U/L -  ALT 0 - 44 U/L -   CBC Latest Ref Rng & Units 04/26/2019  WBC 4.0 - 10.5 K/uL 3.3(L)  Hemoglobin 12.0 - 15.0 g/dL 10.4(L)  Hematocrit 36.0 - 46.0 % 31.0(L)  Platelets 150 - 400 K/uL 149(L)    No images are attached to the encounter.  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 Restaging. EXAM: CT CHEST, ABDOMEN, AND PELVIS WITH CONTRAST TECHNIQUE: Multidetector CT imaging of the chest, abdomen and pelvis was performed following the standard protocol during bolus administration of intravenous contrast. CONTRAST:  143m OMNIPAQUE IOHEXOL 300 MG/ML  SOLN COMPARISON:  02/15/2019 CT abdomen/pelvis.  11/09/2018 PET-CT. FINDINGS: CT CHEST FINDINGS Cardiovascular: Top-normal heart size. Right internal jugular Port-A-Cath terminates at the cavoatrial junction. No significant pericardial effusion/thickening. Atherosclerotic nonaneurysmal thoracic aorta. Normal caliber pulmonary arteries. No central pulmonary emboli. Mediastinum/Nodes: Heterogeneous thyroid gland with subcentimeter bilateral thyroid nodules requiring no follow-up. Unremarkable esophagus. No axillary or hilar adenopathy. Mildly enlarged 1.0 cm lower right paraesophageal node (series 2/image 34), stable since 11/09/2018 PET-CT. Left retrocrural enlarged 1.4 cm node (series 2/image 46), increased from 0.9 cm on 02/15/2019 CT abdomen. No additional pathologically enlarged mediastinal nodes. Lungs/Pleura: No pneumothorax. No pleural effusion. Anterior right middle lobe 3 mm solid pulmonary nodule (  series 4/image 80) is stable since  11/09/2018 PET-CT. Mild platelike scarring versus atelectasis at both lung bases. No acute consolidative airspace disease, lung masses or new significant pulmonary nodules. Musculoskeletal: Several new sclerotic lesions throughout thoracic osseous structures including the sternum, bilateral ribs, left superior scapula, left clavicle and thoracic spine, new since 11/09/2018 PET-CT. Moderate thoracic spondylosis. CT ABDOMEN PELVIS FINDINGS Hepatobiliary: Normal liver size. Simple lateral segment left liver lobe 1.0 cm cyst is stable. Subcentimeter hypodense segment 4B left liver lobe lesion is new (series 2/image 57). Subcentimeter hypodense segment 6 right liver lesion (series 2/image 58) is stable. Subcentimeter segment 8 right liver dome hypodense lesion (series 2/image 39) is stable. No additional new liver lesions. Numerous calcified gallstones throughout the gallbladder with stable mild diffuse gallbladder wall thickening. Pneumobilia in the intrahepatic left liver lobe bile ducts with stable mild central intrahepatic biliary ductal dilatation. CBD stent well positioned with distal tip in the duodenal lumen just beyond the ampulla. Pneumobilia throughout the stent lumen. Pancreas: Stable mild fullness of the pancreatic head with an distinct surrounding soft tissue planes and no discrete measurable pancreatic mass by CT. Stable mild diffuse pancreatic duct dilation. No new pancreatic lesions. Spleen: Normal size. No mass. Adrenals/Urinary Tract: Normal adrenals. New moderate to severe right hydroureteronephrosis to the level of the lower right lumbar ureter without discrete obstructing stone or mass, although with mild urothelial wall thickening at the site of caliber transition of the right ureter (series 2/image 86). No left hydronephrosis. No renal masses. Bladder nondistended and obscured by streak artifact from left hip hardware. Stomach/Bowel: Small hiatal hernia. Otherwise normal nondistended stomach.  Normal caliber small bowel with no small bowel wall thickening. Normal appendix. Mild sigmoid diverticulosis, with no large bowel wall thickening or acute pericolonic fat stranding. Vascular/Lymphatic: Atherosclerotic nonaneurysmal abdominal aorta. Patent portal, splenic, hepatic and renal veins. Stable mildly enlarged 1.0 cm gastrohepatic ligament lymph node (series 2/image 48). Enlarged 1.4 cm porta hepatis node (series 2/image 51), previously 1.3 cm on 02/15/2019 CT, not appreciably changed. Stable enlarged 1.3 cm portacaval node (series 2/image 58). Enlarged 2.2 cm aortocaval node (series 2/image 53), previously 1.9 cm, mildly increased. Enlarged 1.7 cm left para-aortic node (series 2/image 60), previously 1.6 cm, not substantially changed. No pathologically enlarged pelvic lymph nodes. Reproductive: Grossly normal uterus.  No adnexal mass. Other: No pneumoperitoneum, ascites or focal fluid collection. Upper omental 1.3 cm soft tissue nodule (series 2/image 68), previously a 1.1 cm, slightly increased. Adjacent 0.7 cm upper omental soft tissue nodule (series 2/image 67), previously 0.5 cm, slightly increased. Small fat containing umbilical hernia. Musculoskeletal: Several sclerotic lesions scattered throughout the lumbar spine, upper sacrum, bilateral iliac bones and right pubic rami are unchanged since 02/15/2019 CT. No new osseous lesions. Left total hip arthroplasty. Mild lumbar spondylosis. IMPRESSION: 1. Findings suggest mild progression of metastatic disease. 2. Left retrocrural and aortocaval lymphadenopathy is mildly increased. Lower right paraesophageal, gastrohepatic ligament, porta hepatis, portacaval and left para-aortic lymphadenopathy is stable. 3. Upper omental soft tissue nodules are slightly increased and likely represent peritoneal metastases. 4. Three scattered subcentimeter hypodense liver lesions, one of which is new and two of which are stable, suspicious for liver metastases. 5. Stable  fullness and heterogeneity of the pancreatic head with indistinct soft tissue planes compatible with known primary pancreatic head neoplasm. Well-positioned CBD stent with pneumobilia indicating stent patency. 6. 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 No discrete stone or mass, although there is mild  urothelial wall thickening at the site of caliber transition in the right ureter. Findings are indeterminate for benign treatment related or malignant right ureteral stricture. 7. 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 Sclerotic osseous metastases throughout the chest are new since most recent comparison chest imaging of 11/09/2018 PET-CT, compatible with response to therapy. 8. Aortic Atherosclerosis (ICD10-I70.0). Additional chronic findings as detailed. Electronically Signed   By: Ilona Sorrel M.D.   On: 03/31/2019 13:25   NM Bone Scan Whole Body  Result Date: 03/31/2019 CLINICAL DATA:  Stage IV pancreatic cancer EXAM: NUCLEAR MEDICINE WHOLE BODY BONE SCAN TECHNIQUE: Whole body anterior and posterior images were obtained approximately 3 hours after intravenous injection of radiopharmaceutical. RADIOPHARMACEUTICALS:  22.989 mCi Technetium-40mMDP IV COMPARISON:  CT chest abdomen pelvis 03/31/2019, PET-CT 11/09/2018 FINDINGS: Numerous sites of abnormal osseous tracer accumulation are identified consistent with widespread osseous metastatic disease. These include calvaria, BILATERAL ribs, sternum, thoracic/lumbar spine, and pelvis. In addition, numerous foci of abnormal uptake are seen within BILATERAL humeri, BILATERAL femora, and proximal forearms likely radius. Many of these are identified on accompanying CT as well. RIGHT hydronephrosis and hydroureter are identified with abnormal thickening at the mid RIGHT ureter series 2, image 86. LEFT hip prosthesis incidentally noted. 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 RIGHT hydronephrosis and proximal hydroureter. Electronically Signed   By: MLavonia DanaM.D.   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 Restaging. EXAM: CT CHEST, ABDOMEN, AND PELVIS WITH CONTRAST TECHNIQUE: Multidetector CT imaging of the chest, abdomen and pelvis was performed following the standard protocol during bolus administration of intravenous contrast. CONTRAST:  1050mOMNIPAQUE IOHEXOL 300 MG/ML  SOLN COMPARISON:  02/15/2019 CT abdomen/pelvis.  11/09/2018 PET-CT. FINDINGS: CT CHEST FINDINGS Cardiovascular: Top-normal heart size. Right internal jugular Port-A-Cath terminates at the cavoatrial junction. No significant pericardial effusion/thickening. Atherosclerotic nonaneurysmal thoracic aorta. Normal caliber pulmonary arteries. No central pulmonary emboli. Mediastinum/Nodes: Heterogeneous thyroid gland with subcentimeter bilateral thyroid nodules requiring no follow-up. Unremarkable esophagus. No axillary or hilar adenopathy. Mildly enlarged 1.0 cm lower right paraesophageal node (series 2/image 34), stable since 11/09/2018 PET-CT. Left retrocrural enlarged 1.4 cm node (series 2/image 46), increased from 0.9 cm on 02/15/2019 CT abdomen. No additional pathologically enlarged mediastinal nodes. Lungs/Pleura: No pneumothorax. No pleural effusion. Anterior right middle lobe 3 mm solid pulmonary nodule (series 4/image 80) is stable since 11/09/2018 PET-CT. Mild platelike scarring versus atelectasis at both lung bases. No acute consolidative airspace disease, lung masses or new significant pulmonary nodules. Musculoskeletal: Several new sclerotic lesions throughout thoracic osseous structures including the sternum, bilateral ribs, left superior scapula, left clavicle and thoracic spine, new since 11/09/2018  PET-CT. Moderate thoracic spondylosis. CT ABDOMEN PELVIS FINDINGS Hepatobiliary: Normal liver size. Simple lateral segment left liver lobe 1.0 cm cyst is stable. Subcentimeter hypodense segment 4B left liver lobe lesion is new (series 2/image 57). Subcentimeter hypodense segment 6 right liver lesion (series 2/image 58) is stable. Subcentimeter segment 8 right liver dome hypodense lesion (series 2/image 39) is stable. No additional new liver lesions. Numerous calcified gallstones throughout the gallbladder with stable mild diffuse gallbladder wall thickening. Pneumobilia in the intrahepatic left liver lobe bile ducts with stable mild central intrahepatic biliary ductal dilatation. CBD stent well positioned with distal tip in the duodenal lumen just beyond the ampulla. Pneumobilia throughout the stent lumen. Pancreas:  Stable mild fullness of the pancreatic head with an distinct surrounding soft tissue planes and no discrete measurable pancreatic mass by CT. Stable mild diffuse pancreatic duct dilation. No new pancreatic lesions. Spleen: Normal size. No mass. Adrenals/Urinary Tract: Normal adrenals. New moderate to severe right hydroureteronephrosis to the level of the lower right lumbar ureter without discrete obstructing stone or mass, although with mild urothelial wall thickening at the site of caliber transition of the right ureter (series 2/image 86). No left hydronephrosis. No renal masses. Bladder nondistended and obscured by streak artifact from left hip hardware. Stomach/Bowel: Small hiatal hernia. Otherwise normal nondistended stomach. Normal caliber small bowel with no small bowel wall thickening. Normal appendix. Mild sigmoid diverticulosis, with no large bowel wall thickening or acute pericolonic fat stranding. Vascular/Lymphatic: Atherosclerotic nonaneurysmal abdominal aorta. Patent portal, splenic, hepatic and renal veins. Stable mildly enlarged 1.0 cm gastrohepatic ligament lymph node (series 2/image  48). Enlarged 1.4 cm porta hepatis node (series 2/image 51), previously 1.3 cm on 02/15/2019 CT, not appreciably changed. Stable enlarged 1.3 cm portacaval node (series 2/image 58). Enlarged 2.2 cm aortocaval node (series 2/image 53), previously 1.9 cm, mildly increased. Enlarged 1.7 cm left para-aortic node (series 2/image 60), previously 1.6 cm, not substantially changed. No pathologically enlarged pelvic lymph nodes. Reproductive: Grossly normal uterus.  No adnexal mass. Other: No pneumoperitoneum, ascites or focal fluid collection. Upper omental 1.3 cm soft tissue nodule (series 2/image 68), previously a 1.1 cm, slightly increased. Adjacent 0.7 cm upper omental soft tissue nodule (series 2/image 67), previously 0.5 cm, slightly increased. Small fat containing umbilical hernia. Musculoskeletal: Several sclerotic lesions scattered throughout the lumbar spine, upper sacrum, bilateral iliac bones and right pubic rami are unchanged since 02/15/2019 CT. No new osseous lesions. Left total hip arthroplasty. Mild lumbar spondylosis. IMPRESSION: 1. Findings suggest mild progression of metastatic disease. 2. Left retrocrural and aortocaval lymphadenopathy is mildly increased. Lower right paraesophageal, gastrohepatic ligament, porta hepatis, portacaval and left para-aortic lymphadenopathy is stable. 3. Upper omental soft tissue nodules are slightly increased and likely represent peritoneal metastases. 4. Three scattered subcentimeter hypodense liver lesions, one of which is new and two of which are stable, suspicious for liver metastases. 5. 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 Well-positioned CBD stent with pneumobilia indicating stent patency. 6. 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 No discrete stone or mass, although there is mild urothelial wall  thickening at the site of caliber transition in the right ureter. Findings are indeterminate for benign treatment related or malignant right ureteral stricture. 7. 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 Sclerotic osseous metastases throughout the chest are new since most recent comparison chest imaging of 11/09/2018 PET-CT, compatible with response to therapy. 8. Aortic Atherosclerosis (ICD10-I70.0). Additional chronic findings as detailed. Electronically Signed   By: Ilona Sorrel M.D.   On: 03/31/2019 13:25   US Venous Img Lower Bilateral  Result Date: 04/05/2019 CLINICAL DATA:  Acute bilateral lower extremity swelling. EXAM: BILATERAL LOWER EXTREMITY VENOUS DOPPLER ULTRASOUND TECHNIQUE: Gray-scale sonography with graded compression, as well as color Doppler and duplex ultrasound were performed to evaluate the lower extremity deep venous systems from the level of the common femoral vein and including the common femoral, femoral, profunda femoral, popliteal and calf veins including the posterior tibial, peroneal and gastrocnemius veins when visible. The superficial great saphenous vein was also interrogated.  Spectral Doppler was utilized to evaluate flow at rest and with distal augmentation maneuvers in the common femoral, femoral and popliteal veins. COMPARISON:  None. FINDINGS: RIGHT LOWER EXTREMITY Common Femoral Vein: No evidence of thrombus. Normal compressibility, respiratory phasicity and response to augmentation. Saphenofemoral Junction: No evidence of thrombus. Normal compressibility and flow on color Doppler imaging. Profunda Femoral Vein: No evidence of thrombus. Normal compressibility and flow on color Doppler imaging. Femoral Vein: No evidence of thrombus. Normal compressibility, respiratory phasicity and response to augmentation. Popliteal Vein: No evidence of thrombus. Normal compressibility, respiratory phasicity and response to  augmentation. Calf Veins: No evidence of thrombus. Normal compressibility and flow on color Doppler imaging. Venous Reflux:  None. Other Findings: Probable 3.7 cm Baker's cyst is noted in popliteal fossa. LEFT LOWER EXTREMITY Common Femoral Vein: No evidence of thrombus. Normal compressibility, respiratory phasicity and response to augmentation. Saphenofemoral Junction: No evidence of thrombus. Normal compressibility and flow on color Doppler imaging. Profunda Femoral Vein: No evidence of thrombus. Normal compressibility and flow on color Doppler imaging. Femoral Vein: No evidence of thrombus. Normal compressibility, respiratory phasicity and response to augmentation. Popliteal Vein: No evidence of thrombus. Normal compressibility, respiratory phasicity and response to augmentation. Calf Veins: No evidence of thrombus. Normal compressibility and flow on color Doppler imaging. Venous Reflux:  None. Other Findings: Probable 1.3 cm Baker's cyst seen in left popliteal fossa. IMPRESSION: No evidence of deep venous thrombosis in either lower extremity. Electronically Signed   By: Marijo Conception M.D.   On: 04/05/2019 15:21    Assessment and plan- Patient is a 79 y.o. female diagnosed with stage IV pancreatic cancer, currently on second line 5-FU and liposomal irinotecan, who presents to symptom management clinic for diarrhea.  Diarrhea-acute on chronic.  Differentials: Question pancreatic malabsorption in setting of known pancreatic cancer vs infectious vs chemo induced vs others.  Will check fecal elastase and C. difficile by PCR today.  If diarrhea secondary to malabsorption, recommend exocrine enzyme replacement.  If infectious etiology, treat accordingly.  Can continue Imodium and Lomotil if negative. Would increase/maximize lomotil to 2 tablets (5 mg every 6 hours) and consider octreotide as likely chemo induced. Colace removed from med-list. Advised patient not to take which she says she hasn't been.     Malnutrition-continued weight loss and low serum albumin at 2.8.  Weight at baseline around 150 pounds.  Again today discussed possible trial of mirtazapine.  Will consider once diarrhea is better controlled.  She is followed by dietitian.  Continue to monitor.  Hypokalemia- K 2.8 today. Worse. secondary to GI losses and poor intake.  Likely poor absorption through GI tract based on history.  Will give IV potassium supplementation 40 mEq today in clinic along with 1 L IV fluids and continue oral supplementation.  Hypocalcemia-corrected calcium 8.76 which is slightly low.  Will recommend supplementation.  Continue to monitor.  Findings and plan of care discussed with Dr Tasia Catchings.   Disposition: Return to clinic on 05/02/2019 for labs, reevaluation, and possible fluids and electrolytes.   Visit Diagnosis 1. Diarrhea, unspecified type   2. Severe protein-calorie malnutrition ([place])   3. Hypokalemia     Patient expressed understanding and was in agreement with this plan. She also understands that She can call clinic at any time with any questions, concerns, or complaints.   Thank you for allowing me to participate in the care of this very pleasant patient.   Beckey Rutter, DNP, AGNP-C [facility] [place]

## 2019-05-01 ENCOUNTER — Other Ambulatory Visit: Payer: Self-pay | Admitting: Oncology

## 2019-05-01 DIAGNOSIS — C250 Malignant neoplasm of head of pancreas: Secondary | ICD-10-CM

## 2019-05-02 ENCOUNTER — Inpatient Hospital Stay
Admission: EM | Admit: 2019-05-02 | Discharge: 2019-05-08 | DRG: 393 | Disposition: A | Discharge status: Home Health | Payer: Medicare Other | Attending: Hospitalist | Admitting: Hospitalist

## 2019-05-02 ENCOUNTER — Inpatient Hospital Stay: Payer: Medicare Other

## 2019-05-02 ENCOUNTER — Other Ambulatory Visit: Payer: Self-pay

## 2019-05-02 ENCOUNTER — Inpatient Hospital Stay (HOSPITAL_BASED_OUTPATIENT_CLINIC_OR_DEPARTMENT_OTHER): Payer: Medicare Other | Admitting: Oncology

## 2019-05-02 ENCOUNTER — Encounter: Payer: Self-pay | Admitting: Oncology

## 2019-05-02 VITALS — BP 140/75 | HR 96 | Temp 96.7°F | Resp 12 | Wt 124.0 lb

## 2019-05-02 DIAGNOSIS — I10 Essential (primary) hypertension: Secondary | ICD-10-CM | POA: Diagnosis present

## 2019-05-02 DIAGNOSIS — Z79891 Long term (current) use of opiate analgesic: Secondary | ICD-10-CM

## 2019-05-02 DIAGNOSIS — R112 Nausea with vomiting, unspecified: Secondary | ICD-10-CM | POA: Diagnosis present

## 2019-05-02 DIAGNOSIS — R197 Diarrhea, unspecified: Secondary | ICD-10-CM | POA: Diagnosis present

## 2019-05-02 DIAGNOSIS — Z79899 Other long term (current) drug therapy: Secondary | ICD-10-CM

## 2019-05-02 DIAGNOSIS — C250 Malignant neoplasm of head of pancreas: Secondary | ICD-10-CM

## 2019-05-02 DIAGNOSIS — Z8673 Personal history of transient ischemic attack (TIA), and cerebral infarction without residual deficits: Secondary | ICD-10-CM

## 2019-05-02 DIAGNOSIS — Z95828 Presence of other vascular implants and grafts: Secondary | ICD-10-CM

## 2019-05-02 DIAGNOSIS — K909 Intestinal malabsorption, unspecified: Secondary | ICD-10-CM

## 2019-05-02 DIAGNOSIS — K521 Toxic gastroenteritis and colitis: Secondary | ICD-10-CM | POA: Diagnosis not present

## 2019-05-02 DIAGNOSIS — K8681 Exocrine pancreatic insufficiency: Secondary | ICD-10-CM | POA: Diagnosis present

## 2019-05-02 DIAGNOSIS — K1230 Oral mucositis (ulcerative), unspecified: Secondary | ICD-10-CM | POA: Diagnosis present

## 2019-05-02 DIAGNOSIS — R109 Unspecified abdominal pain: Secondary | ICD-10-CM | POA: Diagnosis present

## 2019-05-02 DIAGNOSIS — R140 Abdominal distension (gaseous): Secondary | ICD-10-CM

## 2019-05-02 DIAGNOSIS — Z66 Do not resuscitate: Secondary | ICD-10-CM | POA: Diagnosis present

## 2019-05-02 DIAGNOSIS — H409 Unspecified glaucoma: Secondary | ICD-10-CM | POA: Diagnosis present

## 2019-05-02 DIAGNOSIS — E860 Dehydration: Secondary | ICD-10-CM | POA: Diagnosis not present

## 2019-05-02 DIAGNOSIS — D490 Neoplasm of unspecified behavior of digestive system: Secondary | ICD-10-CM

## 2019-05-02 DIAGNOSIS — Z7189 Other specified counseling: Secondary | ICD-10-CM

## 2019-05-02 DIAGNOSIS — Z515 Encounter for palliative care: Secondary | ICD-10-CM | POA: Diagnosis not present

## 2019-05-02 DIAGNOSIS — Z96642 Presence of left artificial hip joint: Secondary | ICD-10-CM | POA: Diagnosis present

## 2019-05-02 DIAGNOSIS — M8580 Other specified disorders of bone density and structure, unspecified site: Secondary | ICD-10-CM | POA: Diagnosis present

## 2019-05-02 DIAGNOSIS — E876 Hypokalemia: Secondary | ICD-10-CM | POA: Diagnosis not present

## 2019-05-02 DIAGNOSIS — E43 Unspecified severe protein-calorie malnutrition: Secondary | ICD-10-CM

## 2019-05-02 DIAGNOSIS — Z6823 Body mass index (BMI) 23.0-23.9, adult: Secondary | ICD-10-CM

## 2019-05-02 DIAGNOSIS — K520 Gastroenteritis and colitis due to radiation: Secondary | ICD-10-CM | POA: Diagnosis present

## 2019-05-02 DIAGNOSIS — T451X5A Adverse effect of antineoplastic and immunosuppressive drugs, initial encounter: Secondary | ICD-10-CM | POA: Diagnosis present

## 2019-05-02 DIAGNOSIS — Y842 Radiological procedure and radiotherapy as the cause of abnormal reaction of the patient, or of later complication, without mention of misadventure at the time of the procedure: Secondary | ICD-10-CM | POA: Diagnosis present

## 2019-05-02 DIAGNOSIS — Z20822 Contact with and (suspected) exposure to covid-19: Secondary | ICD-10-CM | POA: Diagnosis present

## 2019-05-02 HISTORY — DX: Malignant (primary) neoplasm, unspecified: C80.1

## 2019-05-02 LAB — CBC WITH DIFFERENTIAL/PLATELET
Abs Immature Granulocytes: 0.1 10*3/uL — ABNORMAL HIGH (ref 0.00–0.07)
Band Neutrophils: 37 %
Basophils Absolute: 0 10*3/uL (ref 0.0–0.1)
Basophils Relative: 0 %
Eosinophils Absolute: 0 10*3/uL (ref 0.0–0.5)
Eosinophils Relative: 0 %
HCT: 32.5 % — ABNORMAL LOW (ref 36.0–46.0)
Hemoglobin: 10.8 g/dL — ABNORMAL LOW (ref 12.0–15.0)
Lymphocytes Relative: 11 %
Lymphs Abs: 0.2 10*3/uL — ABNORMAL LOW (ref 0.7–4.0)
MCH: 34 pg (ref 26.0–34.0)
MCHC: 33.2 g/dL (ref 30.0–36.0)
MCV: 102.2 fL — ABNORMAL HIGH (ref 80.0–100.0)
Metamyelocytes Relative: 4 %
Monocytes Absolute: 0.6 10*3/uL (ref 0.1–1.0)
Monocytes Relative: 27 %
Neutro Abs: 1.3 10*3/uL — ABNORMAL LOW (ref 1.7–7.7)
Neutrophils Relative %: 21 %
Platelets: 288 10*3/uL (ref 150–400)
RBC: 3.18 MIL/uL — ABNORMAL LOW (ref 3.87–5.11)
RDW: 16.2 % — ABNORMAL HIGH (ref 11.5–15.5)
WBC Morphology: INCREASED
WBC: 2.2 10*3/uL — ABNORMAL LOW (ref 4.0–10.5)
nRBC: 0.9 % — ABNORMAL HIGH (ref 0.0–0.2)

## 2019-05-02 LAB — COMPREHENSIVE METABOLIC PANEL
ALT: 13 U/L (ref 0–44)
AST: 17 U/L (ref 15–41)
Albumin: 2.8 g/dL — ABNORMAL LOW (ref 3.5–5.0)
Alkaline Phosphatase: 241 U/L — ABNORMAL HIGH (ref 38–126)
Anion gap: 14 (ref 5–15)
BUN: 8 mg/dL (ref 8–23)
CO2: 20 mmol/L — ABNORMAL LOW (ref 22–32)
Calcium: 7.8 mg/dL — ABNORMAL LOW (ref 8.9–10.3)
Chloride: 105 mmol/L (ref 98–111)
Creatinine, Ser: 0.63 mg/dL (ref 0.44–1.00)
GFR calc Af Amer: >60 mL/min (ref >60)
GFR calc non Af Amer: >60 mL/min (ref >60)
Glucose, Bld: 132 mg/dL — ABNORMAL HIGH (ref 70–99)
Potassium: 2.4 mmol/L — CL (ref 3.5–5.1)
Sodium: 139 mmol/L (ref 135–145)
Total Bilirubin: 0.8 mg/dL (ref 0.3–1.2)
Total Protein: 6.1 g/dL — ABNORMAL LOW (ref 6.5–8.1)

## 2019-05-02 LAB — MAGNESIUM: Magnesium: 1.6 mg/dL — ABNORMAL LOW (ref 1.7–2.4)

## 2019-05-02 MED ORDER — MAGNESIUM SULFATE 2 GM/50ML IV SOLN
2.0000 g | INTRAVENOUS | Status: DC
  Filled 2019-05-02: qty 50

## 2019-05-02 MED ORDER — DIPHENOXYLATE-ATROPINE 2.5-0.025 MG PO TABS
1.0000 | ORAL_TABLET | Freq: Four times a day (QID) | ORAL | Status: DC | PRN
  Administered 2019-05-05: 2 via ORAL
  Administered 2019-05-05 – 2019-05-06 (×3): 1 via ORAL
  Administered 2019-05-07 – 2019-05-08 (×2): 2 via ORAL
  Filled 2019-05-02: qty 1
  Filled 2019-05-02 (×2): qty 2
  Filled 2019-05-02: qty 1
  Filled 2019-05-02: qty 2
  Filled 2019-05-02: qty 1
  Filled 2019-05-02 (×2): qty 2

## 2019-05-02 MED ORDER — POTASSIUM CHLORIDE 10 MEQ/100ML IV SOLN
10.0000 meq | INTRAVENOUS | Status: AC
  Administered 2019-05-02 (×3): 10 meq via INTRAVENOUS
  Filled 2019-05-02 (×4): qty 100

## 2019-05-02 MED ORDER — MAGIC MOUTHWASH
5.0000 mL | Freq: Four times a day (QID) | ORAL | Status: DC | PRN
  Filled 2019-05-02: qty 10

## 2019-05-02 MED ORDER — SODIUM CHLORIDE 0.9% FLUSH
10.0000 mL | Freq: Once | INTRAVENOUS | Status: AC
  Administered 2019-05-02: 09:00:00 10 mL via INTRAVENOUS
  Filled 2019-05-02: qty 10

## 2019-05-02 MED ORDER — ENOXAPARIN SODIUM 40 MG/0.4ML ~~LOC~~ SOLN
40.0000 mg | SUBCUTANEOUS | Status: DC
  Administered 2019-05-03 – 2019-05-06 (×5): 40 mg via SUBCUTANEOUS
  Filled 2019-05-02 (×6): qty 0.4

## 2019-05-02 MED ORDER — LIDOCAINE VISCOUS HCL 2 % MT SOLN
5.0000 mL | Freq: Four times a day (QID) | OROMUCOSAL | Status: DC | PRN
  Filled 2019-05-02: qty 15

## 2019-05-02 MED ORDER — OCTREOTIDE ACETATE 100 MCG/ML IJ SOLN
150.0000 ug | Freq: Three times a day (TID) | INTRAMUSCULAR | Status: DC
  Administered 2019-05-03 – 2019-05-07 (×15): 150 ug via SUBCUTANEOUS
  Administered 2019-05-08: 200 ug via SUBCUTANEOUS
  Filled 2019-05-02 (×21): qty 1.5

## 2019-05-02 MED ORDER — ACETAMINOPHEN 500 MG PO TABS: 500.0000 mg | ORAL_TABLET | Freq: Four times a day (QID) | ORAL | Status: DC | PRN

## 2019-05-02 MED ORDER — GABAPENTIN 300 MG PO CAPS
300.0000 mg | ORAL_CAPSULE | Freq: Two times a day (BID) | ORAL | Status: DC
  Administered 2019-05-03 – 2019-05-08 (×11): 300 mg via ORAL
  Filled 2019-05-02 (×12): qty 1

## 2019-05-02 MED ORDER — MAGIC MOUTHWASH W/LIDOCAINE: 5.0000 mL | Freq: Four times a day (QID) | ORAL | Status: DC | PRN

## 2019-05-02 MED ORDER — OXYCODONE HCL 5 MG PO TABS
5.0000 mg | ORAL_TABLET | Freq: Four times a day (QID) | ORAL | Status: DC | PRN
  Administered 2019-05-03 – 2019-05-08 (×8): 5 mg via ORAL
  Filled 2019-05-02 (×8): qty 1

## 2019-05-02 MED ORDER — HYDRALAZINE HCL 25 MG PO TABS: 25.0000 mg | ORAL_TABLET | Freq: Three times a day (TID) | ORAL | Status: DC | PRN

## 2019-05-02 MED ORDER — ONDANSETRON HCL 4 MG/2ML IJ SOLN
4.0000 mg | Freq: Once | INTRAMUSCULAR | Status: DC
  Filled 2019-05-02: qty 2

## 2019-05-02 MED ORDER — MAGNESIUM SULFATE 4 GM/100ML IV SOLN
4.0000 g | Freq: Once | INTRAVENOUS | Status: AC
  Administered 2019-05-02: 4 g via INTRAVENOUS
  Filled 2019-05-02: qty 100

## 2019-05-02 MED ORDER — ONDANSETRON HCL 4 MG/2ML IJ SOLN
4.0000 mg | Freq: Three times a day (TID) | INTRAMUSCULAR | Status: DC | PRN
  Administered 2019-05-02: 4 mg via INTRAVENOUS
  Filled 2019-05-02: qty 2

## 2019-05-02 MED ORDER — POTASSIUM CHLORIDE 20 MEQ PO PACK
40.0000 meq | PACK | Freq: Once | ORAL | Status: DC
  Filled 2019-05-02: qty 2

## 2019-05-02 MED ORDER — SODIUM CHLORIDE 0.9 % IV BOLUS
1000.0000 mL | Freq: Once | INTRAVENOUS | Status: AC
  Administered 2019-05-02: 1000 mL via INTRAVENOUS

## 2019-05-02 MED ORDER — POTASSIUM CHLORIDE CRYS ER 20 MEQ PO TBCR
40.0000 meq | EXTENDED_RELEASE_TABLET | ORAL | Status: AC
  Administered 2019-05-02 – 2019-05-03 (×3): 40 meq via ORAL
  Filled 2019-05-02 (×3): qty 2

## 2019-05-02 MED ORDER — SODIUM CHLORIDE 0.9 % IV SOLN: INTRAVENOUS | Status: DC

## 2019-05-02 MED ORDER — LOPERAMIDE HCL 2 MG PO CAPS
2.0000 mg | ORAL_CAPSULE | ORAL | Status: DC
  Administered 2019-05-03: 2 mg via ORAL
  Filled 2019-05-02 (×2): qty 1

## 2019-05-02 NOTE — Telephone Encounter (Signed)
Oral Oncology Patient Advocate Encounter  Received notification from [facility] that prior authorization for Octreotide is required.  PA submitted on CoverMyMeds on 04/29/19 Key [id number] Status is pending  Oral Oncology Clinic will continue to follow.  [place] Patient [place] Phone [phone] Fax [phone]

## 2019-05-02 NOTE — Telephone Encounter (Signed)
Oral Oncology Patient Advocate Encounter  Prior Authorization for Octreotide has been approved.    PA# [id number] Effective dates: 04/29/19 - until further notice  Patients co-pay is $776.86.  Oral Oncology Clinic will continue to follow.   [place] Patient [place] Phone [phone] Fax [phone] 05/02/2019 9:10 AM

## 2019-05-02 NOTE — Progress Notes (Signed)
Patient's diarrhea has not improved.  She has been to the bathroom with loose stools 4 times since 3 am today.  Was not able to get Octreotide med for the weekend.

## 2019-05-02 NOTE — Progress Notes (Signed)
Hematology/Oncology follow up note [facility] Telephone:([phone] Fax:([phone]) [phone]   Patient Care Team: Adin Hector, MD as PCP - General (Internal Medicine) Clent Jacks, RN as Oncology Nurse Navigator  REFERRING PROVIDER: Adin Hector, MD  CHIEF COMPLAINTS/REASON FOR VISIT:  Follow-up for pancreatic cancer   HISTORY OF PRESENTING ILLNESS:   Melissa [name] is a  79 y.o.  female with PMH listed below was seen in consultation at the request of  Adin Hector, MD  for evaluation of abnormal pancreatic disease. Patient was recently seen by primary care provider Dr. Caryl Comes for evaluation of nausea, diarrhea and jaundice.  Blood work on 10/19/2018 showed potassium 2.9, bilirubin 15, alkaline phosphatase 478, AST 114, ALT 95. Abdomen pelvis CT scan on 10/20/2018 showed moderate to market intra-and extra hepatic biliary dilation with mild diffuse dilatation of the pancreatic duct. Patient has had poor appetite and has lost a 5 to 10 pounds recently. She also had diarrhea.  She noticed sand like stool for 1- 2 weeks.  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  She placed her mother to assisted living yesterday. She has had discussion with Dr. Caryl Comes about her blood work and CT scans.  She understands that there is a strong suspicion of cancer.  Nonessential medication has been stopped. CA 19-9 was check on 10/21/2018, level was elevated at 1500.  Today she denies any pain.  Itchy all over her body.. Per patient's request, patient's daughter [name] was [name] and [name] was able to hear entire clinical encounter conversation and participate in the reported history and discussion.  # Patient was admitted from 10/29/2018-10/31/2018.  Status post ERCP and biliary stenting.  Patient had duodenum ampulla biopsy showed nondiagnostic.  Negative for  invasive carcinoma. Bilirubin trended down to 11 at the time of discharge on 10/31/2018.  #PET scan images were independent reviewed by me and discussed with patient. cTxN2 M1a Patient has at least M1 a disease due to retroperitoneal/peri-aortic lymph node involvement,  sclerotic lesion of right proximal femur with mild hypermetabolic activity, as well as left sacrum  # EUS biopsy of pancreatic mass showed positive for malignancy, Adenocarcinoma. There is not enough tissue for NGS testing.  Attempt to at MMR, still not enough tissue to complete the testing.  # liquid biopsy sent on 03/22/2019- no reportable alteration  blood tumor burden 61mts/mb  INTERVAL HISTORY Melissa ZAHNISERis a 79y.o. female who has above history reviewed by me today presents for follow up visit for management of stage IV pancreatic cancer, management of chemotherapy-induced diarrhea. Patient continues to have multiple bouts of watery bowel movements. She has previously tried Imodium with no improvements.  Switched to Lomotil 1 to 2 pills every 4 hours as needed which did not help. 04/29/2019, I give her 1 dose of octreotide 150 MCG x1 at the cancer center.  Patient reports octreotide improved her symptoms.  She received octreotide injection around 4 PM, and her diarrhea slowed down and she does not have any bowel movements into the next day morning.  I have given her a prescription of octreotide 100 MCG 3 times daily and it was authorized by insurance however with a significant high co-pay of $1000.  Patient cannot afford the co-pay and there is no patient assistance program available.  She reports not able to eat or drink  anything as anything she eats or drinks " goes right out of her body" as watery diarrhea. Appetite also is decreased Patient feels very weak.   Review of Systems  Constitutional: Positive for fatigue. Negative for appetite change, chills and fever.  HENT:   Negative for hearing loss and voice  change.   Eyes: Negative for eye problems and icterus.  Respiratory: Negative for chest tightness and cough.   Cardiovascular: Positive for leg swelling. Negative for chest pain.  Gastrointestinal: Positive for diarrhea. Negative for abdominal distention, abdominal pain and blood in stool.  Endocrine: Negative for hot flashes.  Genitourinary: Negative for difficulty urinating and frequency.   Musculoskeletal: Positive for back pain. Negative for arthralgias.  Skin: Negative for itching and rash.  Neurological: Positive for numbness. Negative for extremity weakness.  Hematological: Negative for adenopathy.  Psychiatric/Behavioral: Negative for confusion.    MEDICAL HISTORY:  Past Medical History:  Diagnosis Date  . [facility])    pancreatic   . Glaucoma   . Hypertension   . Osteopenia   . Stroke [facility])     SURGICAL HISTORY: Past Surgical History:  Procedure Laterality Date  . BREAST BIOPSY Right    core- stereo- neg  . ERCP N/A 10/29/2018   Procedure: ENDOSCOPIC RETROGRADE CHOLANGIOPANCREATOGRAPHY (ERCP);  Surgeon: [name], MD;  Location: [facility] ENDOSCOPY;  Service: Endoscopy;  Laterality: N/A;  . ERCP N/A 01/28/2019   Procedure: ENDOSCOPIC RETROGRADE CHOLANGIOPANCREATOGRAPHY (ERCP) STENT REMOVAL;  Surgeon: [name], MD;  Location: [facility] ENDOSCOPY;  Service: Endoscopy;  Laterality: N/A;  . EUS N/A 11/18/2018   Procedure: FULL UPPER ENDOSCOPIC ULTRASOUND (EUS) RADIAL;  Surgeon: Holly Bodily, MD;  Location[facility] ENDOSCOPY;  Service: Gastroenterology;  Laterality: N/A;  . HIP ARTHROPLASTY Left 01/02/2016   Procedure: ARTHROPLASTY BIPOLAR HIP (HEMIARTHROPLASTY);  Surgeon: Dereck Leep, MD;  Location: [facility] ORS;  Service: Orthopedics;  Laterality: Left;  . JOINT REPLACEMENT Left    THR  . PORTACATH PLACEMENT Right 11/26/2018   Procedure: INSERTION PORT-A-CATH;  Surgeon: Jules Husbands, MD;  Location: [facility] ORS;  Service: General;  Laterality: Right;  . TONSILLECTOMY       SOCIAL HISTORY: Social History   Socioeconomic History  . Marital status: Married    Spouse name: Not on file  . Number of children: Not on file  . Years of education: Not on file  . Highest education level: Not on file  Occupational History  . Not on file  Tobacco Use  . Smoking status: Never Smoker  . Smokeless tobacco: Never Used  Substance and Sexual Activity  . Alcohol use: No  . Drug use: Never  . Sexual activity: Not on file  Other Topics Concern  . Not on file  Social History Narrative  . Not on file   Social Determinants of Health   Financial Resource Strain:   . Difficulty of Paying Living Expenses: Not on file  Food Insecurity:   . Worried About Charity fundraiser in the Last Year: Not on file  . Ran Out of Food in the Last Year: Not on file  Transportation Needs:   . Lack of Transportation (Medical): Not on file  . Lack of Transportation (Non-Medical): Not on file  Physical Activity:   . Days of Exercise per Week: Not on file  . Minutes of Exercise per Session: Not on file  Stress:   . Feeling of Stress : Not on file  Social Connections:   . Frequency of Communication with Friends and  Family: Not on file  . Frequency of Social Gatherings with Friends and Family: Not on file  . Attends Religious Services: Not on file  . Active Member of Clubs or Organizations: Not on file  . Attends Archivist Meetings: Not on file  . Marital Status: Not on file  Intimate Partner Violence:   . Fear of Current or Ex-Partner: Not on file  . Emotionally Abused: Not on file  . Physically Abused: Not on file  . Sexually Abused: Not on file    FAMILY HISTORY: Family History  Problem Relation Age of Onset  . Kidney disease Father   . Breast cancer Neg Hx     ALLERGIES:  has No Known Allergies.  MEDICATIONS:  Current Outpatient Medications  Medication Sig Dispense Refill  . acetaminophen (TYLENOL) 500 MG tablet Take 500 mg by mouth every 6 (six)  hours as needed.    Marland Kitchen amLODipine (NORVASC) 10 MG tablet Take 10 mg by mouth at bedtime.     Marland Kitchen dexamethasone (DECADRON) 4 MG tablet Take 2 tablets (8 mg total) by mouth daily. Start the day after irinotecan chemotherapy for 2 days. 8 tablet 5  . diphenoxylate-atropine (LOMOTIL) 2.5-0.025 MG tablet Take 1-2 tablets by mouth 4 (four) times daily as needed for diarrhea or loose stools. 60 tablet 0  . gabapentin (NEURONTIN) 300 MG capsule Take 1 capsule (300 mg total) by mouth 2 (two) times daily. 60 capsule 0  . magic mouthwash w/lidocaine SOLN Take 5 mLs by mouth 4 (four) times daily as needed for mouth pain. Sig: Swish/Swallow 5-10 ml four times a day as needed. Dispense 480 ml. 1RF 480 mL 1  . nystatin (MYCOSTATIN) 100000 UNIT/ML suspension Take 5 mLs (500,000 Units total) by mouth 4 (four) times daily. 473 mL 0  . ondansetron (ZOFRAN) 8 MG tablet Take 1 tablet by mouth as needed.    Marland Kitchen oxyCODONE (ROXICODONE) 5 MG immediate release tablet Take 1 tablet (5 mg total) by mouth every 6 (six) hours as needed for moderate pain or severe pain. 60 tablet 0  . potassium chloride SA (KLOR-CON) 20 MEQ tablet Take 1 tablet (20 mEq total) by mouth daily. 14 tablet 0  . valACYclovir (VALTREX) 500 MG tablet Take 1 tablet (500 mg total) by mouth 2 (two) times daily. 60 tablet 0  . lidocaine-prilocaine (EMLA) cream Apply to affected area once (Patient not taking: Reported on 05/02/2019) 30 g 3  . loperamide (IMODIUM A-D) 2 MG tablet Take 1 tablet (2 mg total) by mouth See admin instructions. Take 2 at diarrhea onset , then 1 every 2hr until 12hrs with no BM. May take 2 every 4hrs at night. If diarrhea recurs repeat. (Patient not taking: Reported on 04/29/2019) 100 tablet 1  . octreotide (SANDOSTATIN) 100 MCG/ML SOLN injection Inject 1 mL (100 mcg total) into the skin in the morning, at noon, and at bedtime. (Patient not taking: Reported on 05/02/2019) 30 mL 0  . prochlorperazine (COMPAZINE) 10 MG tablet Take 1 tablet (10 mg  total) by mouth every 6 (six) hours as needed (NAUSEA). (Patient not taking: Reported on 04/29/2019) 30 tablet 1   No current facility-administered medications for this visit.     PHYSICAL EXAMINATION: ECOG PERFORMANCE STATUS: 2 - Symptomatic, <50% confined to bed Vitals:   05/02/19 1000  BP: 140/75  Pulse: 96  Resp: 12  Temp: (!) 96.7 F (35.9 C)   Filed Weights   05/02/19 1000  Weight: 124 lb (56.2 kg)  Physical Exam Constitutional:      General: She is not in acute distress.    Appearance: She is ill-appearing.     Comments: Frail, sits in wheelchair.  HENT:     Head: Normocephalic and atraumatic.     Comments: Alopecia    Mouth/Throat:     Comments: Mild mucositis, + thrush Eyes:     General: No scleral icterus.    Pupils: Pupils are equal, round, and reactive to light.  Cardiovascular:     Rate and Rhythm: Normal rate and regular rhythm.     Heart sounds: Normal heart sounds.  Pulmonary:     Effort: Pulmonary effort is normal. No respiratory distress.     Breath sounds: Normal breath sounds. No wheezing.  Abdominal:     General: Bowel sounds are normal. There is no distension.     Palpations: Abdomen is soft. There is no mass.     Tenderness: There is no abdominal tenderness.  Musculoskeletal:        General: Swelling present. No deformity. Normal range of motion.     Cervical back: Normal range of motion and neck supple.  Skin:    General: Skin is warm and dry.     Coloration: Skin is not jaundiced.     Findings: No erythema or rash.  Neurological:     Mental Status: She is alert and oriented to person, place, and time. Mental status is at baseline.     Cranial Nerves: No cranial nerve deficit.     Coordination: Coordination normal.  Psychiatric:        Mood and Affect: Mood normal.     LABORATORY DATA:  I have reviewed the data as listed Lab Results  Component Value Date   WBC 2.2 (L) 05/02/2019   HGB 10.8 (L) 05/02/2019   HCT 32.5 (L)  05/02/2019   MCV 102.2 (H) 05/02/2019   PLT 288 05/02/2019   Recent Labs    10/29/18 1318 10/30/18 0531 04/19/19 0834 04/19/19 0834 04/26/19 1305 04/29/19 0940 05/02/19 0917  NA 135   < > 139   < > 133* 134* 139  K 3.0*   < > 3.3*   < > 3.1* 2.8* 2.4*  CL 104   < > 105   < > 102 102 105  CO2 21*   < > 22   < > 21* 21* 20*  GLUCOSE 110*   < > 150*   < > 141* 141* 132*  BUN 13   < > 13   < > _0 CREATININE 0.32*   < > 0.79   < > 0.60 0.61 0.63  CALCIUM 9.0   < > 8.4*   < > 7.9* 7.8* 7.8*  GFRNONAA >60   < > >60   < > >60 >60 >60  GFRAA >60   < > >60   < > >60 >60 >60  PROT 7.0   < > 6.4*  --  6.3*  --  6.1*  ALBUMIN 2.6*   < > 2.9*  --  2.8*  --  2.8*  AST 119*   < > 25  --  23  --  17  ALT 79*   < > 16  --  17  --  13  ALKPHOS 399*   < > 291*  --  266*  --  241*  BILITOT 18.2*   < > 0.6  --  0.7  --  0.8  BILIDIR 10.2*  --   --   --   --   --   --    < > =  values in this interval not displayed.   Iron/TIBC/Ferritin/ %Sat    Component Value Date/Time   IRON 117 04/13/2019 0951   TIBC 223 (L) 04/13/2019 0951   FERRITIN 1,108 (H) 04/13/2019 0951   IRONPCTSAT 53 (H) 04/13/2019 7672    RADIOGRAPHIC STUDIES: I have personally reviewed the radiological images as listed and agreed with the findings in the report.  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 Restaging. EXAM: CT CHEST, ABDOMEN, AND PELVIS WITH CONTRAST TECHNIQUE: Multidetector CT imaging of the chest, abdomen and pelvis was performed following the standard protocol during bolus administration of intravenous contrast. CONTRAST:  174m OMNIPAQUE IOHEXOL 300 MG/ML  SOLN COMPARISON:  02/15/2019 CT abdomen/pelvis.  11/09/2018 PET-CT. FINDINGS: CT CHEST FINDINGS Cardiovascular: Top-normal heart size. Right internal jugular Port-A-Cath terminates at the cavoatrial junction. No significant pericardial  effusion/thickening. Atherosclerotic nonaneurysmal thoracic aorta. Normal caliber pulmonary arteries. No central pulmonary emboli. Mediastinum/Nodes: Heterogeneous thyroid gland with subcentimeter bilateral thyroid nodules requiring no follow-up. Unremarkable esophagus. No axillary or hilar adenopathy. Mildly enlarged 1.0 cm lower right paraesophageal node (series 2/image 34), stable since 11/09/2018 PET-CT. Left retrocrural enlarged 1.4 cm node (series 2/image 46), increased from 0.9 cm on 02/15/2019 CT abdomen. No additional pathologically enlarged mediastinal nodes. Lungs/Pleura: No pneumothorax. No pleural effusion. Anterior right middle lobe 3 mm solid pulmonary nodule (series 4/image 80) is stable since 11/09/2018 PET-CT. Mild platelike scarring versus atelectasis at both lung bases. No acute consolidative airspace disease, lung masses or new significant pulmonary nodules. Musculoskeletal: Several new sclerotic lesions throughout thoracic osseous structures including the sternum, bilateral ribs, left superior scapula, left clavicle and thoracic spine, new since 11/09/2018 PET-CT. Moderate thoracic spondylosis. CT ABDOMEN PELVIS FINDINGS Hepatobiliary: Normal liver size. Simple lateral segment left liver lobe 1.0 cm cyst is stable. Subcentimeter hypodense segment 4B left liver lobe lesion is new (series 2/image 57). Subcentimeter hypodense segment 6 right liver lesion (series 2/image 58) is stable. Subcentimeter segment 8 right liver dome hypodense lesion (series 2/image 39) is stable. No additional new liver lesions. Numerous calcified gallstones throughout the gallbladder with stable mild diffuse gallbladder wall thickening. Pneumobilia in the intrahepatic left liver lobe bile ducts with stable mild central intrahepatic biliary ductal dilatation. CBD stent well positioned with distal tip in the duodenal lumen just beyond the ampulla. Pneumobilia throughout the stent lumen. Pancreas: Stable mild fullness of  the pancreatic head with an distinct surrounding soft tissue planes and no discrete measurable pancreatic mass by CT. Stable mild diffuse pancreatic duct dilation. No new pancreatic lesions. Spleen: Normal size. No mass. Adrenals/Urinary Tract: Normal adrenals. New moderate to severe right hydroureteronephrosis to the level of the lower right lumbar ureter without discrete obstructing stone or mass, although with mild urothelial wall thickening at the site of caliber transition of the right ureter (series 2/image 86). No left hydronephrosis. No renal masses. Bladder nondistended and obscured by streak artifact from left hip hardware. Stomach/Bowel: Small hiatal hernia. Otherwise normal nondistended stomach. Normal caliber small bowel with no small bowel wall thickening. Normal appendix. Mild sigmoid diverticulosis, with no large bowel wall thickening or acute pericolonic fat stranding. Vascular/Lymphatic: Atherosclerotic nonaneurysmal abdominal aorta. Patent portal, splenic, hepatic and renal veins. Stable mildly enlarged 1.0 cm gastrohepatic ligament lymph node (series 2/image 48). Enlarged 1.4 cm porta hepatis node (series 2/image 51), previously 1.3 cm on 02/15/2019 CT, not appreciably changed. Stable enlarged 1.3 cm portacaval node (series 2/image 58). Enlarged 2.2 cm aortocaval  node (series 2/image 53), previously 1.9 cm, mildly increased. Enlarged 1.7 cm left para-aortic node (series 2/image 60), previously 1.6 cm, not substantially changed. No pathologically enlarged pelvic lymph nodes. Reproductive: Grossly normal uterus.  No adnexal mass. Other: No pneumoperitoneum, ascites or focal fluid collection. Upper omental 1.3 cm soft tissue nodule (series 2/image 68), previously a 1.1 cm, slightly increased. Adjacent 0.7 cm upper omental soft tissue nodule (series 2/image 67), previously 0.5 cm, slightly increased. Small fat containing umbilical hernia. Musculoskeletal: Several sclerotic lesions scattered  throughout the lumbar spine, upper sacrum, bilateral iliac bones and right pubic rami are unchanged since 02/15/2019 CT. No new osseous lesions. Left total hip arthroplasty. Mild lumbar spondylosis. IMPRESSION: 1. Findings suggest mild progression of metastatic disease. 2. Left retrocrural and aortocaval lymphadenopathy is mildly increased. Lower right paraesophageal, gastrohepatic ligament, porta hepatis, portacaval and left para-aortic lymphadenopathy is stable. 3. Upper omental soft tissue nodules are slightly increased and likely represent peritoneal metastases. 4. Three scattered subcentimeter hypodense liver lesions, one of which is new and two of which are stable, suspicious for liver metastases. 5. 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 Well-positioned CBD stent with pneumobilia indicating stent patency. 6. 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 No discrete stone or mass, although there is mild urothelial wall thickening at the site of caliber transition in the right ureter. Findings are indeterminate for benign treatment related or malignant right ureteral stricture. 7. 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 Sclerotic osseous metastases throughout the chest are new since most recent comparison chest imaging of 11/09/2018 PET-CT, compatible with response to therapy. 8. Aortic Atherosclerosis (ICD10-I70.0). Additional chronic findings as detailed. Electronically Signed   By: Ilona Sorrel M.D.   On: 03/31/2019 13:25   MR Lumbar Spine W Wo Contrast  Result Date: 03/10/2019 CLINICAL DATA:  Pancreatic cancer. Osseous metastases on CT. EXAM: MRI LUMBAR SPINE WITHOUT AND WITH CONTRAST TECHNIQUE: Multiplanar and multiecho pulse sequences of the lumbar spine were obtained without and with  intravenous contrast. CONTRAST:  68m GADAVIST GADOBUTROL 1 MMOL/ML IV SOLN COMPARISON:  Abdominopelvic CT 02/15/2019. PET-CT 11/09/2018. FINDINGS: Segmentation: Conventional anatomy assumed, with the last open disc space designated L5-S1.Concordant with previous imaging. Alignment: Stable slight degenerative anterolisthesis at L3-4. Vertebrae: As seen on the recent CT, there is widespread osseous metastatic disease to the lumbar spine, sacrum and both iliac bones. Representative lesions on series 8 include a 2.2 cm lesion in the L2 vertebral body (image 12), a 2.4 cm lesion in the L4 vertebral body (image 26) and a 2.8 cm left iliac lesion on image 35. Stable mild superior endplate compression deformity at L1 without residual marrow edema. No evidence of acute pathologic fracture. These osseous metastases enhance following contrast. Conus medullaris: Extends to the L1 level and appears normal. No abnormal intradural enhancement or significant epidural tumor. Paraspinal and other soft tissues: There are multiple enlarged retroperitoneal lymph nodes consistent with nodal metastases. These are grossly stable compared with the recent CT, measuring up to 1.6 cm short axis in the aortocaval space (image 10/8). Disc levels: No significant disc space findings at T12-L1 or L1-2. L2-3: Mild disc bulging eccentric to the left, facet and ligamentous hypertrophy. Borderline spinal stenosis with mild left foraminal narrowing. L3-4: Loss of disc height with annular disc bulging. There is moderate facet and ligamentous hypertrophy accounting for the  grade 1 anterolisthesis. These factors contribute to severe spinal stenosis with moderate narrowing of both lateral recesses and mild left foraminal narrowing. L4-5: Relatively preserved disc height. Mild disc bulging with moderate facet and ligamentous hypertrophy. Mild spinal stenosis. No nerve root encroachment. L5-S1: Mild disc bulging with moderate asymmetric right-sided facet  hypertrophy. No significant spinal stenosis or nerve root encroachment. IMPRESSION: 1. 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 No evidence of acute pathologic fracture. 2. Stable mild superior endplate compression deformity at L1. No residual marrow edema. 3. Stable retroperitoneal nodal metastases. 4. Severe multifactorial spinal stenosis at L3-4 with moderate narrowing of both lateral recesses and mild left foraminal narrowing. 5. Stable mild multifactorial spinal stenosis at L2-3 and L4-5. Electronically Signed   By: Richardean Sale M.D.   On: 03/10/2019 20:13   NM Bone Scan Whole Body  Result Date: 03/31/2019 CLINICAL DATA:  Stage IV pancreatic cancer EXAM: NUCLEAR MEDICINE WHOLE BODY BONE SCAN TECHNIQUE: Whole body anterior and posterior images were obtained approximately 3 hours after intravenous injection of radiopharmaceutical. RADIOPHARMACEUTICALS:  22.989 mCi Technetium-35mMDP IV COMPARISON:  CT chest abdomen pelvis 03/31/2019, PET-CT 11/09/2018 FINDINGS: Numerous sites of abnormal osseous tracer accumulation are identified consistent with widespread osseous metastatic disease. These include calvaria, BILATERAL ribs, sternum, thoracic/lumbar spine, and pelvis. In addition, numerous foci of abnormal uptake are seen within BILATERAL humeri, BILATERAL femora, and proximal forearms likely radius. Many of these are identified on accompanying CT as well. RIGHT hydronephrosis and hydroureter are identified with abnormal thickening at the mid RIGHT ureter series 2, image 86. LEFT hip prosthesis incidentally noted. IMPRESSION: Widespread osseous metastatic disease including long bone metastases as above. RIGHT hydronephrosis and proximal hydroureter. Electronically Signed   By: MLavonia DanaM.D.   On: 03/31/2019 15:09   CT Abdomen Pelvis W Contrast  Result Date: 03/31/2019 CLINICAL DATA:  Stage IV  pancreatic adenocarcinoma diagnosed August 2020 on palliative chemotherapy. Restaging. EXAM: CT CHEST, ABDOMEN, AND PELVIS WITH CONTRAST TECHNIQUE: Multidetector CT imaging of the chest, abdomen and pelvis was performed following the standard protocol during bolus administration of intravenous contrast. CONTRAST:  1058mOMNIPAQUE IOHEXOL 300 MG/ML  SOLN COMPARISON:  02/15/2019 CT abdomen/pelvis.  11/09/2018 PET-CT. FINDINGS: CT CHEST FINDINGS Cardiovascular: Top-normal heart size. Right internal jugular Port-A-Cath terminates at the cavoatrial junction. No significant pericardial effusion/thickening. Atherosclerotic nonaneurysmal thoracic aorta. Normal caliber pulmonary arteries. No central pulmonary emboli. Mediastinum/Nodes: Heterogeneous thyroid gland with subcentimeter bilateral thyroid nodules requiring no follow-up. Unremarkable esophagus. No axillary or hilar adenopathy. Mildly enlarged 1.0 cm lower right paraesophageal node (series 2/image 34), stable since 11/09/2018 PET-CT. Left retrocrural enlarged 1.4 cm node (series 2/image 46), increased from 0.9 cm on 02/15/2019 CT abdomen. No additional pathologically enlarged mediastinal nodes. Lungs/Pleura: No pneumothorax. No pleural effusion. Anterior right middle lobe 3 mm solid pulmonary nodule (series 4/image 80) is stable since 11/09/2018 PET-CT. Mild platelike scarring versus atelectasis at both lung bases. No acute consolidative airspace disease, lung masses or new significant pulmonary nodules. Musculoskeletal: Several new sclerotic lesions throughout thoracic osseous structures including the sternum, bilateral ribs, left superior scapula, left clavicle and thoracic spine, new since 11/09/2018 PET-CT. Moderate thoracic spondylosis. CT ABDOMEN PELVIS FINDINGS Hepatobiliary: Normal liver size. Simple lateral segment left liver lobe 1.0 cm cyst is stable. Subcentimeter hypodense segment 4B left liver lobe lesion is new (series 2/image 57). Subcentimeter  hypodense segment 6 right liver lesion (series 2/image 58) is  stable. Subcentimeter segment 8 right liver dome hypodense lesion (series 2/image 39) is stable. No additional new liver lesions. Numerous calcified gallstones throughout the gallbladder with stable mild diffuse gallbladder wall thickening. Pneumobilia in the intrahepatic left liver lobe bile ducts with stable mild central intrahepatic biliary ductal dilatation. CBD stent well positioned with distal tip in the duodenal lumen just beyond the ampulla. Pneumobilia throughout the stent lumen. Pancreas: Stable mild fullness of the pancreatic head with an distinct surrounding soft tissue planes and no discrete measurable pancreatic mass by CT. Stable mild diffuse pancreatic duct dilation. No new pancreatic lesions. Spleen: Normal size. No mass. Adrenals/Urinary Tract: Normal adrenals. New moderate to severe right hydroureteronephrosis to the level of the lower right lumbar ureter without discrete obstructing stone or mass, although with mild urothelial wall thickening at the site of caliber transition of the right ureter (series 2/image 86). No left hydronephrosis. No renal masses. Bladder nondistended and obscured by streak artifact from left hip hardware. Stomach/Bowel: Small hiatal hernia. Otherwise normal nondistended stomach. Normal caliber small bowel with no small bowel wall thickening. Normal appendix. Mild sigmoid diverticulosis, with no large bowel wall thickening or acute pericolonic fat stranding. Vascular/Lymphatic: Atherosclerotic nonaneurysmal abdominal aorta. Patent portal, splenic, hepatic and renal veins. Stable mildly enlarged 1.0 cm gastrohepatic ligament lymph node (series 2/image 48). Enlarged 1.4 cm porta hepatis node (series 2/image 51), previously 1.3 cm on 02/15/2019 CT, not appreciably changed. Stable enlarged 1.3 cm portacaval node (series 2/image 58). Enlarged 2.2 cm aortocaval node (series 2/image 53), previously 1.9 cm, mildly  increased. Enlarged 1.7 cm left para-aortic node (series 2/image 60), previously 1.6 cm, not substantially changed. No pathologically enlarged pelvic lymph nodes. Reproductive: Grossly normal uterus.  No adnexal mass. Other: No pneumoperitoneum, ascites or focal fluid collection. Upper omental 1.3 cm soft tissue nodule (series 2/image 68), previously a 1.1 cm, slightly increased. Adjacent 0.7 cm upper omental soft tissue nodule (series 2/image 67), previously 0.5 cm, slightly increased. Small fat containing umbilical hernia. Musculoskeletal: Several sclerotic lesions scattered throughout the lumbar spine, upper sacrum, bilateral iliac bones and right pubic rami are unchanged since 02/15/2019 CT. No new osseous lesions. Left total hip arthroplasty. Mild lumbar spondylosis. IMPRESSION: 1. Findings suggest mild progression of metastatic disease. 2. Left retrocrural and aortocaval lymphadenopathy is mildly increased. Lower right paraesophageal, gastrohepatic ligament, porta hepatis, portacaval and left para-aortic lymphadenopathy is stable. 3. Upper omental soft tissue nodules are slightly increased and likely represent peritoneal metastases. 4. Three scattered subcentimeter hypodense liver lesions, one of which is new and two of which are stable, suspicious for liver metastases. 5. 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 Well-positioned CBD stent with pneumobilia indicating stent patency. 6. 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 No discrete stone or mass, although there is mild urothelial wall thickening at the site of caliber transition in the right ureter. Findings are indeterminate for benign treatment related or malignant right ureteral stricture. 7. 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  Sclerotic osseous metastases throughout the chest are new since most recent comparison chest imaging of 11/09/2018 PET-CT, compatible with response to therapy. 8. Aortic Atherosclerosis (ICD10-I70.0). Additional chronic findings as detailed. Electronically Signed   By: Ilona Sorrel M.D.   On: 03/31/2019 13:25   CT Abdomen Pelvis W Contrast  Result Date: 02/15/2019 CLINICAL DATA:  Pancreatic adenocarcinoma. EXAM: CT  ABDOMEN AND PELVIS WITH CONTRAST TECHNIQUE: Multidetector CT imaging of the abdomen and pelvis was performed using the standard protocol following bolus administration of intravenous contrast. CONTRAST:  71m OMNIPAQUE IOHEXOL 300 MG/ML  SOLN COMPARISON:  10/20/2018 FINDINGS: Lower chest: Unremarkable. Hepatobiliary: 8 mm hypodensity in the inferior right liver adjacent to the gallbladder (25/2) is new in the interval. 9 mm hypodensity in the tip of the left liver is stable and likely a cyst. Another 5 mm hypodensity in the dome of the liver is stable. Multiple gallstones evident with gas visible in the gallbladder lumen. Intrahepatic biliary duct dilatation seen previously has decreased with common bile duct stent visualized in situ. Pancreas: Pancreatic parenchymal atrophy noted with dilatation of the main duct in the body and tail of pancreas. No discrete pancreatic mass lesion evident although an ill-defined area of hypoenhancement in the head of the pancreas measures approximately 3 x 2.6 cm today. Spleen: No splenomegaly. No focal mass lesion. Adrenals/Urinary Tract: No adrenal nodule or mass. Kidneys unremarkable. No evidence for hydroureter. The urinary bladder appears normal for the degree of distention. Stomach/Bowel: Tiny hiatal hernia. Stomach otherwise unremarkable. Duodenum is normally positioned as is the ligament of Treitz. No small bowel wall thickening. No small bowel dilatation. The terminal ileum is normal. The appendix is normal. No gross colonic mass. No colonic wall thickening.  Diverticular changes are noted in the left colon without evidence of diverticulitis. Vascular/Lymphatic: There is abdominal aortic atherosclerosis without aneurysm. Portal vein, superior mesenteric vein, and splenic vein are patent. Celiac axis and SMA are patent. Mild lymphadenopathy in the gastrohepatic and hepato duodenal ligaments is not substantially changed. Index gastrohepatic ligament lymph node measures 9 mm short axis on 14/2. 15 mm retrocaval node on 21/2 was 18 mm short axis previously. Left para-aortic node measuring 1.4 cm short axis on 23/2 was 1.4 cm previously. No pelvic sidewall lymphadenopathy. Reproductive: The uterus is unremarkable.  There is no adnexal mass. Other: Trace free fluid noted in the cul-de-sac. Musculoskeletal: Status post left total hip replacement. Interval development of multiple lytic and sclerotic bone lesions consistent with metastatic involvement. 2.8 cm lesion identified left iliac bone on 47/2. New 13 mm lytic and sclerotic lesion in the left L2 vertebral body noted on 24/2. 14 mm sclerotic lesion in the sacrum visible on 51/2. IMPRESSION: 1. Interval development of multiple lytic and sclerotic bone lesions consistent with bony metastatic involvement. 2. Decrease in intrahepatic biliary duct dilatation with common bile duct stent visualized in situ. 3. Ill-defined hypoenhancing pancreatic head mass measuring 3.0 x 2.6 cm today. 4. New 8 mm hypodensity in the inferior right liver adjacent to the gallbladder. Attention on follow-up recommended. 5. Upper abdominal lymphadenopathy shows no substantial change. 6.  Aortic Atherosclerois (ICD10-170.0) Electronically Signed   By: EMisty StanleyM.D.   On: 02/15/2019 10:04   [place] Img Lower Bilateral  Result Date: 04/05/2019 CLINICAL DATA:  Acute bilateral lower extremity swelling. EXAM: BILATERAL LOWER EXTREMITY VENOUS DOPPLER ULTRASOUND TECHNIQUE: Gray-scale sonography with graded compression, as well as color Doppler and  duplex ultrasound were performed to evaluate the lower extremity deep venous systems from the level of the common femoral vein and including the common femoral, femoral, profunda femoral, popliteal and calf veins including the posterior tibial, peroneal and gastrocnemius veins when visible. The superficial great saphenous vein was also interrogated. Spectral Doppler was utilized to evaluate flow at rest and with distal augmentation maneuvers in the common femoral, femoral and popliteal veins. COMPARISON:  None. FINDINGS:  RIGHT LOWER EXTREMITY Common Femoral Vein: No evidence of thrombus. Normal compressibility, respiratory phasicity and response to augmentation. Saphenofemoral Junction: No evidence of thrombus. Normal compressibility and flow on color Doppler imaging. Profunda Femoral Vein: No evidence of thrombus. Normal compressibility and flow on color Doppler imaging. Femoral Vein: No evidence of thrombus. Normal compressibility, respiratory phasicity and response to augmentation. Popliteal Vein: No evidence of thrombus. Normal compressibility, respiratory phasicity and response to augmentation. Calf Veins: No evidence of thrombus. Normal compressibility and flow on color Doppler imaging. Venous Reflux:  None. Other Findings: Probable 3.7 cm Baker's cyst is noted in popliteal fossa. LEFT LOWER EXTREMITY Common Femoral Vein: No evidence of thrombus. Normal compressibility, respiratory phasicity and response to augmentation. Saphenofemoral Junction: No evidence of thrombus. Normal compressibility and flow on color Doppler imaging. Profunda Femoral Vein: No evidence of thrombus. Normal compressibility and flow on color Doppler imaging. Femoral Vein: No evidence of thrombus. Normal compressibility, respiratory phasicity and response to augmentation. Popliteal Vein: No evidence of thrombus. Normal compressibility, respiratory phasicity and response to augmentation. Calf Veins: No evidence of thrombus. Normal  compressibility and flow on color Doppler imaging. Venous Reflux:  None. Other Findings: Probable 1.3 cm Baker's cyst seen in left popliteal fossa. IMPRESSION: No evidence of deep venous thrombosis in either lower extremity. Electronically Signed   By: Marijo Conception M.D.   On: 04/05/2019 15:21    [facility] labs CA 19-9 was check on 10/21/2018, level was elevated at 1500.    ASSESSMENT & PLAN:  1. Malignant neoplasm of head of pancreas ([place])   2. Hypokalemia   3. Diarrhea, unspecified type   4. Hypomagnesemia   5. Goals of care, counseling/discussion   . # Stage IV pancreatic cancer-7Labs are reviewed and discussed with patient CA19.9 1500--> 2101-->1614-->  897-->940--> 1972-->3374--> 6032-> 5085--> 4365 Status post 2 cycles of second line treatment with 5-FU and liposomal irinotecan.   #Diarrhea secondary to chemotherapy/recent radiation.   Refractory to Imodium and Lomotil treatments.  Octreotide injections helped however she is not able to get outpatient supply for octreotide.  I am checking undertaken toxicity- [id number] mutation. This is also possible that she may have is secondary to radiation. C. difficile has been checked which was negative.  #Hypokalemia, potassium is 2.4 today secondary to diarrhea. Patient has been taking oral potassium supplementation 20 mEq daily. Hypomagnesium, magnesium was 1.6 today.  Likely secondary to diarrhea.  #Goals of care was discussed.  Treatment is with palliative intent.  Her second line chemotherapy seems to reduce her tumor marker however she did not tolerate well.  We discussed about options of omitting further Irinotecan treatments, proceed with monotherapy with 5-FU versus switch to immunotherapy in the future.  Patient also discussed with me about options of hospice.  She will consider about her future treatment plans once her acute issue resolves. #Hereditary hemochromatosis.  Outpatient management.  I recommend patient to go to  emergency room for further evaluation, be admitted for hypokalemia, refractory diarrhea, dehydration. Recommend supportive care with continuous IV fluid, GI consultation, octreotide, electrolyte replete.  I called the emergency room triage nurse and provided above information.  Follow-up to be determined. Earlie Server, MD, PhD Hematology Oncology [place] [facility] 05/02/2019

## 2019-05-02 NOTE — ED Provider Notes (Signed)
[facility] Emergency Department Provider Note  ____________________________________________  Time seen: Approximately 3:03 PM  I have reviewed the triage vital signs and the nursing notes.   HISTORY  Chief Complaint Diarrhea and Abnormal Lab    HPI Melissa [name] is a 79 y.o. female with a history of pancreatic cancer hypertension stroke who was sent to the ED today  due to refractory diarrhea nausea and poor oral intake for the past week after chemotherapy for pancreatic cancer.  She has tried outpatient therapy with oral antimotility agents without any relief.  Patient reports that she has been taking potassium supplements but the pills come out in her bowel movements intact.  Sent to the ED by Dr.Yu from oncology for hospitalization for rehydration and correction of electrolyte abnormalities.  Reviewed labs from earlier today which showed hypokalemia with a potassium of 2.4 and hypomagnesemia of 1.6.  No chest pain shortness of breath fever chills cough or body aches.     Past Medical History:  Diagnosis Date  . Cancer [facility])    pancreatic   . Glaucoma   . Hypertension   . Osteopenia   . Stroke [facility]      Patient Active Problem List   Diagnosis Date Noted  . Hypomagnesemia 05/02/2019  . Severe protein-calorie malnutrition ([place]) 04/29/2019  . Diarrhea 04/29/2019  . Genetic testing 04/26/2019  . Fitting and adjustment of gastrointestinal appliance and device   . Obstruction of bile duct   . Encounter for antineoplastic chemotherapy 12/28/2018  . Weight loss 12/28/2018  . Normocytic anemia 12/21/2018  . Hypokalemia 11/30/2018  . Glaucoma (increased eye pressure) 11/29/2018  . Hyperglycemia 11/29/2018  . Hyperlipidemia 11/29/2018  . Hypertension 11/29/2018  . Osteopenia 11/29/2018  . Subclinical hyperthyroidism 11/29/2018  . Goals of care, counseling/discussion 11/16/2018  . Malignant neoplasm of head of pancreas ([place]) 11/02/2018  .  Obstructive jaundice due to malignant neoplasm ([place]) 10/29/2018  . Pancreatic tumor   . Obstructive jaundice 10/21/2018  . 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 History of fracture of left hip 02/06/2016  . Closed left hip fracture ([place]) 01/01/2016  . History of CVA (cerebrovascular accident) 07/26/2015     Past Surgical History:  Procedure Laterality Date  . BREAST BIOPSY Right    core- stereo- neg  . ERCP N/A 10/29/2018   Procedure: ENDOSCOPIC RETROGRADE CHOLANGIOPANCREATOGRAPHY (ERCP);  Surgeon: [name], MD;  Location: [facility] ENDOSCOPY;  Service: Endoscopy;  Laterality: N/A;  . ERCP N/A 01/28/2019   Procedure: ENDOSCOPIC RETROGRADE CHOLANGIOPANCREATOGRAPHY (ERCP) STENT REMOVAL;  Surgeon: [name], MD;  Location: [facility] ENDOSCOPY;  Service: Endoscopy;  Laterality: N/A;  . EUS N/A 11/18/2018   Procedure: FULL UPPER ENDOSCOPIC ULTRASOUND (EUS) RADIAL;  Surgeon: Holly Bodily, MD;  Location: [facility] ENDOSCOPY;  Service: Gastroenterology;  Laterality: N/A;  . HIP ARTHROPLASTY Left 01/02/2016   Procedure: ARTHROPLASTY BIPOLAR HIP (HEMIARTHROPLASTY);  Surgeon: Dereck Leep, MD;  Location: [facility] ORS;  Service: Orthopedics;  Laterality: Left;  . JOINT REPLACEMENT Left    THR  . PORTACATH PLACEMENT Right 11/26/2018   Procedure: INSERTION PORT-A-CATH;  Surgeon: Jules Husbands, MD;  Location: [facility] ORS;  Service: General;  Laterality: Right;  . TONSILLECTOMY       Prior to Admission medications   Medication Sig Start Date End Date Taking? Authorizing Provider  acetaminophen (TYLENOL) 500 MG tablet Take 500 mg by mouth every 6 (six) hours as needed.    [provider]  amLODipine (NORVASC) 10 MG tablet Take 10  mg by mouth at bedtime.     [provider]  dexamethasone (DECADRON) 4 MG tablet Take 2 tablets (8 mg total) by mouth daily. Start the day after irinotecan chemotherapy for 2 days. 03/31/19   Earlie Server, MD  diphenoxylate-atropine (LOMOTIL) 2.5-0.025 MG  tablet Take 1-2 tablets by mouth 4 (four) times daily as needed for diarrhea or loose stools. 04/26/19   Earlie Server, MD  gabapentin (NEURONTIN) 300 MG capsule Take 1 capsule (300 mg total) by mouth 2 (two) times daily. 03/22/19   Earlie Server, MD  lidocaine-prilocaine (EMLA) cream Apply to affected area once Patient not taking: Reported on 05/02/2019 03/31/19   Earlie Server, MD  loperamide (IMODIUM A-D) 2 MG tablet Take 1 tablet (2 mg total) by mouth See admin instructions. Take 2 at diarrhea onset , then 1 every 2hr until 12hrs with no BM. May take 2 every 4hrs at night. If diarrhea recurs repeat. Patient not taking: Reported on 04/29/2019 03/31/19   Earlie Server, MD  magic mouthwash w/lidocaine SOLN Take 5 mLs by mouth 4 (four) times daily as needed for mouth pain. Sig: Swish/Swallow 5-10 ml four times a day as needed. Dispense 480 ml. 1RF 04/19/19   Earlie Server, MD  nystatin (MYCOSTATIN) 100000 UNIT/ML suspension Take 5 mLs (500,000 Units total) by mouth 4 (four) times daily. 04/26/19   Earlie Server, MD  octreotide (SANDOSTATIN) 100 MCG/ML SOLN injection Inject 1 mL (100 mcg total) into the skin in the morning, at noon, and at bedtime. Patient not taking: Reported on 05/02/2019 04/29/19   Earlie Server, MD  ondansetron (ZOFRAN) 8 MG tablet Take 1 tablet by mouth as needed. 11/24/18   [provider]  oxyCODONE (ROXICODONE) 5 MG immediate release tablet Take 1 tablet (5 mg total) by mouth every 6 (six) hours as needed for moderate pain or severe pain. 03/15/19   Earlie Server, MD  potassium chloride SA (KLOR-CON) 20 MEQ tablet Take 1 tablet (20 mEq total) by mouth daily. 04/29/19   Earlie Server, MD  prochlorperazine (COMPAZINE) 10 MG tablet Take 1 tablet (10 mg total) by mouth every 6 (six) hours as needed (NAUSEA). Patient not taking: Reported on 04/29/2019 03/31/19   Earlie Server, MD  valACYclovir (VALTREX) 500 MG tablet Take 1 tablet (500 mg total) by mouth 2 (two) times daily. 04/19/19   Earlie Server, MD     Allergies Patient has no known  allergies.   Family History  Problem Relation Age of Onset  . Kidney disease Father   . Breast cancer Neg Hx     Social History Social History   Tobacco Use  . Smoking status: Never Smoker  . Smokeless tobacco: Never Used  Substance Use Topics  . Alcohol use: No  . Drug use: Never    Review of Systems  Constitutional:   No fever or chills.  ENT:   No sore throat. No rhinorrhea. Cardiovascular:   No chest pain or syncope. Respiratory:   No dyspnea or cough. Gastrointestinal:   Positive generalized abdominal pain and diarrhea. Musculoskeletal:   Negative for focal pain or swelling All other systems reviewed and are negative except as documented above in ROS and HPI.  ____________________________________________   PHYSICAL EXAM:  VITAL SIGNS: ED Triage Vitals  Enc Vitals Group     BP 05/02/19 1052 128/66     Pulse Rate 05/02/19 1052 87     Resp 05/02/19 1052 16     Temp 05/02/19 1055 98.3 F (36.8 C)  Temp Source 05/02/19 1052 Oral     SpO2 05/02/19 1052 99 %     Weight 05/02/19 1052 124 lb (56.2 kg)     Height 05/02/19 1052 5\' 2"  (1.575 m)     Head Circumference --      Peak Flow --      Pain Score 05/02/19 1052 0     Pain Loc --      Pain Edu? --      Excl. in [place]? --     Vital signs reviewed, nursing assessments reviewed.   Constitutional:   Alert and oriented.  Ill-appearing Eyes:   Conjunctivae are normal. EOMI. PERRL. ENT      Head:   Normocephalic and atraumatic.      Nose:   Wearing a mask.      Mouth/Throat:   Dry mucous membranes      Neck:   No meningismus. Full ROM. Hematological/Lymphatic/Immunilogical:   No cervical lymphadenopathy. Cardiovascular:   RRR. Symmetric bilateral radial and DP pulses.  No murmurs. Cap refill less than 2 seconds. Respiratory:   Normal respiratory effort without tachypnea/retractions. Breath sounds are clear and equal bilaterally. No wheezes/rales/rhonchi. Gastrointestinal:   Soft with mild epigastric  tenderness.  Non distended. There is no CVA tenderness.  No rebound, rigidity, or guarding. Musculoskeletal:   Normal range of motion in all extremities. No joint effusions.  No lower extremity tenderness.  No edema. Neurologic:   Normal speech and language.  Motor grossly intact. No acute focal neurologic deficits are appreciated.  Skin:    Skin is warm, dry and intact. No rash noted.  No petechiae, purpura, or bullae.  ____________________________________________    LABS (pertinent positives/negatives) (all labs ordered are listed, but only abnormal results are displayed) Labs Reviewed  SARS CORONAVIRUS 2 (TAT 6-24 HRS)   ____________________________________________   EKG    ____________________________________________    RADIOLOGY  No results found.  ____________________________________________   PROCEDURES Procedures  ____________________________________________    CLINICAL IMPRESSION / ASSESSMENT AND PLAN / ED COURSE  Medications ordered in the ED: Medications  sodium chloride 0.9 % bolus 1,000 mL (has no administration in time range)  ondansetron (ZOFRAN) injection 4 mg (has no administration in time range)  potassium chloride (KLOR-CON) packet 40 mEq (has no administration in time range)  magnesium sulfate IVPB 2 g 50 mL (has no administration in time range)  potassium chloride 10 mEq in 100 mL IVPB (has no administration in time range)    Pertinent labs & imaging results that were available during my care of the patient were reviewed by me and considered in my medical decision making (see chart for details).  Melissa [name] was evaluated in Emergency Department on 05/02/2019 for the symptoms described in the history of present illness. She was evaluated in the context of the global COVID-19 pandemic, which necessitated consideration that the patient might be at risk for infection with the SARS-CoV-2 virus that causes COVID-19. Institutional protocols  and algorithms that pertain to the evaluation of patients at risk for COVID-19 are in a state of rapid change based on information released by regulatory bodies including the CDC and federal and state organizations. These policies and algorithms were followed during the patient's care in the ED.   Patient presents with dehydration, low potassium, low magnesium as diagnosed by her doctor at the cancer center today.  Reviewed Dr. Collie Siad progress note from earlier today, agree with her plan for hospitalization.  Patient is also agreeable.  We  will do Covid screening swab, ordered IV fluids, IV and oral liquid potassium, IV magnesium.  I called the patient's husband at her request to provide an update.  Dr. Tasia Catchings also recommends consultation with GI and possible use of IV octreotide for diarrhea control, which I will defer to hospitalist eval.      ____________________________________________   FINAL CLINICAL IMPRESSION(S) / ED DIAGNOSES    Final diagnoses:  Severe protein-calorie malnutrition ([place])  Malignant neoplasm of head of pancreas ([place])  Diarrhea due to malabsorption  Dehydration  Hypokalemia  Hypomagnesemia     ED Discharge Orders    None      Portions of this note were generated with dragon dictation software. Dictation errors may occur despite best attempts at proofreading.   Carrie Mew, MD 05/02/19 [phone]

## 2019-05-02 NOTE — H&P (Addendum)
History and Physical    ENGLAND SCHUG [id number] DOB: 1941/01/13 DOA: 05/02/2019  Referring MD/NP/PA:   PCP: Adin Hector, MD   Patient coming from:  The patient is coming from home.  At baseline, pt is independent for most of ADL.        Chief Complaint: Diarrhea, nausea vomiting  HPI: Melissa [name] is a 79 y.o. female with medical history significant of hypertension, stroke, pancreatic cancer on chemotherapy, glaucoma, who presents with diarrhea, nausea, vomiting.  Pt states that she has has been having severe diarrhea in the past several days, which has been progressively worsening.  She also has nausea and vomited at least once.  She has a poor oral intake for the past week after chemotherapy for pancreatic cancer.  She has generalized weakness and fatigue. She has tried outpatient therapy with oral antimotility agents without any relief.  She has chronic abdominal pain which has not changed. Patient reports that she has been taking potassium supplements but the pills come out in her bowel movements intact. She was seen by by her oncologist, Dr. Tasia Catchings at office, and found to have hypokalemia with K of 4 and hypomagnesemia of 1.6. She was sent to the ED for further evaluation and treatment.  Patient does not have chest pain, shortness breath, cough.  No symptoms of UTI.  No facial droop or slurred speech.  ED Course: pt was found to have WBC 2.2, pending COVID-19 PCR, renal function okay, no fever, blood pressure 128/66, heart rate 87, RR 16, oxygen saturation 99% on room air.  Patient is placed on MedSurg bed of observation.  Review of Systems:   General: no fevers, chills, no body weight gain, has poor appetite, has fatigue HEENT: no blurry vision, hearing changes or sore throat Respiratory: no dyspnea, coughing, wheezing CV: no chest pain, no palpitations GI: has nausea, vomiting, abdominal pain, diarrhea, no constipation GU: no dysuria, burning on urination, increased  urinary frequency, hematuria  Ext: no leg edema Neuro: no unilateral weakness, numbness, or tingling, no vision change or hearing loss Skin: no rash, no skin tear. MSK: No muscle spasm, no deformity, no limitation of range of movement in spin Heme: No easy bruising.  Travel history: No recent long distant travel.  Allergy: No Known Allergies  Past Medical History:  Diagnosis Date  . Cancer [facility])    pancreatic   . Glaucoma   . Hypertension   . Osteopenia   . Stroke [facility])     Past Surgical History:  Procedure Laterality Date  . BREAST BIOPSY Right    core- stereo- neg  . ERCP N/A 10/29/2018   Procedure: ENDOSCOPIC RETROGRADE CHOLANGIOPANCREATOGRAPHY (ERCP);  Surgeon: [name], MD;  Location: [facility] ENDOSCOPY;  Service: Endoscopy;  Laterality: N/A;  . ERCP N/A 01/28/2019   Procedure: ENDOSCOPIC RETROGRADE CHOLANGIOPANCREATOGRAPHY (ERCP) STENT REMOVAL;  Surgeon: [name], MD;  Location: [facility] ENDOSCOPY;  Service: Endoscopy;  Laterality: N/A;  . EUS N/A 11/18/2018   Procedure: FULL UPPER ENDOSCOPIC ULTRASOUND (EUS) RADIAL;  Surgeon: Holly Bodily, MD;  Location: [facility] ENDOSCOPY;  Service: Gastroenterology;  Laterality: N/A;  . HIP ARTHROPLASTY Left 01/02/2016   Procedure: ARTHROPLASTY BIPOLAR HIP (HEMIARTHROPLASTY);  Surgeon: Dereck Leep, MD;  Location: [facility] ORS;  Service: Orthopedics;  Laterality: Left;  . JOINT REPLACEMENT Left    THR  . PORTACATH PLACEMENT Right 11/26/2018   Procedure: INSERTION PORT-A-CATH;  Surgeon: Jules Husbands, MD;  Location: [facility] ORS;  Service: General;  Laterality: Right;  .  TONSILLECTOMY      Social History:  reports that she has never smoked. She has never used smokeless tobacco. She reports that she does not drink alcohol or use drugs.  Family History:  Family History  Problem Relation Age of Onset  . Kidney disease Father   . Breast cancer Neg Hx      Prior to Admission medications   Medication Sig Start Date End Date Taking?  Authorizing Provider  acetaminophen (TYLENOL) 500 MG tablet Take 500 mg by mouth every 6 (six) hours as needed.    [provider]  amLODipine (NORVASC) 10 MG tablet Take 10 mg by mouth at bedtime.     [provider]  dexamethasone (DECADRON) 4 MG tablet Take 2 tablets (8 mg total) by mouth daily. Start the day after irinotecan chemotherapy for 2 days. 03/31/19   Earlie Server, MD  diphenoxylate-atropine (LOMOTIL) 2.5-0.025 MG tablet Take 1-2 tablets by mouth 4 (four) times daily as needed for diarrhea or loose stools. 04/26/19   Earlie Server, MD  gabapentin (NEURONTIN) 300 MG capsule Take 1 capsule (300 mg total) by mouth 2 (two) times daily. 03/22/19   Earlie Server, MD  lidocaine-prilocaine (EMLA) cream Apply to affected area once Patient not taking: Reported on 05/02/2019 03/31/19   Earlie Server, MD  loperamide (IMODIUM A-D) 2 MG tablet Take 1 tablet (2 mg total) by mouth See admin instructions. Take 2 at diarrhea onset , then 1 every 2hr until 12hrs with no BM. May take 2 every 4hrs at night. If diarrhea recurs repeat. Patient not taking: Reported on 04/29/2019 03/31/19   Earlie Server, MD  magic mouthwash w/lidocaine SOLN Take 5 mLs by mouth 4 (four) times daily as needed for mouth pain. Sig: Swish/Swallow 5-10 ml four times a day as needed. Dispense 480 ml. 1RF 04/19/19   Earlie Server, MD  nystatin (MYCOSTATIN) 100000 UNIT/ML suspension Take 5 mLs (500,000 Units total) by mouth 4 (four) times daily. 04/26/19   Earlie Server, MD  octreotide (SANDOSTATIN) 100 MCG/ML SOLN injection Inject 1 mL (100 mcg total) into the skin in the morning, at noon, and at bedtime. Patient not taking: Reported on 05/02/2019 04/29/19   Earlie Server, MD  ondansetron (ZOFRAN) 8 MG tablet Take 1 tablet by mouth as needed. 11/24/18   [provider]  oxyCODONE (ROXICODONE) 5 MG immediate release tablet Take 1 tablet (5 mg total) by mouth every 6 (six) hours as needed for moderate pain or severe pain. 03/15/19   Earlie Server, MD  potassium chloride  SA (KLOR-CON) 20 MEQ tablet Take 1 tablet (20 mEq total) by mouth daily. 04/29/19   Earlie Server, MD  prochlorperazine (COMPAZINE) 10 MG tablet Take 1 tablet (10 mg total) by mouth every 6 (six) hours as needed (NAUSEA). Patient not taking: Reported on 04/29/2019 03/31/19   Earlie Server, MD  valACYclovir (VALTREX) 500 MG tablet Take 1 tablet (500 mg total) by mouth 2 (two) times daily. 04/19/19   Earlie Server, MD    Physical Exam: Vitals:   05/02/19 1715 05/02/19 1717 05/02/19 1718 05/02/19 1719  BP:  (!) 146/68    Pulse: 92 91 88 88  Resp: 19 19 20 17   Temp:      TempSrc:      SpO2: 100% 100% 100% 100%  Weight:      Height:       General: Not in acute distress.  Dry mucus and membrane HEENT:       Eyes: PERRL, EOMI, no  scleral icterus.       ENT: No discharge from the ears and nose, no pharynx injection, no tonsillar enlargement.        Neck: No JVD, no bruit, no mass felt. Heme: No neck lymph node enlargement. Cardiac: S1/S2, RRR, No murmurs, No gallops or rubs. Respiratory: No rales, wheezing, rhonchi or rubs. GI: Soft, nondistended, has mild tenderness diffusely, no rebound pain, no organomegaly, BS present. GU: No hematuria Ext: No pitting leg edema bilaterally. 2+DP/PT pulse bilaterally. Musculoskeletal: No joint deformities, No joint redness or warmth, no limitation of ROM in spin. Skin: No rashes.  Neuro: Alert, oriented X3, cranial nerves II-XII grossly intact, moves all extremities normally.  Labs on Admission: I have personally reviewed following labs and imaging studies  CBC: Recent Labs  Lab 04/26/19 1305 05/02/19 0917  WBC 3.3* 2.2*  NEUTROABS 2.4 1.3*  HGB 10.4* 10.8*  HCT 31.0* 32.5*  MCV 102.6* 102.2*  PLT 149* [id number]   Basic Metabolic Panel: Recent Labs  Lab 04/26/19 1305 04/29/19 0940 05/02/19 0917  NA 133* 134* 139  K 3.1* 2.8* 2.4*  CL 102 102 105  CO2 21* 21* 20*  GLUCOSE 141* 141* 132*  BUN 14 12 8   CREATININE 0.60 0.61 0.63  CALCIUM 7.9* 7.8* 7.8*  MG   --  1.7 1.6*   GFR: Estimated Creatinine Clearance: 46.6 mL/min (by C-G formula based on SCr of 0.63 mg/dL). Liver Function Tests: Recent Labs  Lab 04/26/19 1305 05/02/19 0917  AST 23 17  ALT 17 13  ALKPHOS 266* 241*  BILITOT 0.7 0.8  PROT 6.3* 6.1*  ALBUMIN 2.8* 2.8*   No results for input(s): LIPASE, AMYLASE in the last 168 hours. No results for input(s): AMMONIA in the last 168 hours. Coagulation Profile: No results for input(s): INR, PROTIME in the last 168 hours. Cardiac Enzymes: No results for input(s): CKTOTAL, CKMB, CKMBINDEX, TROPONINI in the last 168 hours. BNP (last 3 results) No results for input(s): PROBNP in the last 8760 hours. HbA1C: No results for input(s): HGBA1C in the last 72 hours. CBG: No results for input(s): GLUCAP in the last 168 hours. Lipid Profile: No results for input(s): CHOL, HDL, LDLCALC, TRIG, CHOLHDL, LDLDIRECT in the last 72 hours. Thyroid Function Tests: No results for input(s): TSH, T4TOTAL, FREET4, T3FREE, THYROIDAB in the last 72 hours. Anemia Panel: No results for input(s): VITAMINB12, FOLATE, FERRITIN, TIBC, IRON, RETICCTPCT in the last 72 hours. Urine analysis: No results found for: COLORURINE, APPEARANCEUR, LABSPEC, [place], GLUCOSEU, HGBUR, BILIRUBINUR, KETONESUR, PROTEINUR, UROBILINOGEN, NITRITE, LEUKOCYTESUR Sepsis Labs: @LABRCNTIP (procalcitonin:4,lacticidven:4) ) Recent Results (from the past 240 hour(s))  C difficile quick screen w PCR reflex     Status: None   Collection Time: 04/29/19  1:30 PM   Specimen: STOOL  Result Value Ref Range Status   C Diff antigen NEGATIVE NEGATIVE Final   C Diff toxin NEGATIVE NEGATIVE Final   C Diff interpretation No C. difficile detected.  Final    Comment: Performed at [facility], [address], [place], [place] [address]     Radiological Exams on Admission: No results found.   EKG:  Not done in ED  Assessment/Plan Principal Problem:   Nausea vomiting and  diarrhea Active Problems:   Malignant neoplasm of head of pancreas (HCC)   History of CVA (cerebrovascular accident)   Hypertension   Hypokalemia   Hypomagnesemia   Dehydration   Nausea vomiting and diarrhea: Likely due to pancreatic cancer.  Patient had a negative C. difficile PCR  on 2/12.  Will not repeat it today -Placed on MedSurg bed for observation -IV fluid: 1 L normal saline, followed by 75 cc/h. -As needed Zofran for nausea - started octreotide 150 mg 3 times daily per Dr. Tasia Catchings -continue GI, Dr. Vicente Males and Oncology, Dr. Tasia Catchings  Hypokalemia and hypomagnesemia: K= 2.4 and Mg 1.6 on admission. - Repleted both -check phosphorus level  Malignant neoplasm of head of pancreas [facility]): on chemotherapy -Dr. Tasia Catchings of oncology is consulted  History of CVA (cerebrovascular accident): -not taking meds now. No acute issues  HTN:  -hold amlodipine due to dehydration and normal blood pressure (128/66) -hydralazine prn  Dehydration: -IVF as above       DVT ppx: SQ Lovenox Code Status: DNR (I discussed with patient and explained the meaning of CODE STATUS. Patient wants sto be DNR) Family Communication:  Yes, patient's daughter by phone Disposition Plan:  Anticipate discharge back to previous home environment Consults called:  Dr. Tasia Catchings of oncology and Dr. Vicente Males of GI Admission status: Med-surg bed for obs   Date of Service 05/02/2019    [place] [facility]   If 7PM-7AM, please contact night-coverage [email or web address] 05/02/2019, 7:27 PM

## 2019-05-02 NOTE — ED Triage Notes (Addendum)
Pt sent from the [facility] for K+2.4 with continued diarrhea and nausea since having a chemo treatment 2/10. Pt is very weak, states anytime she takes a spoon of ensure she has 4 episodes of diarrhea soon after. Dr. Dennis Bast ask to be called at [phone]. Pt arrives with porta cath accessed.

## 2019-05-03 ENCOUNTER — Inpatient Hospital Stay: Payer: Medicare Other | Admitting: Oncology

## 2019-05-03 ENCOUNTER — Inpatient Hospital Stay: Payer: Medicare Other

## 2019-05-03 DIAGNOSIS — Z66 Do not resuscitate: Secondary | ICD-10-CM | POA: Diagnosis present

## 2019-05-03 DIAGNOSIS — R197 Diarrhea, unspecified: Secondary | ICD-10-CM

## 2019-05-03 DIAGNOSIS — R112 Nausea with vomiting, unspecified: Secondary | ICD-10-CM | POA: Diagnosis not present

## 2019-05-03 DIAGNOSIS — I10 Essential (primary) hypertension: Secondary | ICD-10-CM | POA: Diagnosis present

## 2019-05-03 DIAGNOSIS — K909 Intestinal malabsorption, unspecified: Secondary | ICD-10-CM | POA: Diagnosis present

## 2019-05-03 DIAGNOSIS — Z20822 Contact with and (suspected) exposure to covid-19: Secondary | ICD-10-CM | POA: Diagnosis present

## 2019-05-03 DIAGNOSIS — K1230 Oral mucositis (ulcerative), unspecified: Secondary | ICD-10-CM | POA: Diagnosis present

## 2019-05-03 DIAGNOSIS — K520 Gastroenteritis and colitis due to radiation: Secondary | ICD-10-CM | POA: Diagnosis present

## 2019-05-03 DIAGNOSIS — Z6823 Body mass index (BMI) 23.0-23.9, adult: Secondary | ICD-10-CM | POA: Diagnosis not present

## 2019-05-03 DIAGNOSIS — C250 Malignant neoplasm of head of pancreas: Secondary | ICD-10-CM | POA: Diagnosis present

## 2019-05-03 DIAGNOSIS — Z515 Encounter for palliative care: Secondary | ICD-10-CM | POA: Diagnosis not present

## 2019-05-03 DIAGNOSIS — Z96642 Presence of left artificial hip joint: Secondary | ICD-10-CM | POA: Diagnosis present

## 2019-05-03 DIAGNOSIS — T451X5A Adverse effect of antineoplastic and immunosuppressive drugs, initial encounter: Secondary | ICD-10-CM | POA: Diagnosis present

## 2019-05-03 DIAGNOSIS — E860 Dehydration: Secondary | ICD-10-CM | POA: Diagnosis present

## 2019-05-03 DIAGNOSIS — Z8673 Personal history of transient ischemic attack (TIA), and cerebral infarction without residual deficits: Secondary | ICD-10-CM | POA: Diagnosis not present

## 2019-05-03 DIAGNOSIS — K521 Toxic gastroenteritis and colitis: Secondary | ICD-10-CM | POA: Diagnosis present

## 2019-05-03 DIAGNOSIS — H409 Unspecified glaucoma: Secondary | ICD-10-CM | POA: Diagnosis present

## 2019-05-03 DIAGNOSIS — C259 Malignant neoplasm of pancreas, unspecified: Secondary | ICD-10-CM | POA: Diagnosis not present

## 2019-05-03 DIAGNOSIS — Y842 Radiological procedure and radiotherapy as the cause of abnormal reaction of the patient, or of later complication, without mention of misadventure at the time of the procedure: Secondary | ICD-10-CM | POA: Diagnosis present

## 2019-05-03 DIAGNOSIS — R109 Unspecified abdominal pain: Secondary | ICD-10-CM | POA: Diagnosis present

## 2019-05-03 DIAGNOSIS — E876 Hypokalemia: Secondary | ICD-10-CM

## 2019-05-03 DIAGNOSIS — M8580 Other specified disorders of bone density and structure, unspecified site: Secondary | ICD-10-CM | POA: Diagnosis present

## 2019-05-03 DIAGNOSIS — E43 Unspecified severe protein-calorie malnutrition: Secondary | ICD-10-CM | POA: Diagnosis present

## 2019-05-03 DIAGNOSIS — Z79899 Other long term (current) drug therapy: Secondary | ICD-10-CM | POA: Diagnosis not present

## 2019-05-03 DIAGNOSIS — R140 Abdominal distension (gaseous): Secondary | ICD-10-CM | POA: Diagnosis present

## 2019-05-03 LAB — BASIC METABOLIC PANEL
Anion gap: 12 (ref 5–15)
BUN: 8 mg/dL (ref 8–23)
CO2: 20 mmol/L — ABNORMAL LOW (ref 22–32)
Calcium: 7.3 mg/dL — ABNORMAL LOW (ref 8.9–10.3)
Chloride: 109 mmol/L (ref 98–111)
Creatinine, Ser: 0.55 mg/dL (ref 0.44–1.00)
GFR calc Af Amer: >60 mL/min (ref >60)
GFR calc non Af Amer: >60 mL/min (ref >60)
Glucose, Bld: 83 mg/dL (ref 70–99)
Potassium: 2.6 mmol/L — CL (ref 3.5–5.1)
Sodium: 141 mmol/L (ref 135–145)

## 2019-05-03 LAB — CBC
HCT: 29.9 % — ABNORMAL LOW (ref 36.0–46.0)
Hemoglobin: 10.2 g/dL — ABNORMAL LOW (ref 12.0–15.0)
MCH: 34.6 pg — ABNORMAL HIGH (ref 26.0–34.0)
MCHC: 34.1 g/dL (ref 30.0–36.0)
MCV: 101.4 fL — ABNORMAL HIGH (ref 80.0–100.0)
Platelets: 267 10*3/uL (ref 150–400)
RBC: 2.95 MIL/uL — ABNORMAL LOW (ref 3.87–5.11)
RDW: 16.2 % — ABNORMAL HIGH (ref 11.5–15.5)
WBC: 2.9 10*3/uL — ABNORMAL LOW (ref 4.0–10.5)
nRBC: 0 % (ref 0.0–0.2)

## 2019-05-03 LAB — PHOSPHORUS: Phosphorus: 1.9 mg/dL — ABNORMAL LOW (ref 2.5–4.6)

## 2019-05-03 LAB — POTASSIUM: Potassium: 3.3 mmol/L — ABNORMAL LOW (ref 3.5–5.1)

## 2019-05-03 LAB — PANCREATIC ELASTASE, FECAL

## 2019-05-03 LAB — SARS CORONAVIRUS 2 (TAT 6-24 HRS): SARS Coronavirus 2: NEGATIVE

## 2019-05-03 LAB — MAGNESIUM: Magnesium: 2.6 mg/dL — ABNORMAL HIGH (ref 1.7–2.4)

## 2019-05-03 MED ORDER — POTASSIUM CHLORIDE CRYS ER 20 MEQ PO TBCR
40.0000 meq | EXTENDED_RELEASE_TABLET | ORAL | Status: AC
  Administered 2019-05-03 (×3): 40 meq via ORAL
  Filled 2019-05-03 (×3): qty 2

## 2019-05-03 MED ORDER — PANCRELIPASE (LIP-PROT-AMYL) 12000-38000 UNITS PO CPEP
72000.0000 [IU] | ORAL_CAPSULE | Freq: Three times a day (TID) | ORAL | Status: DC
  Administered 2019-05-03 – 2019-05-07 (×8): 72000 [IU] via ORAL
  Filled 2019-05-03 (×17): qty 6

## 2019-05-03 MED ORDER — K PHOS MONO-SOD PHOS DI & MONO 155-852-130 MG PO TABS
500.0000 mg | ORAL_TABLET | Freq: Three times a day (TID) | ORAL | Status: AC
  Administered 2019-05-03 – 2019-05-04 (×3): 500 mg via ORAL
  Filled 2019-05-03 (×4): qty 2

## 2019-05-03 MED ORDER — DICYCLOMINE HCL 10 MG PO CAPS
10.0000 mg | ORAL_CAPSULE | Freq: Three times a day (TID) | ORAL | Status: DC
  Administered 2019-05-03 – 2019-05-08 (×16): 10 mg via ORAL
  Filled 2019-05-03 (×24): qty 1

## 2019-05-03 NOTE — TOC Initial Note (Signed)
Transition of Care [facility]) - Initial/Assessment Note    Patient Details  Name: Melissa [name] MRN: [id number] Date of Birth: [birth year]  Transition of Care [facility]) CM/SW Contact:    Elease Hashimoto, LCSW Phone Number: 05/03/2019, 9:41 AM  Clinical Narrative:  Met with pt to discuss discharge needs. 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 They live next door to pt and husband. Pt reports she is independent at home and was given a stronger chemo treatment which she did not do well with. Discussed if needing any equipment she feels she needs a rw due to her mom's is old. Will ask MD for order for rw so can get before going home. Pt has a lot on her plate and feels when she feels well she can manage. Will continue to work on any discharge needs and provide support while here.               Expected Discharge Plan: Home/Self Care Barriers to Discharge: Continued Medical Work up   Patient Goals and CMS Choice Patient states their goals for this hospitalization and ongoing recovery are:: I hope to feel better than will be ready to go home      Expected Discharge Plan and Services Expected Discharge Plan: Home/Self Care In-house Referral: Clinical Social Work     Living arrangements for the past 2 months: Single Family Home                                      Prior Living Arrangements/Services Living arrangements for the past 2 months: Single Family Home Lives with:: Spouse          Need for Family Participation in Patient Care: No (Comment) Care giver support system in place?: No (comment) Current home services: DME(cane and Mom's old rw)    Activities of Daily Living Home Assistive Devices/Equipment: Environmental consultant (specify type) ADL Screening (condition at time of admission) Patient's cognitive ability adequate to safely complete daily activities?: Yes Is the patient deaf or have difficulty  hearing?: No Does the patient have difficulty seeing, even when wearing glasses/contacts?: No Does the patient have difficulty concentrating, remembering, or making decisions?: No Patient able to express need for assistance with ADLs?: No Does the patient have difficulty dressing or bathing?: No Independently performs ADLs?: Yes (appropriate for developmental age) Does the patient have difficulty walking or climbing stairs?: No Weakness of Legs: None Weakness of Arms/Hands: None  Permission Sought/Granted                  Emotional Assessment Appearance:: Appears stated age Attitude/Demeanor/Rapport: Gracious, Engaged Affect (typically observed): Adaptable, Accepting Orientation: : Oriented to Self, Oriented to Place, Oriented to  Time, Oriented to Situation      Admission diagnosis:  Malignant neoplasm of head of pancreas (HCC) [C25.0] Dehydration [E86.0] Hypokalemia [E87.6] Hypomagnesemia [E83.42] Severe protein-calorie malnutrition (HCC) [E43] Nausea vomiting and diarrhea [R11.2, R19.7] Diarrhea due to malabsorption [K90.9, R19.7] Patient Active Problem List   Diagnosis Date Noted  . Hypomagnesemia 05/02/2019  . Nausea vomiting and diarrhea 05/02/2019  . Dehydration 05/02/2019  . Severe protein-calorie malnutrition ([place]) 04/29/2019  . Diarrhea 04/29/2019  . Genetic testing 04/26/2019  . Fitting and adjustment of gastrointestinal appliance and device   . Obstruction of bile duct   . Encounter for antineoplastic chemotherapy  12/28/2018  . Weight loss 12/28/2018  . Normocytic anemia 12/21/2018  . Hypokalemia 11/30/2018  . Glaucoma (increased eye pressure) 11/29/2018  . Hyperglycemia 11/29/2018  . Hyperlipidemia 11/29/2018  . Hypertension 11/29/2018  . Osteopenia 11/29/2018  . Subclinical hyperthyroidism 11/29/2018  . Goals of care, counseling/discussion 11/16/2018  . Malignant neoplasm of head of pancreas ([place]) 11/02/2018  . Obstructive jaundice due to malignant  neoplasm ([place]) 10/29/2018  . Pancreatic tumor   . Obstructive jaundice 10/21/2018  . 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 History of fracture of left hip 02/06/2016  . Closed left hip fracture ([place]) 01/01/2016  . History of CVA (cerebrovascular accident) 07/26/2015   PCP:  Adin Hector, MD Pharmacy:   [place], [place] - [place] [address] [place] [address] Phone: [phone] Fax: [phone]     Social Determinants of Health (SDOH) Interventions    Readmission Risk Interventions No flowsheet data found.

## 2019-05-03 NOTE — Consult Note (Addendum)
Hematology/Oncology Consult note [facility] Telephone:([phone] [phone]  Patient Care Team: Adin Hector, MD as PCP - General (Internal Medicine) Clent Jacks, RN as Oncology Nurse Navigator   Name of the patient: Melissa [name]  [id number]  Jun 23, 1940   Date of visit: 05/03/19 REASON FOR COSULTATION:  Pancreatic cancer on chemotherapy, nausea vomiting diarrhea electrolyte disturbance History of presenting illness-  79 y.o. female with PMH listed at below who presents to ER for evaluation of refractory diarrhea, electrolyte disturbance.  She has a history of metastatic pancreatic cancer on second line treatment with 5-FU and liposomal irinotecan.  Last chemotherapy was 2 weeks ago. Patient had failed outpatient treatment with Imodium, Lomotil, IV fluid and was recommended by me to go to emergency room to get admitted for continuous IV fluid octreotide injections. She was able to get  octreotide 150 MCG x1 at the cancer center on 04/29/2019.  I wrote her prescription of octreotide to be used at home however patient cannot afford the co-pay. Patient was admitted last night and has been started on continuous IV fluid, electrolyte replete, and octreotide 150 MCG 3 times daily. Today when I see patient, she told me that her diarrhea has started to improve.  She has a few episodes of small amount of loose stool since midnight.  She is able to drink some fluid and eat some [place] ice. Denies any abdominal pain, fever or chills.    Review of Systems  Constitutional: Positive for appetite change and fatigue. Negative for chills and fever.  HENT:   Negative for hearing loss and voice change.   Eyes: Negative for eye problems.  Respiratory: Negative for chest tightness and cough.   Cardiovascular: Negative for chest pain.  Gastrointestinal: Positive for diarrhea. Negative for abdominal distention, abdominal pain and blood in stool.  Endocrine:  Negative for hot flashes.  Genitourinary: Negative for difficulty urinating and frequency.   Musculoskeletal: Negative for arthralgias.  Skin: Negative for itching and rash.  Neurological: Negative for extremity weakness.  Hematological: Negative for adenopathy.  Psychiatric/Behavioral: Negative for confusion.    No Known Allergies  Patient Active Problem List   Diagnosis Date Noted  . Hypomagnesemia 05/02/2019  . Nausea vomiting and diarrhea 05/02/2019  . Dehydration 05/02/2019  . Severe protein-calorie malnutrition ([place]) 04/29/2019  . Diarrhea 04/29/2019  . Genetic testing 04/26/2019  . Fitting and adjustment of gastrointestinal appliance and device   . Obstruction of bile duct   . Encounter for antineoplastic chemotherapy 12/28/2018  . Weight loss 12/28/2018  . Normocytic anemia 12/21/2018  . Hypokalemia 11/30/2018  . Glaucoma (increased eye pressure) 11/29/2018  . Hyperglycemia 11/29/2018  . Hyperlipidemia 11/29/2018  . Hypertension 11/29/2018  . Osteopenia 11/29/2018  . Subclinical hyperthyroidism 11/29/2018  . Goals of care, counseling/discussion 11/16/2018  . Malignant neoplasm of head of pancreas ([place]) 11/02/2018  . Obstructive jaundice due to malignant neoplasm ([place]) 10/29/2018  . Pancreatic tumor   . Obstructive jaundice 10/21/2018  . 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 History of fracture of left hip 02/06/2016  . Closed left hip fracture ([place]) 01/01/2016  . History of CVA (cerebrovascular accident) 07/26/2015     Past Medical History:  Diagnosis Date  . Cancer [facility])    pancreatic   . Glaucoma   . Hypertension   . Osteopenia   . Stroke [facility])      Past Surgical History:  Procedure Laterality Date  . BREAST BIOPSY Right    core- stereo-  neg  . ERCP N/A 10/29/2018   Procedure: ENDOSCOPIC RETROGRADE CHOLANGIOPANCREATOGRAPHY (ERCP);  Surgeon: [name], MD;  Location: [facility] ENDOSCOPY;  Service: Endoscopy;  Laterality: N/A;  . ERCP N/A  01/28/2019   Procedure: ENDOSCOPIC RETROGRADE CHOLANGIOPANCREATOGRAPHY (ERCP) STENT REMOVAL;  Surgeon: [name], MD;  Location: [facility] ENDOSCOPY;  Service: Endoscopy;  Laterality: N/A;  . EUS N/A 11/18/2018   Procedure: FULL UPPER ENDOSCOPIC ULTRASOUND (EUS) RADIAL;  Surgeon: Holly Bodily, MD;  Location: [facility] ENDOSCOPY;  Service: Gastroenterology;  Laterality: N/A;  . HIP ARTHROPLASTY Left 01/02/2016   Procedure: ARTHROPLASTY BIPOLAR HIP (HEMIARTHROPLASTY);  Surgeon: Dereck Leep, MD;  Location: [facility] ORS;  Service: Orthopedics;  Laterality: Left;  . JOINT REPLACEMENT Left    THR  . PORTACATH PLACEMENT Right 11/26/2018   Procedure: INSERTION PORT-A-CATH;  Surgeon: Jules Husbands, MD;  Location: [facility] ORS;  Service: General;  Laterality: Right;  . TONSILLECTOMY      Social History   Socioeconomic History  . Marital status: Married    Spouse name: Not on file  . Number of children: Not on file  . Years of education: Not on file  . Highest education level: Not on file  Occupational History  . Not on file  Tobacco Use  . Smoking status: Never Smoker  . Smokeless tobacco: Never Used  Substance and Sexual Activity  . Alcohol use: No  . Drug use: Never  . Sexual activity: Not on file  Other Topics Concern  . Not on file  Social History Narrative  . Not on file   Social Determinants of Health   Financial Resource Strain:   . Difficulty of Paying Living Expenses: Not on file  Food Insecurity:   . Worried About Charity fundraiser in the Last Year: Not on file  . Ran Out of Food in the Last Year: Not on file  Transportation Needs:   . Lack of Transportation (Medical): Not on file  . Lack of Transportation (Non-Medical): Not on file  Physical Activity:   . Days of Exercise per Week: Not on file  . Minutes of Exercise per Session: Not on file  Stress:   . Feeling of Stress : Not on file  Social Connections:   . Frequency of Communication with Friends and Family: Not on  file  . Frequency of Social Gatherings with Friends and Family: Not on file  . Attends Religious Services: Not on file  . Active Member of Clubs or Organizations: Not on file  . Attends Archivist Meetings: Not on file  . Marital Status: Not on file  Intimate Partner Violence:   . Fear of Current or Ex-Partner: Not on file  . Emotionally Abused: Not on file  . Physically Abused: Not on file  . Sexually Abused: Not on file     Family History  Problem Relation Age of Onset  . Kidney disease Father   . Breast cancer Neg Hx      Current Facility-Administered Medications:  .  0.9 %  sodium chloride infusion, , Intravenous, Continuous, Ivor Costa, MD, Last Rate: 75 mL/hr at 05/03/19 1111, New Bag at 05/03/19 1111 .  acetaminophen (TYLENOL) tablet 500 mg, 500 mg, Oral, Q6H PRN, Ivor Costa, MD .  diphenoxylate-atropine (LOMOTIL) 2.5-0.025 MG per tablet 1-2 tablet, 1-2 tablet, Oral, QID PRN, Ivor Costa, MD .  enoxaparin (LOVENOX) injection 40 mg, 40 mg, Subcutaneous, Q24H, Ivor Costa, MD, 40 mg at 05/03/19 0100 .  gabapentin (NEURONTIN) capsule 300 mg, 300  mg, Oral, BID, Ivor Costa, MD, 300 mg at 05/03/19 [id number] .  hydrALAZINE (APRESOLINE) tablet 25 mg, 25 mg, Oral, TID PRN, Ivor Costa, MD .  magic mouthwash, 5 mL, Oral, QID PRN **AND** lidocaine (XYLOCAINE) 2 % viscous mouth solution 5 mL, 5 mL, Mouth/Throat, QID PRN, Hart Robinsons A, RPH .  loperamide (IMODIUM) capsule 2 mg, 2 mg, Oral, See admin instructions, Ivor Costa, MD, 2 mg at 05/03/19 1055 .  octreotide (SANDOSTATIN) injection 150 mcg, 150 mcg, Subcutaneous, TID, Ivor Costa, MD, 150 mcg at 05/03/19 0911 .  ondansetron (ZOFRAN) injection 4 mg, 4 mg, Intravenous, Once, Ivor Costa, MD .  ondansetron [facility]) injection 4 mg, 4 mg, Intravenous, Q8H PRN, Ivor Costa, MD, 4 mg at 05/02/19 2029 .  oxyCODONE (Oxy IR/ROXICODONE) immediate release tablet 5 mg, 5 mg, Oral, Q6H PRN, Ivor Costa, MD, 5 mg at 05/03/19 0138 .  phosphorus (K  PHOS NEUTRAL) tablet 500 mg, 500 mg, Oral, TID, Enzo Bi, MD .  potassium chloride SA (KLOR-CON) CR tablet 40 mEq, 40 mEq, Oral, Q4H, Enzo Bi, MD, 40 mEq at 05/03/19 0800   Physical exam:  Vitals:   05/02/19 2130 05/02/19 2200 05/02/19 2242 05/03/19 0724  BP: (!) 149/71 (!) 153/60 (!) 125/57 139/61  Pulse: 86 85 91 91  Resp: 16 17 16 17   Temp:   97.8 F (36.6 C) 97.8 F (36.6 C)  TempSrc:   Oral Oral  SpO2: 97% 98% 100% 98%  Weight:   130 lb 4.7 oz (59.1 kg)   Height:       Physical Exam  Constitutional: She is oriented to person, place, and time. No distress.  She lies in the bed without any acute distress.  HENT:  Head: Normocephalic and atraumatic.  Mouth/Throat: No oropharyngeal exudate.  Mouth sore has improved  Eyes: Pupils are equal, round, and reactive to light. EOM are normal. No scleral icterus.  Cardiovascular: Normal rate and regular rhythm.  Pulmonary/Chest: Effort normal. No respiratory distress.  Abdominal: Soft. Bowel sounds are normal. She exhibits no distension.  Musculoskeletal:        General: No edema. Normal range of motion.     Cervical back: Normal range of motion and neck supple.  Neurological: She is alert and oriented to person, place, and time.  Skin: Skin is warm and dry.  Psychiatric: Affect normal.        CMP Latest Ref Rng & Units 05/03/2019  Glucose 70 - 99 mg/dL 83  BUN 8 - 23 mg/dL 8  Creatinine 0.44 - 1.00 mg/dL 0.55  Sodium 135 - 145 mmol/L 141  Potassium 3.5 - 5.1 mmol/L 2.6(LL)  Chloride 98 - 111 mmol/L 109  CO2 22 - 32 mmol/L 20(L)  Calcium 8.9 - 10.3 mg/dL 7.3(L)  Total Protein 6.5 - 8.1 g/dL -  Total Bilirubin 0.3 - 1.2 mg/dL -  Alkaline Phos 38 - 126 U/L -  AST 15 - 41 U/L -  ALT 0 - 44 U/L -   CBC Latest Ref Rng & Units 05/03/2019  WBC 4.0 - 10.5 K/uL 2.9(L)  Hemoglobin 12.0 - 15.0 g/dL 10.2(L)  Hematocrit 36.0 - 46.0 % 29.9(L)  Platelets 150 - 400 K/uL 267   RADIOGRAPHIC STUDIES: I have personally reviewed the  radiological images as listed and agreed with the findings in the report. US Venous Img Lower Bilateral  Result Date: 04/05/2019 CLINICAL DATA:  Acute bilateral lower extremity swelling. EXAM: BILATERAL LOWER EXTREMITY VENOUS DOPPLER ULTRASOUND TECHNIQUE: Gray-scale sonography with graded  compression, as well as color Doppler and duplex ultrasound were performed to evaluate the lower extremity deep venous systems from the level of the common femoral vein and including the common femoral, femoral, profunda femoral, popliteal and calf veins including the posterior tibial, peroneal and gastrocnemius veins when visible. The superficial great saphenous vein was also interrogated. Spectral Doppler was utilized to evaluate flow at rest and with distal augmentation maneuvers in the common femoral, femoral and popliteal veins. COMPARISON:  None. FINDINGS: RIGHT LOWER EXTREMITY Common Femoral Vein: No evidence of thrombus. Normal compressibility, respiratory phasicity and response to augmentation. Saphenofemoral Junction: No evidence of thrombus. Normal compressibility and flow on color Doppler imaging. Profunda Femoral Vein: No evidence of thrombus. Normal compressibility and flow on color Doppler imaging. Femoral Vein: No evidence of thrombus. Normal compressibility, respiratory phasicity and response to augmentation. Popliteal Vein: No evidence of thrombus. Normal compressibility, respiratory phasicity and response to augmentation. Calf Veins: No evidence of thrombus. Normal compressibility and flow on color Doppler imaging. Venous Reflux:  None. Other Findings: Probable 3.7 cm Baker's cyst is noted in popliteal fossa. LEFT LOWER EXTREMITY Common Femoral Vein: No evidence of thrombus. Normal compressibility, respiratory phasicity and response to augmentation. Saphenofemoral Junction: No evidence of thrombus. Normal compressibility and flow on color Doppler imaging. Profunda Femoral Vein: No evidence of thrombus. Normal  compressibility and flow on color Doppler imaging. Femoral Vein: No evidence of thrombus. Normal compressibility, respiratory phasicity and response to augmentation. Popliteal Vein: No evidence of thrombus. Normal compressibility, respiratory phasicity and response to augmentation. Calf Veins: No evidence of thrombus. Normal compressibility and flow on color Doppler imaging. Venous Reflux:  None. Other Findings: Probable 1.3 cm Baker's cyst seen in left popliteal fossa. IMPRESSION: No evidence of deep venous thrombosis in either lower extremity. Electronically Signed   By: Marijo Conception M.D.   On: 04/05/2019 15:21    Assessment and plan- Patient is a 79 y.o. female with a history of metastatic pancreatic cancer, on second line chemotherapy with 5-FU and liposomal irinotecan, developed refractory diarrhea, failed outpatient treatment admitted for diarrhea, poor oral intake, electrolyte disturbance.  #Diarrhea, most likely secondary to irinotecan toxicity.  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  C. difficile was checked outpatient was negative. Continue continuous IV fluid, octreotide 150 MCG 3 times daily.  Lomotil, her symptoms are improving.  Appreciate GI input.  #Severe hypokalemia, potassium 2.6 today, patient is on potassium 40 mEq PO every 4 hours for 3 doses. #Hypophosphatemia, patient received K-Phos neutral tablet 500 mg 3 times daily. Hypomagnesium has resolved. Continue monitor electrolytes. #Metastatic pancreatic cancer, on second line treatment.  I have sent irinotecan toxicity profile.  Results are pending.  She most likely will not receive irinotecan given that she had severe diarrhea as side effects.  We consider monotherapy 5-FU versus switch to immunotherapy in the future.  Discussed with patient.  She agrees with the plan.   Thank you for allowing me to participate in the care of this patient.   Earlie Server, MD,  PhD Hematology Oncology [facility] at [facility] Pager- [phone] 05/03/2019

## 2019-05-03 NOTE — Plan of Care (Signed)
  Problem: Education: Goal: Knowledge of General Education information will improve Description Including pain rating scale, medication(s)/side effects and non-pharmacologic comfort measures Outcome: Progressing   Problem: Health Behavior/Discharge Planning: Goal: Ability to manage health-related needs will improve Outcome: Progressing   Problem: Clinical Measurements: Goal: Ability to maintain clinical measurements within normal limits will improve Outcome: Progressing Goal: Will remain free from infection Outcome: Progressing Goal: Diagnostic test results will improve Outcome: Progressing Goal: Respiratory complications will improve Outcome: Progressing Goal: Cardiovascular complication will be avoided Outcome: Progressing   Problem: Nutrition: Goal: Adequate nutrition will be maintained Outcome: Progressing   Problem: Skin Integrity: Goal: Risk for impaired skin integrity will decrease Outcome: Progressing   

## 2019-05-03 NOTE — TOC Progression Note (Signed)
Transition of Care [facility]) - Progression Note    Patient Details  Name: Melissa [name] MRN: [id number] Date of Birth: March 02, 1941  Transition of Care [facility]) CM/SW Contact  Linda Biehn, Gardiner Rhyme, LCSW Phone Number: 05/03/2019, 11:03 AM  Clinical Narrative:   Asked MD for rw order, she wrote one and have contacted [employer] for rw to be brought up to pt's room. Possibly going home today, have asked he bring it today. No other DC needs.    Expected Discharge Plan: Home/Self Care Barriers to Discharge: Continued Medical Work up  Expected Discharge Plan and Services Expected Discharge Plan: Home/Self Care In-house Referral: Clinical Social Work     Living arrangements for the past 2 months: Single Family Home                                       Social Determinants of Health (SDOH) Interventions    Readmission Risk Interventions No flowsheet data found.

## 2019-05-03 NOTE — Progress Notes (Signed)
PROGRESS NOTE    Melissa [name]  [id number] DOB: 1940-06-12 DOA: 05/02/2019 PCP: Adin Hector, MD    Assessment & Plan:   Principal Problem:   Nausea vomiting and diarrhea Active Problems:   Malignant neoplasm of head of pancreas ([place])   History of CVA (cerebrovascular accident)   Hypertension   Hypokalemia   Hypomagnesemia   Dehydration   Intractable diarrhea    Melissa [name] is a 79 y.o. Caucasian female with medical history significant of hypertension, stroke, pancreatic cancer on chemotherapy, glaucoma, who presented with diarrhea, nausea, vomiting.   Nausea vomiting and diarrhea: Likely due to chemo.  Patient had a negative C. difficile PCR on 2/12.   -IV fluid: 1 L normal saline, followed by 75 cc/h. -As needed Zofran for nausea - continue octreotide 150 mg 3 times daily per Dr. Tasia Catchings   Hypokalemia and hypomagnesemia:  Hypophos K= 2.4 and Mg 1.6 on admission. - Repleted   Malignant neoplasm of head of pancreas [facility]): on chemotherapy -Dr. Tasia Catchings of oncology is consulted  History of CVA (cerebrovascular accident): -not taking meds now. No acute issues  HTN:  -hold amlodipine due to dehydration and normal blood pressure (128/66) -hydralazine prn  Dehydration: -IVF as above   DVT prophylaxis: Lovenox SQ Code Status: DNR  Family Communication: not today Disposition Plan: home after diarrhea improves and electrolytes stable without needing repletion   Subjective and Interval History:  Pt reported continued diarrhea but improved.  Had abdominal discomfort.  No fever, dyspnea, chest pain, N/V, dysuria, increased swelling.   Objective: Vitals:   05/02/19 2242 05/03/19 0724 05/03/19 1519 05/04/19 0007  BP: (!) 125/57 139/61 (!) 165/80 132/66  Pulse: 91 91 (!) 56 88  Resp: 16 17 17 17   Temp: 97.8 F (36.6 C) 97.8 F (36.6 C) 98.6 F (37 C) 98.2 F (36.8 C)  TempSrc: Oral Oral Oral Oral  SpO2: 100% 98% 100% 94%  Weight: 59.1 kg      Height:        Intake/Output Summary (Last 24 hours) at 05/04/2019 0100 Last data filed at 05/03/2019 1830 Gross per 24 hour  Intake 1787.5 ml  Output --  Net 1787.5 ml   Filed Weights   05/02/19 1052 05/02/19 2242  Weight: 56.2 kg 59.1 kg    Examination:   Constitutional: NAD, AAOx3 HEENT: conjunctivae and lids normal, EOMI CV: RRR no M,R,G. Distal pulses +2.  No cyanosis.   RESP: CTA B/L, normal respiratory effort  GI:  Hyperactive BS, ND Extremities: No effusions, edema, or tenderness in BLE SKIN: warm, dry and intact Neuro: II - XII grossly intact.  Sensation intact Psych: Normal mood and affect.  Appropriate judgement and reason   Data Reviewed: I have personally reviewed following labs and imaging studies  CBC: Recent Labs  Lab 05/02/19 0917 05/03/19 0331  WBC 2.2* 2.9*  NEUTROABS 1.3*  --   HGB 10.8* 10.2*  HCT 32.5* 29.9*  MCV 102.2* 101.4*  PLT 288 [id number]   Basic Metabolic Panel: Recent Labs  Lab 04/29/19 0940 05/02/19 0917 05/03/19 0046 05/03/19 0331 05/03/19 1847  NA 134* 139  --  141  --   K 2.8* 2.4*  --  2.6* 3.3*  CL 102 105  --  109  --   CO2 21* 20*  --  20*  --   GLUCOSE 141* 132*  --  83  --   BUN 12 8  --  8  --   CREATININE 0.61  0.63  --  0.55  --   CALCIUM 7.8* 7.8*  --  7.3*  --   MG 1.7 1.6*  --  2.6*  --   PHOS  --   --  1.9*  --   --    GFR: Estimated Creatinine Clearance: 46.6 mL/min (by C-G formula based on SCr of 0.55 mg/dL). Liver Function Tests: Recent Labs  Lab 05/02/19 0917  AST 17  ALT 13  ALKPHOS 241*  BILITOT 0.8  PROT 6.1*  ALBUMIN 2.8*   No results for input(s): LIPASE, AMYLASE in the last 168 hours. No results for input(s): AMMONIA in the last 168 hours. Coagulation Profile: No results for input(s): INR, PROTIME in the last 168 hours. Cardiac Enzymes: No results for input(s): CKTOTAL, CKMB, CKMBINDEX, TROPONINI in the last 168 hours. BNP (last 3 results) No results for input(s): PROBNP in the last  8760 hours. HbA1C: No results for input(s): HGBA1C in the last 72 hours. CBG: No results for input(s): GLUCAP in the last 168 hours. Lipid Profile: No results for input(s): CHOL, HDL, LDLCALC, TRIG, CHOLHDL, LDLDIRECT in the last 72 hours. Thyroid Function Tests: No results for input(s): TSH, T4TOTAL, FREET4, T3FREE, THYROIDAB in the last 72 hours. Anemia Panel: No results for input(s): VITAMINB12, FOLATE, FERRITIN, TIBC, IRON, RETICCTPCT in the last 72 hours. Sepsis Labs: No results for input(s): PROCALCITON, LATICACIDVEN in the last 168 hours.  Recent Results (from the past 240 hour(s))  C difficile quick screen w PCR reflex     Status: None   Collection Time: 04/29/19  1:30 PM   Specimen: STOOL  Result Value Ref Range Status   C Diff antigen NEGATIVE NEGATIVE Final   C Diff toxin NEGATIVE NEGATIVE Final   C Diff interpretation No C. difficile detected.  Final    Comment: Performed at [facility], [place], [place] [address]  SARS CORONAVIRUS 2 (TAT 6-24 HRS) Nasopharyngeal Nasopharyngeal Swab     Status: None   Collection Time: 05/02/19  3:26 PM   Specimen: Nasopharyngeal Swab  Result Value Ref Range Status   SARS Coronavirus 2 NEGATIVE NEGATIVE Final    Comment: (NOTE) SARS-CoV-2 target nucleic acids are NOT DETECTED. The SARS-CoV-2 RNA is generally detectable in upper and lower respiratory specimens during the acute phase of infection. Negative results do not preclude SARS-CoV-2 infection, do not rule out co-infections with other pathogens, and should not be used as the sole basis for treatment or other patient management decisions. Negative results must be combined with clinical observations, patient history, and epidemiological information. The expected result is Negative. Fact Sheet for Patients: SugarRoll.be Fact Sheet for Healthcare Providers: https://www.woods-mathews.com/ This test is not yet  approved or cleared by the [place] FDA and  has been authorized for detection and/or diagnosis of SARS-CoV-2 by FDA under an Emergency Use Authorization (EUA). This EUA will remain  in effect (meaning this test can be used) for the duration of the COVID-19 declaration under Section 56 4(b)(1) of the Act, 21 U.S.C. section 360bbb-3(b)(1), unless the authorization is terminated or revoked sooner. Performed at [facility], [place] [address], [place], [place] [address]       Radiology Studies: No results found.   Scheduled Meds: . dicyclomine  10 mg Oral TID AC & HS  . enoxaparin (LOVENOX) injection  40 mg Subcutaneous Q24H  . gabapentin  300 mg Oral BID  . lipase/protease/amylase  72,000 Units Oral TID AC  . loperamide  2 mg  Oral See admin instructions  . octreotide  150 mcg Subcutaneous TID  . ondansetron (ZOFRAN) IV  4 mg Intravenous Once   Continuous Infusions: . sodium chloride 75 mL/hr at 05/03/19 1111     LOS: 1 day     Enzo Bi, MD [facility] If 7PM-7AM, please contact night-coverage 05/04/2019, 1:00 AM

## 2019-05-03 NOTE — Consult Note (Addendum)
Jonathon Bellows , MD [address], [place], [place], [place], [address] [address], [place], [place], [place], [address] Phone: [phone]  Fax: [phone]  Consultation  Referring Provider:    Dr Blaine Hamper  Primary Care Physician:  Adin Hector, MD Primary Gastroenterologist:  Dr. Allen Norris         Reason for Consultation:     Nausea and vomiting  Date of Admission:  05/02/2019 Date of Consultation:  05/03/2019         HPI:   Melissa [name] is a 79 y.o. female who has a history of metastatic pancreatic cancer and had a stent placed in the common bile duct in 10/2018.  Recent note by Dr. Tasia Catchings in oncology mentions stage IV pancreatic cancer.  She has had diarrhea secondary to chemotherapy and radiation.  Has been treated with octreotide injections but unable to get outpatient supply for octreotide.  She was admitted on 05/01/2018 with diarrhea nausea and vomiting.  03/31/2019: CT scan of the abdomen and pelvis with contrast demonstrates mild progression of metastatic disease.  Upper omental soft tissue neutrophils are slightly increased.  Moderate to severe right hydroureteronephrosis to the level of the lower right lumbar ureter.  Osseous metastasis also noted.  Hemoglobin 10.2 g with an MCV of 101.4.  Port potassium of 2.6 creatinine 0.55 magnesium of 2.6.  She says she only had one episode of vomiting which has resolved, been having diarrhea 3-4 times  A day very watery no blood last 1 week , feels it is getting better, bowel movements preceeded by lower abdominal cramping. Usually after meals.  Past Medical History:  Diagnosis Date  . Cancer [facility])    pancreatic   . Glaucoma   . Hypertension   . Osteopenia   . Stroke [facility])     Past Surgical History:  Procedure Laterality Date  . BREAST BIOPSY Right    core- stereo- neg  . ERCP N/A 10/29/2018   Procedure: ENDOSCOPIC RETROGRADE CHOLANGIOPANCREATOGRAPHY (ERCP);  Surgeon: [name], MD;  Location: [facility] ENDOSCOPY;  Service:  Endoscopy;  Laterality: N/A;  . ERCP N/A 01/28/2019   Procedure: ENDOSCOPIC RETROGRADE CHOLANGIOPANCREATOGRAPHY (ERCP) STENT REMOVAL;  Surgeon: [name], MD;  Location: [facility] ENDOSCOPY;  Service: Endoscopy;  Laterality: N/A;  . EUS N/A 11/18/2018   Procedure: FULL UPPER ENDOSCOPIC ULTRASOUND (EUS) RADIAL;  Surgeon: Holly Bodily, MD;  Location: [facility] ENDOSCOPY;  Service: Gastroenterology;  Laterality: N/A;  . HIP ARTHROPLASTY Left 01/02/2016   Procedure: ARTHROPLASTY BIPOLAR HIP (HEMIARTHROPLASTY);  Surgeon: Dereck Leep, MD;  Location: [facility] ORS;  Service: Orthopedics;  Laterality: Left;  . JOINT REPLACEMENT Left    THR  . PORTACATH PLACEMENT Right 11/26/2018   Procedure: INSERTION PORT-A-CATH;  Surgeon: Jules Husbands, MD;  Location: [facility] ORS;  Service: General;  Laterality: Right;  . TONSILLECTOMY      Prior to Admission medications   Medication Sig Start Date End Date Taking? Authorizing Provider  acetaminophen (TYLENOL) 500 MG tablet Take 500 mg by mouth every 6 (six) hours as needed.   Yes [provider]  amLODipine (NORVASC) 10 MG tablet Take 10 mg by mouth at bedtime.    Yes [provider]  dexamethasone (DECADRON) 4 MG tablet Take 2 tablets (8 mg total) by mouth daily. Start the day after irinotecan chemotherapy for 2 days. 03/31/19  Yes Earlie Server, MD  diphenoxylate-atropine (LOMOTIL) 2.5-0.025 MG tablet Take 1-2 tablets by mouth 4 (four) times daily as needed for diarrhea or  loose stools. 04/26/19  Yes Earlie Server, MD  gabapentin (NEURONTIN) 300 MG capsule Take 1 capsule (300 mg total) by mouth 2 (two) times daily. 03/22/19  Yes Earlie Server, MD  magic mouthwash w/lidocaine SOLN Take 5 mLs by mouth 4 (four) times daily as needed for mouth pain. Sig: Swish/Swallow 5-10 ml four times a day as needed. Dispense 480 ml. 1RF 04/19/19  Yes Earlie Server, MD  nystatin (MYCOSTATIN) 100000 UNIT/ML suspension Take 5 mLs (500,000 Units total) by mouth 4 (four) times daily. 04/26/19  Yes Earlie Server, MD  ondansetron (ZOFRAN) 8 MG tablet Take 1 tablet by mouth as needed. 11/24/18  Yes [provider]  oxyCODONE (ROXICODONE) 5 MG immediate release tablet Take 1 tablet (5 mg total) by mouth every 6 (six) hours as needed for moderate pain or severe pain. 03/15/19  Yes Earlie Server, MD  potassium chloride SA (KLOR-CON) 20 MEQ tablet Take 1 tablet (20 mEq total) by mouth daily. 04/29/19  Yes Earlie Server, MD  valACYclovir (VALTREX) 500 MG tablet Take 1 tablet (500 mg total) by mouth 2 (two) times daily. 04/19/19  Yes Earlie Server, MD  lidocaine-prilocaine (EMLA) cream Apply to affected area once Patient not taking: Reported on 05/02/2019 03/31/19   Earlie Server, MD  loperamide (IMODIUM A-D) 2 MG tablet Take 1 tablet (2 mg total) by mouth See admin instructions. Take 2 at diarrhea onset , then 1 every 2hr until 12hrs with no BM. May take 2 every 4hrs at night. If diarrhea recurs repeat. Patient not taking: Reported on 04/29/2019 03/31/19   Earlie Server, MD  octreotide (SANDOSTATIN) 100 MCG/ML SOLN injection Inject 1 mL (100 mcg total) into the skin in the morning, at noon, and at bedtime. Patient not taking: Reported on 05/02/2019 04/29/19   Earlie Server, MD  prochlorperazine (COMPAZINE) 10 MG tablet Take 1 tablet (10 mg total) by mouth every 6 (six) hours as needed (NAUSEA). Patient not taking: Reported on 04/29/2019 03/31/19   Earlie Server, MD    Family History  Problem Relation Age of Onset  . Kidney disease Father   . Breast cancer Neg Hx      Social History   Tobacco Use  . Smoking status: Never Smoker  . Smokeless tobacco: Never Used  Substance Use Topics  . Alcohol use: No  . Drug use: Never    Allergies as of 05/02/2019  . (No Known Allergies)    Review of Systems:    All systems reviewed and negative except where noted in HPI.   Physical Exam:  Vital signs in last 24 hours: Temp:  [97.8 F (36.6 C)] 97.8 F (36.6 C) (02/16 0724) Pulse Rate:  [77-148] 91 (02/16 0724) Resp:  [15-20] 17 (02/16  0724) BP: (125-153)/(57-71) 139/61 (02/16 0724) SpO2:  [97 %-100 %] 98 % (02/16 0724) Weight:  [59.1 kg] 59.1 kg (02/15 2242) Last BM Date: 05/03/19 General:   Pleasant, cooperative in NAD Head:  Normocephalic and atraumatic. Eyes:   No icterus.   Conjunctiva pink. PERRLA. Ears:  Normal auditory acuity. Abdomen:  Soft, nondistended, nontender. Normal bowel sounds. No appreciable masses or hepatomegaly.  No rebound or guarding.  Neurologic:  Alert and oriented x3;  grossly normal neurologically. Psych:  Alert and cooperative. Normal affect.  LAB RESULTS: Recent Labs    05/02/19 0917 05/03/19 0331  WBC 2.2* 2.9*  HGB 10.8* 10.2*  HCT 32.5* 29.9*  PLT 288 267   BMET Recent Labs    05/02/19 0917 05/03/19 0331  NA 139 141  K 2.4* 2.6*  CL 105 109  CO2 20* 20*  GLUCOSE 132* 83  BUN 8 8  CREATININE 0.63 0.55  CALCIUM 7.8* 7.3*   LFT Recent Labs    05/02/19 0917  PROT 6.1*  ALBUMIN 2.8*  AST 17  ALT 13  ALKPHOS 241*  BILITOT 0.8   PT/INR No results for input(s): LABPROT, INR in the last 72 hours.  STUDIES: No results found.    Impression / Plan:   MICHELLE REDPATH is a 79 y.o. y/o female with a history of metastatic pancreatic cancer on chemotherapy admitted with vomiting x1 episode that has resolved, diarrhea 3-4 times a day for the past one week. Differentials include chemotherapy induced vs pancreatic insufficiency vs infectious or a combination .   Plan  1. Check GI PCR and fecal calprotectin 2. IV fluids and replace electrolytes 3. Avoid artificial sugars and fructose in diet  4. Commence on pancreatic lipase 70,000 units before each meal and 35,000 units before each snack  5. Bentyl QID for cramps Thank you for involving me in the care of this patient.      LOS: 0 days   Jonathon Bellows, MD  05/03/2019, 12:55 PM

## 2019-05-03 NOTE — Progress Notes (Signed)
Patient admitted to 129 from ED. Alert and oriented x4 with no complaints. K+ and magnesium infusions complete.

## 2019-05-04 ENCOUNTER — Ambulatory Visit: Payer: Self-pay | Admitting: Urology

## 2019-05-04 DIAGNOSIS — C259 Malignant neoplasm of pancreas, unspecified: Secondary | ICD-10-CM

## 2019-05-04 LAB — PHOSPHORUS: Phosphorus: 2.3 mg/dL — ABNORMAL LOW (ref 2.5–4.6)

## 2019-05-04 LAB — MAGNESIUM: Magnesium: 2.2 mg/dL (ref 1.7–2.4)

## 2019-05-04 LAB — BASIC METABOLIC PANEL
Anion gap: 11 (ref 5–15)
BUN: 7 mg/dL — ABNORMAL LOW (ref 8–23)
CO2: 20 mmol/L — ABNORMAL LOW (ref 22–32)
Calcium: 7 mg/dL — ABNORMAL LOW (ref 8.9–10.3)
Chloride: 109 mmol/L (ref 98–111)
Creatinine, Ser: 0.66 mg/dL (ref 0.44–1.00)
GFR calc Af Amer: >60 mL/min (ref >60)
GFR calc non Af Amer: >60 mL/min (ref >60)
Glucose, Bld: 151 mg/dL — ABNORMAL HIGH (ref 70–99)
Potassium: 3.6 mmol/L (ref 3.5–5.1)
Sodium: 140 mmol/L (ref 135–145)

## 2019-05-04 MED ORDER — K PHOS MONO-SOD PHOS DI & MONO 155-852-130 MG PO TABS
500.0000 mg | ORAL_TABLET | Freq: Three times a day (TID) | ORAL | Status: AC
  Administered 2019-05-04 (×3): 500 mg via ORAL
  Filled 2019-05-04 (×3): qty 2

## 2019-05-04 MED ORDER — ENSURE ENLIVE PO LIQD
237.0000 mL | Freq: Three times a day (TID) | ORAL | Status: DC
  Administered 2019-05-04 – 2019-05-08 (×4): 237 mL via ORAL

## 2019-05-04 NOTE — Plan of Care (Signed)
  Problem: Education: Goal: Knowledge of General Education information will improve Description Including pain rating scale, medication(s)/side effects and non-pharmacologic comfort measures Outcome: Progressing   Problem: Health Behavior/Discharge Planning: Goal: Ability to manage health-related needs will improve Outcome: Progressing   Problem: Clinical Measurements: Goal: Ability to maintain clinical measurements within normal limits will improve Outcome: Progressing Goal: Will remain free from infection Outcome: Progressing Goal: Diagnostic test results will improve Outcome: Progressing Goal: Respiratory complications will improve Outcome: Progressing Goal: Cardiovascular complication will be avoided Outcome: Progressing   Problem: Nutrition: Goal: Adequate nutrition will be maintained Outcome: Progressing   Problem: Skin Integrity: Goal: Risk for impaired skin integrity will decrease Outcome: Progressing   

## 2019-05-04 NOTE — TOC Progression Note (Signed)
Transition of Care [facility]) - Progression Note    Patient Details  Name: LATANIA BASCOMB MRN: [id number] Date of Birth: 1940-04-18  Transition of Care [facility]) CM/SW Contact  Yogesh Cominsky, Gardiner Rhyme, LCSW Phone Number: 05/04/2019, 9:24 AM  Clinical Narrative:   Met with pt to see how feeling. She reports feeling better and having two large BM's. Have ordered rw in her room for her to take when discharged. Will continue to follow for support. Pt hopes to go home soon, but wants to be ready to go.     Expected Discharge Plan: Home/Self Care Barriers to Discharge: Continued Medical Work up  Expected Discharge Plan and Services Expected Discharge Plan: Home/Self Care In-house Referral: Clinical Social Work     Living arrangements for the past 2 months: Single Family Home                                       Social Determinants of Health (SDOH) Interventions    Readmission Risk Interventions No flowsheet data found.

## 2019-05-04 NOTE — Progress Notes (Signed)
Initial Nutrition Assessment  RD working remotely.  DOCUMENTATION CODES:   Not applicable  INTERVENTION:  Recommend liberalizing diet to regular.  Provide Ensure Enlive po TID, each supplement provides 350 kcal and 20 grams of protein.  NUTRITION DIAGNOSIS:   Increased nutrient needs related to catabolic illness(stage IV pancreatic cancer) as evidenced by estimated needs.  GOAL:   Patient will meet greater than or equal to 90% of their needs  MONITOR:   PO intake, Supplement acceptance, Labs, Weight trends, I & O's  REASON FOR ASSESSMENT:   Malnutrition Screening Tool    ASSESSMENT:   79 year old female with PMHx of HTN, glaucoma, hx CVA, osteopenia, stage IV pancreatic cancer admitted with N/V and diarrhea likely due to chemotherapy.   Spoke with patient over the phone. She reports she has had a decreased appetite and intake for about a week now. She is hesitant to eat much due to ongoing diarrhea. She reports she has learned about importance of choosing calorie- and protein-dense foods but would like to wait until diarrhea improves before she eats more at meals. She reports she drinks Ensure and Boost at least twice daily usually and would like to drink some here.  Patient reports her UBW was 160 lbs prior to diagnosis. Patient was 150.25 lbs on 10/29/2018. She is currently 59.1 kg (130.29 lbs). She has lost 19.96 lbs (13.3% body weight) over the past 6 months, which is significant for time frame.  Medications reviewed and include: dicyclomine, gabapentin, Creon 72000 units TID, octreotide, K Phos neutral 500 mg TID today.  Labs reviewed: CO2 20, BUN 7, Phosphorus 2.3.  RD suspects patient is malnourished but unable to confirm without completing NFPE.  NUTRITION - FOCUSED PHYSICAL EXAM:  Unable to complete at this time as RD working remotely.  Diet Order:   Diet Order            Diet Heart Room service appropriate? Yes; Fluid consistency: Thin  Diet effective now              EDUCATION NEEDS:   No education needs have been identified at this time  Skin:  Skin Assessment: Reviewed RN Assessment  Last BM:  05/04/2019 large type 7  Height:   Ht Readings from Last 1 Encounters:  05/02/19 5\' 2"  (1.575 m)   Weight:   Wt Readings from Last 1 Encounters:  05/02/19 59.1 kg   Ideal Body Weight:  50 kg  BMI:  Body mass index is 23.83 kg/m.  Estimated Nutritional Needs:   Kcal:  1600-1800  Protein:  80-90 grams  Fluid:  1.5-1.7 L/day  Jacklynn Barnacle, MS, RD, LDN Pager number available on Amion

## 2019-05-04 NOTE — Progress Notes (Signed)
PROGRESS NOTE    Melissa [name]  [id number] DOB: [birth year] DOA: 05/02/2019 PCP: Adin Hector, MD    Assessment & Plan:   Principal Problem:   Nausea vomiting and diarrhea Active Problems:   Malignant neoplasm of head of pancreas ([place])   History of CVA (cerebrovascular accident)   Hypertension   Hypokalemia   Hypomagnesemia   Dehydration   Intractable diarrhea    Melissa [name] is a 79 y.o. Caucasian female with medical history significant of hypertension, stroke, pancreatic cancer on chemotherapy, glaucoma, who presented with diarrhea, nausea, vomiting.   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 difficile PCR on 2/12.  GI path pending. -IV fluid: 1 L normal saline, followed by 75 cc/h. -As needed Zofran for nausea --Imodium - continue octreotide 150 mg 3 times daily per Dr. Linus Salmons was added for abdominal cramps. Check GI PCR and fecal Calprotectin. --Add Creon, by GI  #Mucositis, continue Magic mouthwash.  Hypokalemia and hypomagnesemia:  Hypophos K= 2.4 and Mg 1.6 on admission. - Repleted   Metastatic Malignant neoplasm of head of pancreas [facility]): on chemotherapy -Dr. Tasia Catchings of oncology is consulted --Patient has establish care with palliative service outpatient.  History of CVA (cerebrovascular accident): -not taking meds now. No acute issues  HTN:  -hold amlodipine due to dehydration and normal blood pressure (128/66) -hydralazine prn  Dehydration: -IVF as above   DVT prophylaxis: Lovenox SQ Code Status: DNR  Family Communication: updated husband at bedside Disposition Plan: home after diarrhea improves and electrolytes stable without needing repletion   Subjective and Interval History:  Still having large episodes of diarrhea, but improved from prior.  No fever, dyspnea, chest pain, N/V, dysuria, increased  swelling.   Objective: Vitals:   05/03/19 1519 05/04/19 0007 05/04/19 0819 05/04/19 1715  BP: (!) 165/80 132/66 137/67 137/65  Pulse: (!) 56 88 88 88  Resp: 17 17 16 17   Temp: 98.6 F (37 C) 98.2 F (36.8 C) 98.1 F (36.7 C) 97.7 F (36.5 C)  TempSrc: Oral Oral Oral Oral  SpO2: 100% 94% 98% 97%  Weight:      Height:        Intake/Output Summary (Last 24 hours) at 05/04/2019 1957 Last data filed at 05/04/2019 0700 Gross per 24 hour  Intake --  Output 1 ml  Net -1 ml   Filed Weights   05/02/19 1052 05/02/19 2242  Weight: 56.2 kg 59.1 kg    Examination:   Constitutional: NAD, AAOx3 HEENT: conjunctivae and lids normal, EOMI CV: RRR no M,R,G. Distal pulses +2.  No cyanosis.   RESP: CTA B/L, normal respiratory effort  GI:  +BS, ND Extremities: No effusions, edema, or tenderness in BLE SKIN: warm, dry and intact Neuro: II - XII grossly intact.  Sensation intact Psych: Depressed mood and affect.  Appropriate judgement and reason   Data Reviewed: I have personally reviewed following labs and imaging studies  CBC: Recent Labs  Lab 05/02/19 0917 05/03/19 0331  WBC 2.2* 2.9*  NEUTROABS 1.3*  --   HGB 10.8* 10.2*  HCT 32.5* 29.9*  MCV 102.2* 101.4*  PLT 288 [id number]   Basic Metabolic Panel: Recent Labs  Lab 04/29/19 0940 05/02/19 0917 05/03/19 0046 05/03/19 0331 05/03/19 1847 05/04/19 0341  NA 134* 139  --  141  --  140  K 2.8* 2.4*  --  2.6* 3.3* 3.6  CL 102 105  --  109  --  109  CO2 21* 20*  --  20*  --  20*  GLUCOSE 141* 132*  --  83  --  151*  BUN 12 8  --  8  --  7*  CREATININE 0.61 0.63  --  0.55  --  0.66  CALCIUM 7.8* 7.8*  --  7.3*  --  7.0*  MG 1.7 1.6*  --  2.6*  --  2.2  PHOS  --   --  1.9*  --   --  2.3*   GFR: Estimated Creatinine Clearance: 46.6 mL/min (by C-G formula based on SCr of 0.66 mg/dL). Liver Function Tests: Recent Labs  Lab 05/02/19 0917  AST 17  ALT 13  ALKPHOS 241*  BILITOT 0.8  PROT 6.1*  ALBUMIN 2.8*   No results  for input(s): LIPASE, AMYLASE in the last 168 hours. No results for input(s): AMMONIA in the last 168 hours. Coagulation Profile: No results for input(s): INR, PROTIME in the last 168 hours. Cardiac Enzymes: No results for input(s): CKTOTAL, CKMB, CKMBINDEX, TROPONINI in the last 168 hours. BNP (last 3 results) No results for input(s): PROBNP in the last 8760 hours. HbA1C: No results for input(s): HGBA1C in the last 72 hours. CBG: No results for input(s): GLUCAP in the last 168 hours. Lipid Profile: No results for input(s): CHOL, HDL, LDLCALC, TRIG, CHOLHDL, LDLDIRECT in the last 72 hours. Thyroid Function Tests: No results for input(s): TSH, T4TOTAL, FREET4, T3FREE, THYROIDAB in the last 72 hours. Anemia Panel: No results for input(s): VITAMINB12, FOLATE, FERRITIN, TIBC, IRON, RETICCTPCT in the last 72 hours. Sepsis Labs: No results for input(s): PROCALCITON, LATICACIDVEN in the last 168 hours.  Recent Results (from the past 240 hour(s))  C difficile quick screen w PCR reflex     Status: None   Collection Time: 04/29/19  1:30 PM   Specimen: STOOL  Result Value Ref Range Status   C Diff antigen NEGATIVE NEGATIVE Final   C Diff toxin NEGATIVE NEGATIVE Final   C Diff interpretation No C. difficile detected.  Final    Comment: Performed at [facility], [place], [place] [address]  SARS CORONAVIRUS 2 (TAT 6-24 HRS) Nasopharyngeal Nasopharyngeal Swab     Status: None   Collection Time: 05/02/19  3:26 PM   Specimen: Nasopharyngeal Swab  Result Value Ref Range Status   SARS Coronavirus 2 NEGATIVE NEGATIVE Final    Comment: (NOTE) SARS-CoV-2 target nucleic acids are NOT DETECTED. The SARS-CoV-2 RNA is generally detectable in upper and lower respiratory specimens during the acute phase of infection. Negative results do not preclude SARS-CoV-2 infection, do not rule out co-infections with other pathogens, and should not be used as the sole basis for treatment  or other patient management decisions. Negative results must be combined with clinical observations, patient history, and epidemiological information. The expected result is Negative. Fact Sheet for Patients: SugarRoll.be Fact Sheet for Healthcare Providers: https://www.woods-mathews.com/ This test is not yet approved or cleared by the [place] FDA and  has been authorized for detection and/or diagnosis of SARS-CoV-2 by FDA under an Emergency Use Authorization (EUA). This EUA will remain  in effect (meaning this test can be used) for the duration of the COVID-19 declaration under Section 56 4(b)(1) of the Act, 21 U.S.C. section 360bbb-3(b)(1), unless the authorization is terminated or revoked sooner. Performed at [facility], [place] [address], [place], [place] [address]       Radiology Studies: No results  found.   Scheduled Meds: . dicyclomine  10 mg Oral TID AC & HS  . enoxaparin (LOVENOX) injection  40 mg Subcutaneous Q24H  . feeding supplement (ENSURE ENLIVE)  237 mL Oral TID BM  . gabapentin  300 mg Oral BID  . lipase/protease/amylase  72,000 Units Oral TID AC  . loperamide  2 mg Oral See admin instructions  . octreotide  150 mcg Subcutaneous TID  . ondansetron (ZOFRAN) IV  4 mg Intravenous Once  . phosphorus  500 mg Oral TID   Continuous Infusions: . sodium chloride 75 mL/hr at 05/03/19 1111     LOS: 1 day     Enzo Bi, MD [facility] If 7PM-7AM, please contact night-coverage 05/04/2019, 7:57 PM

## 2019-05-04 NOTE — Progress Notes (Signed)
Hematology/Oncology Progress Note [facility] [phone] [phone]  Patient Care Team: Adin Hector, MD as PCP - General (Internal Medicine) Clent Jacks, RN as Oncology Nurse Navigator   Name of the patient: Melissa [name]  UF:[id number]  05-25-1940  Date of visit: 05/04/19   INTERVAL HISTORY-  She continues to have diarrhea but gradually improving.  Abdominal discomfort.  No fever or chills. Feels weak.  Patient's husband is at the bedside.    Review of systems- Review of Systems  Constitutional: Positive for appetite change and fatigue. Negative for chills and fever.  HENT:   Negative for hearing loss and voice change.   Eyes: Negative for eye problems.  Respiratory: Negative for chest tightness and cough.   Cardiovascular: Negative for chest pain.  Gastrointestinal: Positive for diarrhea. Negative for abdominal distention and blood in stool.  Endocrine: Negative for hot flashes.  Genitourinary: Negative for difficulty urinating and frequency.   Musculoskeletal: Negative for arthralgias.  Skin: Negative for itching and rash.  Neurological: Negative for extremity weakness.  Hematological: Negative for adenopathy.  Psychiatric/Behavioral: Negative for confusion.    No Known Allergies  Patient Active Problem List   Diagnosis Date Noted  . Intractable diarrhea 05/03/2019  . Hypomagnesemia 05/02/2019  . Nausea vomiting and diarrhea 05/02/2019  . Dehydration 05/02/2019  . Severe protein-calorie malnutrition ([place]) 04/29/2019  . Diarrhea 04/29/2019  . Genetic testing 04/26/2019  . Fitting and adjustment of gastrointestinal appliance and device   . Obstruction of bile duct   . Encounter for antineoplastic chemotherapy 12/28/2018  . Weight loss 12/28/2018  . Normocytic anemia 12/21/2018  . Hypokalemia 11/30/2018  . Glaucoma (increased eye pressure) 11/29/2018  . Hyperglycemia 11/29/2018  . Hyperlipidemia 11/29/2018    . Hypertension 11/29/2018  . Osteopenia 11/29/2018  . Subclinical hyperthyroidism 11/29/2018  . Goals of care, counseling/discussion 11/16/2018  . Malignant neoplasm of head of pancreas ([place]) 11/02/2018  . Obstructive jaundice due to malignant neoplasm ([place]) 10/29/2018  . Pancreatic tumor   . Obstructive jaundice 10/21/2018  . 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 History of fracture of left hip 02/06/2016  . Closed left hip fracture ([place]) 01/01/2016  . History of CVA (cerebrovascular accident) 07/26/2015     Past Medical History:  Diagnosis Date  . Cancer [facility])    pancreatic   . Glaucoma   . Hypertension   . Osteopenia   . Stroke [facility])      Past Surgical History:  Procedure Laterality Date  . BREAST BIOPSY Right    core- stereo- neg  . ERCP N/A 10/29/2018   Procedure: ENDOSCOPIC RETROGRADE CHOLANGIOPANCREATOGRAPHY (ERCP);  Surgeon: [name], MD;  Location: [facility] ENDOSCOPY;  Service: Endoscopy;  Laterality: N/A;  . ERCP N/A 01/28/2019   Procedure: ENDOSCOPIC RETROGRADE CHOLANGIOPANCREATOGRAPHY (ERCP) STENT REMOVAL;  Surgeon: [name], MD;  Location: [facility] ENDOSCOPY;  Service: Endoscopy;  Laterality: N/A;  . EUS N/A 11/18/2018   Procedure: FULL UPPER ENDOSCOPIC ULTRASOUND (EUS) RADIAL;  Surgeon: Holly Bodily, MD;  Location: [facility] ENDOSCOPY;  Service: Gastroenterology;  Laterality: N/A;  . HIP ARTHROPLASTY Left 01/02/2016   Procedure: ARTHROPLASTY BIPOLAR HIP (HEMIARTHROPLASTY);  Surgeon: Dereck Leep, MD;  Location: [facility] ORS;  Service: Orthopedics;  Laterality: Left;  . JOINT REPLACEMENT Left    THR  . PORTACATH PLACEMENT Right 11/26/2018   Procedure: INSERTION PORT-A-CATH;  Surgeon: Jules Husbands, MD;  Location: [facility] ORS;  Service: General;  Laterality: Right;  . TONSILLECTOMY  Social History   Socioeconomic History  . Marital status: Married    Spouse name: Not on file  . Number of children: Not on file  . Years of education: Not on file   . Highest education level: Not on file  Occupational History  . Not on file  Tobacco Use  . Smoking status: Never Smoker  . Smokeless tobacco: Never Used  Substance and Sexual Activity  . Alcohol use: No  . Drug use: Never  . Sexual activity: Not on file  Other Topics Concern  . Not on file  Social History Narrative  . Not on file   Social Determinants of Health   Financial Resource Strain:   . Difficulty of Paying Living Expenses: Not on file  Food Insecurity:   . Worried About Charity fundraiser in the Last Year: Not on file  . Ran Out of Food in the Last Year: Not on file  Transportation Needs:   . Lack of Transportation (Medical): Not on file  . Lack of Transportation (Non-Medical): Not on file  Physical Activity:   . Days of Exercise per Week: Not on file  . Minutes of Exercise per Session: Not on file  Stress:   . Feeling of Stress : Not on file  Social Connections:   . Frequency of Communication with Friends and Family: Not on file  . Frequency of Social Gatherings with Friends and Family: Not on file  . Attends Religious Services: Not on file  . Active Member of Clubs or Organizations: Not on file  . Attends Archivist Meetings: Not on file  . Marital Status: Not on file  Intimate Partner Violence:   . Fear of Current or Ex-Partner: Not on file  . Emotionally Abused: Not on file  . Physically Abused: Not on file  . Sexually Abused: Not on file     Family History  Problem Relation Age of Onset  . Kidney disease Father   . Breast cancer Neg Hx      Current Facility-Administered Medications:  .  0.9 %  sodium chloride infusion, , Intravenous, Continuous, Ivor Costa, MD, Last Rate: 75 mL/hr at 05/03/19 1111, New Bag at 05/03/19 1111 .  acetaminophen (TYLENOL) tablet 500 mg, 500 mg, Oral, Q6H PRN, Ivor Costa, MD .  dicyclomine (BENTYL) capsule 10 mg, 10 mg, Oral, TID AC & HS, Jonathon Bellows, MD, 10 mg at 05/04/19 1704 .  diphenoxylate-atropine  (LOMOTIL) 2.5-0.025 MG per tablet 1-2 tablet, 1-2 tablet, Oral, QID PRN, Ivor Costa, MD .  enoxaparin (LOVENOX) injection 40 mg, 40 mg, Subcutaneous, Q24H, Ivor Costa, MD, 40 mg at 05/04/19 0026 .  feeding supplement (ENSURE ENLIVE) (ENSURE ENLIVE) liquid 237 mL, 237 mL, Oral, TID BM, Enzo Bi, MD, 237 mL at 05/04/19 1424 .  gabapentin (NEURONTIN) capsule 300 mg, 300 mg, Oral, BID, Ivor Costa, MD, 300 mg at 05/04/19 0945 .  hydrALAZINE (APRESOLINE) tablet 25 mg, 25 mg, Oral, TID PRN, Ivor Costa, MD .  magic mouthwash, 5 mL, Oral, QID PRN **AND** lidocaine (XYLOCAINE) 2 % viscous mouth solution 5 mL, 5 mL, Mouth/Throat, QID PRN, Hart Robinsons A, RPH .  lipase/protease/amylase (CREON) capsule 72,000 Units, 72,000 Units, Oral, TID Maeola Harman, MD, 72,000 Units at 05/04/19 1704 .  loperamide (IMODIUM) capsule 2 mg, 2 mg, Oral, See admin instructions, Ivor Costa, MD, 2 mg at 05/03/19 1055 .  octreotide (SANDOSTATIN) injection 150 mcg, 150 mcg, Subcutaneous, TID, Ivor Costa, MD, 150 mcg at 05/04/19 1522 .  ondansetron (ZOFRAN) injection 4 mg, 4 mg, Intravenous, Once, Ivor Costa, MD .  ondansetron [facility]) injection 4 mg, 4 mg, Intravenous, Q8H PRN, Ivor Costa, MD, 4 mg at 05/02/19 2029 .  oxyCODONE (Oxy IR/ROXICODONE) immediate release tablet 5 mg, 5 mg, Oral, Q6H PRN, Ivor Costa, MD, 5 mg at 05/04/19 1424 .  phosphorus (K PHOS NEUTRAL) tablet 500 mg, 500 mg, Oral, TID, Enzo Bi, MD, 500 mg at 05/04/19 1704   Physical exam:  Vitals:   05/03/19 1519 05/04/19 0007 05/04/19 0819 05/04/19 1715  BP: (!) 165/80 132/66 137/67 137/65  Pulse: (!) 56 88 88 88  Resp: 17 17 16 17   Temp: 98.6 F (37 C) 98.2 F (36.8 C) 98.1 F (36.7 C) 97.7 F (36.5 C)  TempSrc: Oral Oral Oral Oral  SpO2: 100% 94% 98% 97%  Weight:      Height:       Physical Exam  Constitutional: She is oriented to person, place, and time. No distress.  HENT:  Head: Normocephalic and atraumatic.  Mouth/Throat: No oropharyngeal  exudate.  Eyes: Pupils are equal, round, and reactive to light. EOM are normal. No scleral icterus.  Cardiovascular: Normal rate and regular rhythm.  No murmur heard. Pulmonary/Chest: Effort normal. No respiratory distress.  Abdominal: Soft. Bowel sounds are normal. She exhibits no distension.  Musculoskeletal:        General: No edema. Normal range of motion.     Cervical back: Normal range of motion and neck supple.  Neurological: She is alert and oriented to person, place, and time.  Skin: Skin is warm and dry. She is not diaphoretic. No erythema.  Psychiatric: Affect normal.       CMP Latest Ref Rng & Units 05/04/2019  Glucose 70 - 99 mg/dL 151(H)  BUN 8 - 23 mg/dL 7(L)  Creatinine 0.44 - 1.00 mg/dL 0.66  Sodium 135 - 145 mmol/L 140  Potassium 3.5 - 5.1 mmol/L 3.6  Chloride 98 - 111 mmol/L 109  CO2 22 - 32 mmol/L 20(L)  Calcium 8.9 - 10.3 mg/dL 7.0(L)  Total Protein 6.5 - 8.1 g/dL -  Total Bilirubin 0.3 - 1.2 mg/dL -  Alkaline Phos 38 - 126 U/L -  AST 15 - 41 U/L -  ALT 0 - 44 U/L -   CBC Latest Ref Rng & Units 05/03/2019  WBC 4.0 - 10.5 K/uL 2.9(L)  Hemoglobin 12.0 - 15.0 g/dL 10.2(L)  Hematocrit 36.0 - 46.0 % 29.9(L)  Platelets 150 - 400 K/uL 267   RADIOGRAPHIC STUDIES: I have personally reviewed the radiological images as listed and agreed with the findings in the report.  US Venous Img Lower Bilateral  Result Date: 04/05/2019 CLINICAL DATA:  Acute bilateral lower extremity swelling. EXAM: BILATERAL LOWER EXTREMITY VENOUS DOPPLER ULTRASOUND TECHNIQUE: Gray-scale sonography with graded compression, as well as color Doppler and duplex ultrasound were performed to evaluate the lower extremity deep venous systems from the level of the common femoral vein and including the common femoral, femoral, profunda femoral, popliteal and calf veins including the posterior tibial, peroneal and gastrocnemius veins when visible. The superficial great saphenous vein was also  interrogated. Spectral Doppler was utilized to evaluate flow at rest and with distal augmentation maneuvers in the common femoral, femoral and popliteal veins. COMPARISON:  None. FINDINGS: RIGHT LOWER EXTREMITY Common Femoral Vein: No evidence of thrombus. Normal compressibility, respiratory phasicity and response to augmentation. Saphenofemoral Junction: No evidence of thrombus. Normal compressibility and flow on color Doppler imaging. Profunda Femoral Vein: No  evidence of thrombus. Normal compressibility and flow on color Doppler imaging. Femoral Vein: No evidence of thrombus. Normal compressibility, respiratory phasicity and response to augmentation. Popliteal Vein: No evidence of thrombus. Normal compressibility, respiratory phasicity and response to augmentation. Calf Veins: No evidence of thrombus. Normal compressibility and flow on color Doppler imaging. Venous Reflux:  None. Other Findings: Probable 3.7 cm Baker's cyst is noted in popliteal fossa. LEFT LOWER EXTREMITY Common Femoral Vein: No evidence of thrombus. Normal compressibility, respiratory phasicity and response to augmentation. Saphenofemoral Junction: No evidence of thrombus. Normal compressibility and flow on color Doppler imaging. Profunda Femoral Vein: No evidence of thrombus. Normal compressibility and flow on color Doppler imaging. Femoral Vein: No evidence of thrombus. Normal compressibility, respiratory phasicity and response to augmentation. Popliteal Vein: No evidence of thrombus. Normal compressibility, respiratory phasicity and response to augmentation. Calf Veins: No evidence of thrombus. Normal compressibility and flow on color Doppler imaging. Venous Reflux:  None. Other Findings: Probable 1.3 cm Baker's cyst seen in left popliteal fossa. IMPRESSION: No evidence of deep venous thrombosis in either lower extremity. Electronically Signed   By: Marijo Conception M.D.   On: 04/05/2019 15:21    Assessment and plan-  Patient is a 79 y.o.  female female with a history of metastatic pancreatic cancer, on second line chemotherapy with 5-FU and liposomal irinotecan, developed refractory diarrhea, failed outpatient treatment admitted for diarrhea, poor oral intake, electrolyte disturbance.  #Diarrhea, most likely secondary to irinotecan toxicity.  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  C. difficile was checked outpatient was negative. Clinically her symptoms are improving.  Continue continuous IV fluid, octreotide 150 MCG 3 times daily.  Imodium. GI recommendation was reviewed.  Creon was added Bentyl was added for abdominal cramps. Check GI PCR and fecal Calprotectin.  #Mucositis, continue Magic mouthwash. #Severe hypokalemia, potassium is normalized 3.6 today. #Hypophosphatemia, phosphorus 2.3. Continue K-Phos 500 mg 3 times daily. monitor electrolytes daily  #Metastatic pancreatic cancer, outpatient follow-up.  Prognosis is poor.  Patient has establish care with palliative service outpatient.  CODE STATUS DNR Plan was discussed with Dr. Billie Ruddy. Thank you for allowing me to participate in the care of this patient.   Earlie Server, MD, PhD Hematology Oncology [facility] Pager- [phone] 05/04/2019

## 2019-05-05 ENCOUNTER — Inpatient Hospital Stay: Payer: Medicare Other

## 2019-05-05 DIAGNOSIS — R140 Abdominal distension (gaseous): Secondary | ICD-10-CM

## 2019-05-05 LAB — CBC
HCT: 29.7 % — ABNORMAL LOW (ref 36.0–46.0)
Hemoglobin: 9.8 g/dL — ABNORMAL LOW (ref 12.0–15.0)
MCH: 33.8 pg (ref 26.0–34.0)
MCHC: 33 g/dL (ref 30.0–36.0)
MCV: 102.4 fL — ABNORMAL HIGH (ref 80.0–100.0)
Platelets: 227 10*3/uL (ref 150–400)
RBC: 2.9 MIL/uL — ABNORMAL LOW (ref 3.87–5.11)
RDW: 16.4 % — ABNORMAL HIGH (ref 11.5–15.5)
WBC: 4.8 10*3/uL (ref 4.0–10.5)
nRBC: 0.6 % — ABNORMAL HIGH (ref 0.0–0.2)

## 2019-05-05 LAB — MAGNESIUM
Magnesium: 1.6 mg/dL — ABNORMAL LOW (ref 1.7–2.4)
Magnesium: 1.8 mg/dL (ref 1.7–2.4)

## 2019-05-05 LAB — POTASSIUM: Potassium: 2.6 mmol/L — CL (ref 3.5–5.1)

## 2019-05-05 LAB — BASIC METABOLIC PANEL
Anion gap: 7 (ref 5–15)
BUN: 7 mg/dL — ABNORMAL LOW (ref 8–23)
CO2: 24 mmol/L (ref 22–32)
Calcium: 6.8 mg/dL — ABNORMAL LOW (ref 8.9–10.3)
Chloride: 108 mmol/L (ref 98–111)
Creatinine, Ser: 0.53 mg/dL (ref 0.44–1.00)
GFR calc Af Amer: >60 mL/min (ref >60)
GFR calc non Af Amer: >60 mL/min (ref >60)
Glucose, Bld: 167 mg/dL — ABNORMAL HIGH (ref 70–99)
Potassium: 2.5 mmol/L — CL (ref 3.5–5.1)
Sodium: 139 mmol/L (ref 135–145)

## 2019-05-05 LAB — PHOSPHORUS: Phosphorus: 2.5 mg/dL (ref 2.5–4.6)

## 2019-05-05 MED ORDER — MAGNESIUM SULFATE 2 GM/50ML IV SOLN
2.0000 g | Freq: Once | INTRAVENOUS | Status: AC
  Administered 2019-05-05: 2 g via INTRAVENOUS
  Filled 2019-05-05: qty 50

## 2019-05-05 MED ORDER — POTASSIUM CHLORIDE CRYS ER 20 MEQ PO TBCR
40.0000 meq | EXTENDED_RELEASE_TABLET | ORAL | Status: AC
  Administered 2019-05-05 – 2019-05-06 (×3): 40 meq via ORAL
  Filled 2019-05-05 (×4): qty 2

## 2019-05-05 MED ORDER — CHLORHEXIDINE GLUCONATE CLOTH 2 % EX PADS
6.0000 | MEDICATED_PAD | Freq: Every day | CUTANEOUS | Status: DC
  Administered 2019-05-05 – 2019-05-08 (×4): 6 via TOPICAL

## 2019-05-05 MED ORDER — POTASSIUM CHLORIDE CRYS ER 20 MEQ PO TBCR
40.0000 meq | EXTENDED_RELEASE_TABLET | ORAL | Status: DC
  Administered 2019-05-05 (×2): 40 meq via ORAL
  Filled 2019-05-05 (×2): qty 2

## 2019-05-05 NOTE — Plan of Care (Signed)
  Problem: Education: Goal: Knowledge of General Education information will improve Description: Including pain rating scale, medication(s)/side effects and non-pharmacologic comfort measures Outcome: Progressing   Problem: Health Behavior/Discharge Planning: Goal: Ability to manage health-related needs will improve Outcome: Progressing   Problem: Clinical Measurements: Goal: Ability to maintain clinical measurements within normal limits will improve Outcome: Progressing Goal: Will remain free from infection Outcome: Progressing Goal: Diagnostic test results will improve Outcome: Progressing Goal: Respiratory complications will improve Outcome: Progressing Goal: Cardiovascular complication will be avoided Outcome: Progressing   Problem: Nutrition: Goal: Adequate nutrition will be maintained Outcome: Progressing Note: Patient reporting decreased appetite. Dietary supplement provided and encouraged.    Problem: Skin Integrity: Goal: Risk for impaired skin integrity will decrease Outcome: Progressing

## 2019-05-05 NOTE — Progress Notes (Signed)
Hematology/Oncology Progress Note [facility] Telephone:([phone] [phone]  Patient Care Team: Adin Hector, MD as PCP - General (Internal Medicine) Clent Jacks, RN as Oncology Nurse Navigator   Name of the patient: Melissa [name]  OX:[id number]  [birth year]  Date of visit: 05/05/19   INTERVAL HISTORY-  She continues to have diarrhea but gradually improving.   She had 2 loose bowel movements since yesterday. Still feels very weak. Continue to have abdominal discomfort.  No fevers or chills.    Review of systems- Review of Systems  Constitutional: Positive for appetite change and fatigue. Negative for chills and fever.  HENT:   Negative for hearing loss and voice change.   Eyes: Negative for eye problems.  Respiratory: Negative for chest tightness and cough.   Cardiovascular: Negative for chest pain.  Gastrointestinal: Positive for abdominal pain and diarrhea. Negative for abdominal distention and blood in stool.  Endocrine: Negative for hot flashes.  Genitourinary: Negative for difficulty urinating and frequency.   Musculoskeletal: Negative for arthralgias.  Skin: Negative for itching and rash.  Neurological: Negative for extremity weakness.  Hematological: Negative for adenopathy.  Psychiatric/Behavioral: Negative for confusion.    No Known Allergies  Patient Active Problem List   Diagnosis Date Noted  . Intractable diarrhea 05/03/2019  . Hypomagnesemia 05/02/2019  . Nausea vomiting and diarrhea 05/02/2019  . Dehydration 05/02/2019  . Severe protein-calorie malnutrition ([place]) 04/29/2019  . Diarrhea 04/29/2019  . Genetic testing 04/26/2019  . Fitting and adjustment of gastrointestinal appliance and device   . Obstruction of bile duct   . Encounter for antineoplastic chemotherapy 12/28/2018  . Weight loss 12/28/2018  . Normocytic anemia 12/21/2018  . Hypokalemia 11/30/2018  . Glaucoma (increased eye pressure)  11/29/2018  . Hyperglycemia 11/29/2018  . Hyperlipidemia 11/29/2018  . Hypertension 11/29/2018  . Osteopenia 11/29/2018  . Subclinical hyperthyroidism 11/29/2018  . Goals of care, counseling/discussion 11/16/2018  . Malignant neoplasm of head of pancreas ([place]) 11/02/2018  . Obstructive jaundice due to malignant neoplasm ([place]) 10/29/2018  . Pancreatic tumor   . Obstructive jaundice 10/21/2018  . 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 History of fracture of left hip 02/06/2016  . Closed left hip fracture ([place]) 01/01/2016  . History of CVA (cerebrovascular accident) 07/26/2015     Past Medical History:  Diagnosis Date  . Cancer [facility])    pancreatic   . Glaucoma   . Hypertension   . Osteopenia   . Stroke [facility])      Past Surgical History:  Procedure Laterality Date  . BREAST BIOPSY Right    core- stereo- neg  . ERCP N/A 10/29/2018   Procedure: ENDOSCOPIC RETROGRADE CHOLANGIOPANCREATOGRAPHY (ERCP);  Surgeon: [name], MD;  Location: [facility] ENDOSCOPY;  Service: Endoscopy;  Laterality: N/A;  . ERCP N/A 01/28/2019   Procedure: ENDOSCOPIC RETROGRADE CHOLANGIOPANCREATOGRAPHY (ERCP) STENT REMOVAL;  Surgeon: [name], MD;  Location: [facility] ENDOSCOPY;  Service: Endoscopy;  Laterality: N/A;  . EUS N/A 11/18/2018   Procedure: FULL UPPER ENDOSCOPIC ULTRASOUND (EUS) RADIAL;  Surgeon: Holly Bodily, MD;  Location: [facility] ENDOSCOPY;  Service: Gastroenterology;  Laterality: N/A;  . HIP ARTHROPLASTY Left 01/02/2016   Procedure: ARTHROPLASTY BIPOLAR HIP (HEMIARTHROPLASTY);  Surgeon: Dereck Leep, MD;  Location: [facility] ORS;  Service: Orthopedics;  Laterality: Left;  . JOINT REPLACEMENT Left    THR  . PORTACATH PLACEMENT Right 11/26/2018   Procedure: INSERTION PORT-A-CATH;  Surgeon: Jules Husbands, MD;  Location: [facility] ORS;  Service: General;  Laterality:  Right;  Marland Kitchen TONSILLECTOMY      Social History   Socioeconomic History  . Marital status: Married    Spouse name: Not on file    . Number of children: Not on file  . Years of education: Not on file  . Highest education level: Not on file  Occupational History  . Not on file  Tobacco Use  . Smoking status: Never Smoker  . Smokeless tobacco: Never Used  Substance and Sexual Activity  . Alcohol use: No  . Drug use: Never  . Sexual activity: Not on file  Other Topics Concern  . Not on file  Social History Narrative  . Not on file   Social Determinants of Health   Financial Resource Strain:   . Difficulty of Paying Living Expenses: Not on file  Food Insecurity:   . Worried About Charity fundraiser in the Last Year: Not on file  . Ran Out of Food in the Last Year: Not on file  Transportation Needs:   . Lack of Transportation (Medical): Not on file  . Lack of Transportation (Non-Medical): Not on file  Physical Activity:   . Days of Exercise per Week: Not on file  . Minutes of Exercise per Session: Not on file  Stress:   . Feeling of Stress : Not on file  Social Connections:   . Frequency of Communication with Friends and Family: Not on file  . Frequency of Social Gatherings with Friends and Family: Not on file  . Attends Religious Services: Not on file  . Active Member of Clubs or Organizations: Not on file  . Attends Archivist Meetings: Not on file  . Marital Status: Not on file  Intimate Partner Violence:   . Fear of Current or Ex-Partner: Not on file  . Emotionally Abused: Not on file  . Physically Abused: Not on file  . Sexually Abused: Not on file     Family History  Problem Relation Age of Onset  . Kidney disease Father   . Breast cancer Neg Hx      Current Facility-Administered Medications:  .  0.9 %  sodium chloride infusion, , Intravenous, Continuous, Ivor Costa, MD, Last Rate: 75 mL/hr at 05/05/19 1338, Rate Verify at 05/05/19 1338 .  acetaminophen (TYLENOL) tablet 500 mg, 500 mg, Oral, Q6H PRN, Ivor Costa, MD .  Chlorhexidine Gluconate Cloth 2 % PADS 6 each, 6 each,  Topical, Daily, Enzo Bi, MD, 6 each at 05/05/19 1044 .  dicyclomine (BENTYL) capsule 10 mg, 10 mg, Oral, TID AC & HS, Jonathon Bellows, MD, 10 mg at 05/05/19 1711 .  diphenoxylate-atropine (LOMOTIL) 2.5-0.025 MG per tablet 1-2 tablet, 1-2 tablet, Oral, QID PRN, Ivor Costa, MD, 2 tablet at 05/05/19 1711 .  enoxaparin (LOVENOX) injection 40 mg, 40 mg, Subcutaneous, Q24H, Ivor Costa, MD, 40 mg at 05/04/19 2152 .  feeding supplement (ENSURE ENLIVE) (ENSURE ENLIVE) liquid 237 mL, 237 mL, Oral, TID BM, Enzo Bi, MD, 237 mL at 05/05/19 1049 .  gabapentin (NEURONTIN) capsule 300 mg, 300 mg, Oral, BID, Ivor Costa, MD, 300 mg at 05/05/19 1046 .  hydrALAZINE (APRESOLINE) tablet 25 mg, 25 mg, Oral, TID PRN, Ivor Costa, MD .  magic mouthwash, 5 mL, Oral, QID PRN **AND** lidocaine (XYLOCAINE) 2 % viscous mouth solution 5 mL, 5 mL, Mouth/Throat, QID PRN, Hart Robinsons A, RPH .  lipase/protease/amylase (CREON) capsule 72,000 Units, 72,000 Units, Oral, TID Maeola Harman, MD, 72,000 Units at 05/05/19 1048 .  loperamide (  IMODIUM) capsule 2 mg, 2 mg, Oral, See admin instructions, Ivor Costa, MD, 2 mg at 05/03/19 1055 .  octreotide (SANDOSTATIN) injection 150 mcg, 150 mcg, Subcutaneous, TID, Ivor Costa, MD, 150 mcg at 05/05/19 1552 .  ondansetron (ZOFRAN) injection 4 mg, 4 mg, Intravenous, Once, Ivor Costa, MD .  ondansetron [facility]) injection 4 mg, 4 mg, Intravenous, Q8H PRN, Ivor Costa, MD, 4 mg at 05/02/19 2029 .  oxyCODONE (Oxy IR/ROXICODONE) immediate release tablet 5 mg, 5 mg, Oral, Q6H PRN, Ivor Costa, MD, 5 mg at 05/05/19 1046 .  potassium chloride SA (KLOR-CON) CR tablet 40 mEq, 40 mEq, Oral, Q4H, Enzo Bi, MD   Physical exam:  Vitals:   05/04/19 1715 05/04/19 2254 05/05/19 0745 05/05/19 1627  BP: 137/65 136/66 139/68 102/61  Pulse: 88 79 90 82  Resp: 17 16 17 17   Temp: 97.7 F (36.5 C) 98.4 F (36.9 C) 97.7 F (36.5 C) 98 F (36.7 C)  TempSrc: Oral Oral Oral Oral  SpO2: 97% 94% 95% 98%  Weight:       Height:       Physical Exam  Constitutional: She is oriented to person, place, and time. No distress.  HENT:  Head: Normocephalic and atraumatic.  Mouth/Throat: No oropharyngeal exudate.  Eyes: Pupils are equal, round, and reactive to light. EOM are normal. No scleral icterus.  Cardiovascular: Normal rate and regular rhythm.  No murmur heard. Pulmonary/Chest: Effort normal. No respiratory distress.  Abdominal: Soft. Bowel sounds are normal. She exhibits distension.  Some diffuse tenderness with palpation.  Musculoskeletal:        General: No edema. Normal range of motion.     Cervical back: Normal range of motion and neck supple.  Neurological: She is alert and oriented to person, place, and time.  Skin: Skin is warm and dry. No erythema.  Psychiatric: Affect normal.       CMP Latest Ref Rng & Units 05/05/2019  Glucose 70 - 99 mg/dL -  BUN 8 - 23 mg/dL -  Creatinine 0.44 - 1.00 mg/dL -  Sodium 135 - 145 mmol/L -  Potassium 3.5 - 5.1 mmol/L 2.6(LL)  Chloride 98 - 111 mmol/L -  CO2 22 - 32 mmol/L -  Calcium 8.9 - 10.3 mg/dL -  Total Protein 6.5 - 8.1 g/dL -  Total Bilirubin 0.3 - 1.2 mg/dL -  Alkaline Phos 38 - 126 U/L -  AST 15 - 41 U/L -  ALT 0 - 44 U/L -   CBC Latest Ref Rng & Units 05/05/2019  WBC 4.0 - 10.5 K/uL 4.8  Hemoglobin 12.0 - 15.0 g/dL 9.8(L)  Hematocrit 36.0 - 46.0 % 29.7(L)  Platelets 150 - 400 K/uL 227   RADIOGRAPHIC STUDIES: I have personally reviewed the radiological images as listed and agreed with the findings in the report.  DG Abd 1 View  Result Date: 05/05/2019 CLINICAL DATA:  Abdominal distension EXAM: ABDOMEN - 1 VIEW COMPARISON:  03/31/2019 FINDINGS: Nonobstructive bowel gas pattern. No radio-opaque calculi or other significant radiographic abnormality are seen. Biliary stent is noted. IMPRESSION: Nonobstructive bowel gas pattern. Electronically Signed   By: Davina Poke D.O.   On: 05/05/2019 15:26    Assessment and plan-  Patient is a  79 y.o. female female with a history of metastatic pancreatic cancer, on second line chemotherapy with 5-FU and liposomal irinotecan, developed refractory diarrhea, failed outpatient treatment admitted for diarrhea, poor oral intake, electrolyte disturbance.  #Diarrhea, most likely secondary to irinotecan toxicity.  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  C. difficile was checked outpatient was negative. Clinically her symptoms are improving.  Continue continuous IV fluid, octreotide 150 MCG 3 times daily, Imodium, Lomotil, Creon, Bentyl was added for abdominal cramps. GI pathogen PCR and fecal Calprotectin results are pendng.  Abdomen was slightly more distended than yesterday, worse discomfort. I will obtain abd Xray.   #Mucositis, continue Magic mouthwash. #Severe hypokalemia, potassium chloride 40 mEq every 4 hours.  She is also on K-Phos Neutral 500 mg 3 times daily.  #Metastatic pancreatic cancer, outpatient follow-up.  Prognosis is poor.  Patient expresses her wish of not receiving additional chemotherapies.  I will consult palliative care.  CODE STATUS DNR  Thank you for allowing me to participate in the care of this patient.   Earlie Server, MD, PhD Hematology Oncology [facility] at [facility] Pager- [phone] 05/05/2019

## 2019-05-05 NOTE — Progress Notes (Signed)
PROGRESS NOTE    CHASITIE RITCHIE  [id number] DOB: 16-Jul-1940 DOA: 05/02/2019 PCP: Adin Hector, MD    Assessment & Plan:   Principal Problem:   Nausea vomiting and diarrhea Active Problems:   Malignant neoplasm of head of pancreas ([place])   History of CVA (cerebrovascular accident)   Hypertension   Hypokalemia   Hypomagnesemia   Dehydration   Intractable diarrhea    CLEATIS HOFFERT is a 79 y.o. Caucasian female with medical history significant of hypertension, stroke, pancreatic cancer on chemotherapy, glaucoma, who presented with diarrhea, nausea, vomiting.   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 difficile PCR on 2/12.  GI path pending. -IV fluid: 1 L normal saline, followed by 75 cc/h. -As needed Zofran for nausea --Lomotil - continue octreotide 150 mg 3 times daily per Dr. Linus Salmons was added for abdominal cramps. Check GI PCR and fecal Calprotectin. --continue Creon, by GI  #Mucositis, continue Magic mouthwash.  Hypokalemia  hypomagnesemia:  Hypophos - Replete PRN  Metastatic Malignant neoplasm of head of pancreas [facility]): on chemotherapy -Dr. Tasia Catchings of oncology is consulted --Patient has establish care with palliative service outpatient.  History of CVA (cerebrovascular accident): -not taking meds now. No acute issues  HTN:  -hold amlodipine due to dehydration and normal blood pressure (128/66) -hydralazine prn  Dehydration: -IVF as above   DVT prophylaxis: Lovenox SQ Code Status: DNR  Family Communication: not today Disposition Plan: home after diarrhea improves and electrolytes stable without needing repletion.  Still has significant daily drops in K+ level despite aggressive repletion.   Subjective and Interval History:  Pt reported 2 episodes of diarrhea and similar abdominal pain.  Not eating well.  No fever, dyspnea, chest pain, N/V,  dysuria.   Objective: Vitals:   05/04/19 0819 05/04/19 1715 05/04/19 2254 05/05/19 0745  BP: 137/67 137/65 136/66 139/68  Pulse: 88 88 79 90  Resp: 16 17 16 17   Temp: 98.1 F (36.7 C) 97.7 F (36.5 C) 98.4 F (36.9 C) 97.7 F (36.5 C)  TempSrc: Oral Oral Oral Oral  SpO2: 98% 97% 94% 95%  Weight:      Height:        Intake/Output Summary (Last 24 hours) at 05/05/2019 1622 Last data filed at 05/05/2019 1338 Gross per 24 hour  Intake 2681.1 ml  Output --  Net 2681.1 ml   Filed Weights   05/02/19 1052 05/02/19 2242  Weight: 56.2 kg 59.1 kg    Examination:   Constitutional: NAD, AAOx3 HEENT: conjunctivae and lids normal, EOMI CV: RRR no M,R,G. Distal pulses +2.  No cyanosis.   RESP: CTA B/L, normal respiratory effort  GI:  +BS, ND Extremities: No effusions, edema, or tenderness in BLE SKIN: warm, dry and intact Neuro: II - XII grossly intact.  Sensation intact Psych: Depressed mood and affect.  Appropriate judgement and reason   Data Reviewed: I have personally reviewed following labs and imaging studies  CBC: Recent Labs  Lab 05/02/19 0917 05/03/19 0331 05/05/19 0331  WBC 2.2* 2.9* 4.8  NEUTROABS 1.3*  --   --   HGB 10.8* 10.2* 9.8*  HCT 32.5* 29.9* 29.7*  MCV 102.2* 101.4* 102.4*  PLT 288 267 [id number]   Basic Metabolic Panel: Recent Labs  Lab 04/29/19 0940 04/29/19 0940 05/02/19 0917 05/03/19 0046 05/03/19 0331 05/03/19 1847 05/04/19 0341 05/05/19 0331  NA 134*  --  139  --  141  --  140 139  K 2.8*   < > 2.4*  --  2.6* 3.3* 3.6 2.5*  CL 102  --  105  --  109  --  109 108  CO2 21*  --  20*  --  20*  --  20* 24  GLUCOSE 141*  --  132*  --  83  --  151* 167*  BUN 12  --  8  --  8  --  7* 7*  CREATININE 0.61  --  0.63  --  0.55  --  0.66 0.53  CALCIUM 7.8*  --  7.8*  --  7.3*  --  7.0* 6.8*  MG 1.7  --  1.6*  --  2.6*  --  2.2 1.6*  PHOS  --   --   --  1.9*  --   --  2.3* 2.5   < > = values in this interval not displayed.   GFR: Estimated  Creatinine Clearance: 46.6 mL/min (by C-G formula based on SCr of 0.53 mg/dL). Liver Function Tests: Recent Labs  Lab 05/02/19 0917  AST 17  ALT 13  ALKPHOS 241*  BILITOT 0.8  PROT 6.1*  ALBUMIN 2.8*   No results for input(s): LIPASE, AMYLASE in the last 168 hours. No results for input(s): AMMONIA in the last 168 hours. Coagulation Profile: No results for input(s): INR, PROTIME in the last 168 hours. Cardiac Enzymes: No results for input(s): CKTOTAL, CKMB, CKMBINDEX, TROPONINI in the last 168 hours. BNP (last 3 results) No results for input(s): PROBNP in the last 8760 hours. HbA1C: No results for input(s): HGBA1C in the last 72 hours. CBG: No results for input(s): GLUCAP in the last 168 hours. Lipid Profile: No results for input(s): CHOL, HDL, LDLCALC, TRIG, CHOLHDL, LDLDIRECT in the last 72 hours. Thyroid Function Tests: No results for input(s): TSH, T4TOTAL, FREET4, T3FREE, THYROIDAB in the last 72 hours. Anemia Panel: No results for input(s): VITAMINB12, FOLATE, FERRITIN, TIBC, IRON, RETICCTPCT in the last 72 hours. Sepsis Labs: No results for input(s): PROCALCITON, LATICACIDVEN in the last 168 hours.  Recent Results (from the past 240 hour(s))  C difficile quick screen w PCR reflex     Status: None   Collection Time: 04/29/19  1:30 PM   Specimen: STOOL  Result Value Ref Range Status   C Diff antigen NEGATIVE NEGATIVE Final   C Diff toxin NEGATIVE NEGATIVE Final   C Diff interpretation No C. difficile detected.  Final    Comment: Performed at [facility], [name], [place] [address]  SARS CORONAVIRUS 2 (TAT 6-24 HRS) Nasopharyngeal Nasopharyngeal Swab     Status: None   Collection Time: 05/02/19  3:26 PM   Specimen: Nasopharyngeal Swab  Result Value Ref Range Status   SARS Coronavirus 2 NEGATIVE NEGATIVE Final    Comment: (NOTE) SARS-CoV-2 target nucleic acids are NOT DETECTED. The SARS-CoV-2 RNA is generally detectable in upper and  lower respiratory specimens during the acute phase of infection. Negative results do not preclude SARS-CoV-2 infection, do not rule out co-infections with other pathogens, and should not be used as the sole basis for treatment or other patient management decisions. Negative results must be combined with clinical observations, patient history, and epidemiological information. The expected result is Negative. Fact Sheet for Patients: SugarRoll.be Fact Sheet for Healthcare Providers: https://www.woods-mathews.com/ This test is not yet approved or cleared by the [place] FDA and  has been authorized for detection and/or diagnosis of SARS-CoV-2 by FDA under  an Emergency Use Authorization (EUA). This EUA will remain  in effect (meaning this test can be used) for the duration of the COVID-19 declaration under Section 56 4(b)(1) of the Act, 21 U.S.C. section 360bbb-3(b)(1), unless the authorization is terminated or revoked sooner. Performed at [facility], [place] [address] [place], [place] [address]       Radiology Studies: DG Abd 1 View  Result Date: 05/05/2019 CLINICAL DATA:  Abdominal distension EXAM: ABDOMEN - 1 VIEW COMPARISON:  03/31/2019 FINDINGS: Nonobstructive bowel gas pattern. No radio-opaque calculi or other significant radiographic abnormality are seen. Biliary stent is noted. IMPRESSION: Nonobstructive bowel gas pattern. Electronically Signed   By: Davina Poke D.O.   On: 05/05/2019 15:26     Scheduled Meds: . Chlorhexidine Gluconate Cloth  6 each Topical Daily  . dicyclomine  10 mg Oral TID AC & HS  . enoxaparin (LOVENOX) injection  40 mg Subcutaneous Q24H  . feeding supplement (ENSURE ENLIVE)  237 mL Oral TID BM  . gabapentin  300 mg Oral BID  . lipase/protease/amylase  72,000 Units Oral TID AC  . loperamide  2 mg Oral See admin instructions  . octreotide  150 mcg Subcutaneous TID  . ondansetron (ZOFRAN) IV  4 mg  Intravenous Once  . potassium chloride  40 mEq Oral Q4H   Continuous Infusions: . sodium chloride 75 mL/hr at 05/05/19 1338     LOS: 2 days     Enzo Bi, MD [facility] If 7PM-7AM, please contact night-coverage 05/05/2019, 4:22 PM

## 2019-05-06 DIAGNOSIS — K909 Intestinal malabsorption, unspecified: Secondary | ICD-10-CM

## 2019-05-06 DIAGNOSIS — Z515 Encounter for palliative care: Secondary | ICD-10-CM

## 2019-05-06 LAB — BASIC METABOLIC PANEL
Anion gap: 5 (ref 5–15)
BUN: 7 mg/dL — ABNORMAL LOW (ref 8–23)
CO2: 24 mmol/L (ref 22–32)
Calcium: 6.9 mg/dL — ABNORMAL LOW (ref 8.9–10.3)
Chloride: 112 mmol/L — ABNORMAL HIGH (ref 98–111)
Creatinine, Ser: 0.44 mg/dL (ref 0.44–1.00)
GFR calc Af Amer: >60 mL/min (ref >60)
GFR calc non Af Amer: >60 mL/min (ref >60)
Glucose, Bld: 129 mg/dL — ABNORMAL HIGH (ref 70–99)
Potassium: 3.9 mmol/L (ref 3.5–5.1)
Sodium: 141 mmol/L (ref 135–145)

## 2019-05-06 LAB — PHOSPHORUS: Phosphorus: 1.5 mg/dL — ABNORMAL LOW (ref 2.5–4.6)

## 2019-05-06 LAB — CBC
HCT: 31.5 % — ABNORMAL LOW (ref 36.0–46.0)
Hemoglobin: 10.7 g/dL — ABNORMAL LOW (ref 12.0–15.0)
MCH: 34.7 pg — ABNORMAL HIGH (ref 26.0–34.0)
MCHC: 34 g/dL (ref 30.0–36.0)
MCV: 102.3 fL — ABNORMAL HIGH (ref 80.0–100.0)
Platelets: 211 10*3/uL (ref 150–400)
RBC: 3.08 MIL/uL — ABNORMAL LOW (ref 3.87–5.11)
RDW: 16.5 % — ABNORMAL HIGH (ref 11.5–15.5)
WBC: 8.3 10*3/uL (ref 4.0–10.5)
nRBC: 0.6 % — ABNORMAL HIGH (ref 0.0–0.2)

## 2019-05-06 LAB — GI PATHOGEN PANEL BY PCR, STOOL

## 2019-05-06 LAB — MAGNESIUM: Magnesium: 1.8 mg/dL (ref 1.7–2.4)

## 2019-05-06 MED ORDER — K PHOS MONO-SOD PHOS DI & MONO 155-852-130 MG PO TABS
500.0000 mg | ORAL_TABLET | Freq: Three times a day (TID) | ORAL | Status: DC
  Administered 2019-05-06 – 2019-05-08 (×6): 500 mg via ORAL
  Filled 2019-05-06 (×9): qty 2

## 2019-05-06 MED ORDER — POTASSIUM PHOSPHATES 15 MMOLE/5ML IV SOLN
30.0000 mmol | Freq: Once | INTRAVENOUS | Status: DC
  Filled 2019-05-06: qty 10

## 2019-05-06 NOTE — Progress Notes (Signed)
Melissa [name] , MD [address] [place], [place], [place], [address] [address] [place], [place], [place], [address] Phone: [phone]  Fax: [phone]   Melissa [name] is being followed for diarrhea   Subjective: Stools are more formed diarrhea is less intense.   Objective: Vital signs in last 24 hours: Vitals:   05/05/19 0745 05/05/19 1627 05/05/19 2323 05/06/19 0824  BP: 139/68 102/61 133/66 (!) 124/56  Pulse: 90 82 89 86  Resp: 17 17 18 18   Temp: 97.7 F (36.5 C) 98 F (36.7 C) 97.8 F (36.6 C) 98.8 F (37.1 C)  TempSrc: Oral Oral Oral Oral  SpO2: 95% 98% 94% 94%  Weight:      Height:       Weight change:   Intake/Output Summary (Last 24 hours) at 05/06/2019 1044 Last data filed at 05/05/2019 1338 Gross per 24 hour  Intake 2681.1 ml  Output --  Net 2681.1 ml     Exam:  Abdomen: soft, nontender, normal bowel sounds   Lab Results: @LABTEST2 @ Micro Results: Recent Results (from the past 240 hour(s))  C difficile quick screen w PCR reflex     Status: None   Collection Time: 04/29/19  1:30 PM   Specimen: STOOL  Result Value Ref Range Status   C Diff antigen NEGATIVE NEGATIVE Final   C Diff toxin NEGATIVE NEGATIVE Final   C Diff interpretation No C. difficile detected.  Final    Comment: Performed at [facility], [place], [place] [address]  SARS CORONAVIRUS 2 (TAT 6-24 HRS) Nasopharyngeal Nasopharyngeal Swab     Status: None   Collection Time: 05/02/19  3:26 PM   Specimen: Nasopharyngeal Swab  Result Value Ref Range Status   SARS Coronavirus 2 NEGATIVE NEGATIVE Final    Comment: (NOTE) SARS-CoV-2 target nucleic acids are NOT DETECTED. The SARS-CoV-2 RNA is generally detectable in upper and lower respiratory specimens during the acute phase of infection. Negative results do not preclude SARS-CoV-2 infection, do not rule out co-infections with other pathogens, and should not be used as the sole basis for  treatment or other patient management decisions. Negative results must be combined with clinical observations, patient history, and epidemiological information. The expected result is Negative. Fact Sheet for Patients: SugarRoll.be Fact Sheet for Healthcare Providers: https://www.woods-mathews.com/ This test is not yet approved or cleared by the [place] FDA and  has been authorized for detection and/or diagnosis of SARS-CoV-2 by FDA under an Emergency Use Authorization (EUA). This EUA will remain  in effect (meaning this test can be used) for the duration of the COVID-19 declaration under Section 56 4(b)(1) of the Act, 21 U.S.C. section 360bbb-3(b)(1), unless the authorization is terminated or revoked sooner. Performed at [facility], [place] [address], [place], [place] [address]   GI pathogen panel by PCR, stool     Status: None   Collection Time: 05/03/19  1:54 PM   Specimen: Stool  Result Value Ref Range Status   Plesiomonas shigelloides NOT DETECTED NOT DETECTED Final   Yersinia enterocolitica NOT DETECTED NOT DETECTED Final   Vibrio NOT DETECTED NOT DETECTED Final   Enteropathogenic E coli NOT DETECTED NOT DETECTED Final   E coli (ETEC) LT/ST NOT DETECTED NOT DETECTED Final   E coli [id number] by PCR Not applicable NOT DETECTED Final   Cryptosporidium by PCR NOT DETECTED NOT DETECTED Final   Entamoeba histolytica NOT DETECTED NOT DETECTED Final   Adenovirus F 40/41 NOT DETECTED  NOT DETECTED Final   Norovirus GI/GII NOT DETECTED NOT DETECTED Final   Sapovirus NOT DETECTED NOT DETECTED Final    Comment: (NOTE) Performed At: [facility] [place], [place] [id number] Rush Farmer MD [id number]    Vibrio cholerae NOT DETECTED NOT DETECTED Final   Campylobacter by PCR NOT DETECTED NOT DETECTED Final   Salmonella by PCR NOT DETECTED NOT DETECTED Final   E coli (STEC) NOT DETECTED NOT DETECTED Final    Enteroaggregative E coli NOT DETECTED NOT DETECTED Final   Shigella by PCR NOT DETECTED NOT DETECTED Final   Cyclospora cayetanensis NOT DETECTED NOT DETECTED Final   Astrovirus NOT DETECTED NOT DETECTED Final   G lamblia by PCR NOT DETECTED NOT DETECTED Final   Rotavirus A by PCR NOT DETECTED NOT DETECTED Final   Studies/Results: DG Abd 1 View  Result Date: 05/05/2019 CLINICAL DATA:  Abdominal distension EXAM: ABDOMEN - 1 VIEW COMPARISON:  03/31/2019 FINDINGS: Nonobstructive bowel gas pattern. No radio-opaque calculi or other significant radiographic abnormality are seen. Biliary stent is noted. IMPRESSION: Nonobstructive bowel gas pattern. Electronically Signed   By: Davina Poke D.O.   On: 05/05/2019 15:26   Medications: I have reviewed the patient's current medications. Scheduled Meds: . Chlorhexidine Gluconate Cloth  6 each Topical Daily  . dicyclomine  10 mg Oral TID AC & HS  . enoxaparin (LOVENOX) injection  40 mg Subcutaneous Q24H  . feeding supplement (ENSURE ENLIVE)  237 mL Oral TID BM  . gabapentin  300 mg Oral BID  . lipase/protease/amylase  72,000 Units Oral TID AC  . loperamide  2 mg Oral See admin instructions  . octreotide  150 mcg Subcutaneous TID  . ondansetron (ZOFRAN) IV  4 mg Intravenous Once  . phosphorus  500 mg Oral TID   Continuous Infusions: . sodium chloride 75 mL/hr at 05/05/19 2237   PRN Meds:.acetaminophen, diphenoxylate-atropine, hydrALAZINE, magic mouthwash **AND** lidocaine, ondansetron (ZOFRAN) IV, oxyCODONE   Assessment: Principal Problem:   Nausea vomiting and diarrhea Active Problems:   Malignant neoplasm of head of pancreas (HCC)   History of CVA (cerebrovascular accident)   Hypertension   Hypokalemia   Hypomagnesemia   Dehydration   Intractable diarrhea   Abdominal distension, gaseous   Hypophosphatemia  HELLON [name] is a 79 y.o. y/o female with a history of metastatic pancreatic cancer on chemotherapy admitted with  vomiting x1 episode that has resolved, diarrhea 3-4 times a day for the past one week. Differentials include chemotherapy induced vs pancreatic insufficiency vs or a combination . GI PCR negative, she has improved after commencing on pancreatic lipase supplement.  Plan  1. F/u fecal calprotectin 2. IV fluids and replace electrolytes 3. Avoid artificial sugars and fructose in diet  4. Continue  pancreatic lipase 70,000 units before each meal and 35,000 units before each snack . Imodium PRN.  5. Bentyl QID for cramps   I will sign off.  Please call me if any further GI concerns or questions.  We would like to thank you for the opportunity to participate in the care of [name].   LOS: 3 days   Melissa Bellows, MD 05/06/2019, 10:44 AM

## 2019-05-06 NOTE — Progress Notes (Signed)
New referral for [facility] hospice services at home received form TOC Becky Dupree. Writer met in the room with patient to initiate education regarding hospice services, philosophy and team approach to care with understanding voiced. Hospice contact number given to Mrs. Rowe Robert. Patient information given top referral. No DME needs at this time. Please notify hospice at  [phone] if patient discharges home this weekend. Also please send signed out of facility DNR with patient. Thank you for the opportunity to be involved in the care of this patient and her family. Flo Shanks [facility], [place] [phone]

## 2019-05-06 NOTE — Care Management Important Message (Signed)
Important Message  Patient Details  Name: Melissa [name] MRN: [id number] Date of Birth: Sep 14, 1940   Medicare Important Message Given:  Yes     Elease Hashimoto, LCSW 05/06/2019, 12:28 PM

## 2019-05-06 NOTE — TOC Progression Note (Signed)
Transition of Care [facility]) - Progression Note    Patient Details  Name: Melissa [name] MRN: [id number] Date of Birth: [birth year]  Transition of Care [facility]) CM/SW Contact  Joyous Gleghorn, Gardiner Rhyme, LCSW Phone Number: 05/06/2019, 1:35 PM  Clinical Narrative:   Met with pt and husband to discuss Hospice choice and plan. Pt wants to use [facility] and has needed equipment-hospital bed, over bed table, wheelchairs-etc from taking care of her Mom and their daughter had medical issues. Pt states: " I can't handle the chemo they want to do."  Husband is here and very supportive of pt and her wishes. They have a daughter who lives next door who is also involved. Have let Karen-[facility] know and she plans to meet with pt and husband today. Will work on getting pt home [place] Glad to meet with.    Expected Discharge Plan: Home/Self Care Barriers to Discharge: Continued Medical Work up  Expected Discharge Plan and Services Expected Discharge Plan: Home/Self Care In-house Referral: Clinical Social Work     Living arrangements for the past 2 months: Single Family Home                                       Social Determinants of Health (SDOH) Interventions    Readmission Risk Interventions No flowsheet data found.

## 2019-05-06 NOTE — Progress Notes (Addendum)
Hematology/Oncology Progress Note [facility] [phone] Fax:([phone]) [phone]  Patient Care Team: Adin Hector, MD as PCP - General (Internal Medicine) Clent Jacks, RN as Oncology Nurse Navigator   Name of the patient: Melissa [name]  [id number]  September 02, 1940  Date of visit: 05/06/19   INTERVAL HISTORY-  Patient had big loose bowel movement this morning.  No diarrhea overnight. Feels slightly better compared to yesterday.  Abdominal discomfort is better. Still weak.    Review of systems- Review of Systems  Constitutional: Positive for appetite change and fatigue. Negative for chills and fever.  HENT:   Negative for hearing loss and voice change.   Eyes: Negative for eye problems.  Respiratory: Negative for chest tightness and cough.   Cardiovascular: Negative for chest pain.  Gastrointestinal: Positive for diarrhea. Negative for abdominal distention, abdominal pain and blood in stool.  Endocrine: Negative for hot flashes.  Genitourinary: Negative for difficulty urinating and frequency.   Musculoskeletal: Negative for arthralgias.  Skin: Negative for itching and rash.  Neurological: Negative for extremity weakness.  Hematological: Negative for adenopathy.  Psychiatric/Behavioral: Negative for confusion.    No Known Allergies  Patient Active Problem List   Diagnosis Date Noted  . Abdominal distension, gaseous   . Intractable diarrhea 05/03/2019  . Hypomagnesemia 05/02/2019  . Nausea vomiting and diarrhea 05/02/2019  . Dehydration 05/02/2019  . Severe protein-calorie malnutrition ([place]) 04/29/2019  . Diarrhea 04/29/2019  . Genetic testing 04/26/2019  . Fitting and adjustment of gastrointestinal appliance and device   . Obstruction of bile duct   . Encounter for antineoplastic chemotherapy 12/28/2018  . Weight loss 12/28/2018  . Normocytic anemia 12/21/2018  . Hypokalemia 11/30/2018  . Glaucoma (increased eye pressure)  11/29/2018  . Hyperglycemia 11/29/2018  . Hyperlipidemia 11/29/2018  . Hypertension 11/29/2018  . Osteopenia 11/29/2018  . Subclinical hyperthyroidism 11/29/2018  . Goals of care, counseling/discussion 11/16/2018  . Malignant neoplasm of head of pancreas ([place]) 11/02/2018  . Obstructive jaundice due to malignant neoplasm ([place]) 10/29/2018  . Pancreatic tumor   . Obstructive jaundice 10/21/2018  . 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 History of fracture of left hip 02/06/2016  . Closed left hip fracture ([place]) 01/01/2016  . History of CVA (cerebrovascular accident) 07/26/2015     Past Medical History:  Diagnosis Date  . Cancer [facility])    pancreatic   . Glaucoma   . Hypertension   . Osteopenia   . Stroke [facility]      Past Surgical History:  Procedure Laterality Date  . BREAST BIOPSY Right    core- stereo- neg  . ERCP N/A 10/29/2018   Procedure: ENDOSCOPIC RETROGRADE CHOLANGIOPANCREATOGRAPHY (ERCP);  Surgeon: [name], MD;  Location: [facility] ENDOSCOPY;  Service: Endoscopy;  Laterality: N/A;  . ERCP N/A 01/28/2019   Procedure: ENDOSCOPIC RETROGRADE CHOLANGIOPANCREATOGRAPHY (ERCP) STENT REMOVAL;  Surgeon: [name], MD;  Location: [facility] ENDOSCOPY;  Service: Endoscopy;  Laterality: N/A;  . EUS N/A 11/18/2018   Procedure: FULL UPPER ENDOSCOPIC ULTRASOUND (EUS) RADIAL;  Surgeon: Holly Bodily, MD;  Location: [facility] ENDOSCOPY;  Service: Gastroenterology;  Laterality: N/A;  . HIP ARTHROPLASTY Left 01/02/2016   Procedure: ARTHROPLASTY BIPOLAR HIP (HEMIARTHROPLASTY);  Surgeon: Dereck Leep, MD;  Location: [facility] ORS;  Service: Orthopedics;  Laterality: Left;  . JOINT REPLACEMENT Left    THR  . PORTACATH PLACEMENT Right 11/26/2018   Procedure: INSERTION PORT-A-CATH;  Surgeon: Jules Husbands, MD;  Location: [facility] ORS;  Service: General;  Laterality: Right;  .  TONSILLECTOMY      Social History   Socioeconomic History  . Marital status: Married    Spouse name: Not on file    . Number of children: Not on file  . Years of education: Not on file  . Highest education level: Not on file  Occupational History  . Not on file  Tobacco Use  . Smoking status: Never Smoker  . Smokeless tobacco: Never Used  Substance and Sexual Activity  . Alcohol use: No  . Drug use: Never  . Sexual activity: Not on file  Other Topics Concern  . Not on file  Social History Narrative  . Not on file   Social Determinants of Health   Financial Resource Strain:   . Difficulty of Paying Living Expenses: Not on file  Food Insecurity:   . Worried About Charity fundraiser in the Last Year: Not on file  . Ran Out of Food in the Last Year: Not on file  Transportation Needs:   . Lack of Transportation (Medical): Not on file  . Lack of Transportation (Non-Medical): Not on file  Physical Activity:   . Days of Exercise per Week: Not on file  . Minutes of Exercise per Session: Not on file  Stress:   . Feeling of Stress : Not on file  Social Connections:   . Frequency of Communication with Friends and Family: Not on file  . Frequency of Social Gatherings with Friends and Family: Not on file  . Attends Religious Services: Not on file  . Active Member of Clubs or Organizations: Not on file  . Attends Archivist Meetings: Not on file  . Marital Status: Not on file  Intimate Partner Violence:   . Fear of Current or Ex-Partner: Not on file  . Emotionally Abused: Not on file  . Physically Abused: Not on file  . Sexually Abused: Not on file     Family History  Problem Relation Age of Onset  . Kidney disease Father   . Breast cancer Neg Hx      Current Facility-Administered Medications:  .  0.9 %  sodium chloride infusion, , Intravenous, Continuous, Ivor Costa, MD, Last Rate: 75 mL/hr at 05/05/19 2237, New Bag at 05/05/19 2237 .  acetaminophen (TYLENOL) tablet 500 mg, 500 mg, Oral, Q6H PRN, Ivor Costa, MD .  Chlorhexidine Gluconate Cloth 2 % PADS 6 each, 6 each,  Topical, Daily, Enzo Bi, MD, 6 each at 05/05/19 1044 .  dicyclomine (BENTYL) capsule 10 mg, 10 mg, Oral, TID AC & HS, Jonathon Bellows, MD, 10 mg at 05/05/19 2235 .  diphenoxylate-atropine (LOMOTIL) 2.5-0.025 MG per tablet 1-2 tablet, 1-2 tablet, Oral, QID PRN, Ivor Costa, MD, 2 tablet at 05/05/19 1711 .  enoxaparin (LOVENOX) injection 40 mg, 40 mg, Subcutaneous, Q24H, Ivor Costa, MD, 40 mg at 05/05/19 2328 .  feeding supplement (ENSURE ENLIVE) (ENSURE ENLIVE) liquid 237 mL, 237 mL, Oral, TID BM, Enzo Bi, MD, 237 mL at 05/05/19 1049 .  gabapentin (NEURONTIN) capsule 300 mg, 300 mg, Oral, BID, Ivor Costa, MD, 300 mg at 05/05/19 2235 .  hydrALAZINE (APRESOLINE) tablet 25 mg, 25 mg, Oral, TID PRN, Ivor Costa, MD .  magic mouthwash, 5 mL, Oral, QID PRN **AND** lidocaine (XYLOCAINE) 2 % viscous mouth solution 5 mL, 5 mL, Mouth/Throat, QID PRN, Hart Robinsons A, RPH .  lipase/protease/amylase (CREON) capsule 72,000 Units, 72,000 Units, Oral, TID Maeola Harman, MD, 72,000 Units at 05/05/19 1048 .  loperamide (IMODIUM) capsule 2  mg, 2 mg, Oral, See admin instructions, Ivor Costa, MD, 2 mg at 05/03/19 1055 .  octreotide (SANDOSTATIN) injection 150 mcg, 150 mcg, Subcutaneous, TID, Ivor Costa, MD, 150 mcg at 05/05/19 2235 .  ondansetron (ZOFRAN) injection 4 mg, 4 mg, Intravenous, Once, Ivor Costa, MD .  ondansetron [facility]) injection 4 mg, 4 mg, Intravenous, Q8H PRN, Ivor Costa, MD, 4 mg at 05/02/19 2029 .  oxyCODONE (Oxy IR/ROXICODONE) immediate release tablet 5 mg, 5 mg, Oral, Q6H PRN, Ivor Costa, MD, 5 mg at 05/05/19 2005 .  phosphorus (K PHOS NEUTRAL) tablet 500 mg, 500 mg, Oral, TID, Enzo Bi, MD   Physical exam:  Vitals:   05/05/19 0745 05/05/19 1627 05/05/19 2323 05/06/19 0824  BP: 139/68 102/61 133/66 (!) 124/56  Pulse: 90 82 89 86  Resp: 17 17 18 18   Temp: 97.7 F (36.5 C) 98 F (36.7 C) 97.8 F (36.6 C) 98.8 F (37.1 C)  TempSrc: Oral Oral Oral Oral  SpO2: 95% 98% 94% 94%  Weight:       Height:       Physical Exam  Constitutional: She is oriented to person, place, and time. No distress.  HENT:  Head: Normocephalic and atraumatic.  Mouth/Throat: No oropharyngeal exudate.  Eyes: Pupils are equal, round, and reactive to light. EOM are normal. No scleral icterus.  Cardiovascular: Normal rate and regular rhythm.  No murmur heard. Pulmonary/Chest: Effort normal. No respiratory distress. She has no rales. She exhibits no tenderness.  Abdominal: Soft. Bowel sounds are normal. She exhibits distension. There is no abdominal tenderness.  Musculoskeletal:        General: No edema. Normal range of motion.     Cervical back: Normal range of motion and neck supple.  Neurological: She is alert and oriented to person, place, and time.  Skin: Skin is warm and dry. She is not diaphoretic. No erythema.  Psychiatric: Affect normal.       CMP Latest Ref Rng & Units 05/06/2019  Glucose 70 - 99 mg/dL 129(H)  BUN 8 - 23 mg/dL 7(L)  Creatinine 0.44 - 1.00 mg/dL 0.44  Sodium 135 - 145 mmol/L 141  Potassium 3.5 - 5.1 mmol/L 3.9  Chloride 98 - 111 mmol/L 112(H)  CO2 22 - 32 mmol/L 24  Calcium 8.9 - 10.3 mg/dL 6.9(L)  Total Protein 6.5 - 8.1 g/dL -  Total Bilirubin 0.3 - 1.2 mg/dL -  Alkaline Phos 38 - 126 U/L -  AST 15 - 41 U/L -  ALT 0 - 44 U/L -   CBC Latest Ref Rng & Units 05/06/2019  WBC 4.0 - 10.5 K/uL 8.3  Hemoglobin 12.0 - 15.0 g/dL 10.7(L)  Hematocrit 36.0 - 46.0 % 31.5(L)  Platelets 150 - 400 K/uL 211   RADIOGRAPHIC STUDIES: I have personally reviewed the radiological images as listed and agreed with the findings in the report.  DG Abd 1 View  Result Date: 05/05/2019 CLINICAL DATA:  Abdominal distension EXAM: ABDOMEN - 1 VIEW COMPARISON:  03/31/2019 FINDINGS: Nonobstructive bowel gas pattern. No radio-opaque calculi or other significant radiographic abnormality are seen. Biliary stent is noted. IMPRESSION: Nonobstructive bowel gas pattern. Electronically Signed   By:  Davina Poke D.O.   On: 05/05/2019 15:26    Assessment and plan-  Patient is a 79 y.o. female female with a history of metastatic pancreatic cancer, on second line chemotherapy with 5-FU and liposomal irinotecan, developed refractory diarrhea, failed outpatient treatment admitted for diarrhea, poor oral intake, electrolyte disturbance.  #Diarrhea, most  likely secondary to irinotecan toxicity.  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  C. difficile was checked outpatient was negative. Improving diarrhea symptoms.  Continue gentle hydration, octreotide 150 MCG 3 times daily.  Imodium and Lomotil as needed,  pancreatic enzymes.  GI pathogen PCR results are negative.  And fecal Calprotectin results are pendng.  ABD x-ray reviewed.  #Mucositis, improved.  Continue Magic mouthwash as needed.   #Hypophosphatemia, and hypokalemia, continue close monitoring.  Potassium level improved today.  Repletion per hospitalist team. #Metastatic pancreatic cancer, outpatient follow-up.  Prognosis is poor.  I have consulted palliative care service will further discuss with patient today.  CODE STATUS DNR  Thank you for allowing me to participate in the care of this patient.   Earlie Server, MD, PhD Hematology Oncology [facility] Pager- [phone] 05/06/2019

## 2019-05-06 NOTE — Progress Notes (Signed)
PROGRESS NOTE    Melissa [name]  [id number] DOB: 1940-03-24 DOA: 05/02/2019 PCP: Adin Hector, MD    Assessment & Plan:   Principal Problem:   Nausea vomiting and diarrhea Active Problems:   Malignant neoplasm of head of pancreas ([place])   History of CVA (cerebrovascular accident)   Hypertension   Hypokalemia   Hypomagnesemia   Dehydration   Intractable diarrhea   Abdominal distension, gaseous   Hypophosphatemia   Palliative care encounter    Melissa [name] is a 79 y.o. Caucasian female with medical history significant of hypertension, stroke, pancreatic cancer on chemotherapy, glaucoma, who presented with diarrhea, nausea, vomiting.   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 difficile PCR on 2/12.  GI path neg. -IV fluid: 1 L normal saline, followed by 75 cc/h. -As needed Zofran for nausea --Lomotil - continue octreotide 150 mg 3 times daily per Dr. Linus Salmons was added for abdominal cramps. Check GI PCR and fecal Calprotectin. --continue Creon, by GI  #Mucositis, continue Magic mouthwash.  Hypokalemia  hypomagnesemia:  Hypophos - Replete PRN  Metastatic Malignant neoplasm of head of pancreas [facility]): on chemotherapy -Dr. Tasia Catchings of oncology is consulted --Pt had palliative consult today and decided to go home with hospice.  History of CVA (cerebrovascular accident): -not taking meds now. No acute issues  HTN:  -hold amlodipine due to dehydration and normal blood pressure (128/66) -hydralazine prn  Dehydration: -IVF as above   DVT prophylaxis: Lovenox SQ Code Status: DNR  Family Communication: not today Disposition Plan: home with home hospice after diarrhea improves and electrolytes stable without needing repletion.  Still has significant daily drops in K+ level despite aggressive repletion.   Subjective and Interval History:  Pt reported stool a  little bit more thick.  Slept well.  No fever, dyspnea, chest pain, N/V, dysuria, increased swelling.  Pt had palliative consult and decided to go home with hospice.   Objective: Vitals:   05/05/19 1627 05/05/19 2323 05/06/19 0824 05/06/19 1420  BP: 102/61 133/66 (!) 124/56 124/66  Pulse: 82 89 86 91  Resp: 17 18 18 18   Temp: 98 F (36.7 C) 97.8 F (36.6 C) 98.8 F (37.1 C) 98.9 F (37.2 C)  TempSrc: Oral Oral Oral Oral  SpO2: 98% 94% 94% 94%  Weight:      Height:        Intake/Output Summary (Last 24 hours) at 05/06/2019 1841 Last data filed at 05/06/2019 1815 Gross per 24 hour  Intake 120 ml  Output --  Net 120 ml   Filed Weights   05/02/19 1052 05/02/19 2242  Weight: 56.2 kg 59.1 kg    Examination:   Constitutional: NAD, AAOx3 HEENT: conjunctivae and lids normal, EOMI CV: RRR no M,R,G. Distal pulses +2.  No cyanosis.   RESP: CTA B/L, normal respiratory effort  GI:  Hyperactive BS, ND Extremities: No effusions, edema, or tenderness in BLE SKIN: warm, dry and intact Neuro: II - XII grossly intact.  Sensation intact Psych: Depressed mood and affect.  Appropriate judgement and reason   Data Reviewed: I have personally reviewed following labs and imaging studies  CBC: Recent Labs  Lab 05/02/19 0917 05/03/19 0331 05/05/19 0331 05/06/19 0427  WBC 2.2* 2.9* 4.8 8.3  NEUTROABS 1.3*  --   --   --   HGB 10.8* 10.2* 9.8* 10.7*  HCT 32.5* 29.9* 29.7* 31.5*  MCV 102.2* 101.4* 102.4* 102.3*  PLT 288 267 227 [id number]   Basic Metabolic Panel: Recent Labs  Lab 05/02/19 0917 05/02/19 0917 05/03/19 0046 05/03/19 0331 05/03/19 0331 05/03/19 1847 05/04/19 0341 05/05/19 0331 05/05/19 1642 05/06/19 0427  NA 139  --   --  141  --   --  140 139  --  141  K 2.4*   < >  --  2.6*   < > 3.3* 3.6 2.5* 2.6* 3.9  CL 105  --   --  109  --   --  109 108  --  112*  CO2 20*  --   --  20*  --   --  20* 24  --  24  GLUCOSE 132*  --   --  83  --   --  151* 167*  --  129*  BUN 8   --   --  8  --   --  7* 7*  --  7*  CREATININE 0.63  --   --  0.55  --   --  0.66 0.53  --  0.44  CALCIUM 7.8*  --   --  7.3*  --   --  7.0* 6.8*  --  6.9*  MG 1.6*   < >  --  2.6*  --   --  2.2 1.6* 1.8 1.8  PHOS  --   --  1.9*  --   --   --  2.3* 2.5  --  1.5*   < > = values in this interval not displayed.   GFR: Estimated Creatinine Clearance: 46.6 mL/min (by C-G formula based on SCr of 0.44 mg/dL). Liver Function Tests: Recent Labs  Lab 05/02/19 0917  AST 17  ALT 13  ALKPHOS 241*  BILITOT 0.8  PROT 6.1*  ALBUMIN 2.8*   No results for input(s): LIPASE, AMYLASE in the last 168 hours. No results for input(s): AMMONIA in the last 168 hours. Coagulation Profile: No results for input(s): INR, PROTIME in the last 168 hours. Cardiac Enzymes: No results for input(s): CKTOTAL, CKMB, CKMBINDEX, TROPONINI in the last 168 hours. BNP (last 3 results) No results for input(s): PROBNP in the last 8760 hours. HbA1C: No results for input(s): HGBA1C in the last 72 hours. CBG: No results for input(s): GLUCAP in the last 168 hours. Lipid Profile: No results for input(s): CHOL, HDL, LDLCALC, TRIG, CHOLHDL, LDLDIRECT in the last 72 hours. Thyroid Function Tests: No results for input(s): TSH, T4TOTAL, FREET4, T3FREE, THYROIDAB in the last 72 hours. Anemia Panel: No results for input(s): VITAMINB12, FOLATE, FERRITIN, TIBC, IRON, RETICCTPCT in the last 72 hours. Sepsis Labs: No results for input(s): PROCALCITON, LATICACIDVEN in the last 168 hours.  Recent Results (from the past 240 hour(s))  C difficile quick screen w PCR reflex     Status: None   Collection Time: 04/29/19  1:30 PM   Specimen: STOOL  Result Value Ref Range Status   C Diff antigen NEGATIVE NEGATIVE Final   C Diff toxin NEGATIVE NEGATIVE Final   C Diff interpretation No C. difficile detected.  Final    Comment: Performed at [facility], [place], [place] [address]  SARS CORONAVIRUS 2 (TAT 6-24 HRS)  Nasopharyngeal Nasopharyngeal Swab     Status: None   Collection Time: 05/02/19  3:26 PM   Specimen: Nasopharyngeal Swab  Result Value Ref Range Status   SARS Coronavirus 2 NEGATIVE NEGATIVE Final    Comment: (NOTE) SARS-CoV-2 target nucleic acids are NOT DETECTED. The  SARS-CoV-2 RNA is generally detectable in upper and lower respiratory specimens during the acute phase of infection. Negative results do not preclude SARS-CoV-2 infection, do not rule out co-infections with other pathogens, and should not be used as the sole basis for treatment or other patient management decisions. Negative results must be combined with clinical observations, patient history, and epidemiological information. The expected result is Negative. Fact Sheet for Patients: SugarRoll.be Fact Sheet for Healthcare Providers: https://www.woods-mathews.com/ This test is not yet approved or cleared by the [place] FDA and  has been authorized for detection and/or diagnosis of SARS-CoV-2 by FDA under an Emergency Use Authorization (EUA). This EUA will remain  in effect (meaning this test can be used) for the duration of the COVID-19 declaration under Section 56 4(b)(1) of the Act, 21 U.S.C. section 360bbb-3(b)(1), unless the authorization is terminated or revoked sooner. Performed at [facility], [place] [address] [place], [place] [address]   GI pathogen panel by PCR, stool     Status: None   Collection Time: 05/03/19  1:54 PM   Specimen: Stool  Result Value Ref Range Status   Plesiomonas shigelloides NOT DETECTED NOT DETECTED Final   Yersinia enterocolitica NOT DETECTED NOT DETECTED Final   Vibrio NOT DETECTED NOT DETECTED Final   Enteropathogenic E coli NOT DETECTED NOT DETECTED Final   E coli (ETEC) LT/ST NOT DETECTED NOT DETECTED Final   E coli [id number] by PCR Not applicable NOT DETECTED Final   Cryptosporidium by PCR NOT DETECTED NOT DETECTED Final    Entamoeba histolytica NOT DETECTED NOT DETECTED Final   Adenovirus F 40/41 NOT DETECTED NOT DETECTED Final   Norovirus GI/GII NOT DETECTED NOT DETECTED Final   Sapovirus NOT DETECTED NOT DETECTED Final    Comment: (NOTE) Performed At: [facility] [place], [place] [id number] Rush Farmer MD [id number]    Vibrio cholerae NOT DETECTED NOT DETECTED Final   Campylobacter by PCR NOT DETECTED NOT DETECTED Final   Salmonella by PCR NOT DETECTED NOT DETECTED Final   E coli (STEC) NOT DETECTED NOT DETECTED Final   Enteroaggregative E coli NOT DETECTED NOT DETECTED Final   Shigella by PCR NOT DETECTED NOT DETECTED Final   Cyclospora cayetanensis NOT DETECTED NOT DETECTED Final   Astrovirus NOT DETECTED NOT DETECTED Final   G lamblia by PCR NOT DETECTED NOT DETECTED Final   Rotavirus A by PCR NOT DETECTED NOT DETECTED Final      Radiology Studies: DG Abd 1 View  Result Date: 05/05/2019 CLINICAL DATA:  Abdominal distension EXAM: ABDOMEN - 1 VIEW COMPARISON:  03/31/2019 FINDINGS: Nonobstructive bowel gas pattern. No radio-opaque calculi or other significant radiographic abnormality are seen. Biliary stent is noted. IMPRESSION: Nonobstructive bowel gas pattern. Electronically Signed   By: Davina Poke D.O.   On: 05/05/2019 15:26     Scheduled Meds: . Chlorhexidine Gluconate Cloth  6 each Topical Daily  . dicyclomine  10 mg Oral TID AC & HS  . enoxaparin (LOVENOX) injection  40 mg Subcutaneous Q24H  . feeding supplement (ENSURE ENLIVE)  237 mL Oral TID BM  . gabapentin  300 mg Oral BID  . lipase/protease/amylase  72,000 Units Oral TID AC  . loperamide  2 mg Oral See admin instructions  . octreotide  150 mcg Subcutaneous TID  . ondansetron (ZOFRAN) IV  4 mg Intravenous Once  . phosphorus  500 mg Oral TID   Continuous Infusions: . sodium chloride 75 mL/hr at 05/06/19 1531  LOS: 3 days     Enzo Bi, MD [facility] If 7PM-7AM, please contact  night-coverage 05/06/2019, 6:41 PM

## 2019-05-06 NOTE — Consult Note (Signed)
[facility]  Telephone:([phone] [phone]   Name: Melissa [name] Date: 05/06/2019 MRN: [id number]  DOB: 11-08-1940  Patient Care Team: Adin Hector, MD as PCP - General (Internal Medicine) Clent Jacks, RN as Oncology Nurse Navigator    REASON FOR CONSULTATION: Palliative Care consult requested for this 79 y.o. female with multiple medical problems including stage IV pancreatic cancer.  Patient was admitted 10/29/2018-10/31/2018 for ERCP and biliary stenting secondary to obstructive jaundice.  PET scan revealed hypermetabolic activity to retroperitoneal periaortic lymph node, sclerotic lesion of the right proximal femur and left sacrum.  Patient has been managed most recently on second line liposomal irinotecan with 5-FU.  She was admitted to the hospital on 05/02/2019 with intractable diarrhea.  GI work-up was negative for infectious etiology.  Likely symptoms were related to chemotherapy +/-pancreatic insufficiency.  Patient was referred to palliative care to help address goals and manage ongoing symptoms.  SOCIAL HISTORY:     reports that she has never smoked. She has never used smokeless tobacco. She reports that she does not drink alcohol or use drugs.   Patient is married and lives at home with her husband.  She was the caregiver for her 56 year old mother but recently had to move her into an ALF and mother subsequently died 6 days later.  Patient has a daughter who lives next door.  She had another daughter who died at age 65 from complications related to spina bifida.  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  ADVANCE DIRECTIVES:  Not on file.  Daughter is reportedly her healthcare power of attorney.  Patient also has a living will.  CODE STATUS: DNR  PAST MEDICAL HISTORY: Past Medical History:  Diagnosis Date  . Cancer [facility])    pancreatic   . Glaucoma   . Hypertension   .  Osteopenia   . Stroke [facility])     PAST SURGICAL HISTORY:  Past Surgical History:  Procedure Laterality Date  . BREAST BIOPSY Right    core- stereo- neg  . ERCP N/A 10/29/2018   Procedure: ENDOSCOPIC RETROGRADE CHOLANGIOPANCREATOGRAPHY (ERCP);  Surgeon: [name], MD;  Location: [facility] ENDOSCOPY;  Service: Endoscopy;  Laterality: N/A;  . ERCP N/A 01/28/2019   Procedure: ENDOSCOPIC RETROGRADE CHOLANGIOPANCREATOGRAPHY (ERCP) STENT REMOVAL;  Surgeon: [name], MD;  Location: [facility] ENDOSCOPY;  Service: Endoscopy;  Laterality: N/A;  . EUS N/A 11/18/2018   Procedure: FULL UPPER ENDOSCOPIC ULTRASOUND (EUS) RADIAL;  Surgeon: Holly Bodily, MD;  Location: [facility] ENDOSCOPY;  Service: Gastroenterology;  Laterality: N/A;  . HIP ARTHROPLASTY Left 01/02/2016   Procedure: ARTHROPLASTY BIPOLAR HIP (HEMIARTHROPLASTY);  Surgeon: Dereck Leep, MD;  Location: [facility] ORS;  Service: Orthopedics;  Laterality: Left;  . JOINT REPLACEMENT Left    THR  . PORTACATH PLACEMENT Right 11/26/2018   Procedure: INSERTION PORT-A-CATH;  Surgeon: Jules Husbands, MD;  Location: [facility] ORS;  Service: General;  Laterality: Right;  . TONSILLECTOMY      HEMATOLOGY/ONCOLOGY HISTORY:  Oncology History Overview Note  Melissa [name] is a  79 y.o.  female with PMH listed below was seen in consultation at the request of  Adin Hector, MD  for evaluation of abnormal pancreatic disease. Patient was recently seen by primary care provider Dr. Caryl Comes for evaluation of nausea, diarrhea and jaundice.  Blood work on 10/19/2018 showed potassium 2.9, bilirubin 15, alkaline phosphatase 478, AST 114, ALT 95. Abdomen pelvis CT scan on  10/20/2018 showed moderate to market intra-and extra hepatic biliary dilation with mild diffuse dilatation of the pancreatic duct. Patient has had poor appetite and has lost a 5 to 10 pounds recently. She also had diarrhea.  She noticed sand like stool for 1- 2 weeks.  She takes care of her 52 year old mother  and recently feels she is not able to take care of her anymore due to progressively worsening weakness and fatigue.  She placed her mother to assisted living yesterday. She has had discussion with Dr. Caryl Comes about her blood work and CT scans.  She understands that there is a strong suspicion of cancer.  Nonessential medication has been stopped. CA 19-9 was check on 10/21/2018, level was elevated at 1500.   # Patient was admitted from 10/29/2018-10/31/2018.  Status post ERCP and biliary stenting.  Patient had duodenum ampulla biopsy showed nondiagnostic.  Negative for invasive carcinoma. Bilirubin trended down to 11 at the time of discharge on 10/31/2018.   #PET scan images were independent reviewed by me and discussed with patient. cTxN2 M1a Patient has at least M1 a disease due to retroperitoneal/peri-aortic lymph node involvement,  sclerotic lesion of right proximal femur with mild hypermetabolic activity, as well as left sacrum   # EUS biopsy of pancreatic mass showed positive for malignancy, Adenocarcinoma. There is not enough tissue for NGS testing.  Attempt to at MMR, still not enough tissue to complete the testing   Malignant neoplasm of head of pancreas ([place])  11/02/2018 Initial Diagnosis   Malignant neoplasm of head of pancreas [facility])    Chemotherapy   The patient had PACLitaxel-protein bound (ABRAXANE) chemo infusion 200 mg, 115 mg/m2 = 225 mg, Intravenous,  Once, 1 of 5 cycles  Administration: 200 mg (11/30/2018)    gemcitabine (GEMZAR) 1,600 mg in sodium chloride 0.9 % 250 mL chemo infusion, 1,710 mg, Intravenous,  Once, 1 of 5 cycles  Administration: 1,600 mg (11/30/2018)  for chemotherapy treatment.       ALLERGIES:  has No Known Allergies.  MEDICATIONS:  Current Facility-Administered Medications  Medication Dose Route Frequency Provider Last Rate Last Admin  . 0.9 %  sodium chloride infusion   Intravenous Continuous Ivor Costa, MD 75 mL/hr at 05/05/19 2237 New Bag at 05/05/19  2237  . acetaminophen (TYLENOL) tablet 500 mg  500 mg Oral Q6H PRN Ivor Costa, MD      . Chlorhexidine Gluconate Cloth 2 % PADS 6 each  6 each Topical Daily Enzo Bi, MD   6 each at 05/06/19 1015  . dicyclomine (BENTYL) capsule 10 mg  10 mg Oral TID AC & HS Jonathon Bellows, MD   10 mg at 05/06/19 0800  . diphenoxylate-atropine (LOMOTIL) 2.5-0.025 MG per tablet 1-2 tablet  1-2 tablet Oral QID PRN Ivor Costa, MD   1 tablet at 05/06/19 1012  . enoxaparin (LOVENOX) injection 40 mg  40 mg Subcutaneous Q24H Ivor Costa, MD   40 mg at 05/05/19 2328  . feeding supplement (ENSURE ENLIVE) (ENSURE ENLIVE) liquid 237 mL  237 mL Oral TID BM Enzo Bi, MD   237 mL at 05/05/19 1049  . gabapentin (NEURONTIN) capsule 300 mg  300 mg Oral BID Ivor Costa, MD   300 mg at 05/06/19 1012  . hydrALAZINE (APRESOLINE) tablet 25 mg  25 mg Oral TID PRN Ivor Costa, MD      . magic mouthwash  5 mL Oral QID PRN [place] Crane, Scott A, RPH       And  . lidocaine (XYLOCAINE)  2 % viscous mouth solution 5 mL  5 mL Mouth/Throat QID PRN Hart Robinsons A, RPH      . lipase/protease/amylase (CREON) capsule 72,000 Units  72,000 Units Oral TID Maeola Harman, MD   72,000 Units at 05/06/19 0800  . loperamide (IMODIUM) capsule 2 mg  2 mg Oral See admin instructions Ivor Costa, MD   2 mg at 05/03/19 1055  . octreotide (SANDOSTATIN) injection 150 mcg  150 mcg Subcutaneous TID Ivor Costa, MD   150 mcg at 05/06/19 1058  . ondansetron (ZOFRAN) injection 4 mg  4 mg Intravenous Once Ivor Costa, MD      . ondansetron [facility]) injection 4 mg  4 mg Intravenous Q8H PRN Ivor Costa, MD   4 mg at 05/02/19 2029  . oxyCODONE (Oxy IR/ROXICODONE) immediate release tablet 5 mg  5 mg Oral Q6H PRN Ivor Costa, MD   5 mg at 05/05/19 2005  . phosphorus (K PHOS NEUTRAL) tablet 500 mg  500 mg Oral TID Enzo Bi, MD   500 mg at 05/06/19 1012    VITAL SIGNS: BP (!) 124/56 (BP Location: Left Arm)   Pulse 86   Temp 98.8 F (37.1 C) (Oral)   Resp 18   Ht _0  (1.575 m)   Wt  130 lb 4.7 oz (59.1 kg)   SpO2 94%   BMI 23.83 kg/m  Filed Weights   05/02/19 1052 05/02/19 2242  Weight: 124 lb (56.2 kg) 130 lb 4.7 oz (59.1 kg)    Estimated body mass index is 23.83 kg/m as calculated from the following:   Height as of this encounter: _1  (1.575 m).   Weight as of this encounter: 130 lb 4.7 oz (59.1 kg).  LABS: CBC:    Component Value Date/Time   WBC 8.3 05/06/2019 0427   HGB 10.7 (L) 05/06/2019 0427   HCT 31.5 (L) 05/06/2019 0427   PLT 211 05/06/2019 0427   MCV 102.3 (H) 05/06/2019 0427   NEUTROABS 1.3 (L) 05/02/2019 0917   LYMPHSABS 0.2 (L) 05/02/2019 0917   MONOABS 0.6 05/02/2019 0917   EOSABS 0.0 05/02/2019 0917   BASOSABS 0.0 05/02/2019 0917   Comprehensive Metabolic Panel:    Component Value Date/Time   NA 141 05/06/2019 0427   K 3.9 05/06/2019 0427   CL 112 (H) 05/06/2019 0427   CO2 24 05/06/2019 0427   BUN 7 (L) 05/06/2019 0427   CREATININE 0.44 05/06/2019 0427   GLUCOSE 129 (H) 05/06/2019 0427   CALCIUM 6.9 (L) 05/06/2019 0427   AST 17 05/02/2019 0917   ALT 13 05/02/2019 0917   ALKPHOS 241 (H) 05/02/2019 0917   BILITOT 0.8 05/02/2019 0917   PROT 6.1 (L) 05/02/2019 0917   ALBUMIN 2.8 (L) 05/02/2019 0917    RADIOGRAPHIC STUDIES: DG Abd 1 View  Result Date: 05/05/2019 CLINICAL DATA:  Abdominal distension EXAM: ABDOMEN - 1 VIEW COMPARISON:  03/31/2019 FINDINGS: Nonobstructive bowel gas pattern. No radio-opaque calculi or other significant radiographic abnormality are seen. Biliary stent is noted. IMPRESSION: Nonobstructive bowel gas pattern. Electronically Signed   By: Davina Poke D.O.   On: 05/05/2019 15:26    PERFORMANCE STATUS (ECOG) : 3 - Symptomatic, >50% confined to bed  Review of Systems Unless otherwise noted, a complete review of systems is negative.  Physical Exam General: NAD, frail appearing, thin Pulmonary: Unlabored Extremities: no edema, no joint deformities Skin: no rashes Neurological: Weakness but  otherwise nonfocal  IMPRESSION: Patient known to me from the clinic.  I  met with her today to address goals.  She is comfortable appearing in bed.  She says that her diarrhea is improving.  We discussed options for ongoing treatment.  Patient says that she has talked with her family and decided to forego all future work-up or treatment.  Immunotherapy has been discussed as an option, which might be associated with reduced symptom burden but patient says that she is not interested.  She recognizes that this decision might result in reduced life expectancy but she would prefer to focus on quality of life and comfort at home.  Patient verbalized interest in having hospice follow her at home.  Will consult TOC to help arrange hospice services.  PLAN: -Best supportive care -Hospice involvement at home -[facility] consult -RTC as needed  Case and plan discussed with Dr. Tasia Catchings   Time Total: 60 minutes  Visit consisted of counseling and education dealing with the complex and emotionally intense issues of symptom management and palliative care in the setting of serious and potentially life-threatening illness.Greater than 50%  of this time was spent counseling and coordinating care related to the above assessment and plan.  Signed by: Altha Harm, PhD, NP-C

## 2019-05-07 LAB — BASIC METABOLIC PANEL
Anion gap: 4 — ABNORMAL LOW (ref 5–15)
BUN: 8 mg/dL (ref 8–23)
CO2: 25 mmol/L (ref 22–32)
Calcium: 6.4 mg/dL — CL (ref 8.9–10.3)
Chloride: 112 mmol/L — ABNORMAL HIGH (ref 98–111)
Creatinine, Ser: 0.48 mg/dL (ref 0.44–1.00)
GFR calc Af Amer: >60 mL/min (ref >60)
GFR calc non Af Amer: >60 mL/min (ref >60)
Glucose, Bld: 142 mg/dL — ABNORMAL HIGH (ref 70–99)
Potassium: 3.8 mmol/L (ref 3.5–5.1)
Sodium: 141 mmol/L (ref 135–145)

## 2019-05-07 LAB — CBC
HCT: 30.3 % — ABNORMAL LOW (ref 36.0–46.0)
Hemoglobin: 10 g/dL — ABNORMAL LOW (ref 12.0–15.0)
MCH: 34.5 pg — ABNORMAL HIGH (ref 26.0–34.0)
MCHC: 33 g/dL (ref 30.0–36.0)
MCV: 104.5 fL — ABNORMAL HIGH (ref 80.0–100.0)
Platelets: 178 10*3/uL (ref 150–400)
RBC: 2.9 MIL/uL — ABNORMAL LOW (ref 3.87–5.11)
RDW: 16.8 % — ABNORMAL HIGH (ref 11.5–15.5)
WBC: 7.4 10*3/uL (ref 4.0–10.5)
nRBC: 0.4 % — ABNORMAL HIGH (ref 0.0–0.2)

## 2019-05-07 LAB — GLUCOSE, CAPILLARY: Glucose-Capillary: 136 mg/dL — ABNORMAL HIGH (ref 70–99)

## 2019-05-07 LAB — MAGNESIUM: Magnesium: 1.8 mg/dL (ref 1.7–2.4)

## 2019-05-07 LAB — CANCER ANTIGEN 19-9: CA 19-9: 3070 U/mL — ABNORMAL HIGH (ref 0–35)

## 2019-05-07 LAB — PHOSPHORUS: Phosphorus: 2.5 mg/dL (ref 2.5–4.6)

## 2019-05-07 MED ORDER — CALCIUM GLUCONATE-NACL 1-0.675 GM/50ML-% IV SOLN
1.0000 g | Freq: Once | INTRAVENOUS | Status: AC
  Administered 2019-05-07: 1000 mg via INTRAVENOUS
  Filled 2019-05-07: qty 50

## 2019-05-07 NOTE — Progress Notes (Signed)
PROGRESS NOTE    Melissa [name]  [id number] DOB: Nov 14, 1940 DOA: 05/02/2019 PCP: Adin Hector, MD    Assessment & Plan:   Principal Problem:   Nausea vomiting and diarrhea Active Problems:   Malignant neoplasm of head of pancreas ([place])   History of CVA (cerebrovascular accident)   Hypertension   Hypokalemia   Hypomagnesemia   Dehydration   Intractable diarrhea   Abdominal distension, gaseous   Hypophosphatemia   Palliative care encounter    Melissa [name] is a 79 y.o. Caucasian female with medical history significant of hypertension, stroke, pancreatic cancer on chemotherapy, glaucoma, who presented with diarrhea, nausea, vomiting.   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 difficile PCR on 2/12.  GI path neg. -IV fluid: 1 L normal saline, followed by 75 cc/h. PLAN: -As needed Zofran for nausea --Lomotil - continue octreotide 150 mg 3 times daily per Dr. Tasia Catchings -continue Bentyl, added for abdominal cramps. --continue Creon, by GI --d/c MIVF today in preparation of pt going home.  #Mucositis, continue Magic mouthwash.  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 Tasia Catchings of oncology is consulted --Pt had palliative consult and decided to go home with hospice.  History of CVA (cerebrovascular accident): -not taking meds now. No acute issues  HTN:  -hold amlodipine due to dehydration and normal blood pressure (128/66) -hydralazine prn  Dehydration: -s/p IVF as above --d/c MIVF today in preparation of pt going home.   DVT prophylaxis: Lovenox SQ Code Status: DNR  Family Communication: husband updated at bedside Disposition Plan: home with home hospice after diarrhea improves and electrolytes stable without needing repletion.  Likely  tomorrow.  Subjective and Interval History:  Pt reported only 1 episode of BM, which had started to thicken.  No fever, dyspnea, chest pain, N/V.     Objective: Vitals:   05/06/19 1420 05/06/19 2320 05/06/19 2350 05/07/19 1016  BP: 124/66 129/73  125/73  Pulse: 91 (!) 112 85 (!) 103  Resp: 18 18  16   Temp: 98.9 F (37.2 C) 97.7 F (36.5 C)    TempSrc: Oral Oral    SpO2: 94% 94% 98% 95%  Weight:      Height:        Intake/Output Summary (Last 24 hours) at 05/07/2019 1518 Last data filed at 05/07/2019 1313 Gross per 24 hour  Intake 410 ml  Output --  Net 410 ml   Filed Weights   05/02/19 1052 05/02/19 2242  Weight: 56.2 kg 59.1 kg    Examination:   Constitutional: NAD, AAOx3 HEENT: conjunctivae and lids normal, EOMI CV: RRR no M,R,G. Distal pulses +2.  No cyanosis.   RESP: CTA B/L, normal respiratory effort  GI:  Hyperactive BS, ND Extremities: No effusions, edema, or tenderness in BLE SKIN: warm, dry and intact Neuro: II - XII grossly intact.  Sensation intact Psych: Depressed mood and affect.  Appropriate judgement and reason   Data Reviewed: I have personally reviewed following labs and imaging studies  CBC: Recent Labs  Lab 05/02/19 0917 05/03/19 0331 05/05/19 0331 05/06/19 0427 05/07/19 0312  WBC 2.2* 2.9* 4.8 8.3 7.4  NEUTROABS 1.3*  --   --   --   --   HGB 10.8* 10.2* 9.8* 10.7* 10.0*  HCT 32.5* 29.9* 29.7* 31.5* 30.3*  MCV 102.2* 101.4* 102.4*  102.3* 104.5*  PLT 288 267 227 211 0000000   Basic Metabolic Panel: Recent Labs  Lab 05/02/19 0917 05/03/19 0046 05/03/19 0331 05/03/19 1847 05/04/19 0341 05/05/19 0331 05/05/19 1642 05/06/19 0427 05/07/19 0312  NA   < >  --  141  --  140 139  --  141 141  K   < >  --  2.6*   < > 3.6 2.5* 2.6* 3.9 3.8  CL   < >  --  109  --  109 108  --  112* 112*  CO2   < >  --  20*  --  20* 24  --  24 25  GLUCOSE   < >  --  83  --  151* 167*  --  129* 142*  BUN   < >  --  8  --  7* 7*  --  7* 8  CREATININE   < >   --  0.55  --  0.66 0.53  --  0.44 0.48  CALCIUM   < >  --  7.3*  --  7.0* 6.8*  --  6.9* 6.4*  MG   < >  --  2.6*  --  2.2 1.6* 1.8 1.8 1.8  PHOS  --  1.9*  --   --  2.3* 2.5  --  1.5* 2.5   < > = values in this interval not displayed.   GFR: Estimated Creatinine Clearance: 46.6 mL/min (by C-G formula based on SCr of 0.48 mg/dL). Liver Function Tests: Recent Labs  Lab 05/02/19 0917  AST 17  ALT 13  ALKPHOS 241*  BILITOT 0.8  PROT 6.1*  ALBUMIN 2.8*   No results for input(s): LIPASE, AMYLASE in the last 168 hours. No results for input(s): AMMONIA in the last 168 hours. Coagulation Profile: No results for input(s): INR, PROTIME in the last 168 hours. Cardiac Enzymes: No results for input(s): CKTOTAL, CKMB, CKMBINDEX, TROPONINI in the last 168 hours. BNP (last 3 results) No results for input(s): PROBNP in the last 8760 hours. HbA1C: No results for input(s): HGBA1C in the last 72 hours. CBG: No results for input(s): GLUCAP in the last 168 hours. Lipid Profile: No results for input(s): CHOL, HDL, LDLCALC, TRIG, CHOLHDL, LDLDIRECT in the last 72 hours. Thyroid Function Tests: No results for input(s): TSH, T4TOTAL, FREET4, T3FREE, THYROIDAB in the last 72 hours. Anemia Panel: No results for input(s): VITAMINB12, FOLATE, FERRITIN, TIBC, IRON, RETICCTPCT in the last 72 hours. Sepsis Labs: No results for input(s): PROCALCITON, LATICACIDVEN in the last 168 hours.  Recent Results (from the past 240 hour(s))  C difficile quick screen w PCR reflex     Status: None   Collection Time: 04/29/19  1:30 PM   Specimen: STOOL  Result Value Ref Range Status   C Diff antigen NEGATIVE NEGATIVE Final   C Diff toxin NEGATIVE NEGATIVE Final   C Diff interpretation No C. difficile detected.  Final    Comment: Performed at [facility], [place], [place] [address]  SARS CORONAVIRUS 2 (TAT 6-24 HRS) Nasopharyngeal Nasopharyngeal Swab     Status: None   Collection Time:  05/02/19  3:26 PM   Specimen: Nasopharyngeal Swab  Result Value Ref Range Status   SARS Coronavirus 2 NEGATIVE NEGATIVE Final    Comment: (NOTE) SARS-CoV-2 target nucleic acids are NOT DETECTED. The SARS-CoV-2 RNA is generally detectable in upper and lower respiratory specimens during the acute phase of infection. Negative results do not  preclude SARS-CoV-2 infection, do not rule out co-infections with other pathogens, and should not be used as the sole basis for treatment or other patient management decisions. Negative results must be combined with clinical observations, patient history, and epidemiological information. The expected result is Negative. Fact Sheet for Patients: SugarRoll.be Fact Sheet for Healthcare Providers: https://www.woods-mathews.com/ This test is not yet approved or cleared by the [place] FDA and  has been authorized for detection and/or diagnosis of SARS-CoV-2 by FDA under an Emergency Use Authorization (EUA). This EUA will remain  in effect (meaning this test can be used) for the duration of the COVID-19 declaration under Section 56 4(b)(1) of the Act, 21 U.S.C. section 360bbb-3(b)(1), unless the authorization is terminated or revoked sooner. Performed at [facility], [place] [address], [place], [place] [address]   GI pathogen panel by PCR, stool     Status: None   Collection Time: 05/03/19  1:54 PM   Specimen: Stool  Result Value Ref Range Status   Plesiomonas shigelloides NOT DETECTED NOT DETECTED Final   Yersinia enterocolitica NOT DETECTED NOT DETECTED Final   Vibrio NOT DETECTED NOT DETECTED Final   Enteropathogenic E coli NOT DETECTED NOT DETECTED Final   E coli (ETEC) LT/ST NOT DETECTED NOT DETECTED Final   E coli [id number] by PCR Not applicable NOT DETECTED Final   Cryptosporidium by PCR NOT DETECTED NOT DETECTED Final   Entamoeba histolytica NOT DETECTED NOT DETECTED Final   Adenovirus F 40/41 NOT  DETECTED NOT DETECTED Final   Norovirus GI/GII NOT DETECTED NOT DETECTED Final   Sapovirus NOT DETECTED NOT DETECTED Final    Comment: (NOTE) Performed At: [facility] [place], [place] [id number] Rush Farmer MD [id number]    Vibrio cholerae NOT DETECTED NOT DETECTED Final   Campylobacter by PCR NOT DETECTED NOT DETECTED Final   Salmonella by PCR NOT DETECTED NOT DETECTED Final   E coli (STEC) NOT DETECTED NOT DETECTED Final   Enteroaggregative E coli NOT DETECTED NOT DETECTED Final   Shigella by PCR NOT DETECTED NOT DETECTED Final   Cyclospora cayetanensis NOT DETECTED NOT DETECTED Final   Astrovirus NOT DETECTED NOT DETECTED Final   G lamblia by PCR NOT DETECTED NOT DETECTED Final   Rotavirus A by PCR NOT DETECTED NOT DETECTED Final      Radiology Studies: No results found.   Scheduled Meds: . Chlorhexidine Gluconate Cloth  6 each Topical Daily  . dicyclomine  10 mg Oral TID AC & HS  . enoxaparin (LOVENOX) injection  40 mg Subcutaneous Q24H  . feeding supplement (ENSURE ENLIVE)  237 mL Oral TID BM  . gabapentin  300 mg Oral BID  . lipase/protease/amylase  72,000 Units Oral TID AC  . loperamide  2 mg Oral See admin instructions  . octreotide  150 mcg Subcutaneous TID  . ondansetron (ZOFRAN) IV  4 mg Intravenous Once  . phosphorus  500 mg Oral TID   Continuous Infusions:    LOS: 4 days     Enzo Bi, MD [facility] If 7PM-7AM, please contact night-coverage 05/07/2019, 3:18 PM

## 2019-05-07 NOTE — Progress Notes (Signed)
CRITICAL VALUE ALERT  Critical Value:  Calcium 6.4  Date & Time Notied:  05/07/2019 1718  Provider Notified: Rufina Falco, NP  Orders Received/Actions taken: No further orders received

## 2019-05-08 LAB — CBC
HCT: 31.4 % — ABNORMAL LOW (ref 36.0–46.0)
Hemoglobin: 10.6 g/dL — ABNORMAL LOW (ref 12.0–15.0)
MCH: 35.1 pg — ABNORMAL HIGH (ref 26.0–34.0)
MCHC: 33.8 g/dL (ref 30.0–36.0)
MCV: 104 fL — ABNORMAL HIGH (ref 80.0–100.0)
Platelets: 174 10*3/uL (ref 150–400)
RBC: 3.02 MIL/uL — ABNORMAL LOW (ref 3.87–5.11)
RDW: 16.5 % — ABNORMAL HIGH (ref 11.5–15.5)
WBC: 8.4 10*3/uL (ref 4.0–10.5)
nRBC: 0 % (ref 0.0–0.2)

## 2019-05-08 LAB — BASIC METABOLIC PANEL
Anion gap: 6 (ref 5–15)
BUN: 10 mg/dL (ref 8–23)
CO2: 26 mmol/L (ref 22–32)
Calcium: 6.5 mg/dL — ABNORMAL LOW (ref 8.9–10.3)
Chloride: 107 mmol/L (ref 98–111)
Creatinine, Ser: 0.53 mg/dL (ref 0.44–1.00)
GFR calc Af Amer: >60 mL/min (ref >60)
GFR calc non Af Amer: >60 mL/min (ref >60)
Glucose, Bld: 122 mg/dL — ABNORMAL HIGH (ref 70–99)
Potassium: 3.5 mmol/L (ref 3.5–5.1)
Sodium: 139 mmol/L (ref 135–145)

## 2019-05-08 LAB — MAGNESIUM: Magnesium: 1.7 mg/dL (ref 1.7–2.4)

## 2019-05-08 LAB — PHOSPHORUS: Phosphorus: 3.7 mg/dL (ref 2.5–4.6)

## 2019-05-08 MED ORDER — DICYCLOMINE HCL 10 MG PO CAPS: 10.0000 mg | ORAL_CAPSULE | Freq: Three times a day (TID) | ORAL | 2 refills | Status: AC

## 2019-05-08 MED ORDER — CALCIUM GLUCONATE-NACL 2-0.675 GM/100ML-% IV SOLN
2.0000 g | Freq: Once | INTRAVENOUS | Status: AC
  Administered 2019-05-08: 2000 mg via INTRAVENOUS
  Filled 2019-05-08: qty 100

## 2019-05-08 MED ORDER — PANCRELIPASE (LIP-PROT-AMYL) 36000-114000 UNITS PO CPEP: 72000.0000 [IU] | ORAL_CAPSULE | Freq: Three times a day (TID) | ORAL | 2 refills | Status: AC

## 2019-05-08 MED ORDER — HEPARIN SOD (PORK) LOCK FLUSH 100 UNIT/ML IV SOLN
500.0000 [IU] | INTRAVENOUS | Status: AC | PRN
  Administered 2019-05-08: 500 [IU]
  Filled 2019-05-08: qty 5

## 2019-05-08 MED ORDER — SODIUM CHLORIDE 0.9% FLUSH
10.0000 mL | INTRAVENOUS | Status: AC | PRN
  Administered 2019-05-08: 10 mL

## 2019-05-08 NOTE — TOC Transition Note (Signed)
Transition of Care [facility]) - CM/SW Discharge Note   Patient Details  Name: Melissa [name] MRN: [id number] Date of Birth: [birth year]  Transition of Care [facility]) CM/SW Contact:  Boris Sharper, LCSW Phone [phone] 05/08/2019, 11:22 AM   Clinical Narrative:    Pt medically stable for discharge. Pt's husband will be transporting her home. CSW spoke with pt and pt expressed that she needs a BSC. CSW notified Brad with [employer] and provided pt with BSC. CSW notified April with [facility] of pt's discharge.   Final next level of care: Home w Home Health Services Barriers to Discharge: No Barriers Identified   Patient Goals and CMS Choice Patient states their goals for this hospitalization and ongoing recovery are:: to get stronger   Choice offered to / list presented to : NA  Discharge Placement                Patient to be transferred to facility by: Husband Name of family member notified: Sonia Side Patient and family notified of of transfer: 05/08/19  Discharge Plan and Services In-house Referral: Clinical Social Work              DME Arranged: Engineer, civil (consulting) DME Agency: [facility] Date DME Agency Contacted: 05/08/19 Time DME Agency Contacted: 83 Representative spoke with at DME Agency: [place]: Other - See comment([facility]) Date [place]: 05/08/19 Time [place]: 1122 Representative spoke with at [place]: April  Social Determinants of Health (SDOH) Interventions     Readmission Risk Interventions No flowsheet data found.

## 2019-05-08 NOTE — Plan of Care (Signed)
  Problem: Education: Goal: Knowledge of General Education information will improve Description Including pain rating scale, medication(s)/side effects and non-pharmacologic comfort measures Outcome: Progressing   Problem: Health Behavior/Discharge Planning: Goal: Ability to manage health-related needs will improve Outcome: Progressing   Problem: Clinical Measurements: Goal: Ability to maintain clinical measurements within normal limits will improve Outcome: Progressing Goal: Will remain free from infection Outcome: Progressing Goal: Diagnostic test results will improve Outcome: Progressing Goal: Respiratory complications will improve Outcome: Progressing Goal: Cardiovascular complication will be avoided Outcome: Progressing   Problem: Nutrition: Goal: Adequate nutrition will be maintained Outcome: Progressing   Problem: Skin Integrity: Goal: Risk for impaired skin integrity will decrease Outcome: Progressing   

## 2019-05-08 NOTE — Discharge Summary (Signed)
Physician Discharge Summary   Melissa [name]  female DOB: 01-Jun-1940  [id number]  PCP: Adin Hector, MD  Admit date: 05/02/2019 Discharge date: 05/08/2019  Admitted From: home Disposition:  Home with home hospice CODE STATUS: DNR  Discharge Instructions    Diet - low sodium heart healthy   Complete by: As directed    Increase activity slowly   Complete by: As directed        Hospital Course:  For full details, please see H&P, progress notes, consult notes and ancillary notes.  Briefly,  Melissa [name] a 79 y.o.Caucasian femalewith medical history significant ofhypertension, stroke, pancreatic cancer on chemotherapy, glaucoma, who presented with diarrhea, nausea,vomiting.   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 difficile PCR on 2/12. GI path neg.  Pt received supportive treatment with continuous MIVF, PRN Zofran, Lomotil.  Pt also received octreotide 150 mg 3 times daily per Dr.Yu oncology.  GI was consulted who recommended Bentyl for abdominalcramps and Creon.  Diarrhea started to slow down 2-3 days prior to discharge.  MIVF was stopped 1 day prior to discharge to ensure pt can maintain hydration and stable kidney function without IVF.  Hypokalemia hypomagnesemia: Hypophos Hypcal Due to profuse watery diarrhea, pt was loosing electrolytes and required aggressive repletion with IV and oral potassium, mag, phos and Ca gluconate.  On the day of discharge, pt's potassium had been stable and wnl for 2 days without needing repletion.    #Mucositis,  continued Magic mouthwash.  Metastatic Malignant neoplasm of head of pancreas ([facility]):on chemotherapy Oncology Dr.Yusaw pt throughout the hospitalization.  Pt had palliative consult and decided to go home with hospice.  History of CVA (cerebrovascular accident): Not taking meds now. No acute issues  HTN:  Home  amlodipine held during hospitalization due to dehydration and normal blood pressure.     Discharge Diagnoses:  Principal Problem:   Nausea vomiting and diarrhea Active Problems:   Malignant neoplasm of head of pancreas ([facility])   History of CVA (cerebrovascular accident)   Hypertension   Hypokalemia   Hypomagnesemia   Dehydration   Intractable diarrhea   Abdominal distension, gaseous   Hypophosphatemia   Palliative care encounter    Discharge Instructions:  Allergies as of 05/08/2019   No Known Allergies     Medication List    STOP taking these medications   lidocaine-prilocaine cream Commonly known as: EMLA   loperamide 2 MG tablet Commonly known as: Imodium A-D   octreotide 100 MCG/ML Soln injection Commonly known as: SANDOSTATIN   prochlorperazine 10 MG tablet Commonly known as: COMPAZINE     TAKE these medications   acetaminophen 500 MG tablet Commonly known as: TYLENOL Take 500 mg by mouth every 6 (six) hours as needed.   amLODipine 10 MG tablet Commonly known as: NORVASC Take 10 mg by mouth at bedtime.   dexamethasone 4 MG tablet Commonly known as: DECADRON Take 2 tablets (8 mg total) by mouth daily. Start the day after irinotecan chemotherapy for 2 days.   dicyclomine 10 MG capsule Commonly known as: BENTYL Take 1 capsule (10 mg total) by mouth 4 (four) times daily -  before meals and at bedtime.   diphenoxylate-atropine 2.5-0.025 MG tablet Commonly known as: LOMOTIL Take 1-2 tablets by mouth 4 (four) times daily as needed for diarrhea or loose stools.   gabapentin 300 MG capsule Commonly known as: NEURONTIN Take 1 capsule (300 mg total) by  mouth 2 (two) times daily.   lipase/protease/amylase 36000 UNITS Cpep capsule Commonly known as: CREON Take 2 capsules (72,000 Units total) by mouth 3 (three) times daily before meals.   magic mouthwash w/lidocaine Soln Take 5 mLs by mouth 4 (four) times daily as needed for mouth pain. Sig: Swish/Swallow  5-10 ml four times a day as needed. Dispense 480 ml. 1RF   nystatin 100000 UNIT/ML suspension Commonly known as: MYCOSTATIN Take 5 mLs (500,000 Units total) by mouth 4 (four) times daily.   ondansetron 8 MG tablet Commonly known as: ZOFRAN Take 1 tablet by mouth as needed.   oxyCODONE 5 MG immediate release tablet Commonly known as: Roxicodone Take 1 tablet (5 mg total) by mouth every 6 (six) hours as needed for moderate pain or severe pain.   potassium chloride SA 20 MEQ tablet Commonly known as: KLOR-CON Take 1 tablet (20 mEq total) by mouth daily.   valACYclovir 500 MG tablet Commonly known as: VALTREX Take 1 tablet (500 mg total) by mouth 2 (two) times daily.            Durable Medical Equipment  (From admission, onward)         Start     Ordered   05/03/19 0942  For home use only DME Walker rolling  Once    Question Answer Comment  Walker: With 5 Inch Wheels   Patient needs a walker to treat with the following condition Unsteady gait when walking      05/03/19 0941          Follow-up Information    Tama High III, MD. Schedule an appointment as soon as possible for a visit in 1 week(s).   Specialty: Internal Medicine Contact information: [place] [address] [phone]           No Known Allergies   The results of significant diagnostics from this hospitalization (including imaging, microbiology, ancillary and laboratory) are listed below for reference.   Consultations:   Procedures/Studies: DG Abd 1 View  Result Date: 05/05/2019 CLINICAL DATA:  Abdominal distension EXAM: ABDOMEN - 1 VIEW COMPARISON:  03/31/2019 FINDINGS: Nonobstructive bowel gas pattern. No radio-opaque calculi or other significant radiographic abnormality are seen. Biliary stent is noted. IMPRESSION: Nonobstructive bowel gas pattern. Electronically Signed   By: Davina Poke D.O.   On: 05/05/2019 15:26      Labs: BNP  (last 3 results) No results for input(s): BNP in the last 8760 hours. Basic Metabolic Panel: Recent Labs  Lab 05/04/19 0341 05/04/19 0341 05/05/19 0331 05/05/19 1642 05/06/19 0427 05/07/19 0312 05/08/19 0627  NA 140  --  139  --  141 141 139  K 3.6   < > 2.5* 2.6* 3.9 3.8 3.5  CL 109  --  108  --  112* 112* 107  CO2 20*  --  24  --  24 25 26   GLUCOSE 151*  --  167*  --  129* 142* 122*  BUN 7*  --  7*  --  7* 8 10  CREATININE 0.66  --  0.53  --  0.44 0.48 0.53  CALCIUM 7.0*  --  6.8*  --  6.9* 6.4* 6.5*  MG 2.2   < > 1.6* 1.8 1.8 1.8 1.7  PHOS 2.3*  --  2.5  --  1.5* 2.5 3.7   < > = values in this interval not displayed.   Liver Function Tests: Recent Labs  Lab 05/02/19  0917  AST 17  ALT 13  ALKPHOS 241*  BILITOT 0.8  PROT 6.1*  ALBUMIN 2.8*   No results for input(s): LIPASE, AMYLASE in the last 168 hours. No results for input(s): AMMONIA in the last 168 hours. CBC: Recent Labs  Lab 05/02/19 0917 05/02/19 0917 05/03/19 0331 05/05/19 0331 05/06/19 0427 05/07/19 0312 05/08/19 0627  WBC 2.2*   < > 2.9* 4.8 8.3 7.4 8.4  NEUTROABS 1.3*  --   --   --   --   --   --   HGB 10.8*   < > 10.2* 9.8* 10.7* 10.0* 10.6*  HCT 32.5*   < > 29.9* 29.7* 31.5* 30.3* 31.4*  MCV 102.2*   < > 101.4* 102.4* 102.3* 104.5* 104.0*  PLT 288   < > 267 227 211 178 174   < > = values in this interval not displayed.   Cardiac Enzymes: No results for input(s): CKTOTAL, CKMB, CKMBINDEX, TROPONINI in the last 168 hours. BNP: Invalid input(s): POCBNP CBG: Recent Labs  Lab 05/07/19 2106  GLUCAP 136*   D-Dimer No results for input(s): DDIMER in the last 72 hours. Hgb A1c No results for input(s): HGBA1C in the last 72 hours. Lipid Profile No results for input(s): CHOL, HDL, LDLCALC, TRIG, CHOLHDL, LDLDIRECT in the last 72 hours. Thyroid function studies No results for input(s): TSH, T4TOTAL, T3FREE, THYROIDAB in the last 72 hours.  Invalid input(s): FREET3 Anemia work up No results  for input(s): VITAMINB12, FOLATE, FERRITIN, TIBC, IRON, RETICCTPCT in the last 72 hours. Urinalysis No results found for: COLORURINE, APPEARANCEUR, [place], [place], [place], [place], [place], [place], PROTEINUR, UROBILINOGEN, NITRITE, LEUKOCYTESUR Sepsis Labs Invalid input(s): PROCALCITONIN,  WBC,  LACTICIDVEN Microbiology Recent Results (from the past 240 hour(s))  C difficile quick screen w PCR reflex     Status: None   Collection Time: 04/29/19  1:30 PM   Specimen: STOOL  Result Value Ref Range Status   C Diff antigen NEGATIVE NEGATIVE Final   C Diff toxin NEGATIVE NEGATIVE Final   C Diff interpretation No C. difficile detected.  Final    Comment: Performed at [facility], [place], [place] [address]  SARS CORONAVIRUS 2 (TAT 6-24 HRS) Nasopharyngeal Nasopharyngeal Swab     Status: None   Collection Time: 05/02/19  3:26 PM   Specimen: Nasopharyngeal Swab  Result Value Ref Range Status   SARS Coronavirus 2 NEGATIVE NEGATIVE Final    Comment: (NOTE) SARS-CoV-2 target nucleic acids are NOT DETECTED. The SARS-CoV-2 RNA is generally detectable in upper and lower respiratory specimens during the acute phase of infection. Negative results do not preclude SARS-CoV-2 infection, do not rule out co-infections with other pathogens, and should not be used as the sole basis for treatment or other patient management decisions. Negative results must be combined with clinical observations, patient history, and epidemiological information. The expected result is Negative. Fact Sheet for Patients: SugarRoll.be Fact Sheet for Healthcare Providers: https://www.woods-mathews.com/ This test is not yet approved or cleared by the [place] FDA and  has been authorized for detection and/or diagnosis of SARS-CoV-2 by FDA under an Emergency Use Authorization (EUA). This EUA will remain  in effect (meaning this test can be used)  for the duration of the COVID-19 declaration under Section 56 4(b)(1) of the Act, 21 U.S.C. section 360bbb-3(b)(1), unless the authorization is terminated or revoked sooner. Performed at [facility], [place] [address] [place], [place] [address]   GI pathogen panel by PCR, stool  Status: None   Collection Time: 05/03/19  1:54 PM   Specimen: Stool  Result Value Ref Range Status   Plesiomonas shigelloides NOT DETECTED NOT DETECTED Final   Yersinia enterocolitica NOT DETECTED NOT DETECTED Final   Vibrio NOT DETECTED NOT DETECTED Final   Enteropathogenic E coli NOT DETECTED NOT DETECTED Final   E coli (ETEC) LT/ST NOT DETECTED NOT DETECTED Final   E coli [id number] by PCR Not applicable NOT DETECTED Final   Cryptosporidium by PCR NOT DETECTED NOT DETECTED Final   Entamoeba histolytica NOT DETECTED NOT DETECTED Final   Adenovirus F 40/41 NOT DETECTED NOT DETECTED Final   Norovirus GI/GII NOT DETECTED NOT DETECTED Final   Sapovirus NOT DETECTED NOT DETECTED Final    Comment: (NOTE) Performed At: [facility] [place], [place] [id number] Rush Farmer MD [id number]    Vibrio cholerae NOT DETECTED NOT DETECTED Final   Campylobacter by PCR NOT DETECTED NOT DETECTED Final   Salmonella by PCR NOT DETECTED NOT DETECTED Final   E coli (STEC) NOT DETECTED NOT DETECTED Final   Enteroaggregative E coli NOT DETECTED NOT DETECTED Final   Shigella by PCR NOT DETECTED NOT DETECTED Final   Cyclospora cayetanensis NOT DETECTED NOT DETECTED Final   Astrovirus NOT DETECTED NOT DETECTED Final   G lamblia by PCR NOT DETECTED NOT DETECTED Final   Rotavirus A by PCR NOT DETECTED NOT DETECTED Final     Total time spend on discharging this patient, including the last patient exam, discussing the hospital stay, instructions for ongoing care as it relates to all pertinent caregivers, as well as preparing the medical discharge records, prescriptions, and/or referrals as applicable,  is 30 minutes.    Enzo Bi, MD  [facility] 05/08/2019, 9:01 AM  If 7PM-7AM, please contact night-coverage

## 2019-05-08 NOTE — Discharge Instructions (Signed)
[facility]. [employer] for durable medical equipment (bedside commode).

## 2019-05-08 NOTE — Plan of Care (Signed)
  Problem: Education: Goal: Knowledge of General Education information will improve Description: Including pain rating scale, medication(s)/side effects and non-pharmacologic comfort measures Outcome: Adequate for Discharge   Problem: Health Behavior/Discharge Planning: Goal: Ability to manage health-related needs will improve Outcome: Adequate for Discharge   Problem: Clinical Measurements: Goal: Ability to maintain clinical measurements within normal limits will improve Outcome: Adequate for Discharge Goal: Will remain free from infection Outcome: Adequate for Discharge Goal: Diagnostic test results will improve Outcome: Adequate for Discharge Goal: Respiratory complications will improve Outcome: Adequate for Discharge Goal: Cardiovascular complication will be avoided Outcome: Adequate for Discharge   Problem: Nutrition: Goal: Adequate nutrition will be maintained Outcome: Adequate for Discharge   Problem: Skin Integrity: Goal: Risk for impaired skin integrity will decrease Outcome: Adequate for Discharge

## 2019-05-08 NOTE — Progress Notes (Signed)
MD order received in [facility] to discharge pt home today; TOC previously established home health with [facility]; DME rolling walker and 3 in 1 bedside commode delivered to the pt's room; pt discharged via wheelchair by nursing to the medical mall entrance

## 2019-05-09 LAB — MISC LABCORP TEST (SEND OUT): Labcorp test code: 511200

## 2019-05-11 ENCOUNTER — Telehealth: Payer: Self-pay | Admitting: *Deleted

## 2019-05-11 ENCOUNTER — Encounter: Payer: Self-pay | Admitting: Gastroenterology

## 2019-05-11 LAB — CALPROTECTIN, FECAL: Calprotectin, Fecal: 750 ug/g — ABNORMAL HIGH (ref 0–120)

## 2019-05-11 NOTE — Telephone Encounter (Signed)
Mitzi with hospice called stating patient is out of her Oxycodone and is asking if patient could instead have fentanyl patch for her pain control because of her diarrhea problems, she requests a call to discuss this [phone]

## 2019-05-12 ENCOUNTER — Inpatient Hospital Stay: Payer: Medicare Other

## 2019-05-12 ENCOUNTER — Other Ambulatory Visit: Payer: Self-pay | Admitting: Hospice and Palliative Medicine

## 2019-05-12 MED ORDER — OXYCODONE HCL 5 MG PO TABS: 5.0000 mg | ORAL_TABLET | ORAL | 0 refills | Status: DC | PRN

## 2019-05-12 NOTE — Telephone Encounter (Signed)
I spoke with hospice nurse. Orders entered.  

## 2019-05-12 NOTE — Progress Notes (Signed)
Spoke with patient's hospice nurse.  Patient's pain has been poorly controlled at home.  Hospice started her on a 12 mcg fentanyl patch yesterday.  They are requesting refill of her oxycodone.  Patient has continued to have poorly controlled diarrhea.  Unfortunately, patient's Creon was not covered under the hospice formulary.  However, hospice is coordinating getting her that medication through Medicare.  I suspect that her diarrhea is likely due to the discontinuation of the Creon.

## 2019-05-13 ENCOUNTER — Inpatient Hospital Stay (HOSPITAL_BASED_OUTPATIENT_CLINIC_OR_DEPARTMENT_OTHER): Payer: Medicare Other | Admitting: Hospice and Palliative Medicine

## 2019-05-13 ENCOUNTER — Other Ambulatory Visit: Payer: Self-pay

## 2019-05-13 DIAGNOSIS — Z515 Encounter for palliative care: Secondary | ICD-10-CM

## 2019-05-13 NOTE — Progress Notes (Signed)
Patient was scheduled for a virtual visit today.  However, she is now being followed by hospice at home.  I did speak with her hospice nurse by phone.  We discussed increasing opioids to manage patient's pain.  [facility] has been [facility] on [facility].

## 2019-05-16 ENCOUNTER — Telehealth: Payer: Self-pay | Admitting: Oncology

## 2019-05-16 ENCOUNTER — Telehealth: Payer: Self-pay | Admitting: Hospice and Palliative Medicine

## 2019-05-16 ENCOUNTER — Other Ambulatory Visit: Payer: Self-pay | Admitting: Oncology

## 2019-05-16 NOTE — Telephone Encounter (Signed)
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  I attempted to call patient 3 times and her phone line was busy.  I called patient's daughter at [phone] and talked to patient's Daughter [name].  Updated her about patient's test results and I recommend patient's first-degree relatives be screened for iron level and hemochromatosis gene.  Daughter appreciates update and will relay information to patient.

## 2019-05-16 NOTE — Telephone Encounter (Signed)
I spoke with patient's hospice nurse. Patient has complained of progressive bilat LE edema since discharging from the hospital. Likely secondary to severe protein calorie malnutrition in setting of end stage CA. Nurse was requesting diuretics. Discussed risks (falls, dehydration, etc) vs benefits. Also discussed conservative management (elevating, compression, etc). Okay to try Lasix 10mg  po prn for a few days but would d/c if no improvement. Patient also complaining of numbness/tingling. Known peripheral neuropathy but patient is out of gabapentin. Nurse will call in a refill.

## 2019-05-23 ENCOUNTER — Telehealth: Payer: Self-pay | Admitting: *Deleted

## 2019-05-23 ENCOUNTER — Ambulatory Visit: Payer: Medicare Other | Admitting: Radiation Oncology

## 2019-05-23 NOTE — Telephone Encounter (Signed)
Patient needing a refill of her Fentanyl 12 mcg, but Mitzi requesting that dose of Fentanyl be increased or her Oxycodone  If not Fentanyl as she is using 5 Oxy 5 mg tabs per day. She also reports that patient had a small bowel movement yesterday and would like to discuss this with you before calling in anything fer her since she is on creon for her diarrhea.Please advise

## 2019-05-24 ENCOUNTER — Other Ambulatory Visit: Payer: Self-pay | Admitting: Hospice and Palliative Medicine

## 2019-05-24 MED ORDER — OXYCODONE HCL 5 MG PO TABS: 5.0000 mg | ORAL_TABLET | ORAL | 0 refills | Status: AC | PRN

## 2019-05-24 MED ORDER — FENTANYL 25 MCG/HR TD PT72: 1.0000 | MEDICATED_PATCH | TRANSDERMAL | 0 refills | Status: AC

## 2019-05-24 NOTE — Progress Notes (Signed)
I called and spoke with patient's hospice nurse.  Patient is requiring frequent use of oxycodone for breakthrough pain.  Will increase dose of transdermal fentanyl 25 mcg every 72 hours.  Also liberalize dose of oxycodone for comfort.

## 2019-05-26 ENCOUNTER — Ambulatory Visit: Admission: RE | Admit: 2019-05-26 | Payer: Medicare Other | Source: Ambulatory Visit | Admitting: Radiation Oncology

## 2019-06-16 DEATH — deceased

## 2021-05-12 IMAGING — CT CT CHEST W/ CM
2 of 5 series · 11 of 36 positions shown, 13 images · IV contrast (omnipaque)
Comparison: 02/15/2019 CT abdomen/pelvis.  11/09/2018 PET-CT.

CLINICAL DATA: Stage IV pancreatic adenocarcinoma diagnosed October 2018 on palliative chemotherapy. Restaging.

EXAM:
CT CHEST, ABDOMEN, AND PELVIS WITH CONTRAST
TECHNIQUE: Multidetector CT imaging of the chest, abdomen and pelvis was
performed following the standard protocol during bolus
administration of intravenous contrast.
CONTRAST:  100mL OMNIPAQUE IOHEXOL 300 MG/ML  SOLN

[Series 2: cap with · axial · 0.71mm/px · z∈[-627,-117]mm · 8 of 126 slices shown, 10 images]
[im 12/126  mediastinal]
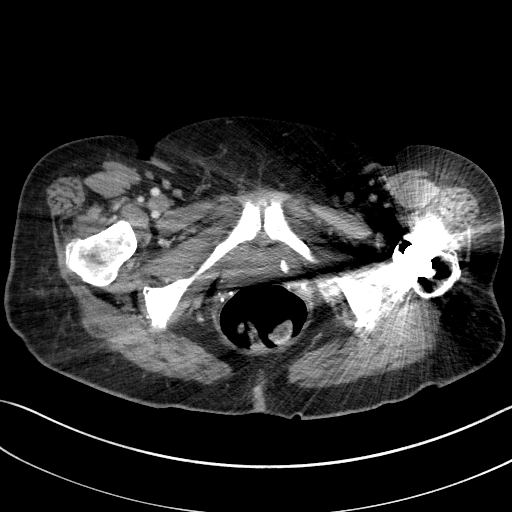
[im 12/126  lung]
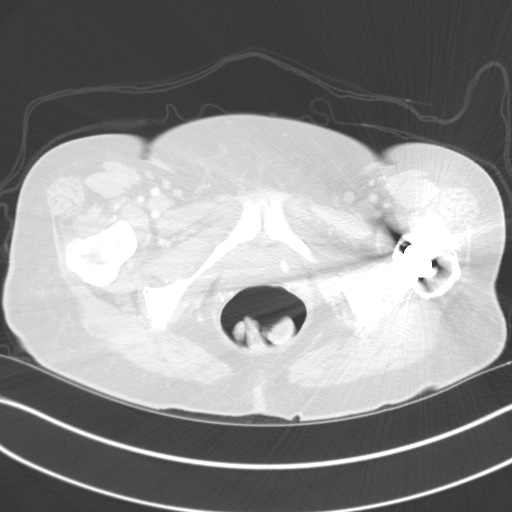
[im 23/126  lung]
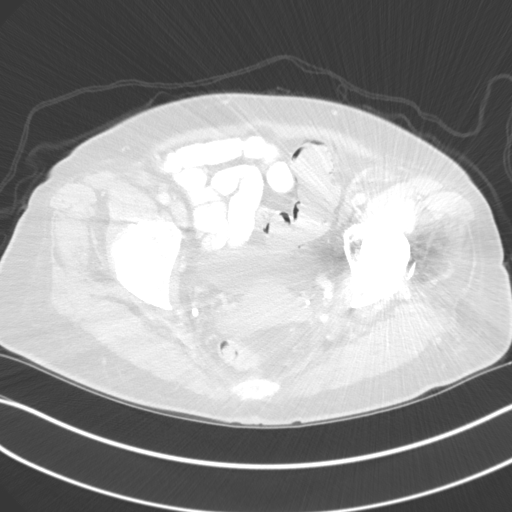
[im 46/126  lung]
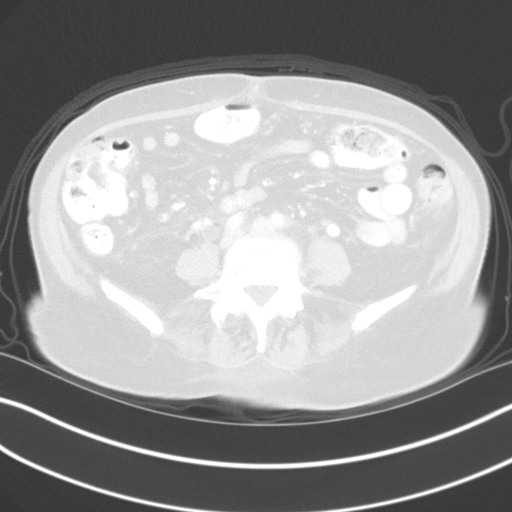
[im 57/126  lung]
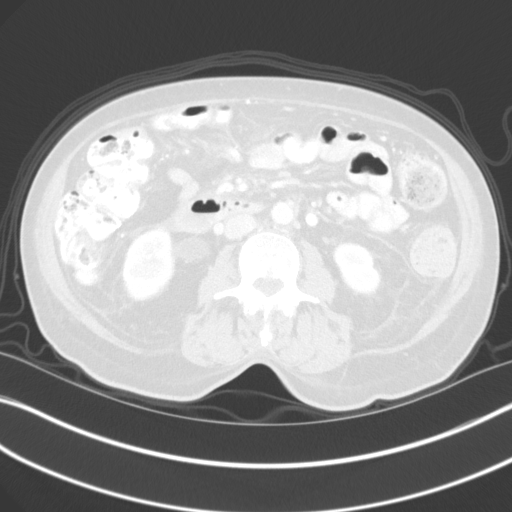
[im 69/126  mediastinal]
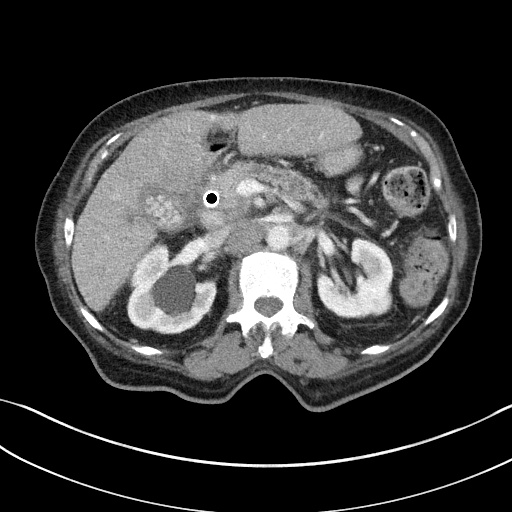
[im 69/126  lung]
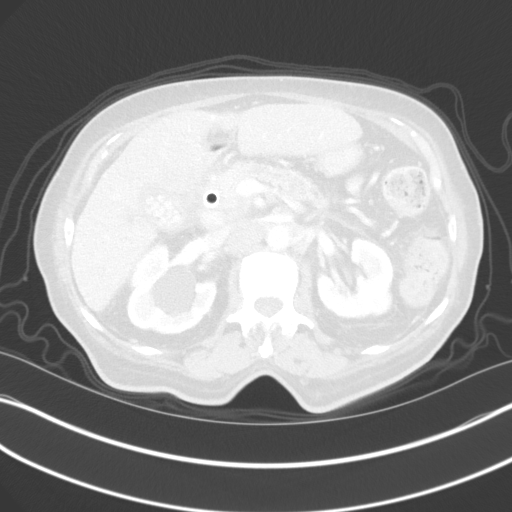
[im 80/126  lung]
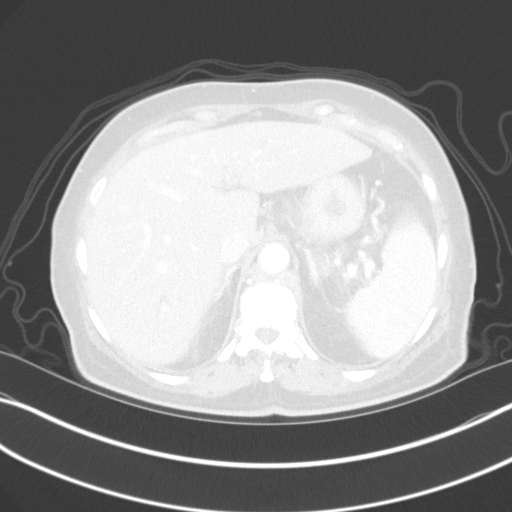
[im 103/126  lung]
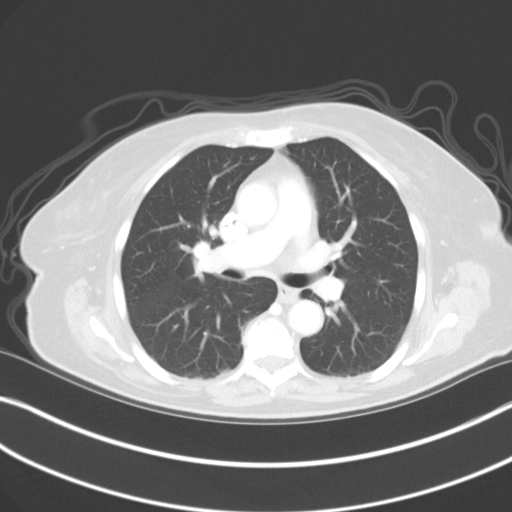
[im 114/126  lung]
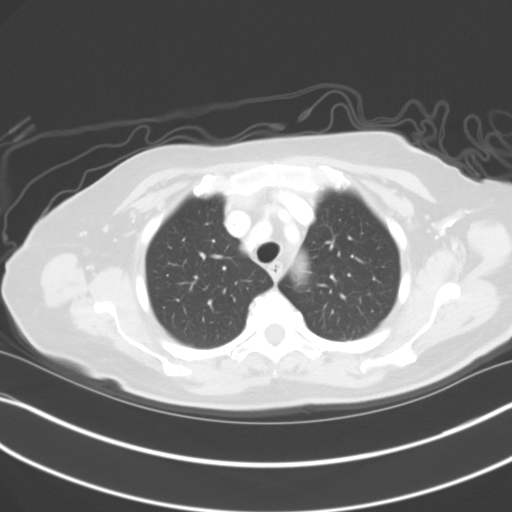

[Series 5: coronals · coronal · 0.82mm/px · 3 of 126 slices shown]
[im 26/126  lung]
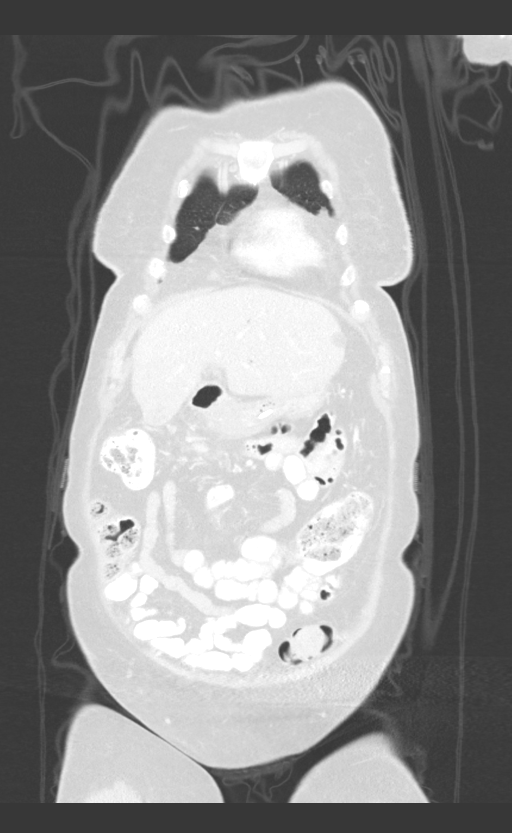
[im 51/126  lung]
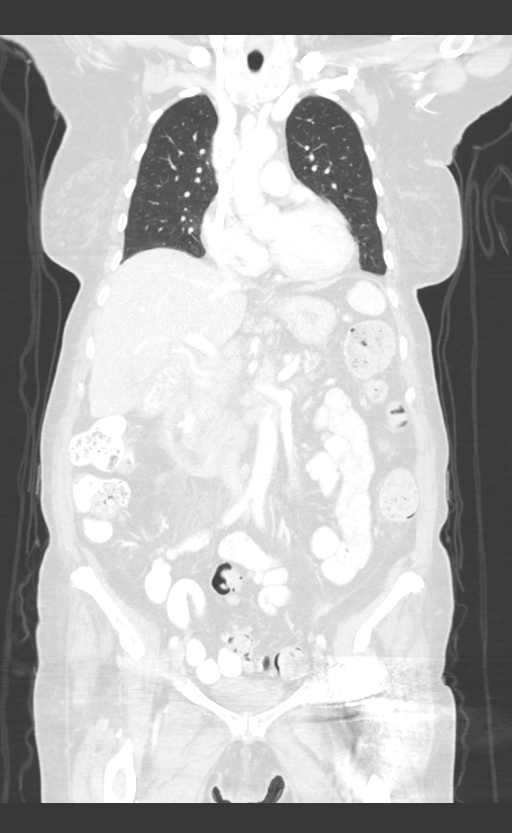
[im 76/126  lung]
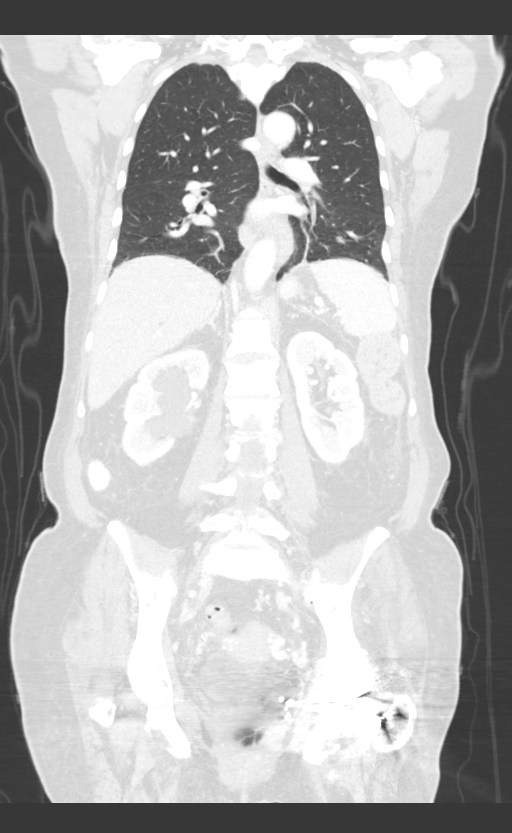

[11 of 36 positions shown; findings below may reference images not displayed]

FINDINGS: CT CHEST FINDINGS

Cardiovascular: Top-normal heart size. Right internal jugular
Port-A-Cath terminates at the cavoatrial junction. No significant
pericardial effusion/thickening. Atherosclerotic nonaneurysmal
thoracic aorta. Normal caliber pulmonary arteries. No central
pulmonary emboli.

Mediastinum/Nodes: Heterogeneous thyroid gland with subcentimeter
bilateral thyroid nodules requiring no follow-up. Unremarkable
esophagus. No axillary or hilar adenopathy. Mildly enlarged 1.0 cm
lower right paraesophageal node (series 2/image 34), stable since
11/09/2018 PET-CT. Left retrocrural enlarged 1.4 cm node (series
2/image 46), increased from 0.9 cm on 02/15/2019 CT abdomen. No
additional pathologically enlarged mediastinal nodes.

Lungs/Pleura: No pneumothorax. No pleural effusion. Anterior right
middle lobe 3 mm solid pulmonary nodule (series 4/image 80) is
stable since 11/09/2018 PET-CT. Mild platelike scarring versus
atelectasis at both lung bases. No acute consolidative airspace
disease, lung masses or new significant pulmonary nodules.

Musculoskeletal: Several new sclerotic lesions throughout thoracic
osseous structures including the sternum, bilateral ribs, left
superior scapula, left clavicle and thoracic spine, new since
11/09/2018 PET-CT. Moderate thoracic spondylosis.

CT ABDOMEN PELVIS FINDINGS

Hepatobiliary: Normal liver size. Simple lateral segment left liver
lobe 1.0 cm cyst is stable. Subcentimeter hypodense segment 4B left
liver lobe lesion is new (series 2/image 57). Subcentimeter
hypodense segment 6 right liver lesion (series 2/image 58) is
stable. Subcentimeter segment 8 right liver dome hypodense lesion
(series 2/image 39) is stable. No additional new liver lesions.
Numerous calcified gallstones throughout the gallbladder with stable
mild diffuse gallbladder wall thickening. Pneumobilia in the
intrahepatic left liver lobe bile ducts with stable mild central
intrahepatic biliary ductal dilatation. CBD stent well positioned
with distal tip in the duodenal lumen just beyond the ampulla.
Pneumobilia throughout the stent lumen.

Pancreas: Stable mild fullness of the pancreatic head with an
distinct surrounding soft tissue planes and no discrete measurable
pancreatic mass by CT. Stable mild diffuse pancreatic duct dilation.
No new pancreatic lesions.

Spleen: Normal size. No mass.

Adrenals/Urinary Tract: Normal adrenals. New moderate to severe
right hydroureteronephrosis to the level of the lower right lumbar
ureter without discrete obstructing stone or mass, although with
mild urothelial wall thickening at the site of caliber transition of
the right ureter (series 2/image 86). No left hydronephrosis. No
renal masses. Bladder nondistended and obscured by streak artifact
from left hip hardware.

Stomach/Bowel: Small hiatal hernia. Otherwise normal nondistended
stomach. Normal caliber small bowel with no small bowel wall
thickening. Normal appendix. Mild sigmoid diverticulosis, with no
large bowel wall thickening or acute pericolonic fat stranding.

Vascular/Lymphatic: Atherosclerotic nonaneurysmal abdominal aorta.
Patent portal, splenic, hepatic and renal veins. Stable mildly
enlarged 1.0 cm gastrohepatic ligament lymph node (series 2/image
48). Enlarged 1.4 cm porta hepatis node (series 2/image 51),
previously 1.3 cm on 02/15/2019 CT, not appreciably changed. Stable
enlarged 1.3 cm portacaval node (series 2/image 58). Enlarged 2.2 cm
aortocaval node (series 2/image 53), previously 1.9 cm, mildly
increased. Enlarged 1.7 cm left para-aortic node (series 2/image
60), previously 1.6 cm, not substantially changed. No pathologically
enlarged pelvic lymph nodes.

Reproductive: Grossly normal uterus.  No adnexal mass.

Other: No pneumoperitoneum, ascites or focal fluid collection. Upper
omental 1.3 cm soft tissue nodule (series 2/image 68), previously a
1.1 cm, slightly increased. Adjacent 0.7 cm upper omental soft
tissue nodule (series 2/image 67), previously 0.5 cm, slightly
increased. Small fat containing umbilical hernia.

Musculoskeletal: Several sclerotic lesions scattered throughout the
lumbar spine, upper sacrum, bilateral iliac bones and right pubic
rami are unchanged since 02/15/2019 CT. No new osseous lesions. Left
total hip arthroplasty. Mild lumbar spondylosis.
IMPRESSION: 1. Findings suggest mild progression of metastatic disease.
2. Left retrocrural and aortocaval lymphadenopathy is mildly
increased. Lower right paraesophageal, gastrohepatic ligament, porta
hepatis, portacaval and left para-aortic lymphadenopathy is stable.
3. Upper omental soft tissue nodules are slightly increased and
likely represent peritoneal metastases.
4. Three scattered subcentimeter hypodense liver lesions, one of
which is new and two of which are stable, suspicious for liver
metastases.
5. Stable fullness and heterogeneity of the pancreatic head with
indistinct soft tissue planes compatible with known primary
pancreatic head neoplasm. Well-positioned CBD stent with pneumobilia
indicating stent patency.
6. New moderate to severe right hydroureteronephrosis to the level
of the lower right lumbar ureter. No discrete stone or mass,
although there is mild urothelial wall thickening at the site of
caliber transition in the right ureter. Findings are indeterminate
for benign treatment related or malignant right ureteral stricture.
7. Sclerotic osseous metastases throughout the abdomen and pelvis
are unchanged since 02/15/2019 CT. Sclerotic osseous metastases
throughout the chest are new since most recent comparison chest
imaging of 11/09/2018 PET-CT, compatible with response to therapy.
8. Aortic Atherosclerosis ([id number]). Additional chronic findings
as detailed.
